# Patient Record
Sex: Female | Born: 1965 | Race: Black or African American | Hispanic: No | Smoking: Never smoker
Health system: Southern US, Community
[De-identification: ages and names within clinical notes are randomized; demographics above are authoritative.]

## PROBLEM LIST (undated history)

## (undated) DIAGNOSIS — J329 Chronic sinusitis, unspecified: Secondary | ICD-10-CM

## (undated) DIAGNOSIS — E079 Disorder of thyroid, unspecified: Secondary | ICD-10-CM

## (undated) DIAGNOSIS — K219 Gastro-esophageal reflux disease without esophagitis: Secondary | ICD-10-CM

## (undated) DIAGNOSIS — I6529 Occlusion and stenosis of unspecified carotid artery: Secondary | ICD-10-CM

## (undated) DIAGNOSIS — K449 Diaphragmatic hernia without obstruction or gangrene: Secondary | ICD-10-CM

## (undated) DIAGNOSIS — K7460 Unspecified cirrhosis of liver: Secondary | ICD-10-CM

## (undated) DIAGNOSIS — D649 Anemia, unspecified: Secondary | ICD-10-CM

## (undated) DIAGNOSIS — D571 Sickle-cell disease without crisis: Secondary | ICD-10-CM

## (undated) DIAGNOSIS — G4730 Sleep apnea, unspecified: Secondary | ICD-10-CM

## (undated) DIAGNOSIS — M1990 Unspecified osteoarthritis, unspecified site: Secondary | ICD-10-CM

## (undated) DIAGNOSIS — E559 Vitamin D deficiency, unspecified: Secondary | ICD-10-CM

## (undated) HISTORY — PX: LAPAROSCOPIC GASTRIC BAND REMOVAL WITH LAPAROSCOPIC GASTRIC SLEEVE RESECTION: SHX6498

## (undated) HISTORY — PX: ABDOMINAL HYSTERECTOMY: SHX81

## (undated) HISTORY — PX: THYROIDECTOMY: SHX17

## (undated) HISTORY — PX: LAPAROSCOPIC GASTRIC SLEEVE RESECTION: SHX5895

## (undated) HISTORY — DX: Gastro-esophageal reflux disease without esophagitis: K21.9

## (undated) HISTORY — PX: CHOLECYSTECTOMY: SHX55

## (undated) HISTORY — PX: SALPINGO OOPHORECTOMY: SHX510126

## (undated) HISTORY — DX: Sickle-cell disease without crisis: D57.1

## (undated) HISTORY — PX: LAPAROSCOPIC REVISION SLEEVE GASTRECTOMY TO GASTRIC BYPASS: SHX510386

## (undated) HISTORY — PX: LAPAROSCOPIC, GASTRECTOMY SLEEVE: SHX4495

---

## 2011-01-07 DIAGNOSIS — E0590 Thyrotoxicosis, unspecified without thyrotoxic crisis or storm: Secondary | ICD-10-CM

## 2011-01-07 HISTORY — DX: Thyrotoxicosis, unspecified without thyrotoxic crisis or storm: E05.90

## 2012-01-21 ENCOUNTER — Other Ambulatory Visit: Payer: Self-pay | Admitting: Family Medicine

## 2012-01-21 DIAGNOSIS — R1310 Dysphagia, unspecified: Secondary | ICD-10-CM

## 2012-01-28 ENCOUNTER — Ambulatory Visit
Admission: RE | Admit: 2012-01-28 | Discharge: 2012-01-28 | Disposition: A | Discharge status: Home / Self Care | Payer: BC Managed Care – PPO | Source: Ambulatory Visit | Attending: Family Medicine | Admitting: Family Medicine

## 2012-01-28 DIAGNOSIS — R1310 Dysphagia, unspecified: Secondary | ICD-10-CM

## 2012-02-09 ENCOUNTER — Encounter (HOSPITAL_BASED_OUTPATIENT_CLINIC_OR_DEPARTMENT_OTHER): Payer: Self-pay | Admitting: *Deleted

## 2012-02-09 ENCOUNTER — Emergency Department (HOSPITAL_BASED_OUTPATIENT_CLINIC_OR_DEPARTMENT_OTHER)
Admission: EM | Admit: 2012-02-09 | Discharge: 2012-02-09 | Disposition: A | Discharge status: Home / Self Care | Payer: BC Managed Care – PPO | Attending: Emergency Medicine | Admitting: Emergency Medicine

## 2012-02-09 DIAGNOSIS — R51 Headache: Secondary | ICD-10-CM | POA: Insufficient documentation

## 2012-02-09 DIAGNOSIS — Z79899 Other long term (current) drug therapy: Secondary | ICD-10-CM | POA: Insufficient documentation

## 2012-02-09 DIAGNOSIS — Z8709 Personal history of other diseases of the respiratory system: Secondary | ICD-10-CM | POA: Insufficient documentation

## 2012-02-09 DIAGNOSIS — E079 Disorder of thyroid, unspecified: Secondary | ICD-10-CM | POA: Insufficient documentation

## 2012-02-09 HISTORY — DX: Disorder of thyroid, unspecified: E07.9

## 2012-02-09 HISTORY — DX: Chronic sinusitis, unspecified: J32.9

## 2012-02-09 LAB — CBC WITH DIFFERENTIAL/PLATELET
Eosinophils Absolute: 0.1 10*3/uL (ref 0.0–0.7)
Eosinophils Relative: 1 % (ref 0–5)
Hemoglobin: 12.8 g/dL (ref 12.0–15.0)
Lymphocytes Relative: 37 % (ref 12–46)
Lymphs Abs: 2.1 10*3/uL (ref 0.7–4.0)
MCH: 30.6 pg (ref 26.0–34.0)
MCV: 90.2 fL (ref 78.0–100.0)
Monocytes Relative: 7 % (ref 3–12)
Platelets: 321 10*3/uL (ref 150–400)
RBC: 4.18 MIL/uL (ref 3.87–5.11)
WBC: 5.7 10*3/uL (ref 4.0–10.5)

## 2012-02-09 LAB — COMPREHENSIVE METABOLIC PANEL
ALT: 16 U/L (ref 0–35)
Alkaline Phosphatase: 57 U/L (ref 39–117)
BUN: 10 mg/dL (ref 6–23)
CO2: 25 mEq/L (ref 19–32)
Calcium: 8.9 mg/dL (ref 8.4–10.5)
GFR calc Af Amer: 88 mL/min — ABNORMAL LOW (ref >90)
GFR calc non Af Amer: 76 mL/min — ABNORMAL LOW (ref >90)
Glucose, Bld: 101 mg/dL — ABNORMAL HIGH (ref 70–99)
Potassium: 3.7 mEq/L (ref 3.5–5.1)
Sodium: 139 mEq/L (ref 135–145)

## 2012-02-09 MED ORDER — DEXAMETHASONE SODIUM PHOSPHATE 10 MG/ML IJ SOLN
10.0000 mg | Freq: Once | INTRAMUSCULAR | Status: AC
  Administered 2012-02-09: 10 mg via INTRAVENOUS
  Filled 2012-02-09: qty 1

## 2012-02-09 MED ORDER — METOCLOPRAMIDE HCL 5 MG/ML IJ SOLN
20.0000 mg | Freq: Once | INTRAVENOUS | Status: DC
  Filled 2012-02-09: qty 2

## 2012-02-09 MED ORDER — DIPHENHYDRAMINE HCL 50 MG/ML IJ SOLN
25.0000 mg | Freq: Once | INTRAMUSCULAR | Status: AC
  Administered 2012-02-09: 25 mg via INTRAVENOUS
  Filled 2012-02-09: qty 1

## 2012-02-09 MED ORDER — SODIUM CHLORIDE 0.9 % IV BOLUS (SEPSIS)
1000.0000 mL | Freq: Once | INTRAVENOUS | Status: AC
  Administered 2012-02-09: 1000 mL via INTRAVENOUS

## 2012-02-09 MED ORDER — METOCLOPRAMIDE HCL 5 MG/ML IJ SOLN
10.0000 mg | Freq: Once | INTRAMUSCULAR | Status: AC
  Administered 2012-02-09: 10 mg via INTRAVENOUS

## 2012-02-09 NOTE — ED Provider Notes (Signed)
History     CSN: [id number]  Arrival date & time 02/09/12  1043   First MD Initiated Contact with Patient 02/09/12 1144      Chief Complaint  Patient presents with  . Headache    (Consider location/radiation/quality/duration/timing/severity/associated sxs/prior treatment) HPI  This patient with a history of headaches, thyroid disease presents with ongoing headache.  This episode actually began several days ago, but over the past 12 hours has become severe, with no relief from aspirin, nor other OTC medication. She notes that there is a long history of headaches, in a semi-similar distribution, throbbing, left-sided, radiating from the neck superiorly.  Headaches have become more frequent, more severe since partial thyroidectomy last year. She denies confusion, disorientation, visual changes, vomiting.    Past Medical History  Diagnosis Date  . Sinusitis   . Thyroid disease     Past Surgical History  Procedure Date  . Thyroidectomy     No family history on file.  History  Substance Use Topics  . Smoking status: Never Smoker   . Smokeless tobacco: Not on file  . Alcohol Use: No    OB History    Grav Para Term Preterm Abortions TAB SAB Ect Mult Living                  Review of Systems  Constitutional:       Per HPI, otherwise negative  HENT:       Per HPI, otherwise negative  Eyes: Negative.   Respiratory:       Per HPI, otherwise negative  Cardiovascular:       Per HPI, otherwise negative  Gastrointestinal: Negative for vomiting.  Genitourinary: Negative.   Musculoskeletal:       Per HPI, otherwise negative  Skin: Negative.   Neurological: Positive for headaches. Negative for syncope and numbness.    Allergies  Vicodin  Home Medications   Current Outpatient Rx  Name  Route  Sig  Dispense  Refill  . SYNTHROID PO   Oral   Take by mouth.         Marland Kitchen PHENTERMINE HCL PO   Oral   Take by mouth.           BP 141/91  Pulse 61  Temp 98.4 F  (36.9 C) (Oral)  Resp 20  SpO2 100%  LMP 01/14/2012  Physical Exam  Nursing note and vitals reviewed. Constitutional: She is oriented to person, place, and time. She appears well-developed and well-nourished. No distress.  HENT:  Head: Normocephalic and atraumatic.       No appreciable erythema / crepitus / discharge about the neck.  Eyes: Conjunctivae normal and EOM are normal.  Cardiovascular: Normal rate and regular rhythm.   Pulmonary/Chest: Effort normal and breath sounds normal. No stridor. No respiratory distress.  Abdominal: She exhibits no distension.  Musculoskeletal: She exhibits no edema.  Neurological: She is alert and oriented to person, place, and time. She displays no atrophy and no tremor. No cranial nerve deficit. She exhibits normal muscle tone. She displays no seizure activity. Coordination normal.  Skin: Skin is warm and dry.  Psychiatric: She has a normal mood and affect.    ED Course  Procedures (including critical care time)   Labs Reviewed  CBC WITH DIFFERENTIAL  COMPREHENSIVE METABOLIC PANEL   No results found.   No diagnosis found.  1:22 PM Patient substantially better. 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  MDM  This patient presents with headache.  Notably, the patient's long history of headaches, and on exam has no neurologic dysfunction, stable vital signs.  Given her history, the significant improvement following ED interventions, she was stable for discharge.  We discussed the need for both primary care followup and the need to specifically identify what her surgical procedure was, with some consideration of ongoing hormone replacement needs that have not been met.  Gerhard Munch, MD 02/09/12 1322

## 2012-02-09 NOTE — ED Notes (Signed)
She woke with a headache at 3am. Was able to go back to sleep. 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 Hx of sinusitis.

## 2012-08-23 DIAGNOSIS — E039 Hypothyroidism, unspecified: Secondary | ICD-10-CM | POA: Insufficient documentation

## 2012-09-14 DIAGNOSIS — J309 Allergic rhinitis, unspecified: Secondary | ICD-10-CM | POA: Insufficient documentation

## 2013-08-10 ENCOUNTER — Ambulatory Visit (INDEPENDENT_AMBULATORY_CARE_PROVIDER_SITE_OTHER): Payer: BC Managed Care – PPO

## 2013-08-10 ENCOUNTER — Encounter: Payer: Self-pay | Admitting: Podiatrist

## 2013-08-10 ENCOUNTER — Ambulatory Visit (INDEPENDENT_AMBULATORY_CARE_PROVIDER_SITE_OTHER): Payer: BC Managed Care – PPO | Admitting: Podiatrist

## 2013-08-10 DIAGNOSIS — M25879 Other specified joint disorders, unspecified ankle and foot: Secondary | ICD-10-CM

## 2013-08-10 DIAGNOSIS — M7150 Other bursitis, not elsewhere classified, unspecified site: Secondary | ICD-10-CM

## 2013-08-10 DIAGNOSIS — R52 Pain, unspecified: Secondary | ICD-10-CM

## 2013-08-10 DIAGNOSIS — D219 Benign neoplasm of connective and other soft tissue, unspecified: Secondary | ICD-10-CM

## 2013-08-10 DIAGNOSIS — D3610 Benign neoplasm of peripheral nerves and autonomic nervous system, unspecified: Secondary | ICD-10-CM

## 2013-08-10 DIAGNOSIS — M21619 Bunion of unspecified foot: Secondary | ICD-10-CM

## 2013-08-10 DIAGNOSIS — Q6650 Congenital pes planus, unspecified foot: Secondary | ICD-10-CM

## 2013-08-10 DIAGNOSIS — G5750 Tarsal tunnel syndrome, unspecified lower limb: Secondary | ICD-10-CM

## 2013-08-10 DIAGNOSIS — G5752 Tarsal tunnel syndrome, left lower limb: Secondary | ICD-10-CM

## 2013-08-10 MED ORDER — TRIAMCINOLONE ACETONIDE 10 MG/ML IJ SUSP
10.0000 mg | Freq: Once | INTRAMUSCULAR | Status: AC
  Administered 2013-08-10: 10 mg

## 2013-08-10 NOTE — Progress Notes (Signed)
   Subjective:    Patient ID: Lisa Hughes, female    DOB: 09-08-1965, 48 y.o.   MRN: [id number]  HPI my left foot has been bothering me for about 8 years and is sore and comes and goes and walks a lot and runs about 5-7 miles and hurts on the ball and pain going up leg and throbbing and tingling  Patient also complains of pain submetatarsal 5 of the left foot., Generalized numbness in the foot and going up the left leg.    Review of Systems  HENT: Positive for sinus pressure.   Cardiovascular:       Poor circulation and calf pain  Skin:       Change in nails   Neurological: Positive for headaches.  All other systems reviewed and are negative.      Objective:   Physical Exam Patient is awake, alert, and oriented x 3.  In no acute distress.  Vascular status is intact with palpable pedal pulses at 2/4 DP and PT bilateral and capillary refill time within normal limits. Neurological sensation is also intact bilaterally via Semmes Weinstein monofilament at 5/5 sites. Light touch, vibratory sensation, Achilles tendon reflex is intact. Numbness and tingling are subjectively reported on the dorsal lateral aspect of the left foot and radiating up into the anterior leg. Dermatological exam reveals a palpable cyst/bursa on the plantar aspect of the left fifth metatarsal head.  Musculature intact with dorsiflexion, plantarflexion, inversion, eversion. 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      Assessment & Plan:  Cyst, bursa submetatarsal 5 left, neuritis, pes planovalgus  Plan: Injected the cyst with Kenalog and Marcaine plain. Recommended orthotics and she was scanned at today's visit. She will call if she continues to have pain from the cyst. If the orthotics don't help with the neuritis type pain we'll consider I nerve conduction study or a referral to Dr. Jacklynn Ganong

## 2013-08-10 NOTE — Patient Instructions (Signed)
Bursitis Bursitis is when the fluid-filled sac (bursa) that covers and protects a joint gets puffy and irritated. HOME CARE  Put ice on the area.  Put ice in a plastic bag.  Place a towel between your skin and the bag.  Leave the ice on for 15-20 minutes, 03-04 times a day.  Put the joint through a full range of motion 4 times a day. Rest the injured joint at other times. When you have less pain, begin slow movements and usual activities.  Only take medicine as told by your doctor.  Follow up with your doctor. Any delay in care could stop the bursitis from healing. This could cause long-term pain. GET HELP RIGHT AWAY IF:   You have more pain with treatment.  You have a temperature by mouth above 102 F (38.9 C), not controlled by medicine.  You have heat and irritation over the fluid-filled sac. MAKE SURE YOU:   Understand these instructions.  Will watch your condition.  Will get help right away if you are not doing well or get worse. Document Released: 06/12/2009 Document Revised: 03/17/2011 Document Reviewed: 03/14/2013 [facility] Patient Information 2015 [place], [place]. This information is not intended to replace advice given to you by your health care provider. Make sure you discuss any questions you have with your health care provider.   Tarsal Tunnel Syndrome with Rehab Tarsal tunnel syndrome is a condition that involves pressure (compression) on the nerve in the ankle (posterior tibial nerve) and results in pain and loss of feeling on the bottom of the foot. The nerve is usually compressed by other structures within the ankle. SYMPTOMS   Signs of nerve damage: pain, numbness, tingling, and loss of feeling along the bottom of the foot.  Pain that worsens with activity.  Feeling a lack of stability in the ankle. CAUSES  Tarsal tunnel syndrome is caused by structures within the ankle placing pressure on the nerve inside the ankle, which causes sensations in the  bottom of the foot. Common sources of pressure include:  Ligament-like tissue (retinaculum) that covers the nerve area in the ankle (tarsal tunnel).  Bony spurs or bumps.  Inflamed tendons (tendonitis). RISK INCREASES WITH:  Stretched ankle ligaments, which create a loose joint.  Flat feet.  Arthritis of the ankle.  Inflammation of tendons in the foot and ankle.  Previous foot or ankle injury. PREVENTION  Warm up and stretch properly before activity.  Maintain physical fitness:  Strength, flexibility, and endurance.  Cardiovascular fitness (increases heart rate).  Wear properly fitted shoes.  Wear arch supports (orthotics), if you have flat feet.  Protect the ankle with taping, braces, or compression bandages. PROGNOSIS  If treated properly, the symptoms of tarsal tunnel syndrome usually go away with non-surgical treatment. Occasionally, surgery is necessary to free the compressed nerve.  RELATED COMPLICATIONS  Permanent nerve damage, including pain, numbness, tingling, or weakness in the ankle. TREATMENT Treatment first involves resting from any activities that aggravate the symptoms, as well as the use of ice and medicine to reduce pain and inflammation. The use of strengthening and stretching exercises may help reduce pain from activity. Other treatments include wearing arch supports, if you have flat feet, and cross training (training in various physical activities) to reduce stress on the foot and ankle. If symptoms persist, despite non-surgical treatment, then surgery may be recommended. Surgery usually provides full relief from symptoms.  MEDICATION   If pain medicine is necessary, nonsteroidal anti-inflammatory medicines (aspirin and ibuprofen), or other minor  pain relievers (acetaminophen), are often recommended.  Do not take pain medication for 7 days before surgery.  Prescription pain relievers may be given if your caregiver thinks they are necessary. Use only  as directed and only as much as you need. HEAT AND COLD  Cold treatment (icing) relieves pain and reduces inflammation. Cold treatment should be applied for 10 to 15 minutes every 2 to 3 hours, and immediately after any activity that aggravates your symptoms. Use ice packs or an ice massage.  Heat treatment may be used prior to performing stretching and strengthening activities prescribed by your caregiver, physical therapist, or athletic trainer. Use a heat pack or a warm water soak. SEEK MEDICAL CARE IF:  Treatment does not seem to help, or the condition worsens.  Any medicines produce negative side effects.  Any complications from surgery occur:  Pain, numbness, or coldness in the affected foot.  Discoloration beneath the toenails (blue or gray) of the affected foot.  Signs of infections (fever, pain, inflammation, redness, or persistent bleeding). EXERCISES RANGE OF MOTION (ROM) AND STRETCHING EXERCISES - Tarsal Tunnel Syndrome (Posterior Tibial Nerve Compression) These exercises may help you when beginning to restore activity to your injured foot. Complete these exercises with caution. Nerves are very sensitive tissue. They must be exercised gently. Never force a motion and do not push through discomfort. Notify your caregiver of any exercises which increase your pain or worsen your symptoms. Your symptoms may go away with or without further involvement from your physician, physical therapist or athletic trainer. While completing these exercises, remember:   Restoring tissue flexibility helps normal motion to return to the joints. This allows healthier, less painful movement and activity.  An effective stretch should be held for at least 30 seconds.  A stretch should never be painful. You should only feel a gentle lengthening or release in the stretched tissue. STRETCH - Gastroc, Standing  Place hands on wall.  Extend right / left leg behind you and place a folded washcloth under  the arch of your foot for support. Keep the front knee somewhat bent.  Slightly point your toes inward on your back foot.  Keeping your right / left heel on the floor and your knee straight, shift your weight toward the wall, not allowing your back to arch.  You should feel a gentle stretch in the right / left calf. Hold this position for __________ seconds. Repeat __________ times. Complete this stretch __________ times per day. STRETCH - Soleus, Standing  Place hands on wall.  Extend right / left leg behind you and place a folded washcloth under the arch of your foot for support. Keep the front knee somewhat bent.  Slightly point your toes inward on your back foot.  Keep your right / left heel on the floor, bend your back knee, and slightly shift your weight over the back leg so that you feel a gentle stretch deep in your back calf.  Hold this position for __________ seconds. Repeat __________ times. Complete this stretch __________ times per day. RANGE OF MOTION - Toe Extension, Flexion  Sit with your right / left leg crossed over your opposite knee.  Grasp your toes and gently pull them back toward the top of your foot. You should feel a stretch on the bottom of your toes and foot.  Hold this stretch for __________ seconds.  Now, gently pull your toes toward the bottom of your foot. You should feel a stretch on the top of your toes  and foot.  Hold this stretch for __________ seconds. Repeat __________ times. Complete this stretch __________ times per day.  RANGE OF MOTION - Ankle Eversion  Sit with your right / left ankle crossed over your opposite knee.  Grip your foot with your opposite hand, placing your thumb on the top of your foot and your fingers across the bottom of your foot.  Gently push your foot downward with a slight rotation so your littlest toes rise slightly toward the ceiling.  You should feel a gentle stretch on the inside of your ankle. Hold the stretch  for __________ seconds. Repeat __________ times. Complete this exercise __________ times per day.  RANGE OF MOTION - Ankle Dorsiflexion, Active Assisted  Remove shoes and sit on a chair, preferably not on a carpeted surface.  Place right / left foot on the floor, directly under knee. Extend your opposite leg for support.  Keeping your heel down, slide your right / left foot back toward the chair until you feel a stretch at your ankle or calf. If you do not feel a stretch, slide your bottom forward to the edge of the chair while still keeping your heel down.  Hold this stretch for __________ seconds. Repeat __________ times. Complete this stretch __________ times per day.  STRETCH - Hamstrings, Supine  Lie on your back. Loop a belt or towel over the ball of your right / left foot.  Straighten your right / left knee and slowly pull on the belt to raise your leg. Do not allow the right / left knee to bend. Keep your opposite leg flat on the floor.  Raise the leg until you feel a gentle stretch behind your right / left knee or thigh. Hold this position for __________ seconds. Repeat __________ times. Complete this stretch __________ times per day.  STRETCH - Hamstrings, Doorway  Lie on your back with your right / left leg extended and resting on the wall and the opposite leg flat on the ground through the door. Initially, position your bottom farther away from the wall than the illustration shows.  Keep your right / left knee straight. If you feel a stretch behind your knee or thigh, hold this position for __________ seconds.  If you do not feel a stretch, scoot your bottom closer to the door and hold __________ seconds. Repeat __________ times. Complete this stretch __________ times per day.  STRETCH - Hamstrings, Standing  Stand or sit, and extend your right / left leg, placing your foot on a chair or foot stool  Keep a slight arch in your low back, and keep your hips straight  forward.  Lead with your chest and lean forward at the waist, until you feel a gentle stretch in the back of your right / left knee or thigh. (When done correctly, this exercise requires leaning only a small distance.)  Hold this position for __________ seconds. Repeat __________ times. Complete this stretch __________ times per day. STRENGTHENING EXERCISES - Tarsal Tunnel Syndrome (Posterior Tibial Nerve Compression) These exercises may help you when beginning to restore activity to your injured foot. Your symptoms may go away with or without further involvement from your physician, physical therapist or athletic trainer. While completing these exercises, remember:   Muscles can gain both the endurance and the strength needed for everyday activities through controlled exercises.  Complete these exercises as instructed by your physician, physical therapist or athletic trainer. Increase the resistance and repetitions only as guided.  You may experience  muscle soreness or fatigue, but the pain or discomfort you are trying to eliminate should never worsen during these exercises. If this pain does worsen, stop and make certain you are following the directions exactly. If the pain is still present after adjustments, discontinue the exercise until you can discuss the trouble with your caregiver. STRENGTH - Dorsiflexors  Secure a rubber exercise band or tubing to a fixed object (table, pole) and loop the other end around your right / left foot.  Sit on the floor facing the fixed object. The band should be slightly tense when your foot is relaxed.  Slowly draw your foot back toward you, using your ankle and toes.  Hold this position for __________ seconds. Slowly release the tension in the band and return your foot to the starting position. Repeat __________ times. Complete this exercise __________ times per day.  STRENGTH - Plantar-flexors  Sit with your right / left leg extended. Holding onto  both ends of a rubber exercise band or tubing, loop it around the ball of your foot. Keep a slight tension in the band.  Slowly push your toes away from you, pointing them downward.  Hold this position for __________ seconds. Return to the starting position slowly, controlling the tension in the band. Repeat __________ times. Complete this exercise __________ times per day.  STRENGTH - Plantar-flexors, Standing  Stand with your feet shoulder width apart. Place your hands on a wall or table to steady yourself, using as little support as needed.  Keeping your weight evenly spread over the width of your feet, rise up on your toes.*  Hold this position for __________ seconds. Repeat __________ times. Complete this exercise __________ times per day.  *If this is too easy, shift your weight toward your right / left leg until you feel challenged. Ultimately, you may be asked to do this exercise while standing on your right / left foot only. STRENGTH - Towel Curls  Sit in a chair, on a non-carpeted surface.  Place your foot on a towel, keeping your heel on the floor.  Pull the towel toward your heel only by curling your toes. Keep your heel on the floor.  If instructed by your physician, physical therapist or athletic trainer, add ____________________ at the end of the towel. Repeat __________ times. Complete this exercise __________ times per day. STRENGTH - Ankle Eversion  Secure one end of a rubber exercise band or tubing to a fixed object (table, pole). Loop the other end around your foot, just before your toes.  Place your fists between your knees. This will focus your strengthening at your ankle.  Drawing the band across your opposite foot, away from the pole, slowly pull your little toe out and up. Make sure the band is positioned to resist the entire motion.  Hold this position for __________ seconds.  Return to the starting position slowly, controlling the tension in the  band. Repeat __________ times. Complete this exercise __________ times per day.  STRENGTH - Ankle Inversion  Secure one end of a rubber exercise band or tubing to a fixed object (table, pole). Loop the other end around your foot, just before your toes.  Place your fists between your knees. This will focus your strengthening at your ankle.  Slowly, pull your big toe up and in, making sure the band is positioned to resist the entire motion.  Hold this position for __________ seconds.  Return to the starting position slowly, controlling the tension in the band. Repeat  __________ times. Complete this exercises __________ times per day.  Document Released: 12/23/2004 Document Revised: 03/17/2011 Document Reviewed: 04/06/2008 [facility] Patient Information 2015 [place], [place]. This information is not intended to replace advice given to you by your health care provider. Make sure you discuss any questions you have with your health care provider.

## 2013-09-02 ENCOUNTER — Other Ambulatory Visit: Payer: BC Managed Care – PPO

## 2013-09-09 ENCOUNTER — Ambulatory Visit: Payer: BC Managed Care – PPO

## 2013-09-09 DIAGNOSIS — M722 Plantar fascial fibromatosis: Secondary | ICD-10-CM

## 2013-09-09 DIAGNOSIS — M7150 Other bursitis, not elsewhere classified, unspecified site: Secondary | ICD-10-CM

## 2013-09-09 NOTE — Progress Notes (Signed)
Pt is here to [facility] 

## 2013-09-09 NOTE — Patient Instructions (Addendum)

## 2013-10-26 ENCOUNTER — Ambulatory Visit (INDEPENDENT_AMBULATORY_CARE_PROVIDER_SITE_OTHER): Payer: BC Managed Care – PPO | Admitting: Podiatrist

## 2013-10-26 ENCOUNTER — Encounter: Payer: Self-pay | Admitting: Podiatrist

## 2013-10-26 VITALS — BP 142/89 | HR 64 | Resp 12

## 2013-10-26 DIAGNOSIS — M67479 Ganglion, unspecified ankle and foot: Secondary | ICD-10-CM

## 2013-10-26 DIAGNOSIS — M71372 Other bursal cyst, left ankle and foot: Secondary | ICD-10-CM

## 2013-10-26 MED ORDER — KETOCONAZOLE 2 % EX CREA: 1.0000 "application " | TOPICAL_CREAM | Freq: Two times a day (BID) | CUTANEOUS | Status: DC

## 2013-10-26 NOTE — Patient Instructions (Signed)

## 2013-10-27 NOTE — Progress Notes (Signed)
Subjective: Patient presents today for continued pain submetatarsal 5 left foot due to a cyst. She states that the last injection was of no benefit and that the cyst continues to get larger. She relates it's painful in shoes.  Objective: Neurovascular status intact and unchanged. Palpable cyst approximately dime-sized on the plantar aspect of the fifth metatarsal head is palpated. Pain with direct pressure is noted.  Assessment: Cyst plantar aspect left fifth metatarsal head  Plan: Discussed conservative versus surgical excision of the cyst. Agreed to try 1 more injection into the cyst itself to try and shrink it down. Discussed if this is not beneficial would recommend removal of the cyst as an outpatient procedure. She will call if the cyst continues to bother her and we will consider surgery at that time.

## 2013-11-01 IMAGING — RF DG ESOPHAGUS
11 series · 19 of 19 positions shown · non-contrast
Comparison: None.

CLINICAL DATA: Difficulty swallowing and pain after recent surgery.

ESOPHOGRAM/BARIUM SWALLOW
TECHNIQUE: Combined double contrast and single contrast
examination performed using effervescent crystals, thick barium
liquid, and thin barium liquid.
Fluoroscopy time:  1.0 minutes.

[Series 1: run · 9 of 9 slices shown (1 of 11)]
[im 1/9]
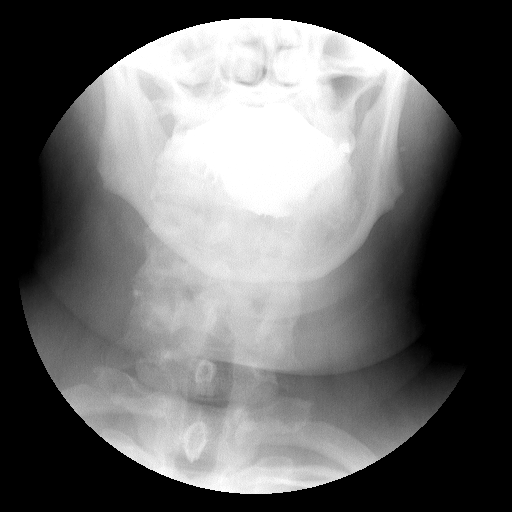
[im 2/9]
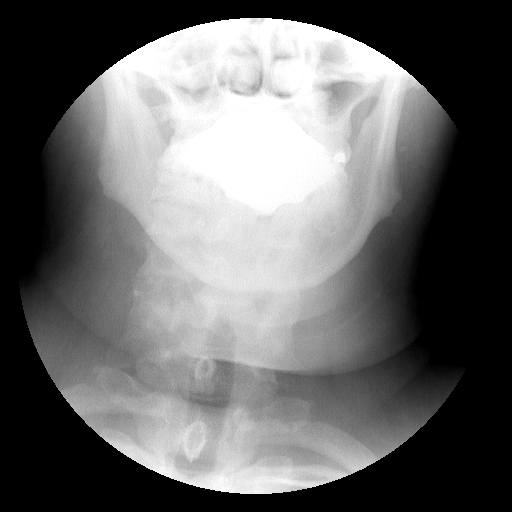
[im 3/9]
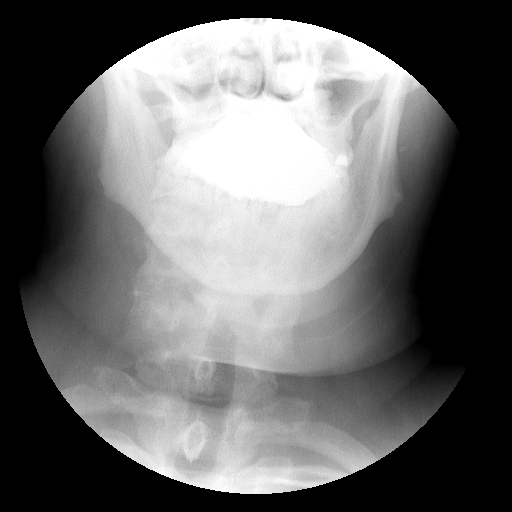
[im 4/9]
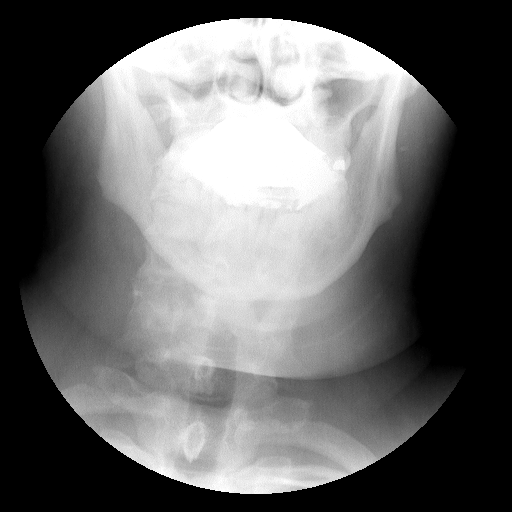
[im 5/9]
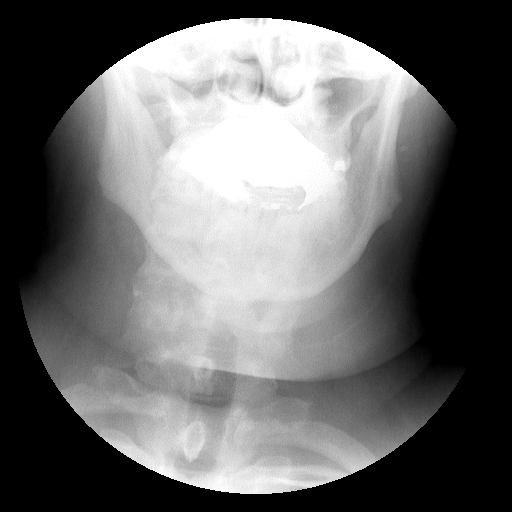
[im 6/9]
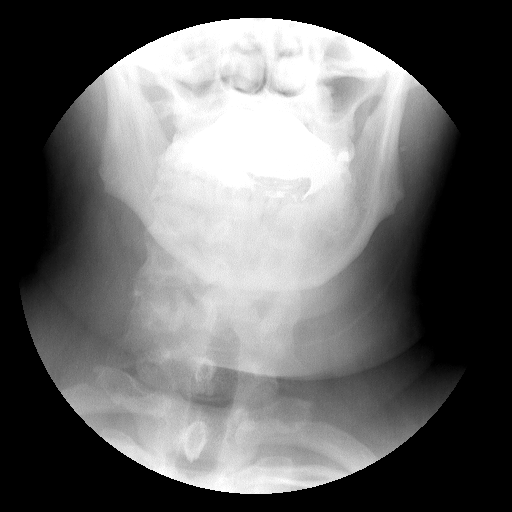
[im 7/9]
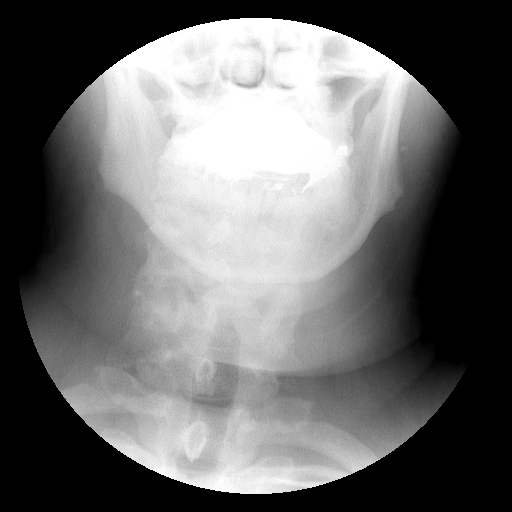
[im 8/9]
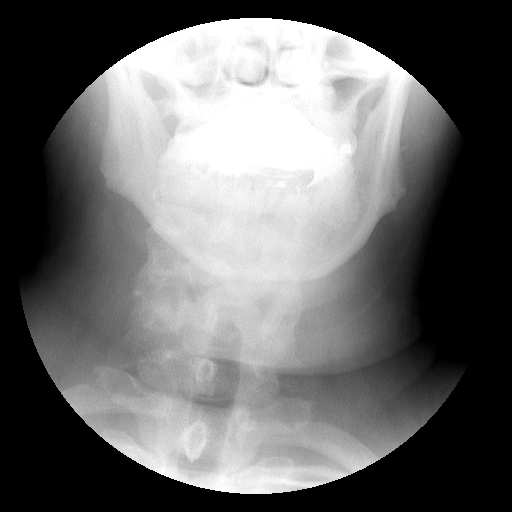
[im 9/9]
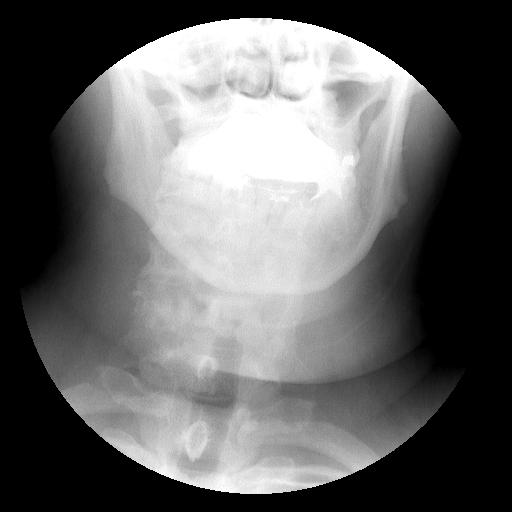

[Series 2: run · 1 of 1 slices shown (2 of 11)]
[im 1/1]
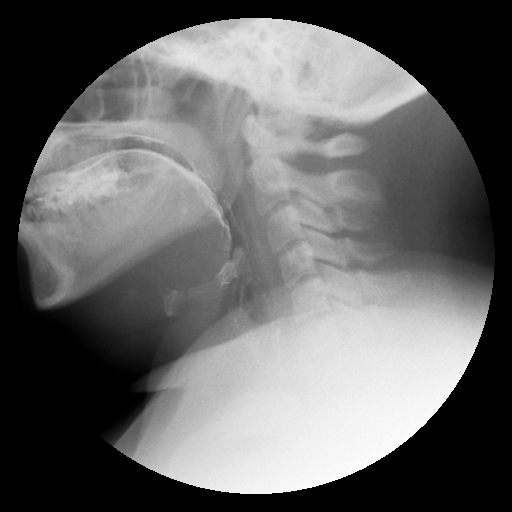

[Series 3: run · 1 of 1 slices shown (3 of 11)]
[im 1/1]
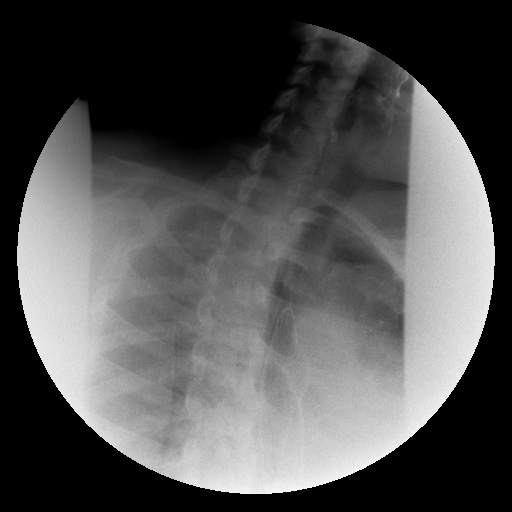

[Series 4: run · 1 of 1 slices shown (4 of 11)]
[im 1/1]
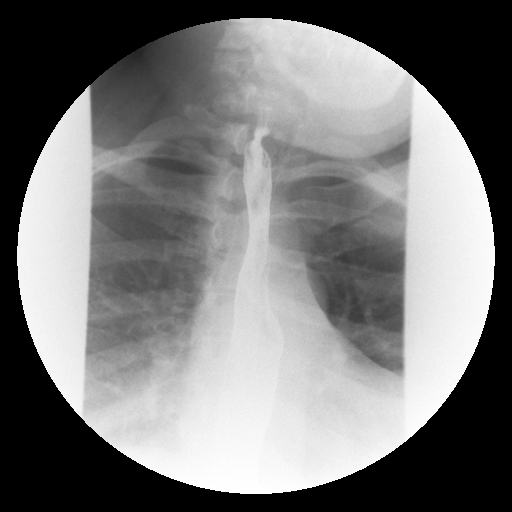

[Series 5: run · 1 of 1 slices shown (5 of 11)]
[im 1/1]
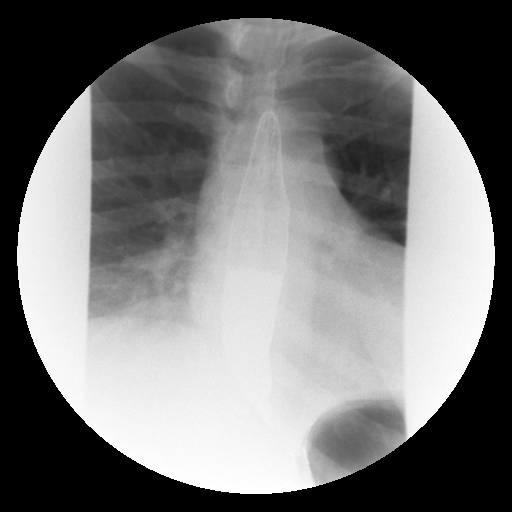

[Series 6: run · 1 of 1 slices shown (6 of 11)]
[im 1/1]
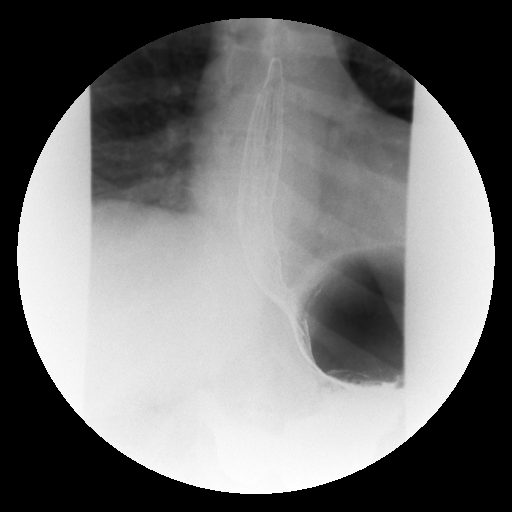

[Series 7: run · 1 of 1 slices shown (7 of 11)]
[im 1/1]
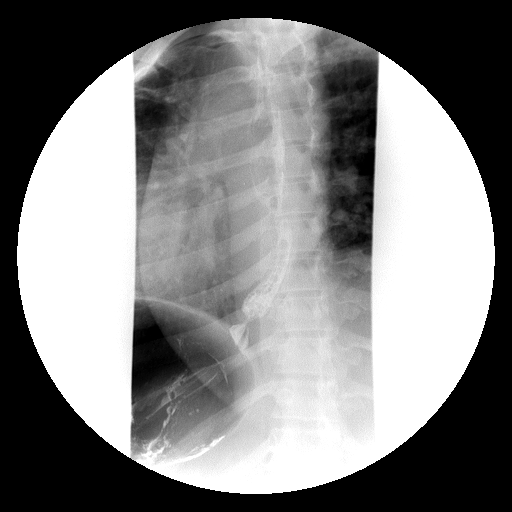

[Series 8: run · 1 of 1 slices shown (8 of 11)]
[im 1/1]
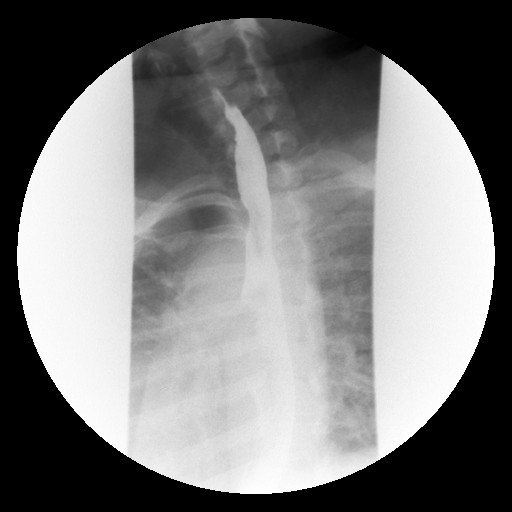

[Series 9: run · 1 of 1 slices shown (9 of 11)]
[im 1/1]
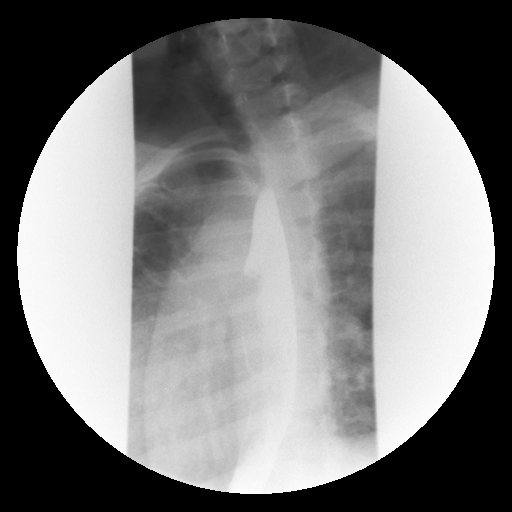

[Series 10: run · 1 of 1 slices shown (10 of 11)]
[im 1/1]
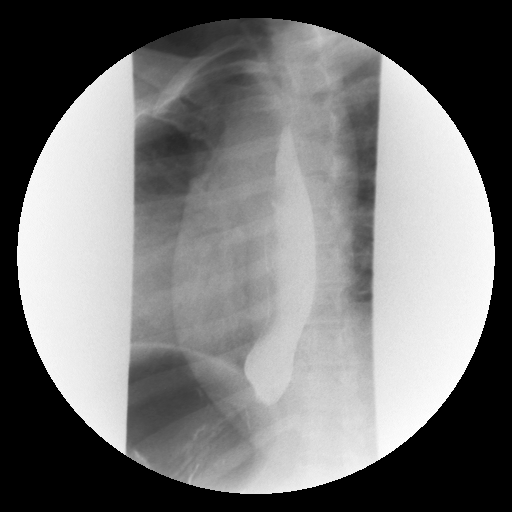

[Series 11: run · 1 of 1 slices shown (11 of 11)]
[im 1/1]
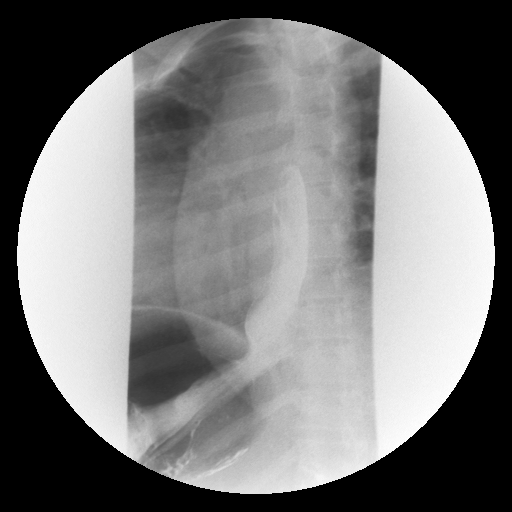

[19 of 19 positions shown; findings below may reference images not displayed]

FINDINGS: Real-time examination of swallowing was unremarkable
except for poor initiation of swallowing.  Esophageal motility is
normal.  No esophageal fold thickening, stricture or obstruction.
A 13 mm barium pill passed into the stomach without difficulty.
IMPRESSION: Poor initiation of swallowing.  Otherwise, negative exam.

## 2013-12-24 ENCOUNTER — Emergency Department (HOSPITAL_BASED_OUTPATIENT_CLINIC_OR_DEPARTMENT_OTHER)
Admission: EM | Admit: 2013-12-24 | Discharge: 2013-12-24 | Disposition: A | Discharge status: Home / Self Care | Payer: BC Managed Care – PPO | Attending: Emergency Medicine | Admitting: Emergency Medicine

## 2013-12-24 ENCOUNTER — Encounter (HOSPITAL_BASED_OUTPATIENT_CLINIC_OR_DEPARTMENT_OTHER): Payer: Self-pay | Admitting: *Deleted

## 2013-12-24 DIAGNOSIS — Z8719 Personal history of other diseases of the digestive system: Secondary | ICD-10-CM | POA: Diagnosis not present

## 2013-12-24 DIAGNOSIS — E079 Disorder of thyroid, unspecified: Secondary | ICD-10-CM | POA: Insufficient documentation

## 2013-12-24 DIAGNOSIS — Z8709 Personal history of other diseases of the respiratory system: Secondary | ICD-10-CM | POA: Insufficient documentation

## 2013-12-24 DIAGNOSIS — R519 Headache, unspecified: Secondary | ICD-10-CM

## 2013-12-24 DIAGNOSIS — R51 Headache: Secondary | ICD-10-CM | POA: Diagnosis not present

## 2013-12-24 MED ORDER — OXYCODONE-ACETAMINOPHEN 5-325 MG PO TABS: 1.0000 | ORAL_TABLET | ORAL | Status: DC | PRN

## 2013-12-24 MED ORDER — OXYCODONE-ACETAMINOPHEN 5-325 MG PO TABS
2.0000 | ORAL_TABLET | Freq: Once | ORAL | Status: AC
Start: 2013-12-24 — End: 2013-12-24
  Administered 2013-12-24: 2 via ORAL
  Filled 2013-12-24: qty 2

## 2013-12-24 NOTE — ED Provider Notes (Signed)
CSN: [id number]     Arrival date & time 12/24/13  1123 History   First MD Initiated Contact with Patient 12/24/13 1259     Chief Complaint  Patient presents with  . Headache     (Consider location/radiation/quality/duration/timing/severity/associated sxs/prior Treatment) HPI  48 year old female who presents today complaining of headache. She states that she has had headaches for extended period of time but they had resolved for a while and came back about 3 years ago. Today the past headache started while she was sleeping and was pounding in nature and severe. She has had a similar headache approximately year ago and sought evaluation that time. She states that normally her headaches resolve with over-the-counter pain medicine. Today she did not take any. She states that she had some low-grade fever with some sinus pressure yesterday. She denies any sinus discharge. She states when the headaches occur they're pounding and severe and she is unable to move due to the severe pain. She states this occurred last night and she was unable to move during the night but then was able to get up and come to the emergency department. She denies any lateralized weakness or changes in vision. She has not by been seen by neurology or evaluated for headaches except for ED visits in the past.  Past Medical History  Diagnosis Date  . Sinusitis   . Thyroid disease   . GERD (gastroesophageal reflux disease)    Past Surgical History  Procedure Laterality Date  . Thyroidectomy    . Abdominal hysterectomy    . Laparoscopic gastric sleeve resection     History reviewed. No pertinent family history. History  Substance Use Topics  . Smoking status: Never Smoker   . Smokeless tobacco: Not on file  . Alcohol Use: No   OB History    No data available     Review of Systems  All other systems reviewed and are negative.     Allergies  Vicodin  Home Medications   Prior to Admission medications    Medication Sig Start Date End Date Taking? Authorizing Provider  Levothyroxine Sodium (SYNTHROID PO) Take by mouth.    Historical Provider, MD  Phentermine-Topiramate (QSYMIA PO) Take by mouth.    Historical Provider, MD   BP 140/91 mmHg  Pulse 55  Temp(Src) 98.1 F (36.7 C) (Oral)  Resp 18  Ht 5\' 2"  (1.575 m)  Wt 180 lb (81.647 kg)  BMI 32.91 kg/m2  SpO2 100%  LMP 08/24/2013 Physical Exam  Constitutional: She is oriented to person, place, and time. She appears well-developed and well-nourished.  HENT:  Head: Normocephalic and atraumatic.  Right Ear: Tympanic membrane and external ear normal.  Left Ear: Tympanic membrane and external ear normal.  Nose: Nose normal. Right sinus exhibits no maxillary sinus tenderness and no frontal sinus tenderness. Left sinus exhibits no maxillary sinus tenderness and no frontal sinus tenderness.  Eyes: Conjunctivae and EOM are normal. Pupils are equal, round, and reactive to light. Right eye exhibits no nystagmus. Left eye exhibits no nystagmus.  Neck: Normal range of motion. Neck supple.  Cardiovascular: Normal rate, regular rhythm, normal heart sounds and intact distal pulses.   Pulmonary/Chest: Effort normal and breath sounds normal. No respiratory distress. She exhibits no tenderness.  Abdominal: Soft. Bowel sounds are normal. She exhibits no distension and no mass. There is no tenderness.  Musculoskeletal: Normal range of motion. She exhibits no edema or tenderness.  Neurological: She is alert and oriented to person, place, and time.  She has normal strength and normal reflexes. No sensory deficit. She displays a negative Romberg sign. GCS eye subscore is 4. GCS verbal subscore is 5. GCS motor subscore is 6.  Reflex Scores:      Tricep reflexes are 2+ on the right side and 2+ on the left side.      Bicep reflexes are 2+ on the right side and 2+ on the left side.      Brachioradialis reflexes are 2+ on the right side and 2+ on the left side.       Patellar reflexes are 2+ on the right side and 2+ on the left side.      Achilles reflexes are 2+ on the right side and 2+ on the left side. Patient with normal gait without ataxia, shuffling, spasm, or antalgia. Speech is normal without dysarthria, dysphasia, or aphasia. Muscle strength is 5/5 in bilateral shoulders, elbow flexor and extensors, wrist flexor and extensors, and intrinsic hand muscles. 5/5 bilateral lower extremity hip flexors, extensors, knee flexors and extensors, and ankle dorsi and plantar flexors.    Skin: Skin is warm and dry. No rash noted.  Psychiatric: She has a normal mood and affect. Her behavior is normal. Judgment and thought content normal.  Nursing note and vitals reviewed.   ED Course  Procedures (including critical care time) Labs Review Labs Reviewed - No data to display  Imaging Review No results found.   EKG Interpretation None      MDM   Final diagnoses:  Chronic nonintractable headache, unspecified headache type    Patient with headache which has been present intermittently for several years. Today is not worse than baseline. The patient risks benefits of CAT scan versus referral to neurology and orobable follow-up MRI. Patient will be given pain medicine here and given referral for neurology.  Shaune Pollack, MD 12/24/13 [phone]

## 2013-12-24 NOTE — ED Notes (Signed)
Pt reports HA since yesterday.  Hx of same.  Deneis N/V

## 2013-12-24 NOTE — Discharge Instructions (Signed)

## 2013-12-24 NOTE — ED Notes (Signed)
Pt given rx x 1 for percocet- pt has a ride

## 2014-04-19 ENCOUNTER — Ambulatory Visit (INDEPENDENT_AMBULATORY_CARE_PROVIDER_SITE_OTHER): Payer: BLUE CROSS/BLUE SHIELD | Admitting: Podiatrist

## 2014-04-19 ENCOUNTER — Ambulatory Visit: Payer: Self-pay

## 2014-04-19 ENCOUNTER — Encounter: Payer: Self-pay | Admitting: Podiatrist

## 2014-04-19 VITALS — BP 142/93 | HR 62 | Resp 12 | Ht 62.5 in | Wt 176.0 lb

## 2014-04-19 DIAGNOSIS — M79672 Pain in left foot: Secondary | ICD-10-CM

## 2014-04-19 DIAGNOSIS — M71372 Other bursal cyst, left ankle and foot: Secondary | ICD-10-CM

## 2014-04-19 NOTE — Progress Notes (Signed)
   Chief Complaint  Patient presents with  . Foot Pain    "My left foot is irritated to the point that it hurts and is red." left 5th sub MPJ plantar.  Pt states she was injected previously, but reoccurred more painful in the last 1.5 month.     Objective: Neurovascular status intact and unchanged. Pain with pressure on the plantar fifth metatarsal head is noted.  Skin discoloration in this area is also noted.  Mild redness is present.  No breakdown in skin is present.  No sign of infection noted.   Assessment: probable cyst plantar aspect left fifth metatarsal head  Plan: recommended an mri of the left foot to identify the cyst.  Will order the mri to plan for surgical excision if indicated.

## 2014-04-19 NOTE — Patient Instructions (Signed)
Will order an mri through [facility]-- will call as soon as it comes back to decide on surgical planning.

## 2014-04-19 NOTE — Progress Notes (Deleted)
   Subjective:    Patient ID: Lisa Hughes, female    DOB: [birth year], 49 y.o.   MRN: [id number]  HPI    Review of Systems  All other systems reviewed and are negative.      Objective:   Physical Exam        Assessment & Plan:

## 2014-04-20 ENCOUNTER — Other Ambulatory Visit: Payer: Self-pay | Admitting: Podiatrist

## 2014-04-20 ENCOUNTER — Other Ambulatory Visit: Payer: Self-pay | Admitting: *Deleted

## 2014-04-20 DIAGNOSIS — M71372 Other bursal cyst, left ankle and foot: Secondary | ICD-10-CM

## 2014-05-09 ENCOUNTER — Telehealth: Payer: Self-pay | Admitting: *Deleted

## 2014-05-09 NOTE — Telephone Encounter (Signed)
"  I received another order for patient's MRI.  If you look at appointment desk under orders you will see my notes.  Patient got a second opinion and decided MRI is not needed."  Okay, thank you.

## 2014-05-10 NOTE — Telephone Encounter (Signed)
gotcha-- thank you!

## 2015-01-07 DIAGNOSIS — K760 Fatty (change of) liver, not elsewhere classified: Secondary | ICD-10-CM

## 2015-01-07 HISTORY — DX: Fatty (change of) liver, not elsewhere classified: K76.0

## 2016-02-05 ENCOUNTER — Other Ambulatory Visit: Payer: Self-pay | Admitting: Family Medicine

## 2016-02-05 DIAGNOSIS — R748 Abnormal levels of other serum enzymes: Secondary | ICD-10-CM

## 2016-02-08 ENCOUNTER — Ambulatory Visit
Admission: RE | Admit: 2016-02-08 | Discharge: 2016-02-08 | Disposition: A | Discharge status: Home / Self Care | Payer: BLUE CROSS/BLUE SHIELD | Source: Ambulatory Visit | Attending: Family Medicine | Admitting: Family Medicine

## 2016-02-08 DIAGNOSIS — K760 Fatty (change of) liver, not elsewhere classified: Secondary | ICD-10-CM | POA: Insufficient documentation

## 2016-02-08 DIAGNOSIS — R748 Abnormal levels of other serum enzymes: Secondary | ICD-10-CM | POA: Diagnosis present

## 2016-02-11 ENCOUNTER — Ambulatory Visit: Payer: Self-pay

## 2016-03-07 ENCOUNTER — Ambulatory Visit: Payer: Self-pay

## 2016-03-10 ENCOUNTER — Ambulatory Visit: Payer: Self-pay

## 2016-03-17 ENCOUNTER — Encounter: Payer: BLUE CROSS/BLUE SHIELD | Admitting: Psychology

## 2016-03-27 ENCOUNTER — Encounter: Payer: BLUE CROSS/BLUE SHIELD | Attending: Psychology | Admitting: Psychology

## 2016-04-24 ENCOUNTER — Telehealth: Payer: Self-pay | Admitting: *Deleted

## 2016-04-24 NOTE — Telephone Encounter (Signed)
Pt states she stumped her toe and the toenail is coming off, and is scheduled for 04/28/2016. I spoke with pt and she wanted to know how to care for the toe until seen, she thought she may have a fungal infection and may begin to use the OTC antifungal, and kind of remembers an injury, but can't understand why the nail came off otherwise. Pt states she cut the nail back as far as she could. I told pt to hold off on a fungal treatment until established she has a fungus and some of the fungal preps can be irritating to the skin. Pt states she can't stand to have cold feet. I told pt that if there was bleeding she could perform espom salt 1/2 C to 1 qt of warm water daily and cover area with a lightly coated antibiotic ointment daily until seen, if no bleeding could cover with dry bandaid, wear open toed shoes with socks if concerned with cold or loose fitting shoes.

## 2016-04-28 ENCOUNTER — Ambulatory Visit: Payer: BLUE CROSS/BLUE SHIELD | Admitting: Podiatry

## 2016-04-28 ENCOUNTER — Ambulatory Visit (INDEPENDENT_AMBULATORY_CARE_PROVIDER_SITE_OTHER): Payer: BLUE CROSS/BLUE SHIELD | Admitting: Podiatry

## 2016-04-28 DIAGNOSIS — B351 Tinea unguium: Secondary | ICD-10-CM

## 2016-04-28 DIAGNOSIS — L601 Onycholysis: Secondary | ICD-10-CM

## 2016-05-01 NOTE — Progress Notes (Signed)
Subjective: 51 year old female presents the office today for concerns of her left big toenail partially coming off. She states that she is not having any pain, drainage or pus or any redness. She is out of the area checked however. She's never had any treatment for toenail fungus. She states that before the nail was not loose but she did head and part of the nail came off. She said no recent treatment otherwise. Denies any systemic complaints such as fevers, chills, nausea, vomiting. No acute changes since last appointment, and no other complaints at this time.   Objective: AAO x3, NAD DP/PT pulses palpable bilaterally, CRT less than 3 seconds Nails hypertrophic, dystrophic, discolored with yellow to [place] discoloration to all the nails. The digit toenails are the worst. The left hallux toenail is partially present on the partial nail border and his family adhered to the underlying nail bed. There is no swelling edema, erythema, drainage or pus. This note clinical signs of infection. There is no pain. No open lesions or pre-ulcerative lesions.  No pain with calf compression, swelling, warmth, erythema  Assessment: Left hallux onycholysis, onychomycosis  Plan: -All treatment options discussed with the patient including all alternatives, risks, complications.  -This time the left hallux is doing well and there is no signs of infection there is no pain. The toenails, but the remainder the nail is firmly adhered. We'll continue to monitor. As is not painful infected we will not remove the remainder the nail. Given the nail fungus discussed the treatment options and she is interested about this. After discussion we elected to proceed with topical treatment. I ordered a topical compound ointment through [facility] for this.  -Follow-up if symptoms are not improving in the next few months or sooner if the toenail causes any issues.  -Patient encouraged to call the office with any questions, concerns, change  in symptoms.   Celesta Gentile, DPM

## 2016-05-05 ENCOUNTER — Ambulatory Visit: Payer: BLUE CROSS/BLUE SHIELD | Admitting: Podiatry

## 2016-05-13 ENCOUNTER — Telehealth: Payer: Self-pay | Admitting: *Deleted

## 2016-05-13 MED ORDER — NONFORMULARY OR COMPOUNDED ITEM: 2 refills | Status: AC

## 2016-05-13 NOTE — Telephone Encounter (Signed)
Dr. Wagoner ordered [facility] Onychomycosis Nail Lacquer. Orders faxed to [facility]. 

## 2016-12-06 DIAGNOSIS — R197 Diarrhea, unspecified: Secondary | ICD-10-CM

## 2016-12-06 HISTORY — DX: Diarrhea, unspecified: R19.7

## 2017-03-06 ENCOUNTER — Encounter (HOSPITAL_BASED_OUTPATIENT_CLINIC_OR_DEPARTMENT_OTHER): Payer: Self-pay | Admitting: Emergency Medicine

## 2017-03-06 ENCOUNTER — Emergency Department (HOSPITAL_BASED_OUTPATIENT_CLINIC_OR_DEPARTMENT_OTHER)
Admission: EM | Admit: 2017-03-06 | Discharge: 2017-03-06 | Disposition: A | Discharge status: Home / Self Care | Payer: BLUE CROSS/BLUE SHIELD | Attending: Emergency Medicine | Admitting: Emergency Medicine

## 2017-03-06 ENCOUNTER — Other Ambulatory Visit: Payer: Self-pay

## 2017-03-06 DIAGNOSIS — R109 Unspecified abdominal pain: Secondary | ICD-10-CM | POA: Diagnosis not present

## 2017-03-06 DIAGNOSIS — Z79899 Other long term (current) drug therapy: Secondary | ICD-10-CM | POA: Insufficient documentation

## 2017-03-06 DIAGNOSIS — E039 Hypothyroidism, unspecified: Secondary | ICD-10-CM | POA: Insufficient documentation

## 2017-03-06 DIAGNOSIS — R51 Headache: Secondary | ICD-10-CM | POA: Insufficient documentation

## 2017-03-06 DIAGNOSIS — R112 Nausea with vomiting, unspecified: Secondary | ICD-10-CM | POA: Diagnosis not present

## 2017-03-06 DIAGNOSIS — R0602 Shortness of breath: Secondary | ICD-10-CM | POA: Insufficient documentation

## 2017-03-06 DIAGNOSIS — R519 Headache, unspecified: Secondary | ICD-10-CM

## 2017-03-06 DIAGNOSIS — R197 Diarrhea, unspecified: Secondary | ICD-10-CM | POA: Diagnosis not present

## 2017-03-06 DIAGNOSIS — H53149 Visual discomfort, unspecified: Secondary | ICD-10-CM | POA: Insufficient documentation

## 2017-03-06 LAB — CBC WITH DIFFERENTIAL/PLATELET
Basophils Absolute: 0 10*3/uL (ref 0.0–0.1)
Basophils Relative: 1 %
Eosinophils Absolute: 0.1 10*3/uL (ref 0.0–0.7)
Eosinophils Relative: 2 %
HCT: 36.3 % (ref 36.0–46.0)
Hemoglobin: 12.2 g/dL (ref 12.0–15.0)
Lymphocytes Relative: 35 %
Lymphs Abs: 1.4 10*3/uL (ref 0.7–4.0)
MCH: 30.2 pg (ref 26.0–34.0)
MCHC: 33.6 g/dL (ref 30.0–36.0)
MCV: 89.9 fL (ref 78.0–100.0)
Monocytes Absolute: 0.3 10*3/uL (ref 0.1–1.0)
Monocytes Relative: 8 %
Neutro Abs: 2.2 10*3/uL (ref 1.7–7.7)
Neutrophils Relative %: 54 %
Platelets: 232 10*3/uL (ref 150–400)
RBC: 4.04 MIL/uL (ref 3.87–5.11)
RDW: 16.3 % — ABNORMAL HIGH (ref 11.5–15.5)
WBC: 4.1 10*3/uL (ref 4.0–10.5)

## 2017-03-06 LAB — COMPREHENSIVE METABOLIC PANEL
ALT: 23 U/L (ref 14–54)
AST: 40 U/L (ref 15–41)
Albumin: 4.2 g/dL (ref 3.5–5.0)
Alkaline Phosphatase: 92 U/L (ref 38–126)
Anion gap: 10 (ref 5–15)
BUN: 10 mg/dL (ref 6–20)
CO2: 28 mmol/L (ref 22–32)
Calcium: 9 mg/dL (ref 8.9–10.3)
Chloride: 104 mmol/L (ref 101–111)
Creatinine, Ser: 0.92 mg/dL (ref 0.44–1.00)
GFR calc Af Amer: >60 mL/min (ref >60)
GFR calc non Af Amer: >60 mL/min (ref >60)
Glucose, Bld: 108 mg/dL — ABNORMAL HIGH (ref 65–99)
Potassium: 3 mmol/L — ABNORMAL LOW (ref 3.5–5.1)
Sodium: 142 mmol/L (ref 135–145)
Total Bilirubin: 0.7 mg/dL (ref 0.3–1.2)
Total Protein: 7.7 g/dL (ref 6.5–8.1)

## 2017-03-06 LAB — LIPASE, BLOOD: Lipase: 20 U/L (ref 11–51)

## 2017-03-06 MED ORDER — SODIUM CHLORIDE 0.9 % IV BOLUS (SEPSIS)
1000.0000 mL | Freq: Once | INTRAVENOUS | Status: AC
  Administered 2017-03-06: 1000 mL via INTRAVENOUS

## 2017-03-06 MED ORDER — POTASSIUM CHLORIDE ER 10 MEQ PO TBCR: 10.0000 meq | EXTENDED_RELEASE_TABLET | Freq: Every day | ORAL | 0 refills | Status: AC

## 2017-03-06 MED ORDER — POTASSIUM CHLORIDE CRYS ER 20 MEQ PO TBCR
60.0000 meq | EXTENDED_RELEASE_TABLET | Freq: Once | ORAL | Status: DC
Start: 2017-03-06 — End: 2017-03-06

## 2017-03-06 MED ORDER — KETOROLAC TROMETHAMINE 30 MG/ML IJ SOLN
30.0000 mg | Freq: Once | INTRAMUSCULAR | Status: AC
  Administered 2017-03-06: 30 mg via INTRAVENOUS
  Filled 2017-03-06: qty 1

## 2017-03-06 MED ORDER — METOCLOPRAMIDE HCL 5 MG/ML IJ SOLN
10.0000 mg | Freq: Once | INTRAMUSCULAR | Status: AC
  Administered 2017-03-06: 10 mg via INTRAVENOUS
  Filled 2017-03-06: qty 2

## 2017-03-06 MED ORDER — METOCLOPRAMIDE HCL 10 MG PO TABS: 10.0000 mg | ORAL_TABLET | Freq: Four times a day (QID) | ORAL | 0 refills | Status: AC

## 2017-03-06 NOTE — ED Triage Notes (Signed)
Pt c/o HA, vomiting and chills x 2 days

## 2017-03-06 NOTE — ED Notes (Signed)
Pt. Has been up and down to rest room.  Pt. Feeling better per pt.  Pt. Has son at bedside.  No distress noted.  Pt. Reports no nausea at this time.

## 2017-03-06 NOTE — Discharge Instructions (Signed)
Medications: Reglan, potassium  Treatment: Take Reglan every 6 hours as needed for nausea or vomiting.  Take potassium once daily as prescribed for the next week.  Drink plenty of fluids.  Avoid greasy, fatty, spicy foods.  Stick with bland foods such as bananas, rice, applesauce, toast, for the next few days.  Follow-up: Please follow-up with your doctor on Monday for recheck.  Please also have your blood pressure rechecked.  Please return to the emergency department if you develop any new or worsening symptoms.

## 2017-03-06 NOTE — ED Provider Notes (Signed)
[facility] EMERGENC[facility] DEPART[facility] Provider Note   CSN: [id number] Arrival date & time: 03/06/17  1242     History   Chief Complaint Chief Complaint  Patient presents with  . Headache  . Emesis    HPI Lisa Hughes is a 52 y.o. female with distant history of migraine headaches, gastric band revision surgery in December 2018 who presents with a 2-day history of headache, nausea, and vomiting.  Patient reports she feels dehydrated.  Patient reports she has not had a headache in 2 or 3 years.  She reports she used to be on prophylactic medication, but is not anymore.  She reports very seldom getting nausea or vomiting in the past for her headaches.  Patient reports she is also had some chills.  She denies cough or nasal congestion.  Patient reports she has had persistent abdominal pain since her surgery in December 2018, and this is unchanged.  She reports some associated shortness of breath due to her pain.  She has had some photophobia, but no other visual disturbance.  She denies any numbness or tingling.  She denies any chest pain or urinary symptoms.  She took something similar to Tylenol at home around 6 AM this morning without relief.  HPI  Past Medical History:  Diagnosis Date  . GERD (gastroesophageal reflux disease)   . Sinusitis   . Thyroid disease     There are no active problems to display for this patient.   Past Surgical History:  Procedure Laterality Date  . ABDOMINAL HYSTERECTOMY    . CHOLECYSTECTOMY    . LAPAROSCOPIC GASTRIC BAND REMOVAL WITH LAPAROSCOPIC GASTRIC SLEEVE RESECTION     Gastric sleeve revision  . LAPAROSCOPIC GASTRIC SLEEVE RESECTION    . THYROIDECTOMY      OB History    No data available       Home Medications    Prior to Admission medications   Medication Sig Start Date End Date Taking? Authorizing Provider  Levothyroxine Sodium (SYNTHROID PO) Take by mouth.    [provider]  metoCLOPramide (REGLAN) 10 MG tablet Take  1 tablet (10 mg total) by mouth every 6 (six) hours. 03/06/17   Frederica Kuster, PA-C  NONFORMULARY OR COMPOUNDED ITEM [facility]:  Onychomycosis Nail lacquer - Fluconazole 2%, Terbinafine 1%, DMSO apply to affected area daily. 05/13/16   Trula Slade, DPM  potassium chloride (K-DUR) 10 MEQ tablet Take 1 tablet (10 mEq total) by mouth daily. 03/06/17   Frederica Kuster, PA-C    Family History History reviewed. No pertinent family history.  Social History Social History   Tobacco Use  . Smoking status: Never Smoker  . Smokeless tobacco: Never Used  Substance Use Topics  . Alcohol use: No  . Drug use: No     Allergies   Vicodin [hydrocodone-acetaminophen]   Review of Systems Review of Systems  Constitutional: Positive for chills. Negative for fever.  HENT: Negative for congestion, facial swelling and sore throat.   Eyes: Positive for visual disturbance.  Respiratory: Negative for cough and shortness of breath.   Cardiovascular: Negative for chest pain.  Gastrointestinal: Positive for abdominal pain (persistent since surgery in Dec '18), diarrhea (persistent since surgery in Dec '18), nausea and vomiting.  Genitourinary: Negative for dysuria.  Musculoskeletal: Negative for back pain.  Skin: Negative for rash and wound.  Neurological: Positive for headaches. Negative for weakness and numbness.  Psychiatric/Behavioral: The patient is not nervous/anxious.      Physical Exam Updated  Vital Signs BP (!) 159/88 (BP Location: Right Arm)   Pulse 60   Temp 98.7 F (37.1 C) (Oral)   Resp 18   Ht 5\' 2"  (1.575 m)   Wt 85.7 kg (189 lb)   LMP 08/24/2013   SpO2 100%   BMI 34.57 kg/m   Physical Exam  Constitutional: She appears well-developed and well-nourished. No distress.  HENT:  Head: Normocephalic and atraumatic.  Mouth/Throat: Oropharynx is clear and moist. No oropharyngeal exudate.  Eyes: Conjunctivae and EOM are normal. Pupils are equal, round, and reactive to  light. Right eye exhibits no discharge. Left eye exhibits no discharge. No scleral icterus.  Neck: Normal range of motion. Neck supple. No neck rigidity. No thyromegaly present.  Cardiovascular: Normal rate, regular rhythm, normal heart sounds and intact distal pulses. Exam reveals no gallop and no friction rub.  No murmur heard. Pulmonary/Chest: Effort normal and breath sounds normal. No stridor. No respiratory distress. She has no wheezes. She has no rales.  Abdominal: Soft. Bowel sounds are normal. She exhibits no distension. There is no tenderness. There is no rebound and no guarding.  Musculoskeletal: She exhibits no edema.  Lymphadenopathy:    She has no cervical adenopathy.  Neurological: She is alert. Coordination normal. GCS eye subscore is 4. GCS verbal subscore is 5. GCS motor subscore is 6.  CN 3-12 intact; normal sensation throughout; 5/5 strength in all 4 extremities; equal bilateral grip strength; no ataxia on finger to nose  Skin: Skin is warm and dry. No rash noted. She is not diaphoretic. No pallor.  Psychiatric: She has a normal mood and affect.  Nursing note and vitals reviewed.    ED Treatments / Results  Labs (all labs ordered are listed, but only abnormal results are displayed) Labs Reviewed  COMPREHENSIVE METABOLIC PANEL - Abnormal; Notable for the following components:      Result Value   Potassium 3.0 (*)    Glucose, Bld 108 (*)    All other components within normal limits  CBC WITH DIFFERENTIAL/PLATELET - Abnormal; Notable for the following components:   RDW 16.3 (*)    All other components within normal limits  LIPASE, BLOOD    EKG  EKG Interpretation None       Radiology No results found.  Procedures Procedures (including critical care time)  Medications Ordered in ED Medications  sodium chloride 0.9 % bolus 1,000 mL (0 mLs Intravenous Stopped 03/06/17 1704)  ketorolac (TORADOL) 30 MG/ML injection 30 mg (30 mg Intravenous Given 03/06/17 1546)    metoCLOPramide (REGLAN) injection 10 mg (10 mg Intravenous Given 03/06/17 1543)     Initial Impression / Assessment and Plan / ED Course  I have reviewed the triage vital signs and the nursing notes.  Pertinent labs & imaging results that were available during my care of the patient were reviewed by me and considered in my medical decision making (see chart for details).     Patient with suspected viral syndrome versus migraine headache.  Patient does have hypokalemia, otherwise labs unremarkable.  Patient is feeling much better after migraine cocktail and fluids.  Normal neuro exam without focal deficits.  No focal abdominal tenderness on exam.  Will discharge home with Reglan and potassium daily for 1 week.  Patient is tolerating oral fluids prior to discharge.  Patient to follow-up with PCP for recheck in 2-3 days.  Return precautions discussed.  Patient understands and agrees with plan.  Patient vitals stable throughout ED course and discharged in satisfactory  condition.  Final Clinical Impressions(s) / ED Diagnoses   Final diagnoses:  Bad headache  Non-intractable vomiting with nausea, unspecified vomiting type    ED Discharge Orders        Ordered    metoCLOPramide (REGLAN) 10 MG tablet  Every 6 hours     03/06/17 1737    potassium chloride (K-DUR) 10 MEQ tablet  Daily     03/06/17 1737       Frederica Kuster, PA-C 03/07/17 0050    Quintella Reichert, MD 03/07/17 1555

## 2017-03-06 NOTE — ED Notes (Signed)
Pt upset about not going straight back to a room, states she has been in a lot of pain with a headache for many days. Apologized to pt and explained we will have her a room as soon as we can.

## 2017-06-23 IMAGING — US US ABDOMEN LIMITED
1 series · 14 of 25 positions shown · non-contrast
Comparison: None.

CLINICAL DATA: Elevated LFTs

EXAM:
US ABDOMEN LIMITED - RIGHT UPPER QUADRANT

[Series 1: us abdomen limited · 0.19mm/px · 14 of 51 slices shown]
[im 1/51]
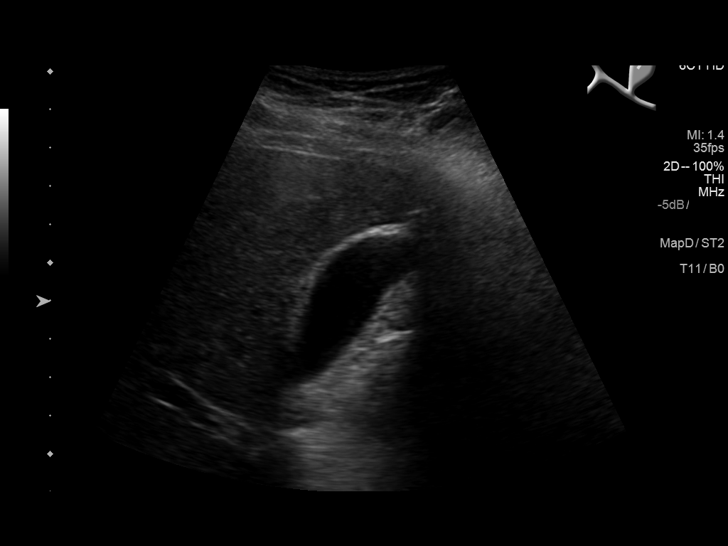
[im 5/51]
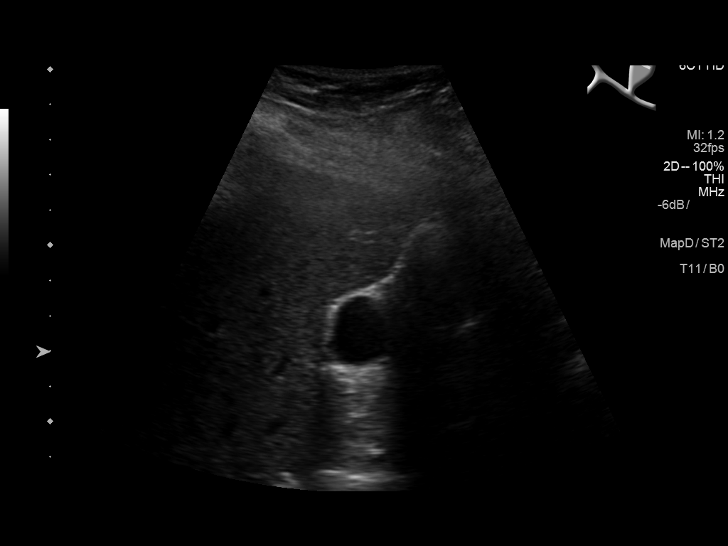
[im 9/51]
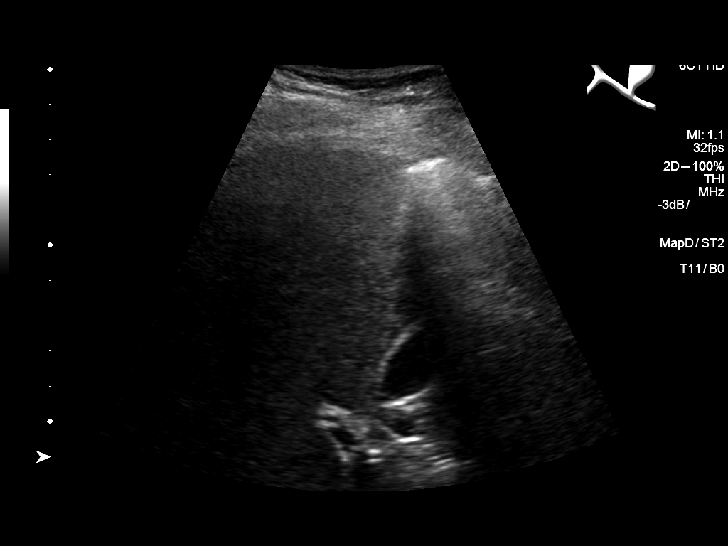
[im 13/51]
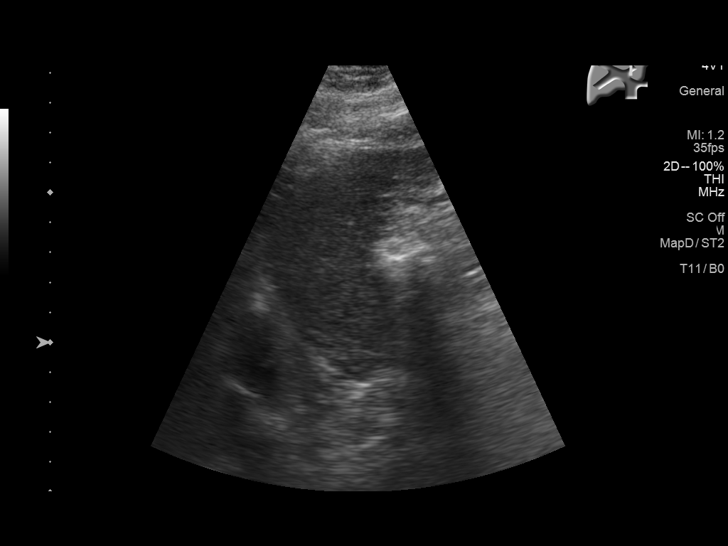
[im 17/51]
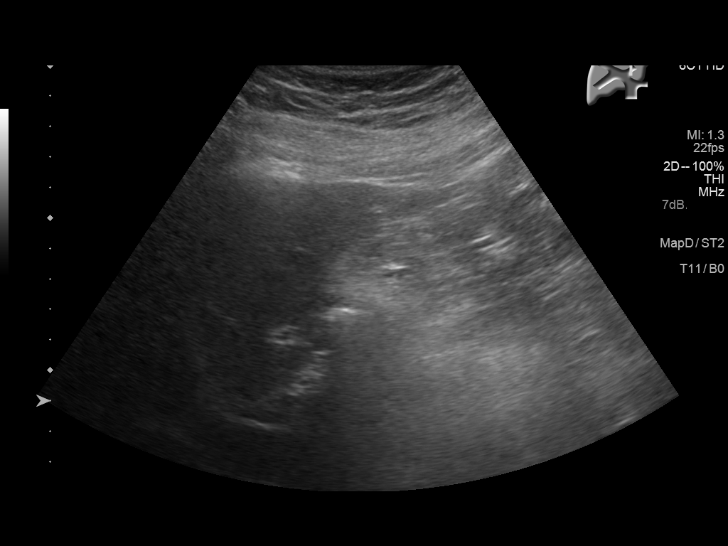
[im 19/51]
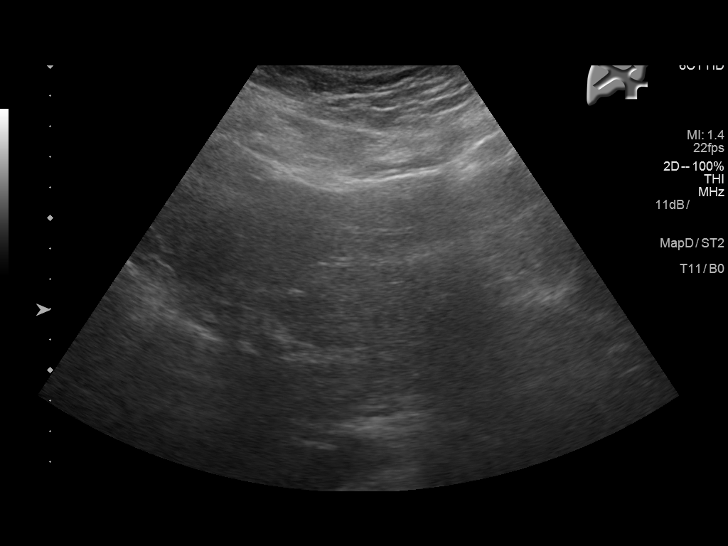
[im 23/51]
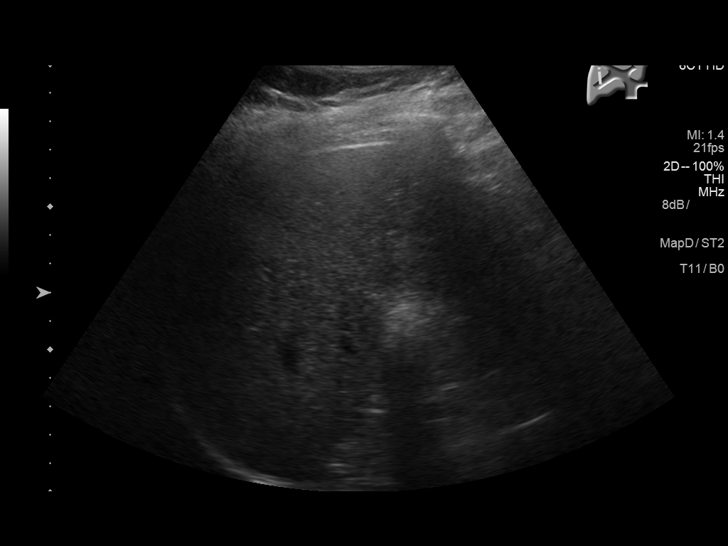
[im 28/51]
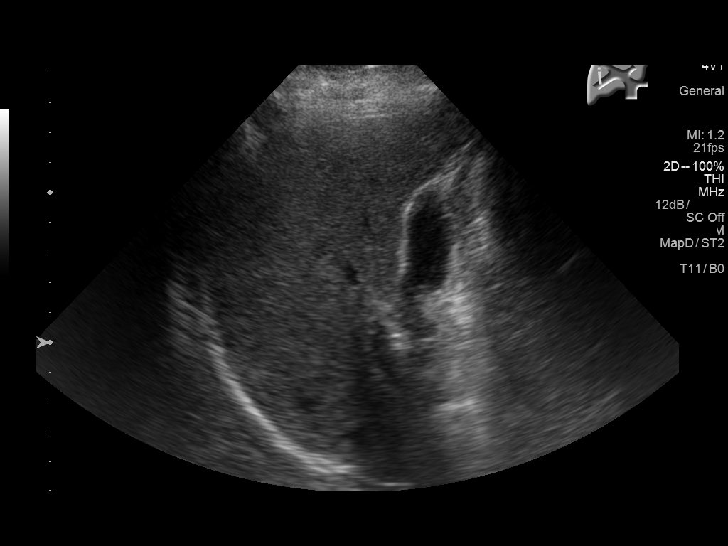
[im 32/51]
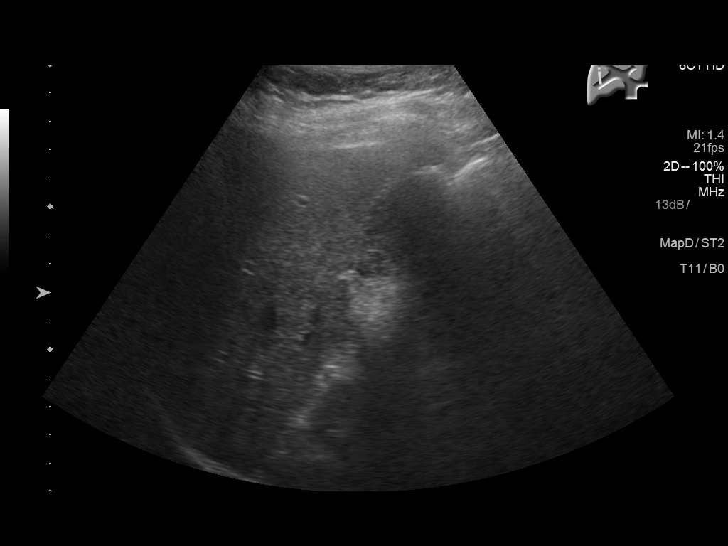
[im 34/51]
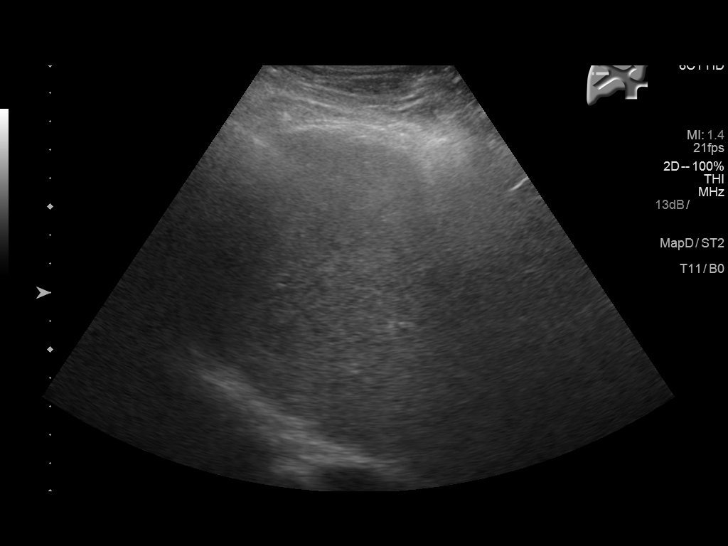
[im 38/51]
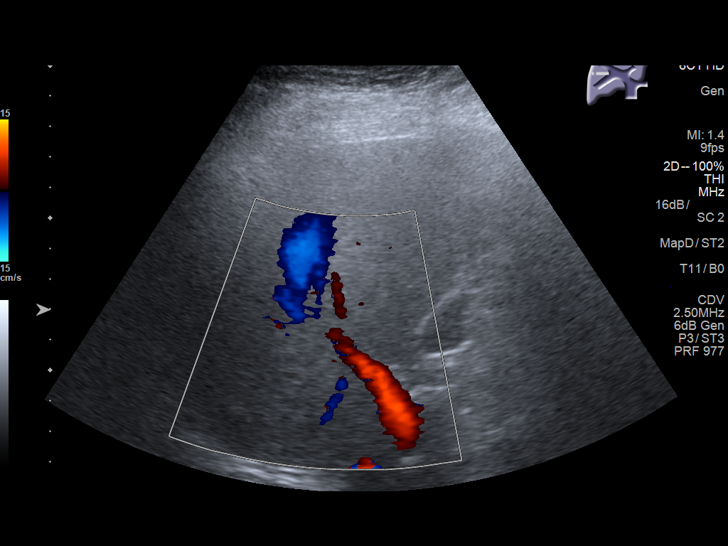
[im 42/51]
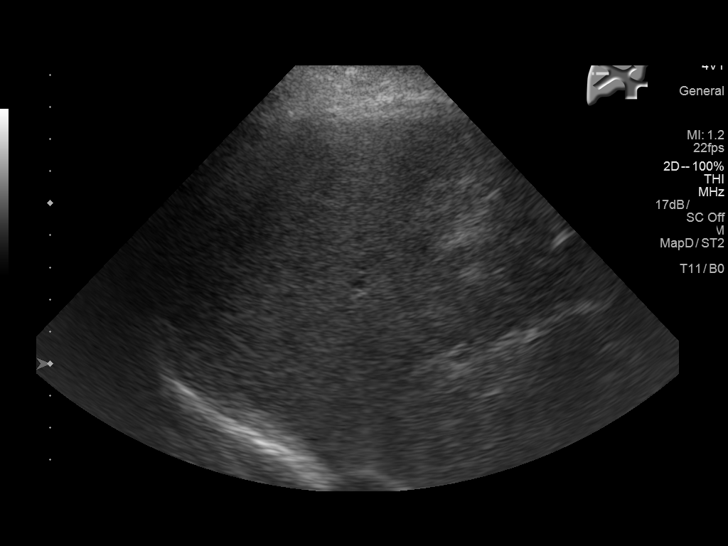
[im 46/51]
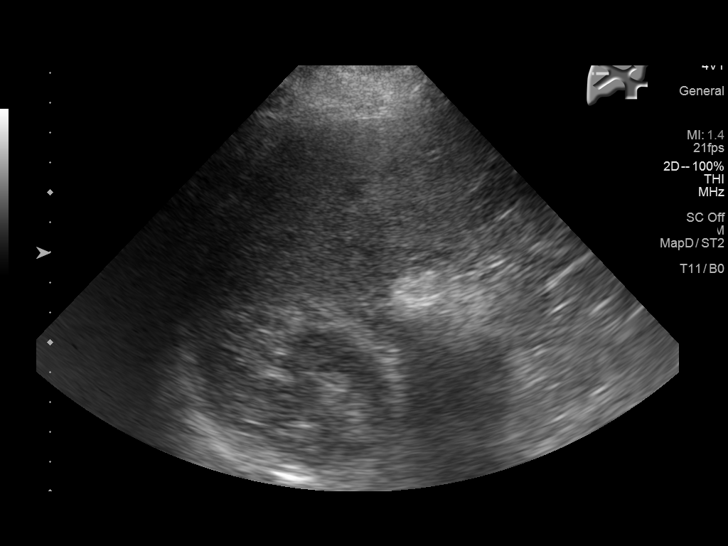
[im 51/51]
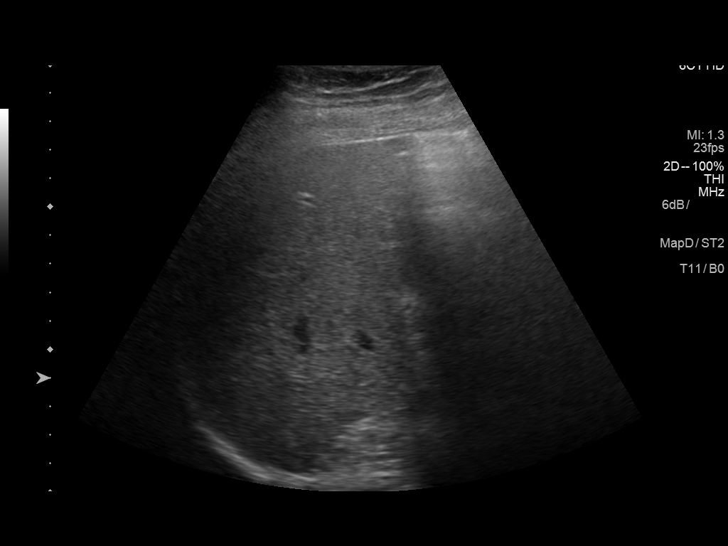

[14 of 25 positions shown; findings below may reference images not displayed]

FINDINGS: Gallbladder:

No gallstones or wall thickening visualized. No sonographic Murphy
sign noted by sonographer.

Common bile duct:

Diameter: Normal caliber, 2 mm

Liver:

Increased echotexture compatible with fatty infiltration. No focal
abnormality or biliary ductal dilatation.
IMPRESSION: Mild fatty infiltration of the liver.  No acute findings.

## 2017-07-23 ENCOUNTER — Emergency Department
Admission: EM | Admit: 2017-07-23 | Discharge: 2017-07-23 | Disposition: A | Discharge status: Home / Self Care | Payer: Medicaid Other | Attending: Emergency Medicine | Admitting: Emergency Medicine

## 2017-07-23 ENCOUNTER — Emergency Department: Payer: Medicaid Other

## 2017-07-23 DIAGNOSIS — R030 Elevated blood-pressure reading, without diagnosis of hypertension: Secondary | ICD-10-CM | POA: Insufficient documentation

## 2017-07-23 DIAGNOSIS — E079 Disorder of thyroid, unspecified: Secondary | ICD-10-CM | POA: Insufficient documentation

## 2017-07-23 DIAGNOSIS — J029 Acute pharyngitis, unspecified: Secondary | ICD-10-CM | POA: Insufficient documentation

## 2017-07-23 DIAGNOSIS — Z79899 Other long term (current) drug therapy: Secondary | ICD-10-CM | POA: Insufficient documentation

## 2017-07-23 DIAGNOSIS — R0789 Other chest pain: Secondary | ICD-10-CM | POA: Insufficient documentation

## 2017-07-23 DIAGNOSIS — J069 Acute upper respiratory infection, unspecified: Secondary | ICD-10-CM

## 2017-07-23 LAB — CBC AND DIFFERENTIAL
Absolute NRBC: 0 10*3/uL (ref 0.00–0.00)
Basophils Absolute Automated: 0.03 10*3/uL (ref 0.00–0.08)
Basophils Automated: 0.6 %
Eosinophils Absolute Automated: 0.1 10*3/uL (ref 0.00–0.44)
Eosinophils Automated: 2 %
Hematocrit: 34.8 % (ref 34.7–43.7)
Hgb: 11.4 g/dL (ref 11.4–14.8)
Immature Granulocytes Absolute: 0.01 10*3/uL (ref 0.00–0.07)
Immature Granulocytes: 0.2 %
Lymphocytes Absolute Automated: 1.55 10*3/uL (ref 0.42–3.22)
Lymphocytes Automated: 30.9 %
MCH: 31 pg (ref 25.1–33.5)
MCHC: 32.8 g/dL (ref 31.5–35.8)
MCV: 94.6 fL (ref 78.0–96.0)
MPV: 11.2 fL (ref 8.9–12.5)
Monocytes Absolute Automated: 0.38 10*3/uL (ref 0.21–0.85)
Monocytes: 7.6 %
Neutrophils Absolute: 2.94 10*3/uL (ref 1.10–6.33)
Neutrophils: 58.7 %
Nucleated RBC: 0 /100 WBC (ref 0.0–0.0)
Platelets: 226 10*3/uL (ref 142–346)
RBC: 3.68 10*6/uL — ABNORMAL LOW (ref 3.90–5.10)
RDW: 15 % (ref 11–15)
WBC: 5.01 10*3/uL (ref 3.10–9.50)

## 2017-07-23 LAB — HEPATIC FUNCTION PANEL
ALT: 34 U/L (ref 0–55)
AST (SGOT): 41 U/L — ABNORMAL HIGH (ref 5–34)
Albumin/Globulin Ratio: 1.4 (ref 0.9–2.2)
Albumin: 3.9 g/dL (ref 3.5–5.0)
Alkaline Phosphatase: 103 U/L (ref 37–106)
Bilirubin Direct: 0.8 mg/dL — ABNORMAL HIGH (ref 0.0–0.5)
Bilirubin Indirect: 0.3 mg/dL (ref 0.2–1.0)
Bilirubin, Total: 1.1 mg/dL (ref 0.2–1.2)
Globulin: 2.7 g/dL (ref 2.0–3.6)
Protein, Total: 6.6 g/dL (ref 6.0–8.3)

## 2017-07-23 LAB — BASIC METABOLIC PANEL
Anion Gap: 7 (ref 5.0–15.0)
BUN: 13 mg/dL (ref 7.0–19.0)
CO2: 26 mEq/L (ref 22–29)
Calcium: 9 mg/dL (ref 8.5–10.5)
Chloride: 111 mEq/L (ref 100–111)
Creatinine: 1.1 mg/dL — ABNORMAL HIGH (ref 0.6–1.0)
Glucose: 101 mg/dL — ABNORMAL HIGH (ref 70–100)
Potassium: 3.2 mEq/L — ABNORMAL LOW (ref 3.5–5.1)
Sodium: 144 mEq/L (ref 136–145)

## 2017-07-23 LAB — URINE HCG QUALITATIVE: Urine HCG Qualitative: NEGATIVE

## 2017-07-23 LAB — GROUP A STREP, RAPID ANTIGEN: Group A Strep, Rapid Antigen: NEGATIVE

## 2017-07-23 LAB — GFR: EGFR: >60

## 2017-07-23 LAB — TROPONIN I: Troponin I: <0.01 ng/mL (ref 0.00–0.09)

## 2017-07-23 MED ORDER — SODIUM CHLORIDE 0.9 % IV BOLUS
500.00 mL | Freq: Once | INTRAVENOUS | Status: AC
Start: 2017-07-23 — End: 2017-07-23
  Administered 2017-07-23: 11:00:00 500 mL via INTRAVENOUS

## 2017-07-23 MED ORDER — CETIRIZINE HCL 10 MG PO TABS
10.00 mg | ORAL_TABLET | Freq: Every day | ORAL | 0 refills | Status: DC
Start: 2017-07-23 — End: 2017-11-05

## 2017-07-23 MED ORDER — DEXAMETHASONE SODIUM PHOSPHATE 10 MG/ML IJ SOLN
INTRAMUSCULAR | Status: AC
Start: 2017-07-23 — End: 2017-07-23
  Administered 2017-07-23: 11:00:00 8 mg
  Filled 2017-07-23: qty 2

## 2017-07-23 MED ORDER — ALBUTEROL SULFATE (2.5 MG/3ML) 0.083% IN NEBU
2.50 mg | INHALATION_SOLUTION | Freq: Once | RESPIRATORY_TRACT | Status: AC
Start: 2017-07-23 — End: 2017-07-23
  Administered 2017-07-23: 11:00:00 2.5 mg via RESPIRATORY_TRACT
  Filled 2017-07-23: qty 3

## 2017-07-23 MED ORDER — POTASSIUM CHLORIDE CRYS ER 20 MEQ PO TBCR
20.00 meq | EXTENDED_RELEASE_TABLET | Freq: Once | ORAL | Status: AC
Start: 2017-07-23 — End: 2017-07-23
  Administered 2017-07-23: 12:00:00 20 meq via ORAL
  Filled 2017-07-23: qty 1

## 2017-07-23 MED ORDER — ASPIRIN 81 MG PO CHEW
324.00 mg | CHEWABLE_TABLET | Freq: Once | ORAL | Status: AC
Start: 2017-07-23 — End: 2017-07-23
  Administered 2017-07-23: 11:00:00 324 mg via ORAL
  Filled 2017-07-23: qty 4

## 2017-07-23 MED ORDER — ALBUTEROL SULFATE HFA 108 (90 BASE) MCG/ACT IN AERS
2.0000 | INHALATION_SPRAY | RESPIRATORY_TRACT | 0 refills | Status: DC | PRN
Start: 2017-07-23 — End: 2017-08-22

## 2017-07-23 MED ORDER — DEXAMETHASONE SODIUM PHOSPHATE 4 MG/ML IJ SOLN (WRAP)
8.00 mg | Freq: Once | INTRAMUSCULAR | Status: AC
Start: 2017-07-23 — End: 2017-07-23

## 2017-07-23 MED ORDER — ACETAMINOPHEN 500 MG PO TABS
1000.00 mg | ORAL_TABLET | Freq: Once | ORAL | Status: AC
Start: 2017-07-23 — End: 2017-07-23
  Administered 2017-07-23: 11:00:00 1000 mg via ORAL
  Filled 2017-07-23: qty 2

## 2017-07-23 MED ORDER — DEXAMETHASONE SODIUM PHOSPHATE 10 MG/ML IJ SOLN
INTRAMUSCULAR | Status: DC
Start: 2017-07-23 — End: 2017-07-23
  Filled 2017-07-23: qty 1

## 2017-07-23 NOTE — Discharge Instructions (Signed)
Chest Pain of Unclear Etiology     You have been seen for chest pain. The cause of your pain is not yet known.     Your doctor has learned about your medical history, examined you, and checked any tests that were done. Still, it is unclear why you are having pain. The doctor thinks there is only a very small chance that your pain is caused by a life-threatening condition. Later, your primary care doctor might do more tests or check you again.     Sometimes chest pain is caused by a dangerous condition, like a heart attack, aorta injury, blood clot in the lung, or collapsed lung. It is unlikely that your pain is caused by a life-threatening condition if: Your chest pain lasts only a few seconds at a time; you are not short of breath, nauseated (sick to your stomach), sweaty, or lightheaded; your pain gets worse when you twist or bend; your pain improves with exercise or hard work.     Chest pain is serious. It is VERY IMPORTANT that you follow up with your regular doctor and seek medical attention immediately here or at the nearest Emergency Department if your symptoms become worse or they change.     YOU SHOULD SEEK MEDICAL ATTENTION IMMEDIATELY, EITHER HERE OR AT THE NEAREST EMERGENCY DEPARTMENT, IF ANY OF THE FOLLOWING OCCURS:  · Your pain gets worse.  · Your pain makes you short of breath, nauseated, or sweaty.  · Your pain gets worse when you walk, go up stairs, or exert yourself.  · You feel weak, lightheaded, or faint.  · It hurts to breathe.  · Your leg swells.  · Your symptoms get worse or you have new symptoms or concerns.              Hypertension     You have been diagnosed with elevated blood pressure.     The medical term for high blood pressure is hypertension. Many people feel anxious or uncomfortable about being at the hospital. If you feel anxious today, this could make your blood pressure appear high, even if your blood pressure is usually normal. Check your blood pressure several more times when  you are not feeling stress. Keep a record of these readings and give this information to your regular doctor. He or she will decide whether you have hypertension that requires medical treatment.     If your blood pressure becomes extremely high all of a sudden, you will probably notice symptoms. In fact, very high blood pressure is a medical emergency. Most people with hypertension have blood pressure that is only a little too high. Mild high blood pressure does not cause specific symptoms. Instead, the effects of hypertension develop slowly over time. Untreated hypertension can affect the heart, brain, kidneys, eyes, and blood vessels. Unfortunately, by the time side-effects become noticeable, the body has already been damaged. This is why hypertension is called “the silent killer!”     It is important to follow up with your regular doctor. Check your blood pressure several times in the next 1 to 2 weeks and tell your doctor about the results. It may be helpful to keep a log or a journal where you can write down your blood pressures. Note the time of day and the activity you were doing when the reading was taken.     YOU SHOULD SEEK MEDICAL ATTENTION IMMEDIATELY, EITHER HERE OR AT THE NEAREST EMERGENCY DEPARTMENT, IF ANY OF THE FOLLOWING OCCURS:  · You have a headache.  · You have chest   chest pain.   You are short of breath or have trouble breathing.    You feel weak, especially on only one side of the body.   Your symptoms get worse or you have other concerns.               Pharyngitis    You have been diagnosed with pharyngitis.    Pharyngitis is an infection of the back of your throat. Most sore throats are caused by viruses and do not require antibiotics. Some sore throats are caused by bacteria. Antibiotics will help this type of sore throat. A test for Strep throat may be used to help in your diagnosis.    Symptoms of pharyngitis include fever (temperature higher than 100.65F / 38C), sore throat, and a  hoarse voice. If you have cold symptoms such as sneezing and coughing, runny nose, or congestion, your sore throat is more likely to be caused by a virus and not bacteria.    Whether your sore throat is caused by a virus or bacteria, you may need medication for pain and fever. You should also drink a lot of fluid. If your sore throat is caused by bacteria, you will also need antibiotics. If your sore throat is caused by a virus, you do not need antibiotics. Antibiotics will not kill the virus and they may cause side-effects, like diarrhea, abdominal cramps, or allergic reactions. Taking an antibiotic that you do not need may cause "resistance," meaning that antibiotic won't work in the future when you have a true bacterial infection.    YOU SHOULD SEEK MEDICAL ATTENTION IMMEDIATELY, EITHER HERE OR AT THE NEAREST EMERGENCY DEPARTMENT, IF ANY OF THE FOLLOWING OCCURS:   You have difficulty breathing.   Your voices changes or seems muffled.   You have trouble swallowing.   You have a fever (temperature higher than 100.65F / 38C) that won't go away.   You feel worse or do not improve after 2 to 3 days.               Musculoskeletal Chest Pain    You have been diagnosed with musculoskeletal chest pain.    Your pain is due to an injury or inflammation (swelling) of the muscles, ligaments, cartilage (soft bone), or bone in your chest. The pain is usually sharp and knife-like and becomes worse with twisting, bending, or moving. It commonly occurs in a small area, and can be irritated by pressing on it. There is usually no shortness of breath, lightheadedness, weakness, or sweaty feeling. Some children will have pain when taking a deep breath or when coughing. Exercise usually does not affect these symptoms.    Musculoskeletal chest pain is treated with anti-inflammatory medications like ibuprofen (Advil or Motrin) or naproxen (Aleve). Other pain medications are usually not needed. Depending on the reason for  your symptoms, either warm or cool compresses (damp washcloths laid on the skin) may be helpful.    Most musculoskeletal chest pain improves over several days.    YOU SHOULD SEEK MEDICAL ATTENTION IMMEDIATELY, EITHER HERE OR AT THE NEAREST EMERGENCY DEPARTMENT, IF ANY OF THE FOLLOWING OCCURS:   Your pain gets worse.   Your pain makes you feel short of breath, nauseated, or sweaty.   You notice that your pain gets worse as you walk, go up stairs, or exert yourself.   You have any weakness or lightheadedness with your pain.   Your pain makes breathing difficult.   You develop a swollen leg.  Your symptoms get worse or you have other concerns.     If you can't follow up with your doctor, or if at any time you feel you need to be rechecked or seen again, come back here or go to the nearest emergency department.               Upper Respiratory Infection    You have been diagnosed with a viral upper respiratory infection, often called a "URI."    The symptoms of a viral upper respiratory infection may include fever (temperature higher than 100.68F / 38C), runny nose, congestion and sinus fullness, facial pain, earache, sore throat, cough, and occasionally wheezing. The symptoms usually improve within 3 to 4 days. It might take up to 10 days before you feel completely better.    URI's are treated with fluids, rest, and medication for fever and pain. Sometimes decongestants, cough medications or-medications for wheezing can also help.    Antibiotics have NO effect whatsoever on viruses and ARE NOT needed for a URI. Taking antibiotics when they are not necessary can cause side effects (like diarrhea or allergic reactions). It can also cause resistance, meaning antibiotics won't work when you need them in the future.    YOU SHOULD SEEK MEDICAL ATTENTION IMMEDIATELY, EITHER HERE OR AT THE NEAREST EMERGENCY DEPARTMENT, IF ANY OF THE FOLLOWING OCCURS:   You have new or worse symptoms or concerns.   You are  short of breath.   You have a severe headache, stiff neck, confusion, or problems thinking.   You have nasal discharge, fever (temperature higher than 100.68F / 38C), or productive cough (one that brings up mucous from the lungs) lasting more that 10 days. Occasionally a viral cold may lead into a bacterial infection, such as a sinus infection, ear infection or pneumonia. Taking antibiotics during the cold will NOT prevent these infections, but if a secondary infection occurs, you might require additional treatment.    If you can't follow up with your doctor, or if at any time you feel you need to be rechecked or seen again, come back here or go to the nearest emergency department.

## 2017-07-23 NOTE — ED Triage Notes (Signed)
Patient c/o chest wall pain when coughing onset 2 days. Patient added sore throat when coughing.

## 2017-07-23 NOTE — ED Provider Notes (Signed)
EMERGENCY DEPARTMENT NOTE    Physician/Midlevel provider first contact with patient: 07/23/17 1014         HISTORY OF PRESENT ILLNESS   Historian:patient  Translator Used: No    Chief Complaint: Chest wall pain and Cough     Mechanism of Injury:       52 y.o. female presents to the emergency department with a chief complaint of sore throat and chest wall pain.  She reports that her sore throat has been worsening over the past week and her chest wall pain became worse yesterday.  Chest wall pain present with coughing or with certain movements.  She reports that her chest pain has been present since 0300 this morning when she woke up and has been constant since that time.  She denies any cardiac risk factors such as hypertension or hypercholesterolemia or diabetes or smoking or family history of significant cardiac disease.  She denies fever.  Sore throat is worse with swallowing.  She has some nasal congestion.  She also reports that she has a rash on her right forearm after being out in the sun for a short period of time yesterday.  She denies any wheezing but describes some of her chest pain as a tightness.  No nausea or vomiting.  No abdominal pain.  No other complaints at this time.    1. Location of symptoms: Throat, chest  2. Onset of symptoms: Chest for the past 2 days, throat for the past week  3. What was patient doing when symptoms started (Context): see above  4. Severity: Moderate  5. Timing: Chest pain is ongoing as of this morning throat pain ongoing  6. Activities that worsen symptoms: Throat pain is worse with swallowing, chest pain is worse with coughing or with sitting up or certain movements  7. Activities that improve symptoms: Nothing  8. Quality: Chest is described as soreness or tightness in throat is described as soreness  9. Radiation of symptoms: No  10. Associated signs and Symptoms: see above  11. Are symptoms worsening?  Yes  MEDICAL HISTORY     Past Medical History:  Past Medical  History:   Diagnosis Date   . Disorder of thyroid        Past Surgical History:  Past Surgical History:   Procedure Laterality Date   . 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 Marital status: Significant Other     Spouse name: N/A   . Number of children: N/A   . Years of education: N/A     Occupational History   . Not on file.     Social History Main Topics   . Smoking status: Never Smoker   . Smokeless tobacco: Never Used   . Alcohol use No   . Drug use: No   . Sexual activity: Not on file     Other Topics Concern   . Not on file     Social History Narrative   . No narrative on file       Family History:  History reviewed. No pertinent family history.    Outpatient Medication:  Discharge Medication List as of 07/23/2017 12:33 PM      CONTINUE these medications which have NOT CHANGED    Details   levothyroxine (SYNTHROID) 100 MCG tablet Take 100 mcg by mouth, Starting Thu 02/12/2017, Historical Med  REVIEW OF SYSTEMS   Review of Systems   Constitutional: Negative for fever.   HENT: Positive for congestion and sore throat. Negative for ear discharge, ear pain, nosebleeds and sinus pain.    Eyes: Negative for discharge and redness.   Respiratory: Positive for cough. Negative for sputum production and shortness of breath.    Cardiovascular: Positive for chest pain (chest wall tightness). Negative for leg swelling.   Gastrointestinal: Negative for abdominal pain, blood in stool, constipation, diarrhea and vomiting.   Genitourinary: Negative for dysuria and urgency.   Musculoskeletal: Negative for myalgias and neck pain.   Skin: Positive for rash (left forearm rash).   Neurological: Positive for headaches (slight headache now). Negative for dizziness, speech change, focal weakness, seizures and loss of consciousness.   Psychiatric/Behavioral: Negative for memory loss.   All other systems reviewed and are negative.              PHYSICAL EXAM     ED Triage Vitals [07/23/17 1021]   Enc Vitals Group      BP 159/83      Heart Rate (!) 58      Resp Rate 16      Temp 98.2 F (36.8 C)      Temp Source Oral      SpO2 99 %      Weight 76.7 kg      Height 1.575 m      Head Circumference       Peak Flow       Pain Score 6      Pain Loc       Pain Edu?       Excl. in GC?      Nursing note and vitals reviewed.  Constitutional:  Well developed, well nourished. alert and awake  Head:  Atraumatic. Normocephalic.    Eyes:  PERRL. EOMI. No scleral icterus  ENT:  Mucous membranes are moist and intact. Oropharynx has slight erythema but no exudate.  External ears normal. Patent airway.no sinus tenderness  Neck:  Supple. Full ROM.  No meningismus  Cardiovascular:  Regular rate. Regular rhythm. No murmurs, rubs, or gallops.  Pulmonary/Chest:  No evidence of respiratory distress. Clear to auscultation bilaterally.  No wheezing, rales or rhonchi.   Abdominal:  Soft and non-distended. No tenderness. No rebound, guarding, or rigidity.  Back:  Full ROM. Nontender. No Costovertebral Tenderness.  Extremities:  No edema. No cyanosis. Full range of motion in all extremities.  Skin:  Skin is warm and dry.  No diaphoresis. Slight raised erythematous rash on the left forearm, spares palms .   Neurological:  Alert, awake, and appropriate. Normal speech. Motor grossly normal. Cranial Nerves grossly intact by observation.   Psychiatric:  Good eye contact. Normal interaction, affect, and behavior.          MEDICAL DECISION MAKING     DISCUSSION    Patient presents for chest wall pain and cough and sore throat.  Constellation of symptoms seem to fit more with upper respiratory infection.  Patient has had constant chest pain all day today.  Heart score is low risk.  Only one set of troponins is necessary.  EKG shows no evidence of ST elevation MI.  Troponin negative.  Patient feeling much better on repeat examination.  She reports that the albuterol inhaler  significantly helped her.  She is feeling much improved.  Likely viral URI.  Strep test was negative.  Patient was given appropriate symptomatic treatment for  discharge.  Of note patient's blood pressure was elevated however she was asymptomatic and on the laboratory studies that we obtained there is no evidence of significant endorgan damage.  Patient does have slightly elevated creatinine at 1.1.  Patient had her gallbladder removed previously has a history of fatty liver which would explain the slightly elevated bilirubin and the elevated AST.  Potassium low likely secondary to patient's albuterol treatment.  She was given some potassium in the emergency department and advised to continue to eat and drink normally upon discharge.  For patient's elevated blood pressure she was advised to follow-up with her primary care physician as this does not seem like it is been an ongoing issue.  Patient and family members agree with disposition and plan.  Patient is stable for outpatient management of symptoms.  She was given strict return precautions and follow-up instructions.    Heart Score      Value   History  0   EKG  0   Risk Factors  0   Total (with age)  1   Onset of pain (time of START of last episode of chest pain)?  >6 hrs ago        Vital Signs: Reviewed the patient?s vital signs.   Nursing Notes: Reviewed and utilized available nursing notes.  Medical Records Reviewed: Reviewed available past medical records.  Counseling: The emergency provider has spoken with the patient and discussed today?s findings, in addition to providing specific details for the plan of care.  Questions are answered and there is agreement with the plan.        CARDIAC STUDIES    The following cardiac studies were independently interpreted by the Emergency Medicine Physician.  For full cardiac study results please see chart.    EKG Interpretation:  Signed and interpreted byED Physician   Time Interpreted: 1021   Comparison: no prior for  my comparison  Rate: 55  Rhythm: Sinus bradycardia  Axis: normal  Intervals: normal  Blocks: None  ST segments: Flat T wave in 1, aVL nonspecific ST/T wave changes without evidence of ST elevation MI  Interpretation: Flat T wave in 1, aVL left ventricular hypertrophy.  Nonspecific EKG with sinus bradycardia    EMERGENCY IMAGING STUDIES    The following imagine studies were independently interpreted by me (emergency physician):    Radiology:  Interpreted by me (ED Physician)  Study: Chest Xray   Results: No infiltrate. No pneumothorax. No hemothorax. No cardiomegaly. No CHF.  Impression: No acute intrathoracic abnormality.      RADIOLOGY IMAGING STUDIES      Chest 2 Views   Final Result        NO SIGNIFICANT ABNORMALITY.      Darnelle Maffucci, MD    07/23/2017 12:02 PM              PULSE OXIMETRY    Oxygen Saturation by Pulse Oximetry: 100%  Interventions: none  Interpretation:  normal    EMERGENCY DEPT. MEDICATIONS      ED Medication Orders     Start Ordered     Status Ordering Provider    07/23/17 1203 07/23/17 1202  potassium chloride (K-DUR,KLOR-CON) CR tablet 20 mEq  Once     Route: Oral  Ordered Dose: 20 mEq     Last MAR action:  Given Kelbie Moro M    07/23/17 1039 07/23/17 1039  dexamethasone (DECADRON) 10 mg/mL injection     Comments:  Created by cabinet override  Last MAR action:  Given     07/23/17 1037 07/23/17 1037       Comments:  Created by cabinet override    Discontinued     07/23/17 1035 07/23/17 1034  aspirin chewable tablet 324 mg  Once in ED     Route: Oral  Ordered Dose: 324 mg     Last MAR action:  Given Curtina Grills M    07/23/17 1035 07/23/17 1034  acetaminophen (TYLENOL) tablet 1,000 mg  Once     Route: Oral  Ordered Dose: 1,000 mg     Last MAR action:  Given Laakea Pereira M    07/23/17 1035 07/23/17 1034  sodium chloride 0.9 % bolus 500 mL  Once     Route: Intravenous  Ordered Dose: 500 mL     Last MAR action:  Stopped Ashley Bultema M    07/23/17 1035 07/23/17 1034  dexamethasone  (DECADRON) injection 8 mg  Once     Route: Oral  Ordered Dose: 8 mg     Last MAR action:  Override Pull Kaitlynd Phillips M    07/23/17 1035 07/23/17 1034  albuterol (PROVENTIL) (2.5 MG/3ML) 0.083% nebulizer solution 2.5 mg  RT - Once     Route: Nebulization  Ordered Dose: 2.5 mg     Last MAR action:  Given Lakendria Nicastro M          LABORATORY RESULTS    Ordered and independently interpreted AVAILABLE laboratory tests. Please see results section in chart for full details.  Results for orders placed or performed during the hospital encounter of 07/23/17   Rapid Strep   Result Value Ref Range    Group A Strep, Rapid Antigen Negative Negative   CBC with differential   Result Value Ref Range    WBC 5.01 3.10 - 9.50 x10 3/uL    Hgb 11.4 11.4 - 14.8 g/dL    Hematocrit 16.1 09.6 - 43.7 %    Platelets 226 142 - 346 x10 3/uL    RBC 3.68 (L) 3.90 - 5.10 x10 6/uL    MCV 94.6 78.0 - 96.0 fL    MCH 31.0 25.1 - 33.5 pg    MCHC 32.8 31.5 - 35.8 g/dL    RDW 15 11 - 15 %    MPV 11.2 8.9 - 12.5 fL    Neutrophils 58.7 None %    Lymphocytes Automated 30.9 None %    Monocytes 7.6 None %    Eosinophils Automated 2.0 None %    Basophils Automated 0.6 None %    Immature Granulocyte 0.2 None %    Nucleated RBC 0.0 0.0 - 0.0 /100 WBC    Neutrophils Absolute 2.94 1.10 - 6.33 x10 3/uL    Abs Lymph Automated 1.55 0.42 - 3.22 x10 3/uL    Abs Mono Automated 0.38 0.21 - 0.85 x10 3/uL    Abs Eos Automated 0.10 0.00 - 0.44 x10 3/uL    Absolute Baso Automated 0.03 0.00 - 0.08 x10 3/uL    Absolute Immature Granulocyte 0.01 0.00 - 0.07 x10 3/uL    Absolute NRBC 0.00 0.00 - 0.00 x10 3/uL   Basic Metabolic Panel   Result Value Ref Range    Glucose 101 (H) 70 - 100 mg/dL    BUN 04.5 7.0 - 40.9 mg/dL    Creatinine 1.1 (H) 0.6 - 1.0 mg/dL    Calcium 9.0 8.5 - 81.1 mg/dL    Sodium 914 782 - 956 mEq/L  Potassium 3.2 (L) 3.5 - 5.1 mEq/L    Chloride 111 100 - 111 mEq/L    CO2 26 22 - 29 mEq/L    Anion Gap 7.0 5.0 - 15.0   Hepatic function panel (LFT)   Result  Value Ref Range    Bilirubin, Total 1.1 0.2 - 1.2 mg/dL    Bilirubin, Direct 0.8 (H) 0.0 - 0.5 mg/dL    Bilirubin, Indirect 0.3 0.2 - 1.0 mg/dL    AST (SGOT) 41 (H) 5 - 34 U/L    ALT 34 0 - 55 U/L    Alkaline Phosphatase 103 37 - 106 U/L    Protein, Total 6.6 6.0 - 8.3 g/dL    Albumin 3.9 3.5 - 5.0 g/dL    Globulin 2.7 2.0 - 3.6 g/dL    Albumin/Globulin Ratio 1.4 0.9 - 2.2   Troponin I   Result Value Ref Range    Troponin I <0.01 0.00 - 0.09 ng/mL   Urine HCG Qual   Result Value Ref Range    Urine HCG Qualitative Negative Negative   GFR   Result Value Ref Range    EGFR >60.0        CRITICAL CARE/PROCEDURES    Procedures      DIAGNOSIS      Diagnosis:  Final diagnoses:   Elevated blood pressure reading   Viral upper respiratory tract infection   Pharyngitis, unspecified etiology   Chest wall pain       Disposition:  ED Disposition     ED Disposition Condition Date/Time Comment    Discharge  Thu Jul 23, 2017 12:33 PM [name] discharge to home/self care.    Condition at disposition: Stable          Prescriptions:  Discharge Medication List as of 07/23/2017 12:33 PM      START taking these medications    Details   albuterol (PROVENTIL HFA;VENTOLIN HFA) 108 (90 Base) MCG/ACT inhaler Inhale 2 puffs into the lungs every 4 (four) hours as needed for Wheezing or Shortness of Breath (coughing) Dispense with spacer, Starting Thu 07/23/2017, Until Sat 08/22/2017, Print      cetirizine (ZYRTEC) 10 MG tablet Take 1 tablet (10 mg total) by mouth daily, Starting Thu 07/23/2017, Print         CONTINUE these medications which have NOT CHANGED    Details   levothyroxine (SYNTHROID) 100 MCG tablet Take 100 mcg by mouth, Starting Thu 02/12/2017, Historical Med                  Arther Abbott, DO  07/24/17 [phone]

## 2017-07-26 LAB — ECG 12-LEAD
Atrial Rate: 55 {beats}/min
P Axis: 21 degrees
P-R Interval: 168 ms
Q-T Interval: 414 ms
QRS Duration: 84 ms
QTC Calculation (Bezet): 396 ms
R Axis: -17 degrees
T Axis: 45 degrees
Ventricular Rate: 55 {beats}/min

## 2017-08-14 ENCOUNTER — Other Ambulatory Visit: Payer: Self-pay

## 2017-08-14 ENCOUNTER — Emergency Department: Payer: Medicaid Other

## 2017-08-14 ENCOUNTER — Observation Stay
Admission: AD | Admit: 2017-08-14 | Discharge: 2017-08-16 | Disposition: A | Discharge status: Home / Self Care | Payer: Medicaid Other | Source: Other Acute Inpatient Hospital | Attending: Hospitalist | Admitting: Hospitalist

## 2017-08-14 ENCOUNTER — Emergency Department
Admission: EM | Admit: 2017-08-14 | Discharge: 2017-08-14 | Disposition: A | Discharge status: Other Acute Inpatient Hospital | Payer: Medicaid Other | Attending: Emergency Medicine | Admitting: Emergency Medicine

## 2017-08-14 DIAGNOSIS — N179 Acute kidney failure, unspecified: Secondary | ICD-10-CM | POA: Insufficient documentation

## 2017-08-14 DIAGNOSIS — R740 Nonspecific elevation of levels of transaminase and lactic acid dehydrogenase [LDH]: Secondary | ICD-10-CM | POA: Insufficient documentation

## 2017-08-14 DIAGNOSIS — K449 Diaphragmatic hernia without obstruction or gangrene: Secondary | ICD-10-CM | POA: Insufficient documentation

## 2017-08-14 DIAGNOSIS — R1900 Intra-abdominal and pelvic swelling, mass and lump, unspecified site: Secondary | ICD-10-CM | POA: Insufficient documentation

## 2017-08-14 DIAGNOSIS — E785 Hyperlipidemia, unspecified: Secondary | ICD-10-CM | POA: Insufficient documentation

## 2017-08-14 DIAGNOSIS — E079 Disorder of thyroid, unspecified: Secondary | ICD-10-CM | POA: Insufficient documentation

## 2017-08-14 DIAGNOSIS — E876 Hypokalemia: Secondary | ICD-10-CM | POA: Insufficient documentation

## 2017-08-14 DIAGNOSIS — R638 Other symptoms and signs concerning food and fluid intake: Secondary | ICD-10-CM

## 2017-08-14 DIAGNOSIS — Z8249 Family history of ischemic heart disease and other diseases of the circulatory system: Secondary | ICD-10-CM | POA: Insufficient documentation

## 2017-08-14 DIAGNOSIS — E7800 Pure hypercholesterolemia, unspecified: Secondary | ICD-10-CM | POA: Insufficient documentation

## 2017-08-14 DIAGNOSIS — Z9049 Acquired absence of other specified parts of digestive tract: Secondary | ICD-10-CM | POA: Insufficient documentation

## 2017-08-14 DIAGNOSIS — R109 Unspecified abdominal pain: Secondary | ICD-10-CM

## 2017-08-14 DIAGNOSIS — I6523 Occlusion and stenosis of bilateral carotid arteries: Secondary | ICD-10-CM | POA: Insufficient documentation

## 2017-08-14 DIAGNOSIS — I10 Essential (primary) hypertension: Secondary | ICD-10-CM | POA: Insufficient documentation

## 2017-08-14 DIAGNOSIS — E890 Postprocedural hypothyroidism: Secondary | ICD-10-CM | POA: Insufficient documentation

## 2017-08-14 DIAGNOSIS — Z8679 Personal history of other diseases of the circulatory system: Secondary | ICD-10-CM

## 2017-08-14 DIAGNOSIS — IMO0002 Reserved for concepts with insufficient information to code with codable children: Secondary | ICD-10-CM

## 2017-08-14 DIAGNOSIS — K760 Fatty (change of) liver, not elsewhere classified: Secondary | ICD-10-CM | POA: Insufficient documentation

## 2017-08-14 DIAGNOSIS — Z9884 Bariatric surgery status: Secondary | ICD-10-CM | POA: Insufficient documentation

## 2017-08-14 DIAGNOSIS — K529 Noninfective gastroenteritis and colitis, unspecified: Principal | ICD-10-CM | POA: Insufficient documentation

## 2017-08-14 DIAGNOSIS — R1905 Periumbilic swelling, mass or lump: Secondary | ICD-10-CM

## 2017-08-14 DIAGNOSIS — K219 Gastro-esophageal reflux disease without esophagitis: Secondary | ICD-10-CM | POA: Insufficient documentation

## 2017-08-14 DIAGNOSIS — D649 Anemia, unspecified: Secondary | ICD-10-CM | POA: Insufficient documentation

## 2017-08-14 DIAGNOSIS — G4733 Obstructive sleep apnea (adult) (pediatric): Secondary | ICD-10-CM | POA: Insufficient documentation

## 2017-08-14 DIAGNOSIS — Z79899 Other long term (current) drug therapy: Secondary | ICD-10-CM | POA: Insufficient documentation

## 2017-08-14 DIAGNOSIS — E559 Vitamin D deficiency, unspecified: Secondary | ICD-10-CM | POA: Insufficient documentation

## 2017-08-14 DIAGNOSIS — Z8379 Family history of other diseases of the digestive system: Secondary | ICD-10-CM | POA: Insufficient documentation

## 2017-08-14 DIAGNOSIS — G43909 Migraine, unspecified, not intractable, without status migrainosus: Secondary | ICD-10-CM

## 2017-08-14 DIAGNOSIS — Z90710 Acquired absence of both cervix and uterus: Secondary | ICD-10-CM | POA: Insufficient documentation

## 2017-08-14 HISTORY — DX: Gastro-esophageal reflux disease without esophagitis: K21.9

## 2017-08-14 HISTORY — DX: Vitamin D deficiency, unspecified: E55.9

## 2017-08-14 HISTORY — DX: Sleep apnea, unspecified: G47.30

## 2017-08-14 HISTORY — DX: Occlusion and stenosis of unspecified carotid artery: I65.29

## 2017-08-14 LAB — CBC AND DIFFERENTIAL
Absolute NRBC: 0 10*3/uL (ref 0.00–0.00)
Basophils Absolute Automated: 0.03 10*3/uL (ref 0.00–0.08)
Basophils Automated: 0.8 %
Eosinophils Absolute Automated: 0.16 10*3/uL (ref 0.00–0.44)
Eosinophils Automated: 4.5 %
Hematocrit: 34.2 % — ABNORMAL LOW (ref 34.7–43.7)
Hgb: 11.1 g/dL — ABNORMAL LOW (ref 11.4–14.8)
Immature Granulocytes Absolute: 0 10*3/uL (ref 0.00–0.07)
Immature Granulocytes: 0 %
Lymphocytes Absolute Automated: 1.31 10*3/uL (ref 0.42–3.22)
Lymphocytes Automated: 36.8 %
MCH: 30.8 pg (ref 25.1–33.5)
MCHC: 32.5 g/dL (ref 31.5–35.8)
MCV: 95 fL (ref 78.0–96.0)
MPV: 11.3 fL (ref 8.9–12.5)
Monocytes Absolute Automated: 0.3 10*3/uL (ref 0.21–0.85)
Monocytes: 8.4 %
Neutrophils Absolute: 1.76 10*3/uL (ref 1.10–6.33)
Neutrophils: 49.5 %
Nucleated RBC: 0 /100 WBC (ref 0.0–0.0)
Platelets: 243 10*3/uL (ref 142–346)
RBC: 3.6 10*6/uL — ABNORMAL LOW (ref 3.90–5.10)
RDW: 15 % (ref 11–15)
WBC: 3.56 10*3/uL (ref 3.10–9.50)

## 2017-08-14 LAB — COMPREHENSIVE METABOLIC PANEL
ALT: 37 U/L (ref 0–55)
AST (SGOT): 47 U/L — ABNORMAL HIGH (ref 5–34)
Albumin/Globulin Ratio: 1.6 (ref 0.9–2.2)
Albumin: 3.6 g/dL (ref 3.5–5.0)
Alkaline Phosphatase: 102 U/L (ref 37–106)
Anion Gap: 8 (ref 5.0–15.0)
BUN: 11 mg/dL (ref 7.0–19.0)
Bilirubin, Total: 1 mg/dL (ref 0.2–1.2)
CO2: 27 mEq/L (ref 22–29)
Calcium: 8.4 mg/dL — ABNORMAL LOW (ref 8.5–10.5)
Chloride: 110 mEq/L (ref 100–111)
Creatinine: 1 mg/dL (ref 0.6–1.0)
Globulin: 2.2 g/dL (ref 2.0–3.6)
Glucose: 89 mg/dL (ref 70–100)
Potassium: 3.1 mEq/L — ABNORMAL LOW (ref 3.5–5.1)
Protein, Total: 5.8 g/dL — ABNORMAL LOW (ref 6.0–8.3)
Sodium: 145 mEq/L (ref 136–145)

## 2017-08-14 LAB — URINALYSIS
Bilirubin, UA: NEGATIVE
Blood, UA: NEGATIVE
Glucose, UA: NEGATIVE
Ketones UA: NEGATIVE
Leukocyte Esterase, UA: NEGATIVE
Nitrite, UA: NEGATIVE
Protein, UR: NEGATIVE
Specific Gravity UA: 1.005 (ref 1.001–1.035)
Urine pH: 5 (ref 5.0–8.0)
Urobilinogen, UA: <2 mg/dL

## 2017-08-14 LAB — URINE HCG QUALITATIVE: Urine HCG Qualitative: NEGATIVE

## 2017-08-14 LAB — PT AND APTT
PT INR: 1.1 (ref 0.9–1.1)
PT: 14.3 s (ref 12.6–15.0)
PTT: 27 s (ref 23–37)

## 2017-08-14 LAB — LIPASE: Lipase: <4 U/L — ABNORMAL LOW (ref 8–78)

## 2017-08-14 LAB — GFR: EGFR: >60

## 2017-08-14 MED ORDER — DIPHENHYDRAMINE HCL 50 MG/ML IJ SOLN
25.00 mg | Freq: Once | INTRAMUSCULAR | Status: DC
Start: 2017-08-14 — End: 2017-08-14
  Filled 2017-08-14: qty 1

## 2017-08-14 MED ORDER — ACETAMINOPHEN 325 MG PO TABS
650.00 mg | ORAL_TABLET | Freq: Four times a day (QID) | ORAL | Status: DC | PRN
Start: 2017-08-14 — End: 2017-08-16
  Administered 2017-08-14 – 2017-08-16 (×2): 650 mg via ORAL
  Filled 2017-08-14 (×2): qty 2

## 2017-08-14 MED ORDER — CIPROFLOXACIN IN D5W 400 MG/200ML IV SOLN
400.00 mg | Freq: Two times a day (BID) | INTRAVENOUS | Status: DC
Start: 2017-08-14 — End: 2017-08-16
  Administered 2017-08-15 (×3): 400 mg via INTRAVENOUS
  Filled 2017-08-14 (×4): qty 200

## 2017-08-14 MED ORDER — POTASSIUM CHLORIDE 10 MEQ/100ML IV SOLN (WRAP)
10.0000 meq | INTRAVENOUS | Status: AC
Start: 2017-08-14 — End: 2017-08-14
  Administered 2017-08-14 (×3): 10 meq via INTRAVENOUS
  Filled 2017-08-14 (×3): qty 100

## 2017-08-14 MED ORDER — SODIUM CHLORIDE 0.9 % IV BOLUS
1000.00 mL | Freq: Once | INTRAVENOUS | Status: AC
Start: 2017-08-14 — End: 2017-08-14
  Administered 2017-08-14: 15:00:00 1000 mL via INTRAVENOUS

## 2017-08-14 MED ORDER — DIPHENHYDRAMINE HCL 50 MG/ML IJ SOLN
25.00 mg | Freq: Once | INTRAMUSCULAR | Status: AC
Start: 2017-08-14 — End: 2017-08-14
  Administered 2017-08-14: 15:00:00 25 mg via INTRAVENOUS
  Filled 2017-08-14: qty 1

## 2017-08-14 MED ORDER — ENOXAPARIN SODIUM 40 MG/0.4ML SC SOLN
40.00 mg | Freq: Every day | SUBCUTANEOUS | Status: DC
Start: 2017-08-14 — End: 2017-08-16
  Filled 2017-08-14 (×3): qty 0.4

## 2017-08-14 MED ORDER — FAMOTIDINE 10 MG/ML IV SOLN (WRAP)
20.00 mg | Freq: Once | INTRAVENOUS | Status: AC
Start: 2017-08-14 — End: 2017-08-14
  Administered 2017-08-14: 09:00:00 20 mg via INTRAVENOUS
  Filled 2017-08-14: qty 2

## 2017-08-14 MED ORDER — MORPHINE SULFATE 10 MG/ML IJ/IV SOLN (WRAP)
8.0000 mg | Freq: Once | Status: AC
Start: 2017-08-14 — End: 2017-08-14
  Administered 2017-08-14: 8 mg via INTRAVENOUS
  Filled 2017-08-14: qty 1

## 2017-08-14 MED ORDER — IOHEXOL 350 MG/ML IV SOLN
100.00 mL | Freq: Once | INTRAVENOUS | Status: AC | PRN
Start: 2017-08-14 — End: 2017-08-14
  Administered 2017-08-14: 11:00:00 100 mL via INTRAVENOUS

## 2017-08-14 MED ORDER — MORPHINE SULFATE 4 MG/ML IJ/IV SOLN (WRAP)
4.0000 mg | Freq: Once | Status: AC
Start: 2017-08-14 — End: 2017-08-14
  Administered 2017-08-14: 09:00:00 4 mg via INTRAVENOUS
  Filled 2017-08-14: qty 1

## 2017-08-14 MED ORDER — ONDANSETRON HCL 4 MG/2ML IJ SOLN
4.00 mg | Freq: Three times a day (TID) | INTRAMUSCULAR | Status: DC | PRN
Start: 2017-08-14 — End: 2017-08-16
  Administered 2017-08-14 – 2017-08-15 (×2): 4 mg via INTRAVENOUS
  Filled 2017-08-14 (×2): qty 2

## 2017-08-14 MED ORDER — SODIUM CHLORIDE 0.9 % IV SOLN
INTRAVENOUS | Status: DC
Start: 2017-08-14 — End: 2017-08-16

## 2017-08-14 MED ORDER — HYDRALAZINE HCL 20 MG/ML IJ SOLN
10.00 mg | Freq: Four times a day (QID) | INTRAMUSCULAR | Status: DC | PRN
Start: 2017-08-14 — End: 2017-08-16
  Administered 2017-08-14 – 2017-08-16 (×2): 10 mg via INTRAVENOUS
  Filled 2017-08-14 (×2): qty 1

## 2017-08-14 MED ORDER — METOCLOPRAMIDE HCL 5 MG/ML IJ SOLN
5.00 mg | Freq: Once | INTRAMUSCULAR | Status: AC
Start: 2017-08-14 — End: 2017-08-14
  Administered 2017-08-14: 15:00:00 5 mg via INTRAVENOUS
  Filled 2017-08-14: qty 2

## 2017-08-14 MED ORDER — FAMOTIDINE 10 MG/ML IV SOLN (WRAP)
20.00 mg | Freq: Two times a day (BID) | INTRAVENOUS | Status: DC
Start: 2017-08-14 — End: 2017-08-16
  Administered 2017-08-14: 21:00:00 20 mg via INTRAVENOUS
  Filled 2017-08-14: qty 2

## 2017-08-14 MED ORDER — NALOXONE HCL 0.4 MG/ML IJ SOLN (WRAP)
0.20 mg | INTRAMUSCULAR | Status: DC | PRN
Start: 2017-08-14 — End: 2017-08-16

## 2017-08-14 MED ORDER — ONDANSETRON HCL 4 MG/2ML IJ SOLN
4.00 mg | Freq: Once | INTRAMUSCULAR | Status: DC
Start: 2017-08-14 — End: 2017-08-14

## 2017-08-14 MED ORDER — FAMOTIDINE 20 MG PO TABS
20.00 mg | ORAL_TABLET | Freq: Two times a day (BID) | ORAL | Status: DC
Start: 2017-08-14 — End: 2017-08-16
  Administered 2017-08-15 – 2017-08-16 (×3): 20 mg via ORAL
  Filled 2017-08-14 (×3): qty 1

## 2017-08-14 MED ORDER — CIPROFLOXACIN IN D5W 400 MG/200ML IV SOLN
400.00 mg | Freq: Once | INTRAVENOUS | Status: DC
Start: 2017-08-14 — End: 2017-08-14
  Administered 2017-08-14: 17:00:00 400 mg via INTRAVENOUS
  Filled 2017-08-14: qty 200

## 2017-08-14 MED ORDER — ONDANSETRON HCL 4 MG/2ML IJ SOLN
4.00 mg | Freq: Once | INTRAMUSCULAR | Status: AC
Start: 2017-08-14 — End: 2017-08-14
  Administered 2017-08-14: 11:00:00 4 mg via INTRAVENOUS
  Filled 2017-08-14: qty 2

## 2017-08-14 MED ORDER — DIPHENHYDRAMINE HCL 50 MG/ML IJ SOLN
12.50 mg | Freq: Once | INTRAMUSCULAR | Status: AC
Start: 2017-08-14 — End: 2017-08-15
  Administered 2017-08-15: 01:00:00 12.5 mg via INTRAVENOUS
  Filled 2017-08-14: qty 1

## 2017-08-14 MED ORDER — MORPHINE SULFATE 2 MG/ML IJ/IV SOLN (WRAP)
1.0000 mg | Status: DC | PRN
Start: 2017-08-14 — End: 2017-08-16
  Administered 2017-08-14 – 2017-08-15 (×3): 1 mg via INTRAVENOUS
  Filled 2017-08-14 (×4): qty 1

## 2017-08-14 MED ORDER — MORPHINE SULFATE 4 MG/ML IJ/IV SOLN (WRAP)
4.0000 mg | Freq: Once | Status: DC
Start: 2017-08-14 — End: 2017-08-14

## 2017-08-14 MED ORDER — METRONIDAZOLE IN NACL 500 MG/100 ML IV SOLN
500.00 mg | Freq: Three times a day (TID) | INTRAVENOUS | Status: DC
Start: 2017-08-14 — End: 2017-08-16
  Administered 2017-08-14 – 2017-08-16 (×5): 500 mg via INTRAVENOUS
  Filled 2017-08-14 (×5): qty 100

## 2017-08-14 MED ORDER — ONDANSETRON HCL 4 MG/2ML IJ SOLN
4.00 mg | Freq: Once | INTRAMUSCULAR | Status: AC
Start: 2017-08-14 — End: 2017-08-14
  Administered 2017-08-14: 09:00:00 4 mg via INTRAVENOUS
  Filled 2017-08-14: qty 2

## 2017-08-14 MED ORDER — SODIUM CHLORIDE 0.9 % IV BOLUS
1000.00 mL | Freq: Once | INTRAVENOUS | Status: AC
Start: 2017-08-14 — End: 2017-08-14
  Administered 2017-08-14: 09:00:00 1000 mL via INTRAVENOUS

## 2017-08-14 MED ORDER — METRONIDAZOLE IN NACL 500 MG/100 ML IV SOLN
500.00 mg | Freq: Once | INTRAVENOUS | Status: DC
Start: 2017-08-14 — End: 2017-08-14

## 2017-08-14 MED ORDER — PROMETHAZINE HCL 25 MG/ML IJ SOLN
6.25 mg | Freq: Four times a day (QID) | INTRAMUSCULAR | Status: DC | PRN
Start: 2017-08-14 — End: 2017-08-16

## 2017-08-14 NOTE — ED Notes (Signed)
Patient c/o severe abdominal pain, crying and moaning . Notified MD. Morphine 8 mg given and tolerated well.

## 2017-08-14 NOTE — ED Triage Notes (Addendum)
Patient c/o abdominal pain with diarrhea and headache onset 4 days. 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 Patient added gassy feeling .

## 2017-08-14 NOTE — ED Notes (Signed)
IV Cipro stopped. Pt was complaining of itching.

## 2017-08-14 NOTE — Discharge Instructions (Signed)
Follow up with your primary medical doctor for the soft tissue mass behind the umbilicus for further evaluation.

## 2017-08-14 NOTE — ED Notes (Signed)
Accompanied and assisted patient to the restroom and  Stayed with pt for the duration due to feeling weak. Pat declined using a bedside commode. Insisted on going to the restroom.

## 2017-08-14 NOTE — Plan of Care (Addendum)
Adult Observation Progress Note    Shift Note:    General: Patient VSS with exception to HTN, in no apparent distress at this time.  Neuro: Patient is AOx4. Patient c/o numbness from L foot through L shin. Perrla.   Cardio: S1, S2 auscultated.   Resp: Patient on room air with lung sounds clear to diminished bilaterally on auscultation.   Integ: Skin intact, no wounds or edema noted.   MSK: Patient ambulates: independently with: no assistance. low/mod/high: Low falls risk.   GI: Patient is continent of urine.   GU: Bowel sounds auscultated all 4 quadrants.   IV Access: IV: 18g  in: LAC.     No significant events or provider communication. Patient c/o 10/10 headache and 6/10 RLQ abdominal pain at this time, minimal relief from tylenol. Patient c/o nausea and vomiting.    2200: Pt had BM, bright orange + gray, stool pieces oily, steatorrhea, floating in clear oily liquid. Sample sent. Pt reports some relief with morphine.     BM during shift: YES/NO: no    Pending Orders: stool sample, GI    Discharge Plan: To home, pending medical clearance    Social/Family Visits: son at bedside    POC update: pt updated with POC during bedside report      Vitals:    08/14/17 1845 08/14/17 1922   BP: (!) 179/94    Pulse: 62    Resp: 17    Temp: 97.9 F (36.6 C)    TempSrc: Oral    SpO2: 98%    Weight:  76.2 kg (168 lb)   Height:  1.575 m (5\' 2" )       Patient Lines/Drains/Airways Status    Active Lines, Drains and Airways     Name:   Placement date:   Placement time:   Site:   Days:    Peripheral IV 08/14/17 Left Antecubital  08/14/17    0842    Antecubital    less than 1              Problem: Safety  Goal: Patient will be free from injury during hospitalization  Outcome: Progressing   08/14/17 2046   Goal/Interventions addressed this shift   Patient will be free from injury during hospitalization  Assess patient's risk for falls and implement fall prevention plan of care per policy;Provide and maintain safe environment;Ensure  appropriate safety devices are available at the bedside;Include patient/ family/ care giver in decisions related to safety;Hourly rounding;Provide alternative method of communication if needed (communication boards, writing)     Goal: Patient will be free from infection during hospitalization  Outcome: Progressing   08/14/17 2046   Goal/Interventions addressed this shift   Free from Infection during hospitalization Assess and monitor for signs and symptoms of infection;Monitor lab/diagnostic results       Problem: Pain  Goal: Pain at adequate level as identified by patient  Outcome: Progressing   08/14/17 2046   Goal/Interventions addressed this shift   Pain at adequate level as identified by patient Identify patient comfort function goal;Assess for risk of opioid induced respiratory depression, including snoring/sleep apnea. Alert healthcare team of risk factors identified.;Assess pain on admission, during daily assessment and/or before any "as needed" intervention(s);Reassess pain within 30-60 minutes of any procedure/intervention, per Pain Assessment, Intervention, Reassessment (AIR) Cycle;Evaluate if patient comfort function goal is met;Include patient/patient care companion in decisions related to pain management as needed       Problem: Side Effects from Pain Analgesia  Goal: Patient will experience minimal side effects of analgesic therapy  Outcome: Progressing   08/14/17 2046   Goal/Interventions addressed this shift   Patient will experience minimal side effects of analgesic therapy Monitor/assess patient's respiratory status (RR depth, effort, breath sounds);Assess for changes in cognitive function;Prevent/manage side effects per LIP orders (i.e. nausea, vomiting, pruritus, constipation, urinary retention, etc.);Evaluate for opioid-induced sedation with appropriate assessment tool (i.e. POSS)       Problem: Discharge Barriers  Goal: Patient will be discharged home or other facility with appropriate  resources  Outcome: Progressing   08/14/17 2046   Goal/Interventions addressed this shift   Discharge to home or other facility with appropriate resources Provide appropriate patient education;Provide information on available health resources;Initiate discharge planning       Problem: Psychosocial and Spiritual Needs  Goal: Demonstrates ability to cope with hospitalization/illness  Outcome: Progressing   08/14/17 2046   Goal/Interventions addressed this shift   Demonstrates ability to cope with hospitalizations/illness Encourage verbalization of feelings/concerns/expectations;Provide quiet environment;Include patient/ patient care companion in decisions       Problem: Altered GI Function  Goal: Fluid and electrolyte balance are achieved/maintained  Outcome: Progressing   08/14/17 2046   Goal/Interventions addressed this shift   Fluid and electrolyte balance are achieved/maintained Monitor intake and output every shift;Monitor/assess lab values and report abnormal values;Provide adequate hydration;Assess for confusion/personality changes;Monitor for muscle weakness     Goal: Elimination patterns are normal or improving  Outcome: Progressing   08/14/17 2046   Goal/Interventions addressed this shift   Elimination patterns are normal or improving Anticipate/assist with toileting needs;Assess for normal bowel sounds;Monitor for abdominal distension;Report abnormal assessment to physician;Monitor for abdominal discomfort;Assess for signs and symptoms of bleeding. Report signs of bleeding to physician;Reinforce education on foods that improve and complicate bowel movements and how activity and medications can affect bowel movements;Administer medications to improve bowel evacuation as prescribed;Encourage /perform oral hygiene as appropriate     Goal: Nutritional intake is adequate  Outcome: Progressing   08/14/17 2046   Goal/Interventions addressed this shift   Nutritional intake is adequate Monitor daily weights;Assist  patient with meals/food selection;Include patient/patient care companion in decisions related to nutrition;Assess anorexia, appetite, and amount of meal/food tolerated     Goal: Mobility/Activity is maintained at optimal level for patient  Outcome: Progressing   08/14/17 2046   Goal/Interventions addressed this shift   Mobility/activity is maintained at optimal level for patient Increase mobility as tolerated/progressive mobility;Encourage independent activity per ability;Perform active/passive ROM;Reposition patient every 2 hours and as needed unless able to reposition self;Assess for changes in respiratory status, level of consciousness and/or development of fatigue;Consult/collaborate with Physical Therapy and/or Occupational Therapy     Goal: No bleeding  Outcome: Progressing   08/14/17 2046   Goal/Interventions addressed this shift   No bleeding  Monitor and assess vitals and hemodynamic parameters;Monitor/assess lab values and report abnormal values;Assess for bruising/petechia

## 2017-08-14 NOTE — Plan of Care (Signed)
Benadryl IV X 1 for itching around the PIV

## 2017-08-14 NOTE — ED Notes (Signed)
Attempted to call report to [facility]. RN unable to take report at this time. Gave me extension and requested a call back in 10 minutes.

## 2017-08-14 NOTE — ED Notes (Signed)
Patient done with PO contrast.

## 2017-08-14 NOTE — H&P (Signed)
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 Annabelle Harman Portenier, vaginal hysterectomy and cholecystectomy presents with 3 day history of abdominal pain with nausea, vomiting and non-bloody diarrhea. CT A/P showed ileitis and soft tissue mass behind the umbilicus most consistent with benign cyst or mass, but no obstruction and normal appendix. Given persistent abdominal pain and unable to tolerate PO in the setting of a gastric bypass surgery she was admitted for further observation.     Workup  WBC 3.56, H/H 11.1/34.2  AST/ALT 47/37  Lipase <4  UA normal   CT A/P Mild thickening of the distal small bowel near the terminal ileum with stranding in the adjacent mesentery. Findings are consistent with ileitis. No bowel obstruction. Normal appendix.  Small hiatal hernia. Soft tissue mass behind the umbilicus most consistent with a benign cyst or mass  ASSESSMENT:   # Ileitis; afebrile without leukocytosis (inflammatory vs infectious etiology)  # Abdominal pain; CT A/P without appendicitis or bowel obstruction,+ ileitis   # Nausea/Vomiting  # Non-bloody diarrhea  # Intolerant to PO   # Migraine   # Soft Tissue mass behind umbilicus (benign cyst vs mass)- Incidental Finding  # Hiatal Hernia- Incidental finding   # Hypokalemia  # Uncontrolled Hypertension probably from pain/nausea; off antihypertensives at home    Chronic Medical Conditions  Thyroid disease s/p thyroidectomy  GERD  Abnormal LFT  HLD  B/L Carotid Stenosis   OSA  Migraines  S/P hysterectomy 2015  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   S/P cholecystectomy December, 2018    PLAN   Admit to observation   Monitor fever  curve and CBC  Start IV Cipro/Flagyl   Symptomatic management:  Pepcid, PRN acetaminophen, PRN morphine PRN Zofran, PRN phenergan  Continue NS at 100 ml/hr  Consultation left with GANV overnight line   Stool studies  F/U urine culture   Potassium replacement using IV given intolerance to orals  Will need OP follow up for incidental finding of soft tissue mass behind umbilicus and hiatal hernia  TSH and CMP in AM  Check A1C  Hold Synthroid 2/2 nausea and vomiting   Hydralazine PRN      Clear liquid diet   SCD's and Lovenox for DVT prophylaxis  Full Code                                                                                   Please See attending internal medicine note that follows this mid-level encounter note  Disposition:   Today's date: 08/14/2017  Admit Date: 08/14/2017  6:30 PM  Anticipated medical stability for discharge:Yellow - maybe tomorrow  Service status: Observation: Due to abdominal pain, intolerant to orals  Reason  for ongoing hospitalization: see above  Anticipated discharge needs: home with family support   History of Presenting Illness:   Tracy [name] is a 52 year old female with PMHx of 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 Annabelle Harman Portenier, vaginal hysterectomy and cholecystectomy presents with 3 day history of abdominal pain associated with nausea, vomiting (clear emesis)  and non-bloody diarrhea. She is intolerant to food/liquids/PO medications. Abdominal pain is located throughout the entire abdomen but more in the right upper and right lower quadrants. When she stands up or ambulates pain becomes sharp, severe and mostly in the RLQ. Pain improves when she is laying down flat. She reports changes in her bowel movements. Since her gastric bypass surgery and cholecystectomy she has frequent (appx 5 times/day) of liquid stool. However the last 3 days it is still loose but less amount and consists of an oily orange substance. Vomiting has been persistent and clear  liquid emesis started this evening. Everything she takes in she vomits up. She denies fevers, chest pain, shortness of breath, abnormal bleeding including melena. She endorses chills and migraines. Her Bariatric Surgeon is out of [facility] and she has not followed up with him since she has moved to [place].   Past Medical History:     Past Medical History:   Diagnosis Date   . AKI (acute kidney injury)    . Disorder of thyroid    . Fatty liver 2017       Past Surgical History:     Past Surgical History:   Procedure Laterality Date   . CHOLECYSTECTOMY  12/2016   . LAPAROSCOPIC REVISION SLEEVE GASTRECTOMY TO GASTRIC BYPASS       Family History:     Family History   Problem Relation Age of Onset   . Cancer Mother    . Diabetes Mother    . Heart disease Mother    . Heart attack Mother    . Hypertension Mother    . Inflammatory bowel disease Mother    . Cancer Father    . Asthma Father    . Diabetes Father    . Obesity Son      Social History:     Social History     Social History   . Marital status: Significant Other     Spouse name: N/A   . Number of children: N/A   . Years of education: N/A     Social History Main Topics   . Smoking status: Never Smoker   . Smokeless tobacco: Never Used   . Alcohol use No   . Drug use: No   . Sexual activity: Not on file     Other Topics Concern   . Not on file     Social History Narrative   . No narrative on file     Allergies:     Allergies   Allergen Reactions   . Vicodin [Hydrocodone-Acetaminophen] Nausea And Vomiting     Medications:     Prior to Admission medications    Medication Sig Start Date End Date Taking? Authorizing Provider   albuterol (PROVENTIL HFA;VENTOLIN HFA) 108 (90 Base) MCG/ACT inhaler Inhale 2 puffs into the lungs every 4 (four) hours as needed for Wheezing or Shortness of Breath (coughing) Dispense with spacer 07/23/17 08/22/17  Arther Abbott, DO   cetirizine (ZYRTEC) 10 MG tablet Take 1 tablet (10 mg total) by mouth daily 07/23/17   Arther Abbott, DO  levothyroxine (SYNTHROID) 100 MCG tablet Take 100 mcg by mouth 02/12/17   [provider]     Review of Systems:   Pertinent items are noted in HPI.  All other systems were reviewed and negative  Physical Exam:   BP (!) 179/94   Pulse 62   Temp 97.9 F (36.6 C) (Oral)   Resp 17   Ht 1.575 m (5\' 2" )   Wt 76.2 kg (168 lb)   SpO2 98%   BMI 30.73 kg/m   General: awake, alert, oriented x 3; no acute distress.  HEENT: perrla, eomi, sclera anicteric  oropharynx clear without lesions, mucous membranes moist  Neck: supple, no lymphadenopathy, no thyromegaly, no JVD, no carotid bruits  Cardiovascular: regular rate and rhythm, no murmurs, rubs or gallops  Lungs: clear to auscultation bilaterally, without wheezing, rhonchi, or rales  Abdomen: soft, +tenderness RUQ and RLLQ, non-distended; no palpable masses, normoactive bowel sounds, no rebound or guarding  Extremities: no clubbing, cyanosis, or edema  Neuro: cranial nerves grossly intact, strength 5/5 in upper and lower extremities, sensation intact,   Skin: no rashes or lesions noted    Labs/Imaging:   I have Personally Reviewed   Results     ** No results found for the last 24 hours. **          Radiology:   Radiology Results (24 Hour)     ** No results found for the last 24 hours. **        I have discussed with Attending: Nachimuthu   Kathalene Frames, NP  Adult Observation Unit Nurse Practitioner  [phone] or [phone]

## 2017-08-14 NOTE — Progress Notes (Signed)
1840hrs: Received pt from [facility], in no apparent distress, breathing freely on RA. Transferred to bed without difficulty. Oriented to unit. Pt admitted for nausea, vomiting and Abdominal pain; denies n/v vomiting at the moment. Safety measures implemented: bed locked in lowest position, call light and personal articles within reach. 1900hrs: Endorsed to incoming RN for continuity of care.

## 2017-08-14 NOTE — ED Provider Notes (Addendum)
EMERGENCY DEPARTMENT NOTE    Physician/Midlevel provider first contact with patient: 08/14/17 0821         HISTORY OF PRESENT ILLNESS   Historian:Patient  Translator Used: no    Chief Complaint: Abdominal Pain; Diarrhea; and Headache     52 y.o. female sent to the emerge department with 3 days of lower abdominal pain worse on the right associated with nausea and nonbloody diarrhea.  No dysuria hematuria.  No vaginal bleeding or discharge.  No fever chills.  No recent travel or sick contacts.  No antibiotic use of the last 90 days.  Patient history of gastric bypass and cholecystectomy.  No chest pain or shortness of breath.    1. Location of symptoms: Abdomen  2. Onset of symptoms: 3 days prior to arrival  3. What was patient doing when symptoms started (Context): see above  4. Severity: moderate  5. Timing: Constant  6. Activities that worsen symptoms: None  7. Activities that improve symptoms: None  8. Quality: Crampy  9. Radiation of symptoms: no  10. Associated signs and Symptoms: see above  11. Are symptoms worsening? yes  MEDICAL HISTORY     Past Medical History:  Past Medical History:   Diagnosis Date   . Disorder of thyroid        Past Surgical History:  Past Surgical History:   Procedure Laterality Date   . CHOLECYSTECTOMY     . 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 Marital status: Significant Other     Spouse name: N/A   . Number of children: N/A   . Years of education: N/A     Occupational History   . Not on file.     Social History Main Topics   . Smoking status: Never Smoker   . Smokeless tobacco: Never Used   . Alcohol use No   . Drug use: No   . Sexual activity: Not on file     Other Topics Concern   . Not on file     Social History Narrative   . No narrative on file       Family History:  History reviewed. No pertinent family history.    Outpatient Medication:  Previous Medications    ALBUTEROL (PROVENTIL HFA;VENTOLIN  HFA) 108 (90 BASE) MCG/ACT INHALER    Inhale 2 puffs into the lungs every 4 (four) hours as needed for Wheezing or Shortness of Breath (coughing) Dispense with spacer    CETIRIZINE (ZYRTEC) 10 MG TABLET    Take 1 tablet (10 mg total) by mouth daily    LEVOTHYROXINE (SYNTHROID) 100 MCG TABLET    Take 100 mcg by mouth         REVIEW OF SYSTEMS   Review of Systems   Constitutional: Negative for chills and fever.   Respiratory: Negative for cough and shortness of breath.    Cardiovascular: Negative for chest pain and leg swelling.   Gastrointestinal: Positive for abdominal pain, diarrhea and nausea. Negative for blood in stool, constipation and vomiting.   Genitourinary: Negative for dysuria and hematuria.   Musculoskeletal: Negative for back pain and neck pain.   All other systems reviewed and are negative.      PHYSICAL EXAM     ED Triage Vitals [08/14/17 0822]   Enc Vitals Group      BP 159/82  Heart Rate (!) 54      Resp Rate 16      Temp 98 F (36.7 C)      Temp src       SpO2       Weight 72.1 kg      Height 1.575 m      Head Circumference       Peak Flow       Pain Score 10      Pain Loc       Pain Edu?       Excl. in GC?    Nursing note and vitals reviewed.  Constitutional:  Well developed, well nourished.  Awake & alert.    Head:  Atraumatic.  Normocephalic.    ENT:  Mucous membranes are moist and intact.  Patent airway.  Neck:  Supple.  No JVD.   Cardiovascular: Bradycardic rate.  Regular rhythm.   Pulmonary/Chest:  No evidence of respiratory distress.  Clear to auscultation bilaterally.  No wheezing, rales or rhonchi.   Abdominal:  Soft and non-distended.  There is mild lower abd tenderness.  No rebound, guarding, or rigidity.  No bruit. No masses palpable  Back:  No CVA tenderness.   Extremities:  No edema.   No cyanosis.  No clubbing.  2+ DP/PT/radial pulses b/l.  Skin:  Skin is warm and dry.  No diaphoresis.    Neurological:  Alert, awake, and appropriate.  Normal speech.  Moves all extremities.   Normal gate.  Psychiatric:  Good eye contact.  Normal interaction, affect, and behavior      MEDICAL DECISION MAKING     DISCUSSION    CT abdomen pelvis rule out appendicitis rule out bowel obstruction.  CT shows ileitis otherwise negative for acute process.  Patient continues to have ongoing abdominal pain and not tolerating p.o. given surgical history will admit for further observation and evaluation.  Discussed with Dr. Royetta Asal at [facility] who accepts to his service for further care.  Patient stable for transfer and agrees to transfer via PTS. patient informed of CT abnormalities including periumbilical mass which will likely require follow-up after she is discharged from the hospital.  Patient given copy of labs and CT for follow-up with her primary medical doctor.       The patient is NOT septic.  All labs and vital signs from the current visit have been   reviewed and any abnormality that is present is not due to sepsis.    Vital Signs: Reviewed the patient?s vital signs.   Nursing Notes: Reviewed and utilized available nursing notes.  Medical Records Reviewed: Reviewed available past medical records.  Counseling: The emergency provider has spoken with the patient and discussed today?s findings, in addition to providing specific details for the plan of care.  Questions are answered and there is agreement with the plan.        RADIOLOGY IMAGING STUDIES      CT Abd/Pelvis with IV and PO Contrast   Final Result     Mild thickening of the distal small bowel near the terminal   ileum with stranding in the adjacent mesentery. Findings are consistent   with ileitis. No bowel obstruction. No free fluid.   Normal appendix.   Small hiatal hernia.   Soft tissue mass behind the umbilicus most consistent with a benign cyst   or mass. Clinical correlation.      Kinnie Feil, MD    08/14/2017 11:14 AM  PULSE OXIMETRY    Oxygen Saturation by Pulse Oximetry: 98% RA  Interventions: none  Interpretation:  Normal    EMERGENCY DEPT. MEDICATIONS      ED Medication Orders     Start Ordered     Status Ordering Provider    08/14/17 1106 08/14/17 1106  iohexol (OMNIPAQUE) 350 MG/ML injection 100 mL  IMG once as needed     Route: Intravenous  Ordered Dose: 100 mL     Last MAR action:  Imaging Agent Given Sheron Tallman D    08/14/17 1027 08/14/17 1026  ondansetron (ZOFRAN) injection 4 mg  Once     Route: Intravenous  Ordered Dose: 4 mg     Last MAR action:  Given Bethann Berkshire D    08/14/17 0934 08/14/17 0933  morphine injection 8 mg  Once     Route: Intravenous  Ordered Dose: 8 mg     Last MAR action:  Given Bethann Berkshire D    08/14/17 0843 08/14/17 0842  sodium chloride 0.9 % bolus 1,000 mL  Once     Route: Intravenous  Ordered Dose: 1,000 mL     Last MAR action:  Stopped Bethann Berkshire D    08/14/17 0843 08/14/17 0842  ondansetron (ZOFRAN) injection 4 mg  Once     Route: Intravenous  Ordered Dose: 4 mg     Last MAR action:  Given Bethann Berkshire D    08/14/17 0843 08/14/17 0842  famotidine (PEPCID) injection 20 mg  Once     Route: Intravenous  Ordered Dose: 20 mg     Last MAR action:  Given Bethann Berkshire D    08/14/17 0843 08/14/17 0842  morphine injection 4 mg  Once     Route: Intravenous  Ordered Dose: 4 mg     Last MAR action:  Given Locklyn Henriquez D          LABORATORY RESULTS    Ordered and independently interpreted AVAILABLE laboratory tests. Please see results section in chart for full details.  Results for orders placed or performed during the hospital encounter of 08/14/17   CBC with differential   Result Value Ref Range    WBC 3.56 3.10 - 9.50 x10 3/uL    Hgb 11.1 (L) 11.4 - 14.8 g/dL    Hematocrit 16.1 (L) 34.7 - 43.7 %    Platelets 243 142 - 346 x10 3/uL    RBC 3.60 (L) 3.90 - 5.10 x10 6/uL    MCV 95.0 78.0 - 96.0 fL    MCH 30.8 25.1 - 33.5 pg    MCHC 32.5 31.5 - 35.8 g/dL    RDW 15 11 - 15 %    MPV 11.3 8.9 - 12.5 fL    Neutrophils 49.5 None %    Lymphocytes Automated 36.8 None %    Monocytes  8.4 None %    Eosinophils Automated 4.5 None %    Basophils Automated 0.8 None %    Immature Granulocyte 0.0 None %    Nucleated RBC 0.0 0.0 - 0.0 /100 WBC    Neutrophils Absolute 1.76 1.10 - 6.33 x10 3/uL    Abs Lymph Automated 1.31 0.42 - 3.22 x10 3/uL    Abs Mono Automated 0.30 0.21 - 0.85 x10 3/uL    Abs Eos Automated 0.16 0.00 - 0.44 x10 3/uL    Absolute Baso Automated 0.03 0.00 - 0.08 x10 3/uL    Absolute Immature Granulocyte 0.00 0.00 - 0.07 x10 3/uL  Absolute NRBC 0.00 0.00 - 0.00 x10 3/uL   PT/APTT   Result Value Ref Range    PT 14.3 12.6 - 15.0 sec    PT INR 1.1 0.9 - 1.1    PTT 27 23 - 37 sec   Comprehensive metabolic panel   Result Value Ref Range    Glucose 89 70 - 100 mg/dL    BUN 16.1 7.0 - 09.6 mg/dL    Creatinine 1.0 0.6 - 1.0 mg/dL    Sodium 045 409 - 811 mEq/L    Potassium 3.1 (L) 3.5 - 5.1 mEq/L    Chloride 110 100 - 111 mEq/L    CO2 27 22 - 29 mEq/L    Calcium 8.4 (L) 8.5 - 10.5 mg/dL    Protein, Total 5.8 (L) 6.0 - 8.3 g/dL    Albumin 3.6 3.5 - 5.0 g/dL    AST (SGOT) 47 (H) 5 - 34 U/L    ALT 37 0 - 55 U/L    Alkaline Phosphatase 102 37 - 106 U/L    Bilirubin, Total 1.0 0.2 - 1.2 mg/dL    Globulin 2.2 2.0 - 3.6 g/dL    Albumin/Globulin Ratio 1.6 0.9 - 2.2    Anion Gap 8.0 5.0 - 15.0   Lipase   Result Value Ref Range    Lipase <4 (L) 8 - 78 U/L   GFR   Result Value Ref Range    EGFR >60.0    UA with reflex to micro (pts 3 + yrs)   Result Value Ref Range    Urine Type Clean Catch     Color, UA Yellow Colorless - Yellow    Clarity, UA Clear Clear - Hazy    Specific Gravity UA 1.005 1.001 - 1.035    Urine pH 5.0 5.0 - 8.0    Leukocyte Esterase, UA NEGATIVE Negative    Nitrite, UA NEGATIVE Negative    Protein, UR NEGATIVE Negative    Glucose, UA NEGATIVE Negative    Ketones UA NEGATIVE Negative    Urobilinogen, UA <2.0 0.2 - 2.0 mg/dL    Bilirubin, UA NEGATIVE Negative    Blood, UA NEGATIVE Negative   Urine HCG, Qualitative   Result Value Ref Range    Urine HCG Qualitative Negative Negative        CRITICAL CARE/PROCEDURES    Procedures    DIAGNOSIS      Diagnosis:  Final diagnoses:   Intractable abdominal pain   Periumbilical mass       Disposition:  ED Disposition     ED Disposition Condition Date/Time Comment    Admit to [facility]  Fri Aug 14, 2017 12:27 PM Dr. Royetta Asal          Prescriptions:  Patient's Medications   New Prescriptions    No medications on file   Previous Medications    ALBUTEROL (PROVENTIL HFA;VENTOLIN HFA) 108 (90 BASE) MCG/ACT INHALER    Inhale 2 puffs into the lungs every 4 (four) hours as needed for Wheezing or Shortness of Breath (coughing) Dispense with spacer    CETIRIZINE (ZYRTEC) 10 MG TABLET    Take 1 tablet (10 mg total) by mouth daily    LEVOTHYROXINE (SYNTHROID) 100 MCG TABLET    Take 100 mcg by mouth   Modified Medications    No medications on file   Discontinued Medications    No medications on file       This note was generated by the Epic EMR system/ Dragon speech  recognition and may contain inherent errors or omissions not intended by the user. Grammatical errors, random word insertions, deletions and pronoun errors are occasional consequences of this technology due to software limitations. Not all errors are caught or corrected. If there are questions or concerns about the content of this note or information contained within the body of this dictation they should be addressed directly with the author for clarification.     Shela Nevin, MD  08/14/17 1228       Shela Nevin, MD  08/14/17 [phone]

## 2017-08-15 LAB — COMPREHENSIVE METABOLIC PANEL
ALT: 306 U/L — ABNORMAL HIGH (ref 0–55)
AST (SGOT): 515 U/L — ABNORMAL HIGH (ref 5–34)
Albumin/Globulin Ratio: 1.5 (ref 0.9–2.2)
Albumin: 3.2 g/dL — ABNORMAL LOW (ref 3.5–5.0)
Alkaline Phosphatase: 162 U/L — ABNORMAL HIGH (ref 37–106)
BUN: 8 mg/dL (ref 7.0–19.0)
Bilirubin, Total: 0.6 mg/dL (ref 0.2–1.2)
CO2: 25 mEq/L (ref 22–29)
Calcium: 8.5 mg/dL (ref 8.5–10.5)
Chloride: 110 mEq/L (ref 100–111)
Creatinine: 0.9 mg/dL (ref 0.6–1.0)
Globulin: 2.1 g/dL (ref 2.0–3.6)
Glucose: 92 mg/dL (ref 70–100)
Potassium: 3.3 mEq/L — ABNORMAL LOW (ref 3.5–5.1)
Protein, Total: 5.3 g/dL — ABNORMAL LOW (ref 6.0–8.3)
Sodium: 141 mEq/L (ref 136–145)

## 2017-08-15 LAB — HEPATIC FUNCTION PANEL
ALT: 311 U/L — ABNORMAL HIGH (ref 0–55)
AST (SGOT): 377 U/L — ABNORMAL HIGH (ref 5–34)
Albumin/Globulin Ratio: 1.5 (ref 0.9–2.2)
Albumin: 3.7 g/dL (ref 3.5–5.0)
Alkaline Phosphatase: 185 U/L — ABNORMAL HIGH (ref 37–106)
Bilirubin Direct: 0.4 mg/dL (ref 0.0–0.5)
Bilirubin Indirect: 0.4 mg/dL (ref 0.2–1.0)
Bilirubin, Total: 0.8 mg/dL (ref 0.2–1.2)
Globulin: 2.5 g/dL (ref 2.0–3.6)
Protein, Total: 6.2 g/dL (ref 6.0–8.3)

## 2017-08-15 LAB — HEPATITIS PANEL, ACUTE
Hep A IgM: NONREACTIVE
Hepatitis B Core IgM: NONREACTIVE
Hepatitis B Surface Antigen: NONREACTIVE
Hepatitis C, AB: NONREACTIVE

## 2017-08-15 LAB — TSH: TSH: 65.46 u[IU]/mL — ABNORMAL HIGH (ref 0.35–4.94)

## 2017-08-15 LAB — CBC AND DIFFERENTIAL
Absolute NRBC: 0 10*3/uL (ref 0.00–0.00)
Basophils Absolute Automated: 0.03 10*3/uL (ref 0.00–0.08)
Basophils Automated: 0.8 %
Eosinophils Absolute Automated: 0.14 10*3/uL (ref 0.00–0.44)
Eosinophils Automated: 3.7 %
Hematocrit: 31.8 % — ABNORMAL LOW (ref 34.7–43.7)
Hgb: 10.3 g/dL — ABNORMAL LOW (ref 11.4–14.8)
Immature Granulocytes Absolute: 0.01 10*3/uL (ref 0.00–0.07)
Immature Granulocytes: 0.3 %
Lymphocytes Absolute Automated: 1.41 10*3/uL (ref 0.42–3.22)
Lymphocytes Automated: 36.8 %
MCH: 30.9 pg (ref 25.1–33.5)
MCHC: 32.4 g/dL (ref 31.5–35.8)
MCV: 95.5 fL (ref 78.0–96.0)
MPV: 11.4 fL (ref 8.9–12.5)
Monocytes Absolute Automated: 0.33 10*3/uL (ref 0.21–0.85)
Monocytes: 8.6 %
Neutrophils Absolute: 1.91 10*3/uL (ref 1.10–6.33)
Neutrophils: 49.8 %
Nucleated RBC: 0 /100 WBC (ref 0.0–0.0)
Platelets: 217 10*3/uL (ref 142–346)
RBC: 3.33 10*6/uL — ABNORMAL LOW (ref 3.90–5.10)
RDW: 14 % (ref 11–15)
WBC: 3.83 10*3/uL (ref 3.10–9.50)

## 2017-08-15 LAB — STOOL FOR SALMONELLA,SHIGELLA,CAMPYLOBACTER AND SHIGA TOXIN PCR
Stool Campylobacter jejunii/coli by PCR: NEGATIVE
Stool Salmonella Species by PCR: NEGATIVE
Stool Shiga Toxin by PCR: NEGATIVE
Stool Shigella Species/Enteroinvasive Escherichia coli PCR: NEGATIVE

## 2017-08-15 LAB — STOOL FOR WBC
Eosinophils Wright Stain: NONE SEEN
Lymphocytes Wright Stain: NONE SEEN
Neutrophils Wright Stain: NONE SEEN
RBC Wright Stain: NONE SEEN

## 2017-08-15 LAB — T3, FREE: T3, Free: 1.52 pg/mL — ABNORMAL LOW (ref 1.71–3.71)

## 2017-08-15 LAB — HEMOGLOBIN A1C
Average Estimated Glucose: 82.5 mg/dL
Hemoglobin A1C: 4.5 % — ABNORMAL LOW (ref 4.6–5.9)

## 2017-08-15 LAB — PT/INR
PT INR: 1.1 (ref 0.9–1.1)
PT: 14.2 s (ref 12.6–15.0)

## 2017-08-15 LAB — GFR: EGFR: >60

## 2017-08-15 LAB — HEMOLYSIS INDEX: Hemolysis Index: 22 — ABNORMAL HIGH (ref 0–18)

## 2017-08-15 LAB — T4, FREE: T4 Free: 0.56 ng/dL — ABNORMAL LOW (ref 0.70–1.48)

## 2017-08-15 MED ORDER — LEVOTHYROXINE SODIUM 100 MCG PO TABS
125.00 ug | ORAL_TABLET | Freq: Every day | ORAL | Status: DC
Start: 2017-08-15 — End: 2017-08-16
  Administered 2017-08-15: 06:00:00 100 ug via ORAL
  Filled 2017-08-15 (×2): qty 1

## 2017-08-15 MED ORDER — POTASSIUM CHLORIDE 10 MEQ/100ML IV SOLN (WRAP)
10.0000 meq | INTRAVENOUS | Status: AC
Start: 2017-08-15 — End: 2017-08-15
  Administered 2017-08-15 (×3): 10 meq via INTRAVENOUS
  Filled 2017-08-15 (×3): qty 100

## 2017-08-15 NOTE — Progress Notes (Incomplete)
08/15/17 0954   CM Review   Acknowledgment of Outpatient/Observation Observation letter given

## 2017-08-15 NOTE — Progress Notes (Signed)
MEDICINE PROGRESS NOTE    Date Time: 08/15/17 2:43 PM  Patient Name: Tracy [name], Tracy [name]  CZY:[id number]  [id number]/[id number]  Attending Physician: Jerral Ralph, MD    CC: elevated lft's    Interval History/24 hour events: Tracy [name] is a 52 y.o. female with history of gastrectomy followed by gastric bypass, hysterectomy, cholecystectomy and hypothyroidism admitted 08/14/2017 to observation for  abdominal pain, fever.Ua negative, stool negative for wbc. Hemoglobin 10.3. Lipase <4,  CT abd/pel showed ileitis. She was placed on clear liquid diet and started on IV Cipro and flagyl. LFTs elevated: AST/ALT 47/37 to 515/306. GI consulted, agreed with low fat diet, ANA screen, alpha-1-antitrypsin, ceruloplasmin pending. K + level 3.3. She received 3 k riders. Repeat bmp pending. Tsh elevated, Free T3, T4 low. Home synthroid increased from 100 mcg to 125 mcg.  Repeat lfts in the am.     Active Hospital Problems    Diagnosis   . Ileitis   . Abdominal pain   . Nausea & vomiting   . Diarrhea   . Decreased oral intake   . Migraine   . Uncontrolled hypertension   . Abdominal cyst   . Hiatal hernia   . Hypokalemia       Assessment/Plan:   1. 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 Hypokalemia  -K+ level 3.3  -S/p 3 k riders, replete electrolytes as needed  -Repeat bmp pending      3. HTN  -BP 188/102  -D/c IVF, continue hydralazine 10 mg IV Q 6 hrs prn for BP> 180  -BP now  In  150's      4. 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   Review of Systems:   Review of Systems - History obtained from chart review and the patient  Review of Systems   Constitutional: Negative for chills and fever.   Respiratory: Negative for cough.    Cardiovascular: Negative for chest pain, palpitations, orthopnea, claudication and leg swelling.   Gastrointestinal: Positive for abdominal pain. Negative for heartburn, nausea and vomiting.        RUQ   Neurological: Negative for dizziness and headaches.     Physical Exam:   BP (!) 155/91   Pulse Marland Kitchen)  57   Temp 98.1 F (36.7 C) (Oral)   Resp 18   Ht 1.575 m (5\' 2" )   Wt 76.2 kg (168 lb)   SpO2 99%   BMI 30.73 kg/m   Body mass index is 30.73 kg/m.  No intake or output data in the 24 hours ending 08/15/17 1443    Physical Exam   Constitutional: She is oriented to person, place, and time.   Cardiovascular: Normal rate, regular rhythm, normal heart sounds and intact distal pulses.  Exam reveals no friction rub.    No murmur heard.  Pulmonary/Chest: Effort normal and breath sounds normal.   Musculoskeletal: Normal range of motion.   Neurological: She is alert and oriented to person, place, and time.   Nursing note and vitals reviewed.      Meds:     Current Facility-Administered Medications   Medication Dose Route Frequency   . ciprofloxacin  400 mg Intravenous Q12H   . enoxaparin  40 mg  Subcutaneous Daily   . famotidine  20 mg Oral Q12H SCH    Or   . famotidine  20 mg Intravenous Q12H SCH   . levothyroxine  125 mcg Oral Daily at 0600   . metroNIDAZOLE  500 mg Intravenous Q8H     Labs:     Recent Labs      08/15/17   0318  08/14/17   0839   WBC  3.83  3.56   Hgb  10.3*  11.1*   Hematocrit  31.8*  34.2*   Platelets  217  243   MCV  95.5  95.0     Recent Labs      08/15/17   0318  08/14/17   0858   Sodium  141  145   Potassium  3.3*  3.1*   Chloride  110  110   CO2  25  27   BUN  8.0  11.0   Creatinine  0.9  1.0   Glucose  92  89   Calcium  8.5  8.4*     Recent Labs      08/15/17   1237  08/15/17   0318   AST (SGOT)  377*  515*   ALT  311*  306*   Alkaline Phosphatase  185*  162*   Protein, Total  6.2  5.3*   Albumin  3.7  3.2*     Recent Labs      08/15/17   1237  08/14/17   0839   PTT   --   27   PT  14.2  14.3   PT INR  1.1  1.1     Imaging personally reviewed.    Safety Checklist  DVT prophylaxis:  CHEST guideline (See page [id number]) Mechanical   Foley:  [place] Rn Foley protocol Not present   IVs:  Peripheral IV   PT/OT: Not needed   Daily CBC & or Chem ordered:  SHM/ABIM guidelines (see #5) Yes, due to clinical and lab instability     Disposition:   Today's date: 08/15/2017  Admit Date: 08/14/2017  6:30 PM  Anticipated medical stability for discharge:Yellow - maybe tomorrow  Service status: Inpatient non-IMC Status: risk of morbidity and mortality  Reason for ongoing hospitalization: as stated above  Anticipated discharge needs: TBD  I have discussed with Attending: Jerral Ralph, MD  Signed by: Tracy Harrison, FNP-BC  Adult Observation Unit Nurse Practitioner  [phone] or [phone] on NT6   I have  Reviewed the interval history, images and pertinent test results and personally examined the patient and confirmed the major physical findings of the preceding mid-levels note. Agree with above

## 2017-08-15 NOTE — Plan of Care (Addendum)
Medicine Note    LFTs have shot up (AST/ALT 47/37 --> 515/306). CT with fatty liver. Since here - no statin, no excessive tylenol, no hypotension. Will repeat set of labs at 12noon today to trend. Re-ordered 3 Kriders. Also added back home synthroid, dose increased 100 --> 125.

## 2017-08-15 NOTE — Progress Notes (Signed)
08/15/17 0954   CM Review   Acknowledgment of Outpatient/Observation Observation letter given

## 2017-08-15 NOTE — Plan of Care (Addendum)
Adult Observation Progress Note      Shift Note: Patient A&Ox4, breathing freely on RA, in no acute distress. VSS, denies vomiting, report intermittent nausea, states pain 2/10, more so in back of head not requiring pain medication. She states that heating pack relieves her pain. Pt able to perform ADL's and ambulate independently. Potassium  3.3; iv replacement given. Safety in place: bed locked in lowest position, call light and personal articles in reach. Plan of care discussed with patient at length; understanding verbalized. Will continue to monitor.    BM during shift: Yes    Pending Orders: Gi consult, IV fluids, labs, pain control, antiemetics, IV Abx    Discharge Plan: TBD    Social/Family Visits: None    POC update: Pt updated on plan of care       Vitals:    08/15/17 0046 08/15/17 0329 08/15/17 0700 08/15/17 1220   BP: 131/79 131/83 152/83 (!) 155/91   Pulse:  (!) 58 (!) 58 (!) 57   Resp:  18 17 18    Temp:  98.1 F (36.7 C) 97.7 F (36.5 C) 98.1 F (36.7 C)   TempSrc:  Oral Oral Oral   SpO2:  96% 96% 99%   Weight:       Height:           Patient Lines/Drains/Airways Status    Active Lines, Drains and Airways     Name:   Placement date:   Placement time:   Site:   Days:    Peripheral IV 08/14/17 Left Antecubital  08/14/17    0842    Antecubital    1                  Problem: Safety  Goal: Patient will be free from injury during hospitalization  Outcome: Progressing   08/14/17 2046   Goal/Interventions addressed this shift   Patient will be free from injury during hospitalization  Assess patient's risk for falls and implement fall prevention plan of care per policy;Provide and maintain safe environment;Ensure appropriate safety devices are available at the bedside;Include patient/ family/ care giver in decisions related to safety;Hourly rounding;Provide alternative method of communication if needed (communication boards, writing)     Goal: Patient will be free from infection during  hospitalization  Outcome: Progressing   08/14/17 2046   Goal/Interventions addressed this shift   Free from Infection during hospitalization Assess and monitor for signs and symptoms of infection;Monitor lab/diagnostic results       Problem: Pain  Goal: Pain at adequate level as identified by patient  Outcome: Progressing   08/14/17 2046   Goal/Interventions addressed this shift   Pain at adequate level as identified by patient Identify patient comfort function goal;Assess for risk of opioid induced respiratory depression, including snoring/sleep apnea. Alert healthcare team of risk factors identified.;Assess pain on admission, during daily assessment and/or before any "as needed" intervention(s);Reassess pain within 30-60 minutes of any procedure/intervention, per Pain Assessment, Intervention, Reassessment (AIR) Cycle;Evaluate if patient comfort function goal is met;Include patient/patient care companion in decisions related to pain management as needed       Problem: Side Effects from Pain Analgesia  Goal: Patient will experience minimal side effects of analgesic therapy  Outcome: Progressing   08/14/17 2046   Goal/Interventions addressed this shift   Patient will experience minimal side effects of analgesic therapy Monitor/assess patient's respiratory status (RR depth, effort, breath sounds);Assess for changes in cognitive function;Prevent/manage side effects per LIP orders (i.e. nausea, vomiting,  pruritus, constipation, urinary retention, etc.);Evaluate for opioid-induced sedation with appropriate assessment tool (i.e. POSS)       Problem: Discharge Barriers  Goal: Patient will be discharged home or other facility with appropriate resources  Outcome: Progressing   08/14/17 2046   Goal/Interventions addressed this shift   Discharge to home or other facility with appropriate resources Provide appropriate patient education;Provide information on available health resources;Initiate discharge planning       Problem:  Psychosocial and Spiritual Needs  Goal: Demonstrates ability to cope with hospitalization/illness  Outcome: Progressing   08/14/17 2046   Goal/Interventions addressed this shift   Demonstrates ability to cope with hospitalizations/illness Encourage verbalization of feelings/concerns/expectations;Provide quiet environment;Include patient/ patient care companion in decisions       Problem: Altered GI Function  Goal: Fluid and electrolyte balance are achieved/maintained  Outcome: Progressing    Goal: Elimination patterns are normal or improving  Outcome: Progressing   08/14/17 2046   Goal/Interventions addressed this shift   Elimination patterns are normal or improving Anticipate/assist with toileting needs;Assess for normal bowel sounds;Monitor for abdominal distension;Report abnormal assessment to physician;Monitor for abdominal discomfort;Assess for signs and symptoms of bleeding. Report signs of bleeding to physician;Reinforce education on foods that improve and complicate bowel movements and how activity and medications can affect bowel movements;Administer medications to improve bowel evacuation as prescribed;Encourage /perform oral hygiene as appropriate     Goal: Nutritional intake is adequate  Outcome: Progressing   08/14/17 2046   Goal/Interventions addressed this shift   Nutritional intake is adequate Monitor daily weights;Assist patient with meals/food selection;Include patient/patient care companion in decisions related to nutrition;Assess anorexia, appetite, and amount of meal/food tolerated     Goal: Mobility/Activity is maintained at optimal level for patient  Outcome: Progressing   08/14/17 2046   Goal/Interventions addressed this shift   Mobility/activity is maintained at optimal level for patient Increase mobility as tolerated/progressive mobility;Encourage independent activity per ability;Perform active/passive ROM;Reposition patient every 2 hours and as needed unless able to reposition self;Assess  for changes in respiratory status, level of consciousness and/or development of fatigue;Consult/collaborate with Physical Therapy and/or Occupational Therapy     Goal: No bleeding  Outcome: Progressing   08/14/17 2046   Goal/Interventions addressed this shift   No bleeding  Monitor and assess vitals and hemodynamic parameters;Monitor/assess lab values and report abnormal values;Assess for bruising/petechia

## 2017-08-15 NOTE — UM Notes (Signed)
UTILIZATION REVIEW CONTACT: Name: Jiles Prows, RN, BSN, MHA, MBA   PRN Clinical Case Manager  - Utilization Review  [facility]  Address:  [address] [place], [place]  [address]  NPI:   [id number]  Tax ID:  [id number]  Phone: [phone]  Fax: [phone]    Please use fax number [phone] to provide authorization for hospital services or to request additional information.        PATIENT NAME: Tracy [name], Tracy [name]   DOB: Jun 05, 1965   PMH:  has a past medical history of Carotid stenosis; Disorder of thyroid; Essential hypertension; Fatty liver (2017); GERD (gastroesophageal reflux disease); Hypercholesteremia; Migraines; Sleep apnea; and Vitamin D deficiency.  PSH:  has a past surgical history that includes LAPAROSCOPIC REVISION SLEEVE GASTRECTOMY and Cholecystectomy (12/2016).     [facility] Guideline: Abdominal Pain: Observation Care - Observation Care Admission Criteria    INITIAL REVIEW  08/14/17 1850  Place for Observation Services Once    Status:    Question Answer Comment   Admitting Physician Beverly Gust    Diagnosis Ileitis    Estimated Length of Stay < 2 midnights    Tentative Discharge Plan? Home or Self Care    Patient Class Observation            History of present illness: Pt is a 52 y.o. 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 Starleen Arms, vaginal hysterectomy and cholecystectomy who arrived to the ER (08/14/2017 at 1830) with 3 day history of abdominal pain with nausea, vomiting and non-bloody diarrhea    VS: T98, P54, R16, O2 97%, BP 159/82    Physical Exam:   Abdomen: soft, +tenderness RUQ and RLLQ, non-distended; no palpable masses, normoactive bowel sounds, no rebound or guarding    Abnormal Labs: H/H 11.1/342, K 3.1, Ca 8.4, AST 47 > 515, ALT 37 > 306, Alk Phos 102 > 162, TP 5.8, Lipase <4, TSH 65.46, T3 1.52, T4 0.56, A1c 4.5    Diagnostics:  Ct Abd/pelvis With Iv And Po Contrast    Result Date: 08/14/2017    Mild  thickening of the distal small bowel near the terminal ileum with stranding in the adjacent mesentery. Findings are consistent with ileitis. No bowel obstruction. No free fluid. Normal appendix. Small hiatal hernia. Soft tissue mass behind the umbilicus most consistent with a benign cyst or mass. Clinical correlation. Kinnie Feil, MD 08/14/2017 11:14 AM    Assessment:  # Ileitis; afebrile without leukocytosis (inflammatory vs infectious etiology)  # Abdominal pain; CT A/P without appendicitis or bowel obstruction,+ ileitis   # Nausea/Vomiting  # Non-bloody diarrhea  # Intolerant to PO   # Migraine   # Soft Tissue mass behind umbilicus (benign cyst vs mass)- Incidental Finding  # Hiatal Hernia- Incidental finding   # Hypokalemia  # Uncontrolled Hypertension probably from pain/nausea; off antihypertensives at home    Chronic Medical Conditions  Thyroid disease s/p thyroidectomy  GERD  Abnormal LFT  HLD  B/L Carotid Stenosis   OSA  Migraines  S/P hysterectomy 2015  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   S/P cholecystectomy December, 2018    Plan:   Admit to observation   Monitor fever curve and CBC  Start IV Cipro/Flagyl   Symptomatic management:  Pepcid, PRN acetaminophen, PRN morphine PRN Zofran, PRN phenergan  Continue NS at 100 ml/hr  Consultation left with GANV overnight line   Stool studies  F/U urine culture   Potassium replacement using IV given intolerance to orals  Will need OP follow up for incidental finding of soft tissue mass behind umbilicus and hiatal hernia  TSH and CMP in AM  Check A1C  Hold Synthroid 2/2 nausea and vomiting   Hydralazine PRN      Clear liquid diet   SCD's and Lovenox for DVT prophylaxis    ATTENDING ADDENDUM:   Admit to medicine/observation.  Blood culture if spikes fever.  IV Cipro and IV Flagyl.  IV fluids.  Clear liquid diet for now.  Gastroenterology consult in a.m.  Resume home Synthroid.  PRN hydralazine.  PRN Tylenol/narcotic for pain.  Supplement  electrolytes as needed.  DVT prophylaxis.  Umbilical mass follow-up as an outpatient    Dispo: Anticipated medical stability for discharge:Yellow - maybe tomorrow  Service status: Observation: Due to abdominal pain, intolerant to orals  Reason for ongoing hospitalization: see above  Anticipated discharge needs: home with family support        NOTES TO REVIEWER:    This clinical review is based on/compiled from documentation provided by the treatment team within the patient's medical record.

## 2017-08-16 LAB — COMPREHENSIVE METABOLIC PANEL
ALT: 206 U/L — ABNORMAL HIGH (ref 0–55)
AST (SGOT): 171 U/L — ABNORMAL HIGH (ref 5–34)
Albumin/Globulin Ratio: 1.6 (ref 0.9–2.2)
Albumin: 3.3 g/dL — ABNORMAL LOW (ref 3.5–5.0)
Alkaline Phosphatase: 148 U/L — ABNORMAL HIGH (ref 37–106)
BUN: 5 mg/dL — ABNORMAL LOW (ref 7.0–19.0)
Bilirubin, Total: 1 mg/dL (ref 0.2–1.2)
CO2: 24 mEq/L (ref 22–29)
Calcium: 8.8 mg/dL (ref 8.5–10.5)
Chloride: 110 mEq/L (ref 100–111)
Creatinine: 1.1 mg/dL — ABNORMAL HIGH (ref 0.6–1.0)
Globulin: 2.1 g/dL (ref 2.0–3.6)
Glucose: 96 mg/dL (ref 70–100)
Potassium: 3.5 mEq/L (ref 3.5–5.1)
Protein, Total: 5.4 g/dL — ABNORMAL LOW (ref 6.0–8.3)
Sodium: 141 mEq/L (ref 136–145)

## 2017-08-16 LAB — IRON PROFILE
Iron Saturation: 18 % (ref 15–50)
Iron: 53 ug/dL (ref 40–145)
TIBC: 293 ug/dL (ref 265–497)
UIBC: 240 ug/dL (ref 126–382)

## 2017-08-16 LAB — FERRITIN: Ferritin: 29.26 ng/mL (ref 4.60–204.00)

## 2017-08-16 LAB — ALPHA-1-ANTITRYPSIN: Alpha 1-Antitrypsin: 129.9 mg/dL (ref 84.0–200.0)

## 2017-08-16 LAB — IGG: Immunoglobulin G: 899 mg/dL (ref 540–1822)

## 2017-08-16 LAB — CERULOPLASMIN: Ceruloplasmin: 21 mg/dL (ref 20–60)

## 2017-08-16 LAB — HEPATIC FUNCTION PANEL
Bilirubin Direct: 0.3 mg/dL (ref 0.0–0.5)
Bilirubin Indirect: 0.7 mg/dL (ref 0.2–1.0)

## 2017-08-16 LAB — GFR: EGFR: >60

## 2017-08-16 LAB — ACETAMINOPHEN LEVEL: Acetaminophen Level: <7 ug/mL — ABNORMAL LOW (ref 10–30)

## 2017-08-16 LAB — HEMOLYSIS INDEX: Hemolysis Index: 14 (ref 0–18)

## 2017-08-16 MED ORDER — LEVOTHYROXINE SODIUM 100 MCG PO TABS
125.00 ug | ORAL_TABLET | Freq: Every day | ORAL | Status: DC
Start: 2017-08-17 — End: 2017-08-16

## 2017-08-16 MED ORDER — OXYCODONE-ACETAMINOPHEN 5-325 MG PO TABS
1.00 | ORAL_TABLET | Freq: Once | ORAL | Status: AC
Start: 2017-08-16 — End: 2017-08-16
  Administered 2017-08-16: 04:00:00 1 via ORAL
  Filled 2017-08-16: qty 1

## 2017-08-16 MED ORDER — CIPROFLOXACIN HCL 500 MG PO TABS
500.00 mg | ORAL_TABLET | Freq: Two times a day (BID) | ORAL | 0 refills | Status: AC
Start: 2017-08-16 — End: 2017-08-23

## 2017-08-16 MED ORDER — OXYCODONE HCL 5 MG PO TABS
5.0000 mg | ORAL_TABLET | ORAL | 0 refills | Status: AC | PRN
Start: 2017-08-16 — End: 2017-08-23

## 2017-08-16 MED ORDER — CIPROFLOXACIN HCL 500 MG PO TABS
500.00 mg | ORAL_TABLET | Freq: Two times a day (BID) | ORAL | Status: DC
Start: 2017-08-16 — End: 2017-08-16
  Administered 2017-08-16: 11:00:00 500 mg via ORAL
  Filled 2017-08-16: qty 1

## 2017-08-16 MED ORDER — BUTALBITAL-APAP-CAFFEINE 50-325-40 MG PO TABS
1.00 | ORAL_TABLET | Freq: Once | ORAL | Status: AC
Start: 2017-08-16 — End: 2017-08-16
  Administered 2017-08-16: 1 via ORAL
  Filled 2017-08-16: qty 1

## 2017-08-16 MED ORDER — LEVOTHYROXINE SODIUM 25 MCG PO TABS
25.00 ug | ORAL_TABLET | Freq: Once | ORAL | Status: DC
Start: 2017-08-16 — End: 2017-08-16

## 2017-08-16 MED ORDER — OXYCODONE-ACETAMINOPHEN 5-325 MG PO TABS
1.00 | ORAL_TABLET | Freq: Three times a day (TID) | ORAL | Status: DC | PRN
Start: 2017-08-16 — End: 2017-08-16

## 2017-08-16 MED ORDER — METRONIDAZOLE 500 MG PO TABS
500.00 mg | ORAL_TABLET | Freq: Three times a day (TID) | ORAL | 0 refills | Status: AC
Start: 2017-08-16 — End: 2017-08-23

## 2017-08-16 MED ORDER — LEVOTHYROXINE SODIUM 25 MCG PO TABS
125.00 ug | ORAL_TABLET | Freq: Every day | ORAL | Status: DC
Start: 2017-08-16 — End: 2017-08-16
  Filled 2017-08-16 (×3): qty 1

## 2017-08-16 MED ORDER — LEVOTHYROXINE SODIUM 125 MCG PO TABS
125.00 ug | ORAL_TABLET | Freq: Every day | ORAL | 0 refills | Status: DC
Start: 2017-08-17 — End: 2017-10-15

## 2017-08-16 NOTE — Discharge Summary (Signed)
FINAL PROGRESS NOTE/DISCHARGE SUMMARY      Date Time: 08/16/17 10:05 AM  Patient Name: Tracy [name], Tracy [name]  DOB: 08/31/1965  MRN: [id number]  Attending Physician: Jerral Ralph, MD  Primary Care Physician: Marisa Sprinkles, MD  Date of Admission:   08/14/2017  Date of Discharge:    08/16/17   Discharge Dx:   Principal Diagnosis (Diagnosis after study, that is chiefly responsible for admission to observation status): Ileitis    Active Hospital Problems    Diagnosis POA   . Principal Problem: Ileitis Yes   . Abdominal pain Unknown   . Nausea & vomiting Unknown   . Diarrhea Unknown   . Decreased oral intake Unknown   . Migraine Unknown   . Uncontrolled hypertension Unknown   . Abdominal cyst Unknown   . Hiatal hernia Unknown   . Hypokalemia Unknown      Resolved Hospital Problems    Diagnosis POA   No resolved problems to display.     Disposition:  home     Discharge MEDICATIONS        Medication List      START taking these medications    ciprofloxacin 500 MG tablet  Commonly known as:  CIPRO  Take 1 tablet (500 mg total) by mouth 2 (two) times daily for 7 days     metroNIDAZOLE 500 MG tablet  Commonly known as:  FLAGYL  Take 1 tablet (500 mg total) by mouth 3 (three) times daily for 7 days        CHANGE how you take these medications    levothyroxine 125 MCG tablet  Commonly known as:  SYNTHROID, LEVOTHROID  Take 1 tablet (125 mcg total) by mouth Once a day at 6:00am Brand Necessary  Start taking on:  08/17/2017  What changed:   medication strength   how much to take   additional instructions        CONTINUE taking these medications    albuterol 108 (90 Base) MCG/ACT inhaler  Commonly known as:  PROVENTIL HFA;VENTOLIN HFA  Inhale 2 puffs into the lungs every 4 (four) hours as needed for Wheezing or Shortness of Breath (coughing) Dispense with spacer     calcium-vitamin D 500-200 MG-UNIT per tablet  Commonly known as:  OSCAL-500 + D     cetirizine 10 MG tablet  Commonly known as:  ZyrTEC  Take 1 tablet (10 mg total) by mouth  daily     Cyanocobalamin 5000 MCG Subl     multivitamin with minerals tablet           Where to Get Your Medications      You can get these medications from any pharmacy    Bring a paper prescription for each of these medications   ciprofloxacin 500 MG tablet   levothyroxine 125 MCG tablet   metroNIDAZOLE 500 MG tablet       Pending Results:   1. Micro / Labs / Path pending:   Unresulted Labs     Procedure . . . Date/Time    ANA Screen [[id number]] Collected:  08/16/17 0401    Specimen:  Blood Updated:  08/16/17 0533    MITOCHONDRIAL AND ACTIN ANTIBODY IGG [[id number]] Collected:  08/16/17 0401     Updated:  08/16/17 0414    Liver Kidney Microsome (LKM-1) Ab(IgG) [[id number]] Collected:  08/16/17 0401     Updated:  08/16/17 0414    Ova and Parasite Stool/ Smear [[id number]] Collected:  08/14/17 2211     Updated:  08/14/17 2237    Narrative:       Contact laboratory for specimen collection requirements & for requirements for  sources other than feces.             Consultations:   Treatment Team: Attending Provider: Jerral Ralph, MD; Consulting Physician: Jerral Ralph, MD; Consulting Physician: Lestine Mount, MD; Registered Nurse: Olga Coaster, RN   Recent Labs:     Recent Labs  Lab 08/15/17  0318   WBC 3.83   Hgb 10.3*   Hematocrit 31.8*   Platelets 217       Recent Labs  Lab 08/16/17  0401   Sodium 141   Potassium 3.5   Chloride 110   CO2 24   BUN 5.0*   Creatinine 1.1*   EGFR >60.0   Glucose 96   Calcium 8.8         Recent Labs  Lab 08/15/17  0327 08/15/17  0318   TSH  --  65.46*   T3, Free 1.52*  --    T4 Free 0.56*  --       No results found for: T4.                   Microbiology Results     Procedure Component Value Units Date/Time    Ova and Parasite Stool/ Smear [[id number]] Collected:  08/14/17 2211     Updated:  08/14/17 2237    Narrative:       Contact laboratory for specimen collection requirements & for requirements for  sources other than feces.    Stool for Salm/Shig/Campy/Shiga PCR [[id number]]  Collected:  08/14/17 2211    Specimen:  Stool Updated:  08/15/17 1238     Stool Salmonella Species by PCR Negative     Stl Shigella Species/Enteroinvasive Escherichia Coli PCR Negative     Stool Campylobacter Jejunii/Coli by PCR Negative     Stool Shiga Toxin by PCR Negative     Comment: Testing performed using a BDMax multiplex PCR panel which  detects bacterial DNA. A positive result does not necessarily  indicate the presence of viable organisms. Positive results  do not rule out co-infection with other organisms that are  not detected by this test. This panel does not differentiate  between Shigella spp. and Enteroinvasive Escherichia coli  (EIEC), which are closely related and may cause similar  illness.  Serotyping of Salmonella, Shigella, or Shiga Toxin DNA  positive samples will be performed by [facility] for public health  surveillance purposes.  Serotyping results are available upon request.             Procedures/Radiology performed:   Radiology: all results from this admission  Chest 2 Views    Result Date: 07/23/2017       NO SIGNIFICANT ABNORMALITY. Darnelle Maffucci, MD 07/23/2017 12:02 PM    Ct Abd/pelvis With Iv And Po Contrast    Result Date: 08/14/2017    Mild thickening of the distal small bowel near the terminal ileum with stranding in the adjacent mesentery. Findings are consistent with ileitis. No bowel obstruction. No free fluid. Normal appendix. Small hiatal hernia. Soft tissue mass behind the umbilicus most consistent with a benign cyst or mass. Clinical correlation. Kinnie Feil, MD 08/14/2017 11:14 AM    Hospital Course:   Reason for admission/ HPI: Tracy [name] is a 52 y.o. femalewas admitted under observation for Ileitis    Past medical history includes:  has a past medical  history of Carotid stenosis; Disorder of thyroid; Essential hypertension; Fatty liver (2017); GERD (gastroesophageal reflux disease); Hypercholesteremia; Migraines; Sleep apnea; and Vitamin D deficiency.    Hospital Course:    52 year old female admitted on 08/14/17 with a history of a sleeve gastrectomy followed by gastric bypass, hysterectomy, migraine, cholecystectomy, and hypothyroidism presented with abdominal pain, chills, nausea, and nonbloody diarrhea.  CT of the abdomen and pelvis showed mild fatty infiltration of the liver, and mild thickening of the distal small bowel near the terminal ileum with stranding in the adjacent mesentery consistent with ileitis.  White blood cells in the stools were negative, stool for Salm/shiga/campy/shig were negative.  Stool for the ova and parasite are still pending.  She had no leukocytosis and she was afebrile.  On the second day of admission her LFTs increased.  Her AST went from 47 up to 515 and her ALT went from 37 to 306.  GI was consulted and ordered viral hepatitis panel, ANA, AMA, ASMA, LKM, ceruloplamin, A1AT, iron profile, and ferritin.  The iron panel, ferritin, A1 AT, cellular plasm, acetaminophen level were all within normal limits.  The hepatitis panel was negative.  The L KM, ANA, AMA, ASMA are in process.  Patient will continue to follow-up with GI as an outpatient.  It was placed on a low-fat diet and provided IV hydration.  The LFTs trended down overnight.  The AST went from 377 down to 171 and the ALT went from 311 down to 206.  She should try to avoid medications that are processed through the liver.  Will need to avoid alcohol.  The patient has been referred to the transitional care clinic where she will get repeat labs later this week.  He was started on IV Cipro and Flagyl for ileitis and has been able to advance her diet to a low-fat diet.  She will go home on a 7-day course of oral Cipro and Flagyl.  She will continue to follow-up with GI.    #Hypothyroidism  The TSH was 65.46 and the free T4 was 0.56.  The free T3 was 1.52.  Her dose of Synthroid was increased to 125 MCG's daily.  She states that she has to have brand necessary as she has a past history of the generic  form of not working.  We will also need to continue to follow-up with the transitional care clinic regarding this.  I have also referred her to an endocrinologist as an outpatient.    #Normocytic anemia  On admission the H/H was  11.1 and 34.2. MCV 95.5, MCH 30.9, MCHC 32.4.  The iron profile and ferritin level were within normal limits.  The patient has a history of a gastric bypass which is likely the cause of her anemia.  There is no signs of acute bleeding.    #AKI  The patient's creatinine increased to 1.1 overnight.  She is able to stay well-hydrated.  She is avoiding NSAIDs and nephrotoxic agents.  She will follow-up with the transitional care clinic in need to have repeat labs.    She is afebrile and has been able to advance her diet.  Her symptoms have greatly improved from admission.  Currently the patient is hemodynamically stable for discharge with outpatient follow up as outlined below.      Discharge Day Exam:  DISCHARGE DAY EXAM:  BP (!) 174/100   Pulse (!) 51   Temp 98.1 F (36.7 C) (Oral)   Resp 18   Ht 1.575 m (5'  2")   Wt 76.2 kg (168 lb)   SpO2 97%   BMI 30.73 kg/m   Body mass index is 30.73 kg/m.  General: awake, alert, oriented x 3; no acute distress.  Cardiovascular: regular rate and rhythm, no murmurs, rubs or gallops  Lungs: clear to auscultation bilaterally, without wheezing, rhonchi, or rales  Abdomen: soft, non-tender, non-distended; no palpable masses, no hepatosplenomegaly, normoactive bowel sounds, no rebound or guarding  Extremities: no clubbing, cyanosis, or edema    Discharge Instructions:   Diet:: Low Fat  Activity: as tolerated  Discharge Instructions (given to patient):   1) Follow up with the Transitional Discharge Clinic - to help establish a primary care physician.  They should call you tomorrow with an appointment.  If you do not hear from them call and schedule yourself.  2) Follow up with the gastroenterology doctors - to follow up on your liver numbers and the  outstanding blood tests  3) Make an appointment with the endocrinologist - to establish care in the area  4) Antibiotics: Ciprofloxacin twice a day, Flagyl three times a day - take until finish (antibiotics for your intestines infection)  5) Maintain a low fat diet, make sure you stay well hydrated (see hand outs)  6) Your thyroid medication (synthroid) dose was increased to - please take this in the morning  7) While you take these antibiotics - it important that you do NOT drink any alcohol   8) your blood pressure was noted to be elevated periodically while you are in the hospital.  This could be a combination of IV fluids and pain.  I would recommend you check your blood pressure twice daily at home, once in the morning once in the evening.  Record the numbers and bring to your follow-up appointment  9) 7 days of Cipro 500mg  PO BID and 7 days of flagyl 500mg  PO TID.  10)Avoid NSAIDs  Follow-up Information     [facility] Follow up.    Why:  Call to schedule an appointment in the next week - for an after hospital follow up and to help establish a primary care physician   Contact information:  [address]  [place] [address]  [phone]           [facility] Follow up.    Specialty:  Gastroenterology  Why:  Follow up with GI in the next 1-2 weeks  Contact information:  [address]  [place] [address]  [phone]           Jaclyn Shaggy, MD Follow up.    Specialties:  Endocrinology, Diabetes and Metabolism, Internal Medicine  Why:  Follow up with Endocriniology - to establish care in the area  Contact information:  [address]  [place] [address]  [phone]                  Plan discussed with attending physician: Jerral Ralph., MD  Signed by: Brock Bad, FNP-C  [facility]   ADULT OBSERVATION UNIT [phone] F: [phone]  CC: Pcp,  None, MD  Please see attending note that follows this mid-level encounter note.

## 2017-08-16 NOTE — Plan of Care (Addendum)
Adult Observation Progress Note    Update:   Pt requested percocet, states "I talked to Dr. Joni Fears about it multiple times and she said she would put in the order for me." After reviewing previous notes, I saw no mention. RN contacted Raynelle Fanning, NP and received a one-time order for percocet.    Pt states "I can only take the brand name Synthroid, I can not take generic." Raynelle Fanning, NP notified and will review.  RN called pharmacy and they have the brand name, need a modification in the administration note of order to ask for brand name 'synthroid' only.      Shift note: Patient received to shift at 1900. Bedside report received from outgoing RN.  Introduced self to patient and white board updated.  VSS and A&Ox4. No signs of distress at this time.  Pt rates pain 0/10. IV in place, infusing. Fall level low.      BM during shift:  Yes/No: Yes     Pending Orders: IV cipro and flagyl, TCM referral    Discharge Plan:  Pending medical clearance    Social/Family Visits:  n/a    POC update: Patient updated on plan of care, verbalized understanding.       Vitals:    08/15/17 1220 08/15/17 1757 08/15/17 1930 08/16/17 0359   BP: (!) 155/91 160/89 135/79 (!) 153/93   Pulse: (!) 57 (!) 56 (!) 53 62   Resp: 18 18 18 17    Temp: 98.1 F (36.7 C) 98.2 F (36.8 C) 98.6 F (37 C) 97.9 F (36.6 C)   TempSrc: Oral Oral Oral Oral   SpO2: 99% 98% 99% 98%   Weight:       Height:           Patient Lines/Drains/Airways Status    Active Lines, Drains and Airways     Name:   Placement date:   Placement time:   Site:   Days:    Peripheral IV 08/14/17 Left Antecubital  08/14/17    0842    Antecubital    1         Inactive Lines, Drains and Airways     None              Problem: Safety  Goal: Patient will be free from injury during hospitalization  Outcome: Progressing   08/14/17 2046   Goal/Interventions addressed this shift   Patient will be free from injury during hospitalization  Assess patient's risk for falls and implement fall prevention plan of  care per policy;Provide and maintain safe environment;Ensure appropriate safety devices are available at the bedside;Include patient/ family/ care giver in decisions related to safety;Hourly rounding;Provide alternative method of communication if needed (communication boards, writing)     Goal: Patient will be free from infection during hospitalization  Outcome: Progressing   08/14/17 2046   Goal/Interventions addressed this shift   Free from Infection during hospitalization Assess and monitor for signs and symptoms of infection;Monitor lab/diagnostic results       Problem: Pain  Goal: Pain at adequate level as identified by patient  Outcome: Progressing   08/14/17 2046   Goal/Interventions addressed this shift   Pain at adequate level as identified by patient Identify patient comfort function goal;Assess for risk of opioid induced respiratory depression, including snoring/sleep apnea. Alert healthcare team of risk factors identified.;Assess pain on admission, during daily assessment and/or before any "as needed" intervention(s);Reassess pain within 30-60 minutes of any procedure/intervention, per Pain Assessment, Intervention, Reassessment (AIR)  Cycle;Evaluate if patient comfort function goal is met;Include patient/patient care companion in decisions related to pain management as needed       Problem: Side Effects from Pain Analgesia  Goal: Patient will experience minimal side effects of analgesic therapy  Outcome: Progressing   08/14/17 2046   Goal/Interventions addressed this shift   Patient will experience minimal side effects of analgesic therapy Monitor/assess patient's respiratory status (RR depth, effort, breath sounds);Assess for changes in cognitive function;Prevent/manage side effects per LIP orders (i.e. nausea, vomiting, pruritus, constipation, urinary retention, etc.);Evaluate for opioid-induced sedation with appropriate assessment tool (i.e. POSS)       Problem: Discharge Barriers  Goal: Patient will  be discharged home or other facility with appropriate resources  Outcome: Progressing   08/14/17 2046   Goal/Interventions addressed this shift   Discharge to home or other facility with appropriate resources Provide appropriate patient education;Provide information on available health resources;Initiate discharge planning       Problem: Psychosocial and Spiritual Needs  Goal: Demonstrates ability to cope with hospitalization/illness  Outcome: Progressing   08/14/17 2046   Goal/Interventions addressed this shift   Demonstrates ability to cope with hospitalizations/illness Encourage verbalization of feelings/concerns/expectations;Provide quiet environment;Include patient/ patient care companion in decisions       Problem: Altered GI Function  Goal: Fluid and electrolyte balance are achieved/maintained  Outcome: Progressing   08/14/17 2046   Goal/Interventions addressed this shift   Fluid and electrolyte balance are achieved/maintained Monitor intake and output every shift;Monitor/assess lab values and report abnormal values;Provide adequate hydration;Assess for confusion/personality changes;Monitor for muscle weakness     Goal: Elimination patterns are normal or improving  Outcome: Progressing   08/16/17 0447   Goal/Interventions addressed this shift   Elimination patterns are normal or improving Report abnormal assessment to physician;Anticipate/assist with toileting needs;Assess for normal bowel sounds;Monitor for abdominal distension;Monitor for abdominal discomfort;Administer treatments as ordered;Assess for flatus     Goal: Nutritional intake is adequate  Outcome: Progressing   08/14/17 2046   Goal/Interventions addressed this shift   Nutritional intake is adequate Monitor daily weights;Assist patient with meals/food selection;Include patient/patient care companion in decisions related to nutrition;Assess anorexia, appetite, and amount of meal/food tolerated     Goal: Mobility/Activity is maintained at optimal  level for patient  Outcome: Progressing   08/14/17 2046   Goal/Interventions addressed this shift   Mobility/activity is maintained at optimal level for patient Increase mobility as tolerated/progressive mobility;Encourage independent activity per ability;Perform active/passive ROM;Reposition patient every 2 hours and as needed unless able to reposition self;Assess for changes in respiratory status, level of consciousness and/or development of fatigue;Consult/collaborate with Physical Therapy and/or Occupational Therapy     Goal: No bleeding  Outcome: Progressing   08/14/17 2046   Goal/Interventions addressed this shift   No bleeding  Monitor and assess vitals and hemodynamic parameters;Monitor/assess lab values and report abnormal values;Assess for bruising/petechia

## 2017-08-16 NOTE — Progress Notes (Signed)
CASE MANAGEMENT PROGRESS NOTE  Intractable vomiting [R11.10]  Ileitis [K52.9]  CM met with patient at bedside. CM introduced self and role. Patient was receptive to CM visit.   S- 52 yo BF admitted 08/14/17 with abdominal pain, nausea, vomiting, diarrhea. CT Abd/Pel on admission notable for terminal ileitis and fatty liver. Her LFTs were WNL on admission however next day shot up to AST/AT 515/306 - unclear etiology. GI consulted. Symptoms are improved, work up underway. Pt is alert and oriented x3.  B- Pt stated that she lives with her son in [place] and she was independent prior to hospital visit. Pt and her son are in transition to move to [place]. She has no DMEs at home. Pt stated that she had not been to a rehab center before or utilized any HH services before. Pt has no payer source. Pt stated that she is in transition of applying Medicare and Medicaid. Pt drives self. Demographics and PCP were verified.   A- Poor family support. Lack of insurance.    R- Pt will still be able to return home by herself or with son.     Anticipated DISPO: Home with family    Patient and family advised that patient will soon be medically cleared by physician and can transition to a lower level of post-hospital care. Discussed options for post-acute care, including Home with Home Health, Acute Rehab vs. SNF placement. Case management will continue to work with patient and family to finalize discharge plan and disposition.      08/16/17 0755   Patient Type   Within 30 Days of Previous Admission? No   Healthcare Decisions   Interviewed: Patient   Orientation/Decision Making Abilities of Patient Alert and Oriented x3, able to make decisions   Advance Directive Patient has advance directive, copy not in chart   Healthcare Agent Appointed No   Prior to admission   Prior level of function Independent with ADLs;Ambulates independently   Type of Residence Private residence   Home Layout One level   Have running water, electricity, heat, etc?  Yes   Living Arrangements Children   How do you get to your [place] appointments? self   Who picks up your prescriptions? self   Dressing Independent   Grooming Independent   Feeding Independent   Bathing Independent   Toileting Independent   DME Currently at Home (na)   Name of Prior Assisted Living Facility na   Prior SNF admission? (Detail) na   Prior Rehab admission? (Detail) na   Adult Protective Services (APS) involved? No   Discharge Planning   Support Systems Children   Patient expects to be discharged to: Home   Anticipated Center plan discussed with: Same as interviewed   Potential barriers to discharge: Testing/procedure   Mode of transportation: Private car (family member)   Consults/Providers   PT Evaluation Needed 2   OT Evalulation Needed 2   SLP Evaluation Needed 2   Outcome Palliative Care Screen Screened but did not meet criteria for intervention   Correct PCP listed in Epic? No (comment)   PCP   PCP on file was verified as the current PCP? Patient/family states they do not have a PCP   Important Message from Medicare Notice   Patient received 1st IMM Letter? n/a       Debbe Bales RN, BSN  Clinical Case Manager  [facility]  [phone]

## 2017-08-16 NOTE — Plan of Care (Signed)
Problem: Safety  Goal: Patient will be free from injury during hospitalization  Outcome: Progressing   08/16/17 5956   Goal/Interventions addressed this shift   Patient will be free from injury during hospitalization  Assess patient's risk for falls and implement fall prevention plan of care per policy;Provide and maintain safe environment;Use appropriate transfer methods;Ensure appropriate safety devices are available at the bedside;Include patient/ family/ care giver in decisions related to safety;Hourly rounding     Goal: Patient will be free from infection during hospitalization  Outcome: Progressing   08/16/17 0937   Goal/Interventions addressed this shift   Free from Infection during hospitalization Assess and monitor for signs and symptoms of infection;Monitor lab/diagnostic results       Problem: Pain  Goal: Pain at adequate level as identified by patient  Outcome: Progressing   08/16/17 0937   Goal/Interventions addressed this shift   Pain at adequate level as identified by patient Identify patient comfort function goal;Assess pain on admission, during daily assessment and/or before any "as needed" intervention(s);Reassess pain within 30-60 minutes of any procedure/intervention, per Pain Assessment, Intervention, Reassessment (AIR) Cycle;Evaluate if patient comfort function goal is met;Evaluate patient's satisfaction with pain management progress;Offer non-pharmacological pain management interventions       Problem: Side Effects from Pain Analgesia  Goal: Patient will experience minimal side effects of analgesic therapy  Outcome: Progressing   08/16/17 0937   Goal/Interventions addressed this shift   Patient will experience minimal side effects of analgesic therapy Monitor/assess patient's respiratory status (RR depth, effort, breath sounds);Assess for changes in cognitive function;Prevent/manage side effects per LIP orders (i.e. nausea, vomiting, pruritus, constipation, urinary retention, etc.)        Problem: Discharge Barriers  Goal: Patient will be discharged home or other facility with appropriate resources  Outcome: Progressing   08/16/17 0937   Goal/Interventions addressed this shift   Discharge to home or other facility with appropriate resources Provide appropriate patient education;Provide information on available health resources;Initiate discharge planning       Problem: Psychosocial and Spiritual Needs  Goal: Demonstrates ability to cope with hospitalization/illness  Outcome: Progressing   08/16/17 3875   Goal/Interventions addressed this shift   Demonstrates ability to cope with hospitalizations/illness Encourage verbalization of feelings/concerns/expectations;Provide quiet environment;Assist patient to identify own strengths and abilities;Encourage patient to set small goals for self;Encourage participation in diversional activity;Reinforce positive adaptation of new coping behaviors;Include patient/ patient care companion in decisions       Problem: Altered GI Function  Goal: Fluid and electrolyte balance are achieved/maintained  Outcome: Progressing   08/16/17 0937   Goal/Interventions addressed this shift   Fluid and electrolyte balance are achieved/maintained Monitor intake and output every shift;Monitor/assess lab values and report abnormal values;Provide adequate hydration;Assess for confusion/personality changes;Assess and reassess fluid and electrolyte status;Observe for cardiac arrhythmias;Monitor for muscle weakness     Goal: Elimination patterns are normal or improving  Outcome: Progressing   08/16/17 6433   Goal/Interventions addressed this shift   Elimination patterns are normal or improving Report abnormal assessment to physician;Anticipate/assist with toileting needs;Assess for normal bowel sounds;Monitor for abdominal distension;Monitor for abdominal discomfort;Administer treatments as ordered;Assess for flatus;Administer medications to improve bowel evacuation as  prescribed;Encourage /perform oral hygiene as appropriate     Goal: Nutritional intake is adequate  Outcome: Progressing   08/16/17 2951   Goal/Interventions addressed this shift   Nutritional intake is adequate Assist patient with meals/food selection;Allow adequate time for meals;Encourage/perform oral hygiene as appropriate;Include patient/patient care companion in decisions related to  nutrition;Assess anorexia, appetite, and amount of meal/food tolerated     Goal: Mobility/Activity is maintained at optimal level for patient  Outcome: Progressing   08/16/17 0937   Goal/Interventions addressed this shift   Mobility/activity is maintained at optimal level for patient Increase mobility as tolerated/progressive mobility;Encourage independent activity per ability;Maintain proper body alignment;Perform active/passive ROM;Plan activities to conserve energy, plan rest periods;Reposition patient every 2 hours and as needed unless able to reposition self;Assess for changes in respiratory status, level of consciousness and/or development of fatigue     Goal: No bleeding  Outcome: Progressing   08/16/17 9528   Goal/Interventions addressed this shift   No bleeding  Monitor and assess vitals and hemodynamic parameters;Monitor/assess lab values and report abnormal values       Problem: Anxiety  Goal: Anxiety is at a manageable level  Outcome: Progressing   08/16/17 4132   Goal/Interventions addressed this shift   Anxiety is at a manageable level Orient to unit;Inform/explain to patient/patient care companion all tests/procedures/treatment/care prior to initiation;Facilitate expression of feelings, fears, concerns, anxiety;Assess emotional status and coping mechanisms;Provide alternatives to reduce anxiety;Provide and maintain a safe environment;Include patient/patient care companion in decisions related to anxiety/depression;Provide emotional support;Assist in developing anxiety-reducing skills;Encourage participation in care

## 2017-08-16 NOTE — Progress Notes (Signed)
SHIFT NOTE:    Assumed care of patient at 0730 from outgoing RN. Pt resting in bed, complains of headache in occipital region.     Neuro: A&Ox4, PERRLA. Complains of dizziness.   Resp: LCTAB  Cardio: No tele. All peripheral pulses +2, cap refill <3 sec, no edema.  Skin: Clean, dry, intact. 20h PIV in RFA infusing NS 64mL/hr.   MS: Pt ambulates independently. Low falls risk.   GI: BS active x4, abdomen soft and nontender. Last BM 08/16/17   GU: WDL    Plan of care:  Switch from IV to PO abx, d/c home today.

## 2017-08-16 NOTE — Progress Notes (Signed)
Patient stable for discharge.  Pt given work note, prescriptions for Cipro, flagyl, and oxycodone. Patient instructed on how to take and verbalized understanding. Pt instructed to take BP at home, verified she has BP machine at home, and to follow up with TCM, GI and endocrinology. Pt verbalized understanding. IV removed intact. Pt transported via wheelchair to ED with belongings, to be driven home by family member.

## 2017-08-16 NOTE — Consults (Signed)
CASE MANAGEMENT PROGRESS NOTE    TCM clinic order sent. CM left VM for TCM clinic to call pt for follow up appointment.    Debbe Bales RN, BSN  Clinical Case Manager  [facility]  [phone]

## 2017-08-16 NOTE — Discharge Instructions (Signed)
Low-Fat Diet    A low-fat diet will help you lose weight. It also can lower cholesterol and prevent symptoms of gallbladder disease. The average American diet contains up to 50% fat. This means that 50% of all calories come from fat (about 80 grams to 100 grams of fat per day). Choosing normal portions of foods from the list below can help lower your fat intake. Experts recommend that only 20% to 35% of your daily calories come from fat. The remaining 65% to 80% of calories will come from protein and carbohydrates. This is much healthier for you.  Breads  Ok: Whole-wheat or rye bread, graham or soda crackers, melba toast, plain rolls, bagels, English muffins  Avoid: Rolls and breads containing whole milk or egg; waffles, pancakes, biscuits, corn bread; cheese crackers, other flavored crackers, pastries, doughnuts  Cereals  Ok: Oatmeal, whole-wheat, bran, multigrain, rice  Avoid: Granola or other cereals that have oil, coconut, or more than 2 grams of fat per serving  Cheese and eggs  Ok: Cheeses labeled low-fat; 3 whole eggs per week; egg whites and egg substitutes as desired  Avoid: All other cheeses  Desserts  Ok: Gelatin, slushy, angel food cake, meringues, nonfat yogurt, and puddings or sherbet made with nonfat milk  Avoid: Any other store-bought desserts, or desserts that have fat, whole milk, cream, chocolate, and coconut  Drinks  Ok: Nonfat milk, coffee, tea, fizzy (carbonated) drinks  Avoid: Whole and reduced-fat milk, evaporated and condensed milk, hot chocolate mixes, milk shakes, malts, eggnog  Fats  Ok: You may have up to 3 teaspoons of fat daily. This can be butter, margarine, mayonnaise, or healthy oils (canola or olive)  Avoid: Cream, nondairy creams, cream cheese, gravies, and cream sauces  Fruits  Ok: All fruits made without fat  Avoid: Coconut, olives  Meats, poultry, fish  Ok: Limit meat to 6 ounces daily (broiled, roasted, baked, grilled, or boiled). Buy lean cuts, and trim off the fat. Try  beef, fish, lamb, pork, and canned fish packed in water; also chicken and turkey with the skin removed.  Avoid: Fried meats, fish, or poultry; fried eggs, and fish canned in oils; fatty meats such as bacon, sausage, corned beef, hot dogs, and lunch meats; meats with gravies and sauces  Potatoes, beans, pasta  Ok: Dried beans, split peas, lentils, potatoes, rice, pasta made without added fat  Avoid: French fries, potato chips, potatoes prepared with butter, refried beans  Soups  Ok: Clear broth soups without fat and with allowed vegetables  Avoid: Cream-based soups  Vegetables  Ok: Fresh, frozen, canned or dried vegetables, all made without added fat  Avoid: Fried vegetables and those prepared with butter, cream, sauces  Miscellaneous  Ok: Salt, sugar, jelly, hard candy, marshmallows, honey, syrup, spices and herbs, mustard, ketchup, lemon, and vinegar. Try to limit sweets and added sugars.  Avoid: Chocolate, nuts, coconut, and cream candies; sunflower, sesame, and other seeds; fried foods; cream sauces and gravies; pizza  Date Last Reviewed: 08/07/2014   2000-2019 [employer] [address], [place] [address]. All rights reserved. This information is not intended as a substitute for professional medical care. Always follow your healthcare professional's instructions.

## 2017-08-16 NOTE — Discharge Instr - AVS First Page (Addendum)
Reason for your Hospital Admission:  Ileitis      Instructions for after your discharge:  1) Follow up with [facility] - to help establish a primary care physician.  They should call you tomorrow with an appointment.  If you do not hear from them call and schedule yourself.  2) Follow up with the gastroenterology doctors - to follow up on your liver numbers and the outstanding blood tests  3) Make an appointment with the endocrinologist - to establish care in the area  4) Antibiotics: Ciprofloxacin twice a day, Flagyl three times a day - take until finish (antibiotics for your intestines infection)  5) Maintain a low fat diet, make sure you stay well hydrated (see hand outs)  6) Your thyroid medication (synthroid) dose was increased to - please take this in the morning  7) While you take these antibiotics - it important that you do NOT drink any alcohol   8) your blood pressure was noted to be elevated periodically while you are in the hospital.  This could be a combination of IV fluids and pain.  I would recommend you check your blood pressure twice daily at home, once in the morning once in the evening.  Record the numbers and bring to your follow-up appointment.  9) Avoid NSAIDs (aleve, motrin, ibuprofen, aleve, advil, toradol) until you have your labs recheck to ensure you kidneys are working normally.     Return to the nearest emergency department for chest pain, shortness of breath, fever >100.5 degrees, dizziness, syncope, profuse vomiting or diarrhea, or any other concerning symptoms.       Understanding Colitis  Colitis is when a part of your colon becomes inflamed or swollen. The colon is also called the large intestine. It helps with digestion and waste removal.  What causes colitis?  Colitis can be caused by many things. The most common causes are:   Viral or bacterial infections   Inflammatory bowel disease (ulcerative colitis or Crohn's disease)   Certain  medicines, such as antibiotics   Radiation therapy to the colon  Symptoms of colitis  The symptoms of colitis may last a short time. Or they can be chronic. The most common symptoms are:   Diarrhea, sometimes bloody   Stomach pain or cramping   Fever   Weight loss in severe cases  Diagnosing colitis  Your healthcare provider will take a full health history and family history. He or she will also give you a physical exam. Depending on the results of your history and physical exam, your provider may also order certain tests to help find out the cause of your colitis. These may include:   Lab tests. Your blood and stool will be checked.   Endoscopy and biopsy. Endoscopy uses a long, flexible tube with a tiny light and camera on one end to check the inside of your large intestine. When only the colon is checked, this is called a colonoscopy. During an endoscopy, your provider may take a small sample of your tissue to look at under a microscope. This is called a biopsy.  Treatment for colitis  Treatment for colitis depends on what is causing it and how serious your symptoms are. In some cases, you may not need treatment. For example, colitis from an infection may go away without care.  Treatment may include:   Medicines. You may take these by mouth (oral) or as a rectal suppository or enema. They can lessen swelling and ease  symptoms.   Changes in your diet. Some foods can make symptoms worse. Common triggers are milk, coffee, alcohol, and fried foods.   Surgery. In some cases, you may need surgery to remove a damaged part of the colon.    Call 911  Call 911 if any of these occur:   Trouble breathing   Confusion   Very drowsy or trouble awakening   Fainting or loss of consciousness   Rapid heart rate   Chest pain   When to call your healthcare provider  Call your healthcare provider right away if you have any of these:   Symptoms that don't get better, or get worse   Fever of 100.62F (38C) or  higher, or as directed by your healthcare provider   Pain that gets worse   Bloody diarrhea   Bleeding from your rectum   New symptoms    Date Last Reviewed: 12/07/2015   2000-2019 [employer], [place]. [address], [place], [place] [address]. All rights reserved. This information is not intended as a substitute for professional medical care. Always follow your healthcare professional's instructions.      Ciprofloxacin tablets  Brand Name: Cipro  What is this medicine?  CIPROFLOXACIN (sip roe FLOX a sin) is a quinolone antibiotic. It is used to treat certain kinds of bacterial infections. It will not work for colds, flu, or other viral infections.  How should I use this medicine?  Take this medicine by mouth with a full glass of water. Follow the directions on the prescription label. You can take it with or without food. If it upsets your stomach, take it with food. Take your medicine at regular intervals. Do not take your medicine more often than directed. Take all of your medicine as directed even if you think you are better. Do not skip doses or stop your medicine early.  Avoid antacids, aluminum, calcium, iron, magnesium, and zinc products for 6 hours before and 2 hours after taking a dose of this medicine.  A special MedGuide will be given to you by the pharmacist with each prescription and refill. Be sure to read this information carefully each time.  Talk to your pediatrician regarding the use of this medicine in children. Special care may be needed.  What side effects may I notice from receiving this medicine?  Side effects that you should report to your doctor or health care professional as soon as possible:   allergic reactions like skin rash or hives, swelling of the face, lips, or tongue   anxious   back pain   bloody or watery diarrhea   chest pain   confusion   depressed mood   fast, irregular heartbeat   fever   hallucination, loss of contact with reality   joint, muscle, or  tendon pain or swelling   loss of memory   pain, tingling, numbness in the hands or feet   seizures   signs and symptoms of high blood sugar such as dizziness; dry mouth; dry skin; fruity breath; nausea; stomach pain; increased hunger or thirst; increased urination   signs and symptoms of liver injury like dark yellow or [place] urine; general ill feeling or flu-like symptoms; light-colored stools; loss of appetite; nausea; right upper belly pain; unusually weak or tired; yellowing of the eyes or skin   signs and symptoms of low blood sugar such as feeling anxious; confusion; dizziness; increased hunger; unusually weak or tired; sweating; shakiness; cold; irritable; headache; blurred vision; fast heartbeat; loss  of consciousness; pale skin   stomach pain   suicidal thoughts or other mood changes   sunburn   unusually weak or tired  Side effects that usually do not require medical attention (report to your doctor or health care professional if they continue or are bothersome):   dry mouth   headache   nausea   trouble sleeping  What may interact with this medicine?  Do not take this medicine with any of the following medications:   cisapride   dofetilide   dronedarone   flibanserin   lomitapide   pimozide   thioridazine   tizanidine   ziprasidone  This medicine may also interact with the following medications:   antacids   birth control pills   caffeine   certain medicines for diabetes, like glipizide, glyburide, or insulin   certain medicines that treat or prevent blood clots like warfarin   clozapine   cyclosporine   didanosine buffered tablets or powder   duloxetine   lanthanum carbonate   lidocaine   methotrexate   multivitamins   NSAIDS, medicines for pain and inflammation, like ibuprofen or naproxen   olanzapine   omeprazole   other medicines that prolong the QT interval (cause an abnormal heart  rhythm)   phenytoin   probenecid   ropinirole   sevelamer   sildenafil   sucralfate   theophylline   zolpidem  What if I miss a dose?  If you miss a dose, take it as soon as you can. If it is almost time for your next dose, take only that dose. Do not take double or extra doses.  Where should I keep my medicine?  Keep out of the reach of children.  Store at room temperature below 30 degrees C (86 degrees F). Keep container tightly closed. Throw away any unused medicine after the expiration date.  What should I tell my health care provider before I take this medicine?  They need to know if you have any of these conditions:   bone problems   diabetes   heart disease   high blood pressure   history of irregular heartbeat   history of low levels of potassium in the blood   joint problems   kidney disease   myasthenia gravis   seizures   tendon problems   tingling of the fingers or toes, or other nerve disorder   an unusual or allergic reaction to ciprofloxacin, other antibiotics or medicines, foods, dyes, or preservatives   pregnant or trying to get pregnant   breast-feeding  What should I watch for while using this medicine?  Tell your doctor or healthcare professional if your symptoms do not start to get better or if they get worse.  Do not treat diarrhea with over the counter products. Contact your doctor if you have diarrhea that lasts more than 2 days or if it is severe and watery.  Check with your doctor or health care professional if you get an attack of severe diarrhea, nausea and vomiting, or if you sweat a lot. The loss of too much body fluid can make it dangerous for you to take this medicine.  This medicine may affect blood sugar levels. If you have diabetes, check with your doctor or health care professional before you change your diet or the dose of your diabetic medicine.  You may get drowsy or dizzy. Do not drive, use machinery, or do anything that needs mental alertness until you  know how this medicine affects  you. Do not sit or stand up quickly, especially if you are an older patient. This reduces the risk of dizzy or fainting spells.  This medicine can make you more sensitive to the sun. Keep out of the sun. If you cannot avoid being in the sun, wear protective clothing and use a sunscreen. Do not use sun lamps or tanning beds/booths.  NOTE:This sheet is a summary. It may not cover all possible information. If you have questions about this medicine, talk to your doctor, pharmacist, or health care provider. Copyright 2019 [employer]      Metronidazole tablets or capsules  Brand Name: Flagyl  What is this medicine?  METRONIDAZOLE (me troe NI da zole) is an antiinfective. It is used to treat certain kinds of bacterial and protozoal infections. It will not work for colds, flu, or other viral infections.  How should I use this medicine?  Take this medicine by mouth with a full glass of water. Follow the directions on the prescription label. Take your medicine at regular intervals. Do not take your medicine more often than directed. Take all of your medicine as directed even if you think you are better. Do not skip doses or stop your medicine early.  Talk to your pediatrician regarding the use of this medicine in children. Special care may be needed.  What side effects may I notice from receiving this medicine?  Side effects that you should report to your doctor or health care professional as soon as possible:   allergic reactions like skin rash or hives, swelling of the face, lips, or tongue   confusion   fast, irregular heartbeat   fever, chills, sore throat   fever with rash, swollen lymph nodes, or swelling of the face   pain, tingling, numbness in the hands or feet   redness, blistering, peeling or loosening of the skin, including inside the mouth   seizures   sign and symptoms of liver injury like dark yellow or [place] urine; general ill feeling or flu-like symptoms; light colored  stools; loss of appetite; nausea; right upper belly pain; unusually weak or tired; yellowing of the eyes or skin   vaginal discharge, itching, or odor in women  Side effects that usually do not require medical attention (report to your doctor or health care professional if they continue or are bothersome):   changes in taste   diarrhea   headache   nausea, vomiting   stomach pain  What may interact with this medicine?  Do not take this medicine with any of the following medications:   alcohol or any product that contains alcohol   cisapride   disulfiram   dofetilide   dronedarone   pimozide   thioridazine   ziprasidone  This medicine may also interact with the following medications:   amiodarone   birth control pills   busulfan   carbamazepine   cimetidine   cyclosporine   fluorouracil   lithium   other medicines that prolong the QT interval (cause an abnormal heart rhythm)   phenobarbital   phenytoin   quinidine   tacrolimus   vecuronium   warfarin  What if I miss a dose?  If you miss a dose, take it as soon as you can. If it is almost time for your next dose, take only that dose. Do not take double or extra doses.  Where should I keep my medicine?  Keep out of the reach of children.  Store at room temperature below 25  degrees C (77 degrees F). Protect from light. Keep container tightly closed. Throw away any unused medicine after the expiration date.  What should I tell my health care provider before I take this medicine?  They need to know if you have any of these conditions:   [place] syndrome   history of blood diseases, like sickle cell anemia or leukemia   history of yeast infection   if you often drink alcohol   liver disease   an unusual or allergic reaction to metronidazole, nitroimidazoles, or other medicines, foods, dyes, or preservatives   pregnant or trying to get pregnant   breast-feeding  What should I watch for while using this medicine?  Tell your doctor or  health care professional if your symptoms do not improve or if they get worse.  You may get drowsy or dizzy. Do not drive, use machinery, or do anything that needs mental alertness until you know how this medicine affects you. Do not stand or sit up quickly, especially if you are an older patient. This reduces the risk of dizzy or fainting spells.  Ask your doctor or health care professional if you should avoid alcohol. Many nonprescription cough and cold products contain alcohol. Metronidazole can cause an unpleasant reaction when taken with alcohol. The reaction includes flushing, headache, nausea, vomiting, sweating, and increased thirst. The reaction can last from 30 minutes to several hours.  If you are being treated for a sexually transmitted disease, avoid sexual contact until you have finished your treatment. Your sexual partner may also need treatment.  NOTE:This sheet is a summary. It may not cover all possible information. If you have questions about this medicine, talk to your doctor, pharmacist, or health care provider. Copyright 2019 [employer]        Taking Your Blood Pressure  Blood pressure is the force of blood against the artery wall as it moves from the heart through the blood vessels. You can take your own blood pressure reading using a digital monitor. Take your readings the same each time, using the same arm. Take readings as often as your healthcare provider instructs.  About blood pressure monitors  Blood pressure monitors are designed for certain ages and cases. You can find monitors for older adults, for pregnant women, and for children. Make sure the one you choose is the right one for your age and situation.  The American Heart Association recommends an automatic cuff monitor that fits on your upper arm (bicep). The cuff should fit your arm size. A cuff that's too large or too small will not give an accurate reading. Measure around your upper arm to find your size.  Monitors that attach  to your finger or wrist are not as accurate as monitors for your upper arm.  Ask your healthcare provider for help in choosing a monitor. Bring your monitor to your next provider visit if you need help in using it the correct way.  The steps below are general instructions for using an automatic digital monitor.  Step 1. Relax     Take your blood pressure at the same time every day, such as in the morning or evening, or at the time your healthcare provider recommends.   Wait at least a half-hour after smoking, eating, or exercising. Don't drink coffee, tea, soda, or other caffeinated beverages before checking your blood pressure.   Sit comfortably at a table with both feet on the floor. Do not cross your legs or feet. Place the monitor near  you.   Rest for a few minutes before you begin.  Step 2. Wrap the cuff     Place your arm on the table, palm up. Your arm should be at the level of your heart. Wrap the cuff around your upper arm, just above your elbow. It's best done on bare skin, not over clothing. Most cuffs will indicate where the brachial artery (the blood vessel in the middle of the arm at the inner side of the elbow) should line up with the cuff. Look in your monitor's instruction booklet for an illustration. You can also bring your cuff to your healthcare provider and have them show you how to correctly place the cuff.  Step 3. Inflate the cuff     Push the button that starts the pump.   The cuff will tighten, then loosen.   The numbers will change. When they stop changing, your blood pressure reading will appear.   Take 2 or 3 readings one minute apart.  Step 4. Write down the results of each reading     Write down your blood pressure numbers for each reading. Note the date and time. Keep your results in one place, such as a notebook. Even if your monitor has a built-in memory, keep a hard copy of the readings.   Remove the cuff from your arm. Turn off the machine.   Bring your blood pressure  records with your healthcare providers at each visit.   If you start a new blood pressure medicine, note the day you started the new medicine. Also note the day if you change the dose of your medicine. This information goes on your blood pressure recording sheet. This will help your healthcare provider monitor how well the medicine changes are working.   Ask your healthcare provider what numbers should prompt you to call him or her. Also ask what numbers should prompt you to get help right away.  Date Last Reviewed: 11/07/2014   2000-2019 [employer], [place]. [address] [place], [place] [address]. All rights reserved. This information is not intended as a substitute for professional medical care. Always follow your healthcare professional's instructions.

## 2017-08-16 NOTE — Consults (Signed)
[facility]   CONSULTATION NOTE  [facility] call: [phone], [phone]  [facility] call: [phone]  After hours call [phone]        Date Time: 08/16/17 1:16 PM  Patient Name: Tracy [name]  Requesting Physician: No att. providers found       Reason for Consultation:   Ileitis   Nausea, vomiting  Diarrhea    Assessment and Plan:   Assessment:  1. Ileitis - first occurrence. Stool culture and O&P pending. 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 Consider infectious etiology. Other ddx would include inflammatory (ie Crohns)  2. Abdominal pain, nausea, vomiting, and diarrhea, likely 2/2 above  3. 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 Elevated LFTs - acute onset since admission. No known liver conditions, denies tylenol or herbal supplements. Consider 2/2 infection vs DILI. Should r/o acute hepatitis   5. Soft tissue mass behind umbilicus     Plan:  1. Follow-up on pending stool studies.  2. Check viral hepatitis panel, ANA, AMA, ASMA, LKM, ceruloplasmin, A1AT, iron profile, ferritin.  3. Continue to trend LFTs.  4. Continue antibiotics at this time.  5. Supportive care per primary team.    History:   Donalyn Torchio is a 52 y.o. female with a PMH of obesity s/p gastric sleeve later  Converted to duodenal switch who presents to the hospital on 08/14/2017 with abdominal pain, nausea, vomiting, and non-bloody diarrhea. She reports that the aching in her lower abdomen has been present for about 2 weeks, however 3 days ago her symptoms became worse. She has had associated nausea, vomiting, and diarrhea. She has never had symptoms like this in the past. CT on imaging revealed ileitis. She has had prior EGD before bariatric surgery, however no colonoscopy. No recent travel. Notes her fiance was in [place] and came back recently and has also had GI symptoms. No known FH of IBD.    Past Medical  History:     Past Medical History:   Diagnosis Date   . Carotid stenosis    . Disorder of thyroid    . Essential hypertension    . Fatty liver 2017   . GERD (gastroesophageal reflux disease)    . Hypercholesteremia    . Migraines    . Sleep apnea    . Vitamin D deficiency        Past Surgical History:     Past Surgical History:   Procedure Laterality Date   . CHOLECYSTECTOMY  12/2016   . LAPAROSCOPIC REVISION SLEEVE GASTRECTOMY TO GASTRIC BYPASS         Family History:     Family History   Problem Relation Age of Onset   . Cancer Mother    . Diabetes Mother    . Heart disease Mother    . Heart attack Mother    . Hypertension Mother    . Inflammatory bowel disease Mother    . Cancer Father    . Asthma Father    . Diabetes Father    . Hyperlipidemia Father    . Hypertension Father    . Asthma Sister    . Diabetes Sister    . Hyperlipidemia Sister    . Hypertension Sister    . Cancer Brother    . Hyperlipidemia Brother    . Hypertension Brother        Social History:  Social History     Social History   . Marital status: Significant Other     Spouse name: N/A   . Number of children: N/A   . Years of education: N/A     Social History Main Topics   . Smoking status: Never Smoker   . Smokeless tobacco: Never Used   . Alcohol use No   . Drug use: No   . Sexual activity: Not on file     Other Topics Concern   . Not on file     Social History Narrative   . No narrative on file       Allergies:     Allergies   Allergen Reactions   . Vicodin [Hydrocodone-Acetaminophen] Nausea And Vomiting       Medications:     Current Facility-Administered Medications   Medication Dose Route Frequency   . ciprofloxacin  500 mg Oral Q12H SCH   . enoxaparin  40 mg Subcutaneous Daily   . famotidine  20 mg Oral Q12H SCH    Or   . famotidine  20 mg Intravenous Q12H SCH   . levothyroxine  125 mcg Oral Daily at 0600   . levothyroxine  25 mcg Oral Once   . metroNIDAZOLE  500 mg Intravenous Q8H       Review of Systems:   General:  Patient denies  lack of appetite, night sweats, weight loss, fatigue, fever.   HEENT:  Patient denies headache, hoarseness   Cardiovascular:  Patient denies swelling of hands/feet, fainting/blacking out, chest pain.   Respiratory:  Patient denies chronic cough, difficulty breathing, wheezing.   Genitourinary:  Patient denies blood in urine, dark urine  Musculoskeletal: Patient denies joint pain, joint stiffness, joint swelling.   Skin:  Patient denies itching, rash.   Neurologic:  Patient denies dizziness, loss of consciousness, fainting, confusion  Heme/Lymphatic:  Patient denies easy bruising.       Pertinent positives noted in HPI.    Physical Exam:     Vitals:    08/16/17 1103   BP: (!) 168/100   Pulse: (!) 50   Resp:    Temp:    SpO2: 98%       General appearance: Obese, well nourished, appears stated age and in NAD  Eyes: Sclera anicteric, pink conjunctivae, no ptosis  ENMT: mucous membranes moist, nose and ears appear normal.  Oropharynx clear.  Chest: Non labored respirations, no audible wheezing, no clubbing or cyanosis  CV:  Regular rate and rhythm, no JVD, no LE edema  Abdomen: soft, +mild lower abdominal tenderness, non-distended, no masses or organomegaly  Skin: Normal color and turgor, no rashes, no suspicious skin lesions noted  Neuro: CN II-XII grossly intact.  No gross movement disorders noted.  Mental status: Appropriate affect, alert and oriented x 3    Labs Reviewed:     Recent Labs      08/15/17   0318  08/14/17   0839   WBC  3.83  3.56   Hgb  10.3*  11.1*   Hematocrit  31.8*  34.2*   Platelets  217  243   MCV  95.5  95.0       Recent Labs      08/16/17   0401  08/15/17   0318   Sodium  141  141   Potassium  3.5  3.3*   Chloride  110  110   CO2  24  25   BUN  5.0*  8.0  Creatinine  1.1*  0.9   Glucose  96  92   Calcium  8.8  8.5       Recent Labs      08/16/17   0401  08/15/17   1237   AST (SGOT)  171*  377*   ALT  206*  311*   Alkaline Phosphatase  148*  185*   Bilirubin, Total  1.0  0.8   Bilirubin, Direct   0.3  0.4   Protein, Total  5.4*  6.2   Albumin  3.3*  3.7       Recent Labs      08/15/17   1237  08/14/17   0839   PTT   --   27   PT  14.2  14.3   PT INR  1.1  1.1        Radiology:   Radiological Procedure reviewed:    CT A/P w IV and PO Con 08/14/17:  Mild thickening of the distal small bowel near the terminal  ileum with stranding in the adjacent mesentery. Findings are consistent  with ileitis. No bowel obstruction. No free fluid.  Normal appendix.  Small hiatal hernia.  Soft tissue mass behind the umbilicus most consistent with a benign cyst  or mass. Clinical correlation.

## 2017-08-17 ENCOUNTER — Encounter (INDEPENDENT_AMBULATORY_CARE_PROVIDER_SITE_OTHER): Payer: Self-pay

## 2017-08-17 LAB — ANA SCREEN ONLY: ANA Screen: NEGATIVE

## 2017-08-17 NOTE — Progress Notes (Signed)
[facility] ([facility]    Received a referral to schedule a follow up appointment with the [facility].  Left patient a voicemail and provided main clinic phone number.  Requested patient return call to schedule a follow up appointment as soon as possible.       Ingrid Benitez  Trans[facility] Services   Sched Reg Rep II  T [phone]  F [phone]

## 2017-08-17 NOTE — Progress Notes (Signed)
[facility] ([facility]    Received a referral to schedule a follow up appointment with the [facility].  Left patient a voicemail and provided main clinic phone number.  Requested patient return call to schedule a follow up appointment as soon as possible.       Ingrid Benitez  [facility]   Sched Reg Rep II  T [phone]  F [phone]

## 2017-08-22 LAB — MITOCHONDRIAL AND ACTIN ANTIBODY PANEL
Actin Antibody IgG: <20 U (ref <20)
Mitochondria M2 Ab (IgG): <=20 U (ref <=20.0)

## 2017-08-22 LAB — LIVER KIDNEY MICROSOME (LKM-1) AB: LKM-1 Antibody (IgG): <=20 U (ref <=20.0)

## 2017-08-24 LAB — STOOL OVA AND PARASITE SMEAR

## 2017-08-25 ENCOUNTER — Other Ambulatory Visit (FREE_STANDING_LABORATORY_FACILITY): Payer: Medicaid Other

## 2017-08-25 ENCOUNTER — Encounter (INDEPENDENT_AMBULATORY_CARE_PROVIDER_SITE_OTHER): Payer: Self-pay

## 2017-08-25 ENCOUNTER — Ambulatory Visit (INDEPENDENT_AMBULATORY_CARE_PROVIDER_SITE_OTHER): Payer: Medicaid Other | Admitting: Nurse Practitioner

## 2017-08-25 VITALS — BP 152/88 | HR 60 | Temp 98.0°F | Resp 16 | Wt 167.0 lb

## 2017-08-25 DIAGNOSIS — R7401 Elevation of levels of liver transaminase levels: Secondary | ICD-10-CM

## 2017-08-25 DIAGNOSIS — R740 Nonspecific elevation of levels of transaminase and lactic acid dehydrogenase [LDH]: Secondary | ICD-10-CM

## 2017-08-25 DIAGNOSIS — E039 Hypothyroidism, unspecified: Secondary | ICD-10-CM

## 2017-08-25 DIAGNOSIS — I10 Essential (primary) hypertension: Secondary | ICD-10-CM

## 2017-08-25 LAB — COMPREHENSIVE METABOLIC PANEL
ALT: 46 U/L (ref 0–55)
AST (SGOT): 54 U/L — ABNORMAL HIGH (ref 5–34)
Albumin/Globulin Ratio: 1.6 (ref 0.9–2.2)
Albumin: 3.9 g/dL (ref 3.5–5.0)
Alkaline Phosphatase: 109 U/L — ABNORMAL HIGH (ref 37–106)
BUN: 11 mg/dL (ref 7.0–19.0)
Bilirubin, Total: 0.4 mg/dL (ref 0.2–1.2)
CO2: 25 mEq/L (ref 21–29)
Calcium: 8.7 mg/dL (ref 8.5–10.5)
Chloride: 110 mEq/L (ref 100–111)
Creatinine: 0.9 mg/dL (ref 0.4–1.5)
Globulin: 2.5 g/dL (ref 2.0–3.7)
Glucose: 84 mg/dL (ref 70–100)
Potassium: 4.4 mEq/L (ref 3.5–5.1)
Protein, Total: 6.4 g/dL (ref 6.0–8.3)
Sodium: 142 mEq/L (ref 136–145)

## 2017-08-25 LAB — GFR: EGFR: >60

## 2017-08-25 LAB — HEMOLYSIS INDEX: Hemolysis Index: 40 — ABNORMAL HIGH (ref 0–18)

## 2017-08-25 LAB — TSH: TSH: 17.07 u[IU]/mL — ABNORMAL HIGH (ref 0.35–4.94)

## 2017-08-25 NOTE — Progress Notes (Signed)
Discussed the importance of establishing a medical home for ongoing care.  Provided patient with informational brochure for [facility] and advised patient to schedule appointment to enroll.

## 2017-08-25 NOTE — Patient Instructions (Signed)
Specialties:  Gastroenterology, Internal Medicine  Why:  for after discharge follow up   Contact information:  [address]  [place] [address]  [phone]

## 2017-08-26 NOTE — Progress Notes (Signed)
Chief Complaint   Patient presents with   . Abdominal Pain       History of Present Illness:     This patient is a 52 y.o. female here for f/u after admission for terminal ileitis, transaminitis and hypothyroidism.  Completed Cipro and Flagyl  -abd pain, f/c/n/v/d/c  Hx of gastric sleeve 12/18      Past Medical History:   Diagnosis Date   . Carotid stenosis    . Disorder of thyroid    . Essential hypertension    . Fatty liver 2017   . GERD (gastroesophageal reflux disease)    . Hypercholesteremia    . Migraines    . Sleep apnea    . Vitamin D deficiency        Past Surgical History:   Procedure Laterality Date   . CHOLECYSTECTOMY  12/2016   . LAPAROSCOPIC REVISION SLEEVE GASTRECTOMY TO GASTRIC BYPASS         Family History   Problem Relation Age of Onset   . Cancer Mother    . Diabetes Mother    . Heart disease Mother    . Heart attack Mother    . Hypertension Mother    . Inflammatory bowel disease Mother    . Cancer Father    . Asthma Father    . Diabetes Father    . Hyperlipidemia Father    . Hypertension Father    . Asthma Sister    . Diabetes Sister    . Hyperlipidemia Sister    . Hypertension Sister    . Cancer Brother    . Hyperlipidemia Brother    . Hypertension Brother        Social History     Social History   . Marital status: Significant Other     Spouse name: N/A   . Number of children: N/A   . Years of education: N/A     Occupational History   . Not on file.     Social History Main Topics   . Smoking status: Never Smoker   . Smokeless tobacco: Never Used   . Alcohol use No   . Drug use: No   . Sexual activity: Not on file     Other Topics Concern   . Not on file     Social History Narrative   . No narrative on file       Allergies   Allergen Reactions   . Vicodin [Hydrocodone-Acetaminophen] Nausea And Vomiting         Current Outpatient Prescriptions:   .  calcium-vitamin D (OSCAL-500 + D) 500-200 MG-UNIT per tablet, Take 1 tablet by mouth daily, Disp: , Rfl:   .  cetirizine (ZYRTEC) 10 MG tablet,  Take 1 tablet (10 mg total) by mouth daily, Disp: 30 tablet, Rfl: 0  .  Cyanocobalamin 5000 MCG SL Tab, by Nasal route once a week, Disp: , Rfl:   .  levothyroxine (SYNTHROID, LEVOTHROID) 125 MCG tablet, Take 1 tablet (125 mcg total) by mouth Once a day at 6:00am Brand Necessary, Disp: 30 tablet, Rfl: 0  .  Multiple Vitamins-Minerals (MULTIVITAMIN WITH MINERALS) tablet, Take 1 tablet by mouth daily, Disp: , Rfl:     Review of Systems   Constitutional: Negative for chills and fever.   Gastrointestinal: Negative for abdominal pain, constipation, diarrhea, nausea and vomiting.       All systems reviewed and negative except as described above.    Physical Exam:  Vitals:    08/25/17 1313   BP: 152/88   Pulse: 60   Resp: 16   Temp: 98 F (36.7 C)   SpO2: 100%     Wt Readings from Last 3 Encounters:   08/25/17 75.8 kg (167 lb)   08/14/17 76.2 kg (168 lb)   08/14/17 72.1 kg (159 lb)       General: awake, alert, oriented x 3  HEENT: perrla, eomi, sclera anicteric,oropharynx clear without lesions, mucous membranes moist  Neck: supple, no lymphadenopathy, no thyromegaly, no JVD, no carotid bruits  Cardiovascular: S1, S2, regular rate and rhythm, no murmurs, rubs, or gallops  Lungs: clear to auscultation bilaterally, without wheezing, rhonchi, or rales  Abdomen: soft, non tender, normal bowel sounds  Extremities: no clubbing, cyanosis, or edema  Skin: no rashes or lesions noted      Diagnostics:     Lab Results   Component Value Date    WBC 3.83 08/15/2017    HGB 10.3 (L) 08/15/2017    HCT 31.8 (L) 08/15/2017    PLT 217 08/15/2017    ALT 46 08/25/2017    AST 54 (H) 08/25/2017    NA 142 08/25/2017    K 4.4 08/25/2017    CL 110 08/25/2017    CREAT 0.9 08/25/2017    BUN 11.0 08/25/2017    CO2 25 08/25/2017    TSH 17.07 (H) 08/25/2017    INR 1.1 08/15/2017    GLU 84 08/25/2017    HGBA1C 4.5 (L) 08/15/2017       No results found.    Active Problems:     Patient Active Problem List   Diagnosis   . Ileitis   . Abdominal pain   .  Nausea & vomiting   . Diarrhea   . Decreased oral intake   . Migraine   . Uncontrolled hypertension   . Abdominal cyst   . Hiatal hernia   . Hypokalemia       Assessment/Plan:     52 y.o. female here for transaminitis, hypothyroidismand HTN  Check CMP to trend renal func and LFTs  Check TSH pt concerned about increase in Levothyroxine dose  Needs to schedule f/u with GI  F/u 3 weeks  Discussed HTN - pt denies having HTN  Over half time spent counseling and providing education regarding uncontrolled HTN including medication management and lifestyle changes necessary to help improve blood pressure.    Tracy [name] was seen today for abdominal pain.    Diagnoses and all orders for this visit:    Transaminitis  -     Comprehensive metabolic panel; Future    Hypothyroidism, unspecified type  -     TSH; Future    Hypertension, unspecified type        Follow up: 3 weeks   Discharged from [facility]?: no  Medical Home Referred to/Referred Back to: IM[facility]     Med Rec Complete: yes  Referred to [facility] for diabetes A1c > 8.5: na  Given commitment to care documents when appropriate: na   Teach back for HF, COPD, DM, AMI, PNA: na

## 2017-09-15 ENCOUNTER — Ambulatory Visit (INDEPENDENT_AMBULATORY_CARE_PROVIDER_SITE_OTHER): Payer: MEDICAID | Admitting: Nurse Practitioner

## 2017-10-13 ENCOUNTER — Ambulatory Visit (INDEPENDENT_AMBULATORY_CARE_PROVIDER_SITE_OTHER): Payer: 59 | Admitting: Internal Medicine

## 2017-10-13 ENCOUNTER — Encounter: Payer: Self-pay | Admitting: Internal Medicine

## 2017-10-13 VITALS — BP 161/95 | HR 58 | Temp 97.6°F | Ht 62.0 in | Wt 155.0 lb

## 2017-10-13 DIAGNOSIS — E890 Postprocedural hypothyroidism: Secondary | ICD-10-CM

## 2017-10-13 NOTE — Patient Instructions (Signed)
Continue Synthroid daily, I will let you once we get results.

## 2017-10-13 NOTE — Progress Notes (Signed)
Tracy [name] is a 52 y.o. -year-old female who presents today for new consult visit for  hypothyroidism.   PCP: Pcp, None, MD  Patient is referred by: Self    ##Hypothyroidism:    Pt had goiter, saw endocrinologist in 2012, had surgery in 2013, was benign, has been on Synthroid brand since, currently on d.  Was in hospital in 08/19 with ileitis and elevation of liver enzymes, had TSH of 65 then, dose increased from to d then, was discharged on d which the pt took for a month and then went back to taking d. Has been on d for 4 weeks now.  Had palpitations, heat intolerance when she was taking the d.    Had sleeve surgery in 2014, did not help as she started to gain weight and hence had gastric bypass in 12/18. Lost 75 lbs since 12/18.  Her last visit with endocrinologist was in 02/19, her TSH was 39 an dher dose was increased from to then.    Moved from [place] in 04/2017.      The patient has no history of cardiac disorder or atrial fibrillation or osteoporosis.      Additional relevant history: the patient denies prior heart problems and denies history of amiodarone.  Denies use of biotin supplements, and denies current use of antipsychotics.  Regarding medications that can lower TSH, the patient denies recent use of steroids.     With regards to symptoms suggesting hypothyroidism/thyroid hormone under replacement, the patient denies weight gain, denies fatigue, denies cold intolarance, denies decreased bowel movement frequency,   and denies depression.    With regards to symptoms suggesting thyroid hormone excess/thyroid hormone over replacement, the patient reports weight loss, denies fatigue, reports heat intolerance, denies palpitations, reports hyperdefecation, denies hand tremulousness and reports unexplained anxiety.    With regards to local neck symptoms, the patient has not had dysphagia and has not had dyspnea.    Past Medical  History:   Diagnosis Date   . Carotid stenosis    . Disorder of thyroid    . Essential hypertension    . Fatty liver 2017   . GERD (gastroesophageal reflux disease)    . Hypercholesteremia    . Migraines    . Sleep apnea    . Vitamin D deficiency       Past Surgical History:   Procedure Laterality Date   . CHOLECYSTECTOMY  12/2016   . LAPAROSCOPIC REVISION SLEEVE GASTRECTOMY TO GASTRIC BYPASS       Family History   Problem Relation Age of Onset   . Cancer Mother    . Diabetes Mother    . Heart disease Mother    . Heart attack Mother    . Hypertension Mother    . Inflammatory bowel disease Mother    . Cancer Father    . Asthma Father    . Diabetes Father    . Hyperlipidemia Father    . Hypertension Father    . Asthma Sister    . Diabetes Sister    . Hyperlipidemia Sister    . Hypertension Sister    . Cancer Brother    . Hyperlipidemia Brother    . Hypertension Brother      Social History     Socioeconomic History   . Marital status: Significant Other     Spouse name: None   . Number of children: None   . Years  of education: None   . Highest education level: None   Occupational History   . None   Social Needs   . Financial resource strain: None   . Food insecurity:     Worry: None     Inability: None   . Transportation needs:     Medical: None     Non-medical: None   Tobacco Use   . Smoking status: Never Smoker   . Smokeless tobacco: Never Used   Substance and Sexual Activity   . Alcohol use: No   . Drug use: No   . Sexual activity: None   Lifestyle   . Physical activity:     Days per week: None     Minutes per session: None   . Stress: None   Relationships   . Social connections:     Talks on phone: None     Gets together: None     Attends religious service: None     Active member of club or organization: None     Attends meetings of clubs or organizations: None     Relationship status: None   . Intimate partner violence:     Fear of current or ex partner: None     Emotionally abused: None     Physically abused: None      Forced sexual activity: None   Other Topics Concern   . None   Social History Narrative   . None     Allergies   Allergen Reactions   . Bee Pollen Swelling     Other reaction(s): Cough, Cough (ALLERGY/intolerance)  Stuffy nose  Stuffy nose  "Bee Stings".Marland KitchenMarland KitchenMarland KitchenStuffy nose; "throat swelling"     . Hydrocodone Hives, Other (See Comments) and Rash     "stomach upset"   . Vicodin [Hydrocodone-Acetaminophen] Nausea And Vomiting     No current facility-administered medications on file prior to visit.      Current Outpatient Medications on File Prior to Visit   Medication Sig Dispense Refill   . Biotin 5 MG/ML Liquid Take by mouth     . calcium-vitamin D (OSCAL-500 + D) 500-200 MG-UNIT per tablet Take 1 tablet by mouth daily     . cetirizine (ZYRTEC) 10 MG tablet Take 1 tablet (10 mg total) by mouth daily 30 tablet 0   . Cyanocobalamin 5000 MCG SL Tab by Nasal route once a week     . Multiple Vitamins-Minerals (MULTIVITAMIN WITH MINERALS) tablet Take 1 tablet by mouth daily     . [DISCONTINUED] levothyroxine (SYNTHROID, LEVOTHROID) 125 MCG tablet Take 1 tablet (125 mcg total) by mouth Once a day at 6:00am Brand Necessary (Patient taking differently: Take 100 mcg by mouth Once a day at 6:00am Brand Necessary  ) 30 tablet 0         Review of systems:  Constitutional: Negative for fever.   HEENT: Negative for congestion, sore throat and ear discharge.    Eyes: Negative for blurred vision, pain and discharge.    Respiratory: Negative for shortness of breath.    Cardiovascular: Negative for chest pain.  Gastrointestinal: Negative for vomiting and abdominal pain.    Genitourinary: Negative for dysuria.    Musculoskeletal: No joint pains  Skin: Negative for itching and rash.    Neurological: Negative for tingling and headaches.      Physical Exam:  Vitals:    10/13/17 1535   BP: (!) 161/95   BP Site: Right arm   Patient Position: Sitting  Pulse: (!) 58   Temp: 97.6 F (36.4 C)   TempSrc: Oral   SpO2: 98%   Weight: 70.3 kg (155  lb)   Height: 1.575 m (5\' 2" )      General appearance - alert, well appearing, and in no distress  Mental status - alert, oriented to person, place, and time, normal mood, behavior, speech, dress, motor activity, and thought processes  HEENT- normocephalic, atraumatic, anicteric sclerae, EOMI, mucous membranes moist  Neck - supple, no significant adenopathy, no thyromegaly  Chest - clear to auscultation, no wheezes, rales or rhonchi, symmetric air entry  Heart - normal rate, regular rhythm, normal S1, S2, no murmurs, rubs, clicks or gallops  Abdomen - soft, nontender, nondistended, no masses or organomegaly  Neurological - alert, oriented, normal speech, no focal findings or movement disorder noted  Musculoskeletal - no joint tenderness, deformity or swelling  Extremities - peripheral pulses normal, no pedal edema, no clubbing or cyanosis  Skin - normal color and turgor, no rashes, no suspicious skin lesions noted        Assessment:    Leala Bryand is a 52 y.o. -year-old female who presents today for new visit for post surgical hypothyroidism.       Plan:   ##Hypothyroidism:  ON Brand synthroid d.  Recheck TFTs (had symptoms of hyperthyroidism when she was on and hence she reduced dose to ).  Discussed the appropriate way to take levothyroxine  Asked patient to call if has sx of under or over replacement.     Follow up in 3 months. Sooner if any concerns.      Orders Placed This Encounter   Procedures   . TSH   . T4, free       Patient Instructions   Continue Synthroid daily, I will let you once we get results.        Renaee Munda, MD, [facility]  [address]  [place], [place] [address]  Tel: [phone]  Fax: [phone]

## 2017-10-14 LAB — T4, FREE: T4, Free: 0.82 ng/dL (ref 0.82–1.77)

## 2017-10-14 LAB — TSH: TSH: 11.26 u[IU]/mL — ABNORMAL HIGH (ref 0.450–4.500)

## 2017-10-15 ENCOUNTER — Encounter: Payer: Self-pay | Admitting: Internal Medicine

## 2017-10-15 ENCOUNTER — Telehealth: Payer: Self-pay | Admitting: Internal Medicine

## 2017-10-15 ENCOUNTER — Emergency Department
Admission: EM | Admit: 2017-10-15 | Discharge: 2017-10-15 | Disposition: A | Discharge status: Home / Self Care | Payer: 59 | Attending: General Practice | Admitting: General Practice

## 2017-10-15 DIAGNOSIS — R059 Cough, unspecified: Secondary | ICD-10-CM

## 2017-10-15 DIAGNOSIS — R519 Headache, unspecified: Secondary | ICD-10-CM

## 2017-10-15 DIAGNOSIS — I10 Essential (primary) hypertension: Secondary | ICD-10-CM | POA: Insufficient documentation

## 2017-10-15 DIAGNOSIS — R0981 Nasal congestion: Secondary | ICD-10-CM

## 2017-10-15 DIAGNOSIS — E079 Disorder of thyroid, unspecified: Secondary | ICD-10-CM | POA: Insufficient documentation

## 2017-10-15 DIAGNOSIS — R51 Headache: Secondary | ICD-10-CM | POA: Insufficient documentation

## 2017-10-15 DIAGNOSIS — J069 Acute upper respiratory infection, unspecified: Secondary | ICD-10-CM

## 2017-10-15 DIAGNOSIS — E7800 Pure hypercholesterolemia, unspecified: Secondary | ICD-10-CM | POA: Insufficient documentation

## 2017-10-15 LAB — ECG 12-LEAD
Atrial Rate: 47 {beats}/min
P Axis: 39 degrees
P-R Interval: 174 ms
Q-T Interval: 434 ms
QRS Duration: 90 ms
QTC Calculation (Bezet): 384 ms
R Axis: -19 degrees
T Axis: 37 degrees
Ventricular Rate: 47 {beats}/min

## 2017-10-15 MED ORDER — ALBUTEROL SULFATE (2.5 MG/3ML) 0.083% IN NEBU
2.50 mg | INHALATION_SOLUTION | Freq: Once | RESPIRATORY_TRACT | Status: AC
Start: 2017-10-15 — End: 2017-10-15
  Administered 2017-10-15: 09:00:00 2.5 mg via RESPIRATORY_TRACT
  Filled 2017-10-15: qty 3

## 2017-10-15 MED ORDER — BUTALBITAL-APAP-CAFFEINE 50-325-40 MG PO TABS
1.0000 | ORAL_TABLET | ORAL | 0 refills | Status: DC | PRN
Start: 2017-10-15 — End: 2020-03-23

## 2017-10-15 MED ORDER — GUAIFENESIN 100 MG/5ML PO SOLN
200.00 mg | Freq: Once | ORAL | Status: AC
Start: 2017-10-15 — End: 2017-10-15
  Administered 2017-10-15: 09:00:00 200 mg via ORAL
  Filled 2017-10-15: qty 10

## 2017-10-15 MED ORDER — IBUPROFEN 600 MG PO TABS
600.00 mg | ORAL_TABLET | Freq: Once | ORAL | Status: AC
Start: 2017-10-15 — End: 2017-10-15
  Administered 2017-10-15: 09:00:00 600 mg via ORAL
  Filled 2017-10-15: qty 1

## 2017-10-15 MED ORDER — BUTALBITAL-APAP-CAFFEINE 50-325-40 MG PO TABS
1.00 | ORAL_TABLET | Freq: Once | ORAL | Status: AC
Start: 2017-10-15 — End: 2017-10-15
  Administered 2017-10-15: 1 via ORAL
  Filled 2017-10-15: qty 1

## 2017-10-15 MED ORDER — OXYMETAZOLINE HCL 0.05 % NA SOLN
2.00 | Freq: Two times a day (BID) | NASAL | 0 refills | Status: DC
Start: 2017-10-15 — End: 2017-11-05

## 2017-10-15 MED ORDER — SYNTHROID 112 MCG PO TABS
112.00 ug | ORAL_TABLET | Freq: Every day | ORAL | 1 refills | Status: DC
Start: 2017-10-15 — End: 2018-03-05

## 2017-10-15 MED ORDER — DEXTROMETHORPHAN-GUAIFENESIN ER 30-600 MG PO TB12
1.00 | ORAL_TABLET | Freq: Two times a day (BID) | ORAL | 0 refills | Status: AC
Start: 2017-10-15 — End: 2017-10-25

## 2017-10-15 NOTE — Telephone Encounter (Signed)
-----   Message from Randa Ngo II sent at 10/13/2017  3:49 PM EDT -----  Regarding: Retreive records  New patient being seen for Thyroid issues. Last doctors name Marlowe Kays and phone is [phone].

## 2017-10-15 NOTE — ED Triage Notes (Signed)
Pt stated having right ear pain, "feeling like there is water in it" pt denies pain in left ear. C/o sore throat and sinus congestion x7 days. Pt stated having chills has not taken temperature. Also c/o non productive cough with chest tightness since yesterday. Pt alert and oriented x4 unlabored breathing

## 2017-10-15 NOTE — Telephone Encounter (Signed)
We have received previous doctors notes from [facility].

## 2017-10-15 NOTE — Discharge Instructions (Signed)
Upper Respiratory Infection     You have been diagnosed with a viral upper respiratory infection, often called a "URI."     The symptoms of a viral upper respiratory infection may include fever (temperature higher than 100.4ºF / 38ºC), runny nose, congestion and sinus fullness, facial pain, earache, sore throat, cough, and occasionally wheezing. The symptoms usually improve within 3 to 4 days. It might take up to 10 days before you feel completely better.     URI’s are treated with fluids, rest, and medication for fever and pain. Sometimes decongestants, cough medications or-medications for wheezing can also help.     Antibiotics have NO effect whatsoever on viruses and ARE NOT needed for a URI. Taking antibiotics when they are not necessary can cause side effects (like diarrhea or allergic reactions). It can also cause resistance, meaning antibiotics won’t work when you need them in the future.     YOU SHOULD SEEK MEDICAL ATTENTION IMMEDIATELY, EITHER HERE OR AT THE NEAREST EMERGENCY DEPARTMENT, IF ANY OF THE FOLLOWING OCCURS:  · You have new or worse symptoms or concerns.  · You are short of breath.  · You have a severe headache, stiff neck, confusion, or problems thinking.  · You have nasal discharge, fever (temperature higher than 100.4ºF / 38ºC), or productive cough (one that brings up mucous from the lungs) lasting more that 10 days. Occasionally a viral cold may lead into a bacterial infection, such as a sinus infection, ear infection or pneumonia. Taking antibiotics during the cold will NOT prevent these infections, but if a secondary infection occurs, you might require additional treatment.     If you can't follow up with your doctor, or if at any time you feel you need to be rechecked or seen again, come back here or go to the nearest emergency department.

## 2017-10-15 NOTE — ED Provider Notes (Signed)
EMERGENCY DEPARTMENT NOTE    Physician/Midlevel provider first contact with patient: 10/15/17 0821         HISTORY OF PRESENT ILLNESS   Historian:Patient  Translator Used: No    Chief Complaint: Congestion and Headache     Mechanism of Injury:       52 y.o. female presents to ED with headache, nasal congestion ear fullness, sore throat, chest tightness and cough.  Patient states symptoms started 3-4 days ago and have been progressive.  Reports a non-productive cough associated with chest tightness and SOB.  A sore throat with coughing, nasal congestion and a right sided headache and ear fullness associated with photophobia.  Denies fever, vision changes, motor or sensory deficits,  N/V, abdominal pain.  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       1. Location of symptoms: right side of head  2. Onset of symptoms: 3-4 days ago  3. What was patient doing when symptoms started (Context): see above  4. Severity: 10/10  5. Timing: constant  6. Activities that worsen symptoms: none  7. Activities that improve symptoms: none  8. Quality: pressure  9. Radiation of symptoms:yes to right ear  10. Associated signs and Symptoms: see above  11. Are symptoms worsening? yes  MEDICAL HISTORY     Past Medical History:  Past Medical History:   Diagnosis Date   . Carotid stenosis    . Disorder of thyroid    . Essential hypertension    . Fatty liver 2017   . GERD (gastroesophageal reflux disease)    . Hypercholesteremia    . Migraines    . Sleep apnea    . Vitamin D deficiency        Past Surgical History:  Past Surgical History:   Procedure Laterality Date   . CHOLECYSTECTOMY  12/2016   . LAPAROSCOPIC REVISION SLEEVE GASTRECTOMY TO GASTRIC BYPASS         Social History:  Social History     Socioeconomic History   . Marital status: Significant Other     Spouse name: Not on file   . Number of children: Not on file   . Years of education: Not on file   . Highest education level: Not on file   Occupational History   .  Not on file   Social Needs   . Financial resource strain: Not on file   . Food insecurity:     Worry: Not on file     Inability: Not on file   . Transportation needs:     Medical: Not on file     Non-medical: Not on file   Tobacco Use   . Smoking status: Never Smoker   . Smokeless tobacco: Never Used   Substance and Sexual Activity   . Alcohol use: No   . Drug use: No   . Sexual activity: Not on file   Lifestyle   . Physical activity:     Days per week: Not on file     Minutes per session: Not on file   . Stress: Not on file   Relationships   . Social connections:     Talks on phone: Not on file     Gets together: Not on file     Attends religious service: Not on file     Active member of club or organization: Not on file     Attends meetings of clubs or organizations: Not on file     Relationship  status: Not on file   . Intimate partner violence:     Fear of current or ex partner: Not on file     Emotionally abused: Not on file     Physically abused: Not on file     Forced sexual activity: Not on file   Other Topics Concern   . Not on file   Social History Narrative   . Not on file       Family History:  Family History   Problem Relation Age of Onset   . Cancer Mother    . Diabetes Mother    . Heart disease Mother    . Heart attack Mother    . Hypertension Mother    . Inflammatory bowel disease Mother    . Cancer Father    . Asthma Father    . Diabetes Father    . Hyperlipidemia Father    . Hypertension Father    . Asthma Sister    . Diabetes Sister    . Hyperlipidemia Sister    . Hypertension Sister    . Cancer Brother    . Hyperlipidemia Brother    . Hypertension Brother        Outpatient Medication:  Previous Medications    BIOTIN 5 MG/ML LIQUID    Take by mouth    CALCIUM-VITAMIN D (OSCAL-500 + D) 500-200 MG-UNIT PER TABLET    Take 1 tablet by mouth daily    CETIRIZINE (ZYRTEC) 10 MG TABLET    Take 1 tablet (10 mg total) by mouth daily    CYANOCOBALAMIN 5000 MCG SL TAB    by Nasal route once a week    MULTIPLE  VITAMINS-MINERALS (MULTIVITAMIN WITH MINERALS) TABLET    Take 1 tablet by mouth daily         REVIEW OF SYSTEMS   Review of Systems   Constitutional: Positive for chills.   HENT: Positive for congestion, ear pain and sore throat.    Eyes: Positive for photophobia.   Respiratory: Positive for cough.    Cardiovascular:        Chest tightness   Gastrointestinal: Positive for diarrhea.   Neurological: Positive for headaches.   All other systems reviewed and are negative.           PHYSICAL EXAM     ED Triage Vitals   Enc Vitals Group      BP       Pulse       Resp       Temp       Temp src       SpO2       Weight       Height       Head Circumference       Peak Flow       Pain Score       Pain Loc       Pain Edu?       Excl. in GC?    Physical Exam  Vitals signs and nursing note reviewed.   Constitutional:       General: She is not in acute distress.     Appearance: Normal appearance. She is normal weight. She is not ill-appearing, toxic-appearing or diaphoretic.   HENT:      Head: Normocephalic and atraumatic.      Right Ear: Tympanic membrane, ear canal and external ear normal.      Left Ear: Tympanic membrane, ear canal and external ear  normal.      Nose: Nose normal. No congestion or rhinorrhea.      Mouth/Throat:      Mouth: Mucous membranes are moist.      Pharynx: Oropharynx is clear. No oropharyngeal exudate or posterior oropharyngeal erythema.   Eyes:      Extraocular Movements: Extraocular movements intact.      Conjunctiva/sclera: Conjunctivae normal.      Pupils: Pupils are equal, round, and reactive to light.   Neck:      Musculoskeletal: Normal range of motion and neck supple.   Cardiovascular:      Rate and Rhythm: Normal rate and regular rhythm.      Pulses: Normal pulses.      Heart sounds: Normal heart sounds.   Pulmonary:      Effort: Pulmonary effort is normal.      Breath sounds: Normal breath sounds.   Musculoskeletal: Normal range of motion.   Skin:     General: Skin is warm.      Capillary  Refill: Capillary refill takes less than 2 seconds.      Findings: No rash.   Neurological:      General: No focal deficit present.      Mental Status: She is alert.   Psychiatric:         Mood and Affect: Mood normal.             MEDICAL DECISION MAKING     DISCUSSION    Well appearing, NAD and afebrile in ED.  Non-focal physical exam.  Suspect viral URI.  Doubt pneumonia, meningitis, AOM, intracranial abnormality.      EKG non-ischemic    Patient treated in ED with Motrin, guaifenesin, and a breathing treatment.  On re exam patient states she is feeling better and ready for discharge.  Counseled on precautions to prevent spread, disease course and treatment and all questions were answered.  Return precautions given.        Vital Signs: Reviewed the patient?s vital signs.   Nursing Notes: Reviewed and utilized available nursing notes.  Medical Records Reviewed: Reviewed available past medical records.  Counseling: The emergency provider has spoken with the patient and discussed today?s findings, in addition to providing specific details for the plan of care.  Questions are answered and there is agreement with the plan.      MIPS DOCUMENTATION        CARDIAC STUDIES    The following cardiac studies were independently interpreted by the Emergency Medicine Physician.  For full cardiac study results please see chart.    Monitor Strip  Interpreted by ED Physician  Rate: 50  Rhythm: sinus bradycardia   ST Changes: none    EKG Interpretation:  Signed and interpreted byED Physician, Johnsie Cancel   Time Interpreted: 0825  Comparison: yes, no significant change  Rate: 47  Rhythm: sinus bradycardia   Axis: normal  Intervals: normal  Blocks: none  ST segments: no elevation or depression  Interpretation: Nonspecific  EKG    EMERGENCY IMAGING STUDIES    The following imagine studies were independently interpreted by me (emergency physician):      RADIOLOGY IMAGING STUDIES      No orders to display           PULSE OXIMETRY    Oxygen  Saturation by Pulse Oximetry: 100%  Interventions: none  Interpretation:  Normal     EMERGENCY DEPT. MEDICATIONS      ED Medication Orders (From admission, onward)  Start Ordered     Status Ordering Provider    10/15/17 0957 10/15/17 0956  butalbital-acetaminophen-caffeine (FIORICET, ESGIC) 50-325-40 MG per tablet 1 tablet  Once     Route: Oral  Ordered Dose: 1 tablet     Last MAR action:  Given Jonerik Sliker L    10/15/17 0840 10/15/17 0839  albuterol (PROVENTIL) (2.5 MG/3ML) 0.083% nebulizer solution 2.5 mg  RT - Once     Route: Nebulization  Ordered Dose: 2.5 mg     Last MAR action:  Given Izza Bickle L    10/15/17 0840 10/15/17 0839  guaiFENesin (ROBITUSSIN) oral solution 200 mg  Once     Route: Oral  Ordered Dose: 200 mg     Last MAR action:  Given Zakir Henner L    10/15/17 0840 10/15/17 0839  ibuprofen (ADVIL,MOTRIN) tablet 600 mg  Once     Route: Oral  Ordered Dose: 600 mg     Last MAR action:  Given Elizah Lydon L          LABORATORY RESULTS    Ordered and independently interpreted AVAILABLE laboratory tests. Please see results section in chart for full details.  Results for orders placed or performed during the hospital encounter of 10/15/17   ECG 12 Lead   Result Value Ref Range    Ventricular Rate 47 BPM    Atrial Rate 47 BPM    P-R Interval 174 ms    QRS Duration 90 ms    Q-T Interval 434 ms    QTC Calculation (Bezet) 384 ms    P Axis 39 degrees    R Axis -19 degrees    T Axis 37 degrees       CRITICAL CARE/PROCEDURES    Procedures      DIAGNOSIS      Diagnosis:  Final diagnoses:   Upper respiratory tract infection, unspecified type   Cough   Right-sided headache   Nasal congestion       Disposition:  ED Disposition     ED Disposition Condition Date/Time Comment    Discharge  Thu Oct 15, 2017 10:02 AM [name] discharge to home/self care.    Condition at disposition: Stable          Prescriptions:  Patient's Medications   New Prescriptions    BUTALBITAL-ACETAMINOPHEN-CAFFEINE  (FIORICET, ESGIC) 50-325-40 MG PER TABLET    Take 1 tablet by mouth every 4 (four) hours as needed for Headaches    DEXTROMETHORPHAN-GUAIFENESIN (MUCINEX DM) 30-600 MG PER 12 HR TABLET    Take 1 tablet by mouth every 12 (twelve) hours for 10 days    OXYMETAZOLINE (AFRIN) 0.05 % NASAL SPRAY    2 sprays by Nasal route 2 (two) times daily    SYNTHROID 112 MCG TABLET    Take 1 tablet (112 mcg total) by mouth Once a day at 6:00am   Previous Medications    BIOTIN 5 MG/ML LIQUID    Take by mouth    CALCIUM-VITAMIN D (OSCAL-500 + D) 500-200 MG-UNIT PER TABLET    Take 1 tablet by mouth daily    CETIRIZINE (ZYRTEC) 10 MG TABLET    Take 1 tablet (10 mg total) by mouth daily    CYANOCOBALAMIN 5000 MCG SL TAB    by Nasal route once a week    MULTIPLE VITAMINS-MINERALS (MULTIVITAMIN WITH MINERALS) TABLET    Take 1 tablet by mouth daily   Modified Medications    No medications on file  Discontinued Medications    No medications on file        Alfonso Patten, DO  10/15/17 1008

## 2017-10-17 LAB — ECG 12-LEAD
Atrial Rate: 47 {beats}/min
P Axis: 39 degrees
P-R Interval: 174 ms
Q-T Interval: 434 ms
QRS Duration: 90 ms
QTC Calculation (Bezet): 384 ms
R Axis: -19 degrees
T Axis: 37 degrees
Ventricular Rate: 47 {beats}/min

## 2017-10-21 NOTE — Progress Notes (Addendum)
Received fax from [facility] stating that pt's insurance does not cover Synthroid 0.112, and suggested that pt can be on genetic.   Nurse called pt to inform her about the changes. Pt refused and wants to stay on Synthroid and will pay out of pocket.    In addition, nurse informed about pt's decision to stay with Synthroid

## 2017-10-22 ENCOUNTER — Ambulatory Visit: Payer: MEDICAID | Admitting: Endocrinology, Diabetes and Metabolism

## 2017-10-22 ENCOUNTER — Encounter: Payer: Self-pay | Admitting: Endocrinology, Diabetes and Metabolism

## 2017-11-05 ENCOUNTER — Ambulatory Visit (INDEPENDENT_AMBULATORY_CARE_PROVIDER_SITE_OTHER): Payer: 59 | Admitting: Nurse Practitioner

## 2017-11-05 ENCOUNTER — Ambulatory Visit (INDEPENDENT_AMBULATORY_CARE_PROVIDER_SITE_OTHER): Payer: 59

## 2017-11-05 ENCOUNTER — Encounter (INDEPENDENT_AMBULATORY_CARE_PROVIDER_SITE_OTHER): Payer: Self-pay | Admitting: Nurse Practitioner

## 2017-11-05 VITALS — BP 154/89 | HR 54 | Temp 98.2°F | Resp 16 | Ht 62.6 in | Wt 152.6 lb

## 2017-11-05 DIAGNOSIS — Z9884 Bariatric surgery status: Secondary | ICD-10-CM

## 2017-11-05 DIAGNOSIS — I10 Essential (primary) hypertension: Secondary | ICD-10-CM

## 2017-11-05 DIAGNOSIS — K529 Noninfective gastroenteritis and colitis, unspecified: Secondary | ICD-10-CM

## 2017-11-05 DIAGNOSIS — Z789 Other specified health status: Secondary | ICD-10-CM

## 2017-11-05 DIAGNOSIS — R194 Change in bowel habit: Secondary | ICD-10-CM

## 2017-11-05 DIAGNOSIS — J019 Acute sinusitis, unspecified: Secondary | ICD-10-CM

## 2017-11-05 MED ORDER — DOXYCYCLINE HYCLATE 100 MG PO TABS
100.00 mg | ORAL_TABLET | Freq: Two times a day (BID) | ORAL | 0 refills | Status: AC
Start: 2017-11-05 — End: 2017-11-12

## 2017-11-05 NOTE — Progress Notes (Signed)
[facility] [place] - Social Services    11/05/17 - SW met with Tracy [name] and provided pt with in-network providers for Gasteroenterology. SW provided outreach to [facility] @ [phone]. SW verified physician has previous experience with gastric band. SW noted pt had further questions, pt advised to address with referring Specialist. SW will continue to monitor case per protocol.    Farrel Demark, MSW  Social Worker Case Manager 1  Supervisee in Social Work  [employer]  Phone: [phone]  Fax: [phone]

## 2017-11-05 NOTE — Progress Notes (Signed)
No chief complaint on file.       HPI:     This is a 52 y.o. Tracy [name] here to establish care.     Last Epic note from recent ED visit 10/15/17 for URI. Tx w/ breathing treatment, Motrin and guaifenesin and sent home. Per ECG "SINUS BRADYCARDIA POSSIBLE LEFT ATRIAL ENLARGEMENT BORDERLINE NORMAL/ABNORMAL ELECTROCARDIOGRAM WHEN COMPARED WITH ECG OF 23-Jul-2017 10:21, NONSPECIFIC T WAVE ABNORMALITY NO LONGER EVIDENT IN LATERAL LEADS Confirmed by [name], MD, KINDA [phone]) on 10/17/2017"    #Sinus pressure and chills  -Started 1 week ago  -Reports congestion, sinus tenderness and chills     #Hypothyroidism  -Synthroid or   -Supposed to be163mcg daily (per hospital d/c in 08/2017 supposed to be 125); patient states she does not agree with dosing     #Terminal ileitis  -Continues to have GI issues (loud stomach noises, lower abdominal cramping and frequent BM Up to 7x a day) as of 11/05/17  -Hospitalized 8/9-8/11/19 for terminal ileitis and fatty liver. AST/ALT elevated at 515-306 but trended down inpatient. GI consulted. D/c home on PO Cipro/Flagyl. Will need to avoid alcohol and acetaminophen.   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 MCV 95.5, MCH 30.9, MCHC 32.4.  The iron profile and ferritin level were within normal limits.  The patient has a history of a gastric bypass which is likely the cause of her anemia. There is no signs of acute bleeding."    #Migraines  -Worse w/ frequent BMs  -Sometimes nausea but denies aura  -Fioricet PRN    #HTN  -Patient denies and states that she does not need medication  -Confirms it is typically higher at medical appointments  -Home SBP 130s and DBP 80s    #HM  -BMI 27  -Depression screening: PHQ2 score 0 today   -Never smoker  -Pap smear w/ HPV negative for  both 09/2012  -Mammogram negative 11/2015  -Lipid panel (08/2016) under [employer] in Care[facility]where: total 187, LDL 114, HDL 64 and triglycerides 45  -HgbA1C 4.5% 08/15/2017    Past Medical History:   Diagnosis Date   . Carotid stenosis    . Disorder of thyroid    . Essential hypertension    . Fatty liver 2017   . GERD (gastroesophageal reflux disease)    . Hypercholesteremia    . Migraines    . Sleep apnea    . Vitamin D deficiency      Past Surgical History:   Procedure Laterality Date   . CHOLECYSTECTOMY  12/2016   . LAPAROSCOPIC REVISION SLEEVE GASTRECTOMY TO GASTRIC BYPASS       Family History   Problem Relation Age of Onset   . Cancer Mother    . Diabetes Mother    . Heart disease Mother    . Heart attack Mother    . Hypertension Mother    . Inflammatory bowel disease Mother    . Cancer Father    . Asthma Father    . Diabetes Father    . Hyperlipidemia Father    . Hypertension Father    . Asthma Sister    . Diabetes Sister    . Hyperlipidemia Sister    . Hypertension Sister    . Cancer Brother    .  Hyperlipidemia Brother    . Hypertension Brother      Social History     Tobacco Use   . Smoking status: Never Smoker   . Smokeless tobacco: Never Used   Substance Use Topics   . Alcohol use: No   . Drug use: No         ALLERGIES:     Allergies   Allergen Reactions   . Bee Pollen Swelling     Other reaction(s): Cough, Cough (ALLERGY/intolerance)  Stuffy nose  Stuffy nose  "Bee Stings".Marland KitchenMarland KitchenMarland KitchenStuffy nose; "throat swelling"     . Hydrocodone Hives, Rash and Other (See Comments)     "stomach upset" - patient states she is not allergic   . Vicodin [Hydrocodone-Acetaminophen] Nausea And Vomiting       MEDICATIONS:     Current Outpatient Medications:   .  Biotin 5 MG/ML Liquid, Take by mouth, Disp: , Rfl:   .  butalbital-acetaminophen-caffeine (FIORICET, ESGIC) 50-325-40 MG per tablet, Take 1 tablet by mouth every 4 (four) hours as needed for Headaches, Disp: 10 tablet, Rfl: 0  .  calcium-vitamin D (OSCAL-500 + D) 500-200  MG-UNIT per tablet, Take 1 tablet by mouth daily, Disp: , Rfl:   .  Cyanocobalamin 5000 MCG SL Tab, by Nasal route once a week, Disp: , Rfl:   .  Multiple Vitamins-Minerals (MULTIVITAMIN WITH MINERALS) tablet, Take 1 tablet by mouth daily, Disp: , Rfl:   .  SYNTHROID 112 MCG tablet, Take 1 tablet (112 mcg total) by mouth Once a day at 6:00am, Disp: 90 tablet, Rfl: 1  .  doxycycline (VIBRA-TABS) 100 MG tablet, Take 1 tablet (100 mg total) by mouth 2 (two) times daily for 7 days, Disp: 14 tablet, Rfl: 0    ROS:   Review of Systems   Constitutional: Negative for chills, fever and weight loss.        Since patient was anemic she gets cold easily   Gastrointestinal: Negative for blood in stool, constipation and diarrhea.        Frequent BM and cramping daily basis.        All other systems reviewed and negative except as described above    PHYSICAL EXAM:   BP 154/89 (BP Site: Right arm, Patient Position: Sitting, Cuff Size: Medium)   Pulse (!) 54   Temp 98.2 F (36.8 C) (Oral)   Resp 16   Ht 1.59 m (5' 2.6")   Wt 69.2 kg (152 lb 9.6 oz)   SpO2 99%   BMI 27.38 kg/m     Manual 144/Tracy    Physical Exam    General: awake, alert, oriented x 3  HEENT: tender sinuses to exam and bogginess of right nasal turbinate, shotty submandibular/tonsilar lymphadenopathy on right side, ulvula midlines and tonsils free of exudate and erythema, perrla, eomi, sclera anicteric, oropharynx clear without lesions, mucous membranes moist  Neck: supple, no lymphadenopathy, no thyromegaly, no JVD, no carotid bruits  Cardiovascular: S1, S2, regular rate and rhythm, no murmurs, rubs, or gallops  Lungs: clear to auscultation bilaterally, without wheezing, rhonchi, or rales  Abdomen: soft, non tender, normal bowel sounds  Extremities: no clubbing, cyanosis, or edema  Skin: no rashes or lesions noted    DIAGNOSTICS:     Lab Results   Component Value Date    WBC 3.83 08/15/2017    HGB 10.3 (L) 08/15/2017    HCT 31.8 (L) 08/15/2017    PLT 217  08/15/2017    ALT 46 08/25/2017  AST 54 (H) 08/25/2017    NA 142 08/25/2017    K 4.4 08/25/2017    CL 110 08/25/2017    CREAT 0.9 08/25/2017    BUN 11.0 08/25/2017    CO2 25 08/25/2017    TSH 11.260 (H) 10/13/2017    INR 1.1 08/15/2017    GLU 84 08/25/2017    HGBA1C 4.5 (L) 08/15/2017       No results found.    ASSESSMENT and PLAN:     52 y.o. Tracy [name] here for one time visit because she would like to establish w/ PCP closer to home but needs a referral to GI in the meantime and feels she has a sinus infection    Diagnoses and all orders for this visit:    Uncontrolled hypertension    S/P gastric bypass  -     Ambulatory referral to Gastroenterology    Frequent bowel movements  -     Ambulatory referral to Gastroenterology    Ileitis  -     Ambulatory referral to Gastroenterology    Acute rhinosinusitis  -     doxycycline (VIBRA-TABS) 100 MG tablet; Take 1 tablet (100 mg total) by mouth 2 (two) times daily for 7 days        #Uncomplicated acute sinusitis  -Afebrile, nontoxic  -Typically we would monitor your sinus symptoms for another week till prescribing antibiotics (consistent w/ Up-To-Date guidelines) However since you will not be following up with the patient and she will be establishing care w/ another PCP and not following up with [place] I will prescribe an antibiotic  -Doxy 100mg  BID x7 days  -ER precautions reviewed  -Patient instructed to resume second generation antihistamine (loratadine) to prevent congestion and subsequent sinus infections    #Hypothyroidism  -Synthroid    *Patinet states she does not feel she should be on   *Declines further evaluation/discuss at this time  -TSH will need to be rechecked with new PCP next week; patient notified    #Terminal ileitis/transaminitis   -LFTs improved  -Denies red flag symptoms  -Continues to have GI issues (loud stomach noises, lower abdominal cramping and frequent BM Up to 7x a day) as of 11/05/17  -Will refer to GI and request MD with experience  in gastric bypass    #Normocytic anemia  -To f/u w/ new PCP  -Denies symptoms of active bleed    #Migraines  -Managed with Fioricet PRN    #HTN  -Patient encouraged to continue monitoring blood pressure and d/w new PCP    #HM  -Due for pap smear and mammogram  -Routine screening to be completed by new PCP (patient to Dr. Dr. Sheria Lang next week) per patient request      Follow-up with new PCP next week      Lendon Colonel NP      *PATIENT INFORMED THAT MEDICAID WILL TYPICALLY ONLY REIMBURSE FOR ONE PCP AND SHE WILL MOST LIKELY NOT BE ABLE TO GO BACK AND FORTH BETWEEN [place] AND OTHER PCP PROVIDER.

## 2017-11-05 NOTE — Patient Instructions (Addendum)
1. Please see a Gastroenterologist    2. Typically we would monitor your sinus symptoms for another week till prescribing antibiotics. However since you will not be following up with [place] I will prescribe you an antibiotic for 7 days.     3. Start taking loratadine     4. Follow-up with PCP next week      Acute Bacterial Rhinosinusitis (ABRS)    Acute bacterial rhinosinusitis (ABRS) is an infection of your nasal cavity and sinuses. It's caused by bacteria. Acute means that you've had symptoms for less than 4 weeks, but possibly up to 12 weeks.  Understanding your sinuses  The nasal cavity is the large air-filled space behind your nose. The sinuses are a group of spaces formed by the bones of your face. They connect with your nasal cavity. ABRS causes the tissue lining these spaces to become inflamed. Mucus may not drain normally. This leads to facial pain and other symptoms.  What causes ABRS?  ABRS most often follows an upper respiratory infection caused by a virus. Bacteria then infect the lining of your nasal cavity and sinuses. But you can also get ABRS if you have:   Nasal allergies   Long-term nasal swelling and congestion not caused by allergies   Blockage in the nose  Symptoms of ABRS  The symptoms of ABRS may be different for each person and include:   Nasal congestion or blockage   Pain or pressure in the face   Thick, colored drainage from the nose  Other symptoms may include:   Runny nose   Fluid draining from the nose down the throat (postnasal drip)   Headache   Cough   Pain   Fever  Diagnosing ABRS  ABRS may be diagnosed if you've had an upper respiratory infection like a cold and cough for 10 or more days without improvement or with worsening symptoms. Your healthcare provider will ask about your symptoms and your medical history. The provider will check your vital signs, including your temperature. You'll have a physical exam. The healthcare provider will check your ears, nose, and throat.  You likely won't need any tests. If ABRS comes back, you may have a culture or other tests.  Treatment for ABRS  Treatment may include:   Antibiotic medicine. This is for symptoms that last for at least 10 to 14 days.   Nasal corticosteroid medicine. Drops or spray used in the nose can lessen swelling and congestion.   Over-the-counter pain medicine. This is to lessen sinus pain and pressure.   Nasal decongestant medicine. Spray or drops may help to lessen congestion. Do not use them for more than a few days.   Salt wash (saline irrigation). This can help to loosen mucus.  Possible complications of ABRS  ABRS may come back or become long-term (chronic). In rare cases, ABRS may cause complications such as:   Inflamed tissue around the brain and spinal cord (meningitis)   Inflamed tissue around the eyes (orbital cellulitis)   Inflamed bones around the sinuses (osteitis)  These problems may need to be treated in a hospital with intravenous (IV) antibiotic medicine or surgery.  When to call the healthcare provider  Call your healthcare provider if you have any of the following:   Symptoms that don't get better, or get worse   Symptoms that don't get better after 3 to 5 days on antibiotics   Trouble seeing   Swelling around your eyes   Confusion or trouble staying awake  [employer] last reviewed this educational content on 05/07/2015   2000-2019 [employer] [address], [place], [place] [id number]. All rights reserved. This information is not intended as a substitute for professional medical care. Always follow your healthcare professional's instructions.

## 2017-11-10 ENCOUNTER — Ambulatory Visit (INDEPENDENT_AMBULATORY_CARE_PROVIDER_SITE_OTHER): Payer: 59 | Admitting: Internal Medicine

## 2017-11-10 ENCOUNTER — Encounter (INDEPENDENT_AMBULATORY_CARE_PROVIDER_SITE_OTHER): Payer: Self-pay | Admitting: Internal Medicine

## 2017-11-10 VITALS — BP 157/88 | HR 49 | Temp 98.0°F | Ht 62.25 in | Wt 154.0 lb

## 2017-11-10 DIAGNOSIS — Z1211 Encounter for screening for malignant neoplasm of colon: Secondary | ICD-10-CM

## 2017-11-10 DIAGNOSIS — Z9884 Bariatric surgery status: Secondary | ICD-10-CM

## 2017-11-10 DIAGNOSIS — Z1231 Encounter for screening mammogram for malignant neoplasm of breast: Secondary | ICD-10-CM

## 2017-11-10 NOTE — Progress Notes (Signed)
Subjective:      Patient ID: Tracy [name] is a 52 y.o. female     Chief Complaint   Patient presents with   . Establish Care   . ED Follow Up     H/o gastric bypassnot on  on replacement medication    Behind preventive  HPI     The following sections were reviewed this encounter by the provider: Meds           Review of Systems     Constitutional: Negative for fever, activity change, appetite change, fatigue and unexpected weight change.   HENT: Negative for ear pain, congestion, sore throat, rhinorrhea, neck pain, dental problem, postnasal drip and sinus pressure.   Eyes: Negative for discharge, redness and visual disturbance.   Respiratory: Negative for coug  Cardiovascular: Negative for chest pain, palpitations and leg swelling.   Gastrointestinal: Negative for nausea, vomiting, abdominal pain, diarrhea, constipation joint swelling and arthralgias.   Skin: Negative for pallor and rash.   Neurological: Negative for dizziness, weakness, light-headedness, numbness and headaches.   Hematological: Negative for adenopathy.   Psychiatric/Behavioral: Negative for sleep disturbance, dysphoric mood and decreased concentration. The patient is not nervous/anxious.      BP 157/88 (BP Site: Left arm, Patient Position: Sitting)   Pulse (!) 49   Temp 98 F (36.7 C) (Oral)   Ht 1.581 m (5' 2.25")   Wt 69.9 kg (154 lb)   BMI 27.94 kg/m     Objective:     Physical Exam     General appearance - alert, well appearing, and in no distress  Mental status - alert, oriented to person, place, and time  Eyes - pupils equal and reactive, extraocular eye movements intact  Ears - bilateral TM's and external ear canals normal  Nose - normal and patent, no erythema  Mouth - mucous membranes moist,  Neck - supple, no significant   Chest - clear to auscultation, no wheezes, rales or rhonchi, symmetric air entry  Heart - normal rate, regular rhythm, normal S1, S2, no murmurs, rubs, clicks or gallops  findings or movement disorder  noted  Musculoskeletal - no joint tenderness, deformity or swellin  Skin - normal coloration and turgor, no rashes, no suspicious skin lesions noted    Patient Active Problem List   Diagnosis   . Ileitis   . Abdominal pain   . Nausea & vomiting   . Diarrhea   . Migraine   . Uncontrolled hypertension   . Abdominal cyst   . Hiatal hernia   . Hypokalemia   . S/P gastric bypass   . Frequent bowel movements   . Acute rhinosinusitis     Current Outpatient Medications   Medication Sig Dispense Refill   . Biotin 5 MG/ML Liquid Take by mouth     . butalbital-acetaminophen-caffeine (FIORICET, ESGIC) 50-325-40 MG per tablet Take 1 tablet by mouth every 4 (four) hours as needed for Headaches 10 tablet 0   . calcium-vitamin D (OSCAL-500 + D) 500-200 MG-UNIT per tablet Take 1 tablet by mouth daily     . Cyanocobalamin 5000 MCG SL Tab by Nasal route once a week     . doxycycline (VIBRA-TABS) 100 MG tablet Take 1 tablet (100 mg total) by mouth 2 (two) times daily for 7 days 14 tablet 0   . Multiple Vitamins-Minerals (MULTIVITAMIN WITH MINERALS) tablet Take 1 tablet by mouth daily     . SYNTHROID 112 MCG tablet Take 1 tablet (112 mcg  total) by mouth Once a day at 6:00am 90 tablet 1     No current facility-administered medications for this visit.      Allergies   Allergen Reactions   . Bee Pollen Swelling     Other reaction(s): Cough, Cough (ALLERGY/intolerance)  Stuffy nose  Stuffy nose  "Bee Stings".Marland KitchenMarland KitchenMarland KitchenStuffy nose; "throat swelling"     . Vicodin [Hydrocodone-Acetaminophen] Nausea And Vomiting       Assessment:/plan     1. Screening mammogram, encounter for  - Mammo Digital Screening Bilateral W Cad; Future    2. Screen for colon cancer  - Gastroenterology Referral: Joanne Gavel, MD [facility]. [facility] [place])    3. History of gastrointestinal tract bypass  - Bariatric Surgery Referral: Adline Mango, MD ([place])  - CBC and differential  - Comprehensive Metabolic Panel  - Vitamin D,25 OH, Total  - Vitamin B12  - Hemoglobin A1c                Pershing Cox, MD

## 2017-11-16 ENCOUNTER — Other Ambulatory Visit: Payer: Self-pay | Admitting: Internal Medicine

## 2017-11-16 NOTE — Progress Notes (Signed)
Received fax from [facility] to switch Synthroid to generic, pt previously stated she wanted to stay on brand on 10/21/17.

## 2017-12-21 ENCOUNTER — Encounter (INDEPENDENT_AMBULATORY_CARE_PROVIDER_SITE_OTHER): Payer: Self-pay | Admitting: Gastroenterology

## 2017-12-21 ENCOUNTER — Ambulatory Visit (INDEPENDENT_AMBULATORY_CARE_PROVIDER_SITE_OTHER): Payer: 59 | Admitting: Gastroenterology

## 2017-12-21 VITALS — BP 149/84 | HR 54 | Ht 62.24 in | Wt 149.4 lb

## 2017-12-21 DIAGNOSIS — R634 Abnormal weight loss: Secondary | ICD-10-CM

## 2017-12-21 DIAGNOSIS — R197 Diarrhea, unspecified: Secondary | ICD-10-CM

## 2017-12-21 DIAGNOSIS — K529 Noninfective gastroenteritis and colitis, unspecified: Secondary | ICD-10-CM

## 2017-12-21 DIAGNOSIS — Z9884 Bariatric surgery status: Secondary | ICD-10-CM

## 2017-12-21 DIAGNOSIS — R1084 Generalized abdominal pain: Secondary | ICD-10-CM

## 2017-12-21 MED ORDER — PEG 3350-KCL-NABCB-NACL-NASULF 236 G PO SOLR
2.00 L | Freq: Two times a day (BID) | ORAL | 0 refills | Status: AC
Start: 2017-12-21 — End: 2017-12-22

## 2017-12-21 NOTE — Patient Instructions (Addendum)
Avynn Klassen Marland Kitchen, MD  [address]  [place] [address]    Patient Name: Tracy [name]                                                                                                                Procedure: Colonoscopy/EGD                                                            Date Of Procedure: Jan 22th                                                                                           Time Of Procedure: 9:00                                                                                 Arrival Time: 8:00      FIVE DAYS PRIOR TO PROCEDURE:    1-Do not eat any fruits or vegetables with seeds or skin.  You may de-seed or peel the food and eat it that way.  Avoid foods such as tomatoes, grapes,CORN, broccoli and peppers and beans.    2-No leafy greens and no knots of any kind.     THE DAY BEFORE THE PROCEDURE:    1-Clear liquid all day, absolutely no solid foods starting from that time you wake up             #-Chicken broth; Jell-O [no fruits ] only in lemon, lime or arrange flavors; Popsicles [no sherbet or fruit bars ] only in lemon, lime or orange.             #-Water, coffee, tea [no milk or creamers ], sweeteners are okay.             #-Carbonated beverages: Orange, ginger ale,Sprite, cola, etc.             #-No red liquids.    2-No dairy products all day.    3-At 6 PM drink half gallon of GoLYTELY[prescription will be sent to your pharmacy].    4-After midnight nothing to eat or drink until after the procedure. [Except  the remaining GoLYTELY].     THE DAY OF THE PROCEDURE:    1- Drink the second half gallon of the GoLYTELY 4 to 5 hours prior to procedure time.    2-Report to the facility at the time listed above, please note you should be at   Colonoscopy     A camera attached to a flexible tube with a viewing lens is used to take video pictures.   Colonoscopyis a test to view the inside of your lower digestive tract (colon and rectum).Sometimes it can show the  last part of the small intestine (ileum).During the test, small pieces of tissue may be removed for testing. This is called a biopsy. Small growths, such as polyps, may also be removed.  Why is colonoscopy done?  The test is done to help look for colon cancer. And it can help find the source of abdominal pain,bleeding,and changes in bowel habits. It may be needed once a year to every 10 years, depending on factors such as your:   Age   Health history   Family health history   Symptoms   Results from any prior colonoscopy  Risks and possible complications  These include:   Bleeding   A puncture or tear in the colon   Risks of anesthesia   A cancer lesion not being seen or fully removed  Getting ready  To prepare for the test:   Talk with your healthcare provider about the risks of the test (see below). Also ask your healthcare provider about alternatives to the test.   Tell your healthcare provider about any medicines and supplements you take. Alsotell him or her about any health conditions you may have.   Make sure your rectum and colon are empty for the test. Follow the diet and bowel prep instructions exactly. If you don't, the test may need to be rescheduled.   Plan for a friend or family member to drive you home after the test.     Colonoscopy provides an inside view of the entire colon.     You may discuss the results with your doctor right away or at a future visit.  During the test  The test is usually done in the hospital on an outpatient basis or at an outpatient clinic. This means you go home the same day. The procedure takes about . During that time:   You are given relaxing (sedating) medicine through an IV line.You may be drowsy, or fully asleep.   The healthcare provider will first give you a physical exam to check for anal andrectal problems.   Then the anus is lubricated and the scope inserted.   If you are awake, you may have a feeling similar to  needing to have a bowelmovement. You may also feel pressure as air is pumped into the colon. It'sOK to pass gas during the procedure.   Biopsy, polyp removal, or other treatments may be done during the test.  After the test  You may have gas right after the test. It can help to try to pass it to help prevent later bloating. Your healthcare provider may discuss the results with you right away. Or you may need to schedule a follow-up visit to talk about the results. After the test, you can go back to your normal eating andother activities. You may be tired from the sedation and need to rest for a few hours. Discuss your medicines with your provider to understand if they can be restarted right  away.  StayWell last reviewed this educational content on 06/06/2017   2000-2019 [employer], [place]. [address] [place], [place] [address]. All rights reserved. This information is not intended as a substitute for professional medical care. Always follow your healthcare professional's instructions.        Diarrhea with Uncertain Cause (Adult)    Diarrhea is when stools are loose and watery. This can be caused by:   Viral infections   Bacterial infections   Food poisoning   Parasites   Irritable bowel syndrome (IBS)   Inflammatory bowel diseases such as ulcerative colitis, Crohn's disease, and celiac disease   Food intolerance, such as to lactose, the sugar found in milk and milk products   Reaction to medicines like antibiotics, laxatives, cancer drugs, and antacids  Along with diarrhea, you may also have:   Abdominal pain and cramping   Nausea and vomiting   Loss of bowel control   Fever and chills   Bloody stools  In some cases, antibiotics may help to treat diarrhea. You may have a stool sample test. This is done to see what is causing your diarrhea, and if antibiotics will help treat it. The results of a stool sample test may take up to 2 days. The healthcare provider may not give you antibiotics  until he or she has the stool test results.  Diarrhea can cause dehydration. This is the loss of too much water and other fluids from the body. When this occurs, body fluid must be replaced. This can be done with oral rehydration solutions. Oral rehydration solutions are available at drugstores and grocery stores without a prescription. Sports drinks are not the best choice if you are very dehydrated. They have too much sugar and not enough electrolytes.  Home care  Follow all instructions given by your healthcare provider. Rest at home for the next 24 hours, or until you feel better. Avoid caffeine, tobacco, and alcohol. These can make diarrhea, cramping, and pain worse.  If taking medicines:   Over-the-counter nausea and diarrhea medicines are generally OK unless you experience fever or blood stool. Check with your doctor first in those circumstances.   You may use acetaminophen or NSAID medicines like ibuprofen or naproxen to reduce pain and fever. Don't use these if you have chronic liver or kidney disease, or ever had a stomach ulcer or gastrointestinalbleeding. Don't use NSAID medicines if you are already taking one for another condition (like arthritis) or are on daily aspirin therapy (such as for heart disease or after a stroke). Talk with your healthcare provider first.   If antibiotics were prescribed, be sure you take them until they are finished. Don't stop taking them even when you feel better. Antibiotics must be taken as a full course.  To prevent the spread of illness:   Remember that washing with soap and water and using alcohol-based sanitizer is the best way to prevent the spread of infection. Dry your hands with a single use towel (like a paper towel).   Clean the toilet after each use.   Wash your hands before eating.   Wash your hands before and after preparing food. Keep in mind that people with diarrhea or vomiting should not prepare food for others.   Wash your hands after using  cutting boards, countertops, and knives that have been in contact with raw foods.   Wash and then peel fruits and vegetables.   Keep uncooked meats away from cooked and ready-to-eat foods.  Use a food thermometer when cooking. Cook poultry to at least 165F (74C). Cook [employer] (beef, veal, pork, lamb) to at least 160F (71C). Cook fresh beef, veal, lamb, and pork to at least 145F (63C).   Don't eat raw or undercooked eggs (poached or sunny side up), poultry, meat, or unpasteurized milk and juices.  Food and drinks  The main goal while treating vomiting or diarrhea is to prevent dehydration. This is done by taking small amounts of liquids often.   Keep in mind that liquids are more important than food right now.   Drink only small amounts of liquids at a time.   Don't force yourself to eat, especially if you arehaving cramping, vomiting, or diarrhea. Don't eat large amounts at a time, even if you are hungry.   If you eat, avoid fatty, greasy, spicy, or fried foods.   Don't eat dairy foods or drink milk if you have diarrhea.These can makediarrhea worse.  During the first 24 hours you can try:   Oral rehydration solutions. Sports drinks may be used if you are not too dehydrated and are otherwise healthy.   Soft drinks without caffeine   Ginger ale   Water (plain or flavored)   Decaf tea or coffee   Clear broth, consomm, or bouillon   Gelatin, popsicles, or frozen fruit juice bars  The second 24 hours, if you are feeling better, you can add:   Hot cereal, plain toast, bread, rolls, or crackers   Plain noodles, rice, mashed potatoes, chicken noodle soup, or rice soup   Unsweetened canned fruit (no pineapple)   Bananas  As you recover:   Limit fat intake to less than 15 grams per day. Don't eat margarine, butter, oils, mayonnaise, sauces, gravies, fried foods, peanut butter, meat, poultry, or fish.   Limit fiber. Don't eat raw or cooked vegetables, fresh fruits except bananas, or bran  cereals.   Limit caffeine and chocolate.   Limit dairy.   Don't use spices or seasonings except salt.   Go back to your normal diet over time, as you feel better and your symptoms improve.   If the symptoms come back, go back to a simple diet or clear liquids.  Follow-up care  Follow up with your healthcare provider, or as advised. If a stool sample was taken or cultures were done, call the healthcare provider for the results as instructed.  Call 911  Call 911 if you have any of these symptoms:   Trouble breathing   Confusion   Extreme drowsiness or trouble walking   Loss of consciousness   Rapid heart rate   Chest pain   Stiff neck   Seizure  When to seek medical advice  Call your healthcare provider right away if any of these occur:   Abdominal pain that gets worse   Constant lower right abdominal pain   Continued vomiting and inability to keep liquids down   Diarrhea more than 5 times a day   Blood in vomit or stool   Dark urine or no urine for 8 hours, dry mouth and tongue, tiredness, weakness, or dizziness   Drowsiness   New rash   You don't get better in 2 to 3 days   Fever of 100.29F (38C) or higher, or as directed by your healthcare provider  StayWell last reviewed this educational content on 06/06/2016   2000-2019 [employer], [place]. [address] [place], [place] [address]. All rights reserved. This information is not intended  as a substitute for professional medical care. Always follow your healthcare professional's instructions.      least an hour before you procedure time.    3-The procedure will not be performed if you do not have someone to drive you to and from the facility.    4-If you are diabetic please do not take your insulin that morning.    5-Make sure you take your blood pressure medications and cardiac medications[if applicable] the morning of the procedure with a small sip of water.    6-Someone will call you from the facility and our office regarding your  appointment, please be sure to call us if you have any questions or have not heard from the facility.      If you are on aspirin or Plavix it is recommended that you do not stop taking these medications to minimize your risk of a heart attack or stroke, as with any procedure, there is a low but increased risk of bleeding with certain procedures [biopsies/removal of polyps ] while on these medications.          THE PROCEDURE WILL BE AT THIS FACILITY:          []     []  [facility]     If your procedure is to be done at [facility], please report to the registration desk at the yellow entrance 1 HOUR prior to your scheduled procedure.    [address]  [place], [place] [address]  [phone]

## 2017-12-21 NOTE — Progress Notes (Signed)
Shamal Stracener Lemar Lofty, M.D.  [facility]  [address], [address]  [place], [place].  [address]  Phone:[phone]  Fax:[phone]        Date Time: 12/21/2017 11:40 AM  Patient Name: Tracy [name]  Referring Physician:  Teena Dunk, MD       Reason for Consultation:   Diarrhea/weight loss/abdominal pain    History:   Tracy [name] is a 52 y.o. female who presents to the office for diarrhea and weight loss.  Patient has extensive past medical history and she also had gastric sleeve in 2014 then had in 2017 a follow-up surgery where was revised and then she had a gastric bypass, she had a cholecystectomy with that, since then she has been having frequent stooling up to 7 times a day with soft watery stool and mucus but no blood, along with that she has abdominal pain that was described as diffuse intermittent with moderate intensity associated with nausea but no vomiting patient has lost about 90 pounds since her last surgery and has poor appetite.  She indicated her diarrhea is not related to meals and there is no reported fever or chills, patient never had colonoscopy in the past and no upper endoscopy since her last surgery.  Her surgery was done at [facility] and she is going to get [place] the detailed report of that surgery.    Past Medical History:     Past Medical History:   Diagnosis Date   . Carotid stenosis    . Disorder of thyroid    . Essential hypertension    . Fatty liver 2017   . GERD (gastroesophageal reflux disease)    . Hypercholesteremia    . Migraines    . Sleep apnea    . Vitamin D deficiency        Past Surgical History:     Past Surgical History:   Procedure Laterality Date   . CHOLECYSTECTOMY  12/2016   . LAPAROSCOPIC REVISION SLEEVE GASTRECTOMY TO GASTRIC BYPASS     . LAPAROSCOPIC, GASTRECTOMY SLEEVE     . THYROIDECTOMY  2013   Sleeve 2014/ gastric bypass` 2018  No colonoscopy   Family History:     Family History   Problem Relation Age of Onset   . Cancer Mother    .  Diabetes Mother    . Heart disease Mother    . Heart attack Mother    . Hypertension Mother    . Inflammatory bowel disease Mother    . Cancer Father    . Asthma Father    . Diabetes Father    . Hyperlipidemia Father    . Hypertension Father    . Asthma Sister    . Diabetes Sister    . Hyperlipidemia Sister    . Hypertension Sister    . Cancer Brother    . Hyperlipidemia Brother    . Hypertension Brother        Social History:     Social History     Socioeconomic History   . Marital status: Significant Other     Spouse name: Not on file   . Number of children: Not on file   . Years of education: Not on file   . Highest education level: Not on file   Occupational History   . Not on file   Social Needs   . Financial resource strain: Not on file   . Food insecurity:     Worry: Not on  file     Inability: Not on file   . Transportation needs:     Medical: Not on file     Non-medical: Not on file   Tobacco Use   . Smoking status: Never Smoker   . Smokeless tobacco: Never Used   Substance and Sexual Activity   . Alcohol use: No   . Drug use: No   . Sexual activity: Not on file   Lifestyle   . Physical activity:     Days per week: Not on file     Minutes per session: Not on file   . Stress: Not on file   Relationships   . Social connections:     Talks on phone: Not on file     Gets together: Not on file     Attends religious service: Not on file     Active member of club or organization: Not on file     Attends meetings of clubs or organizations: Not on file     Relationship status: Not on file   . Intimate partner violence:     Fear of current or ex partner: Not on file     Emotionally abused: Not on file     Physically abused: Not on file     Forced sexual activity: Not on file   Other Topics Concern   . Not on file   Social History Narrative   . Not on file       Allergies:     Allergies   Allergen Reactions   . Bee Pollen Swelling     Other reaction(s): Cough, Cough (ALLERGY/intolerance)  Stuffy nose  Stuffy nose  "Bee  Stings".Marland KitchenMarland KitchenMarland KitchenStuffy nose; "throat swelling"     . Vicodin [Hydrocodone-Acetaminophen] Nausea And Vomiting       Medications:     Current Outpatient Medications:   .  Biotin 5 MG/ML Liquid, Take by mouth, Disp: , Rfl:   .  butalbital-acetaminophen-caffeine (FIORICET, ESGIC) 50-325-40 MG per tablet, Take 1 tablet by mouth every 4 (four) hours as needed for Headaches, Disp: 10 tablet, Rfl: 0  .  calcium-vitamin D (OSCAL-500 + D) 500-200 MG-UNIT per tablet, Take 1 tablet by mouth daily, Disp: , Rfl:   .  Cyanocobalamin 5000 MCG SL Tab, by Nasal route once a week, Disp: , Rfl:   .  Multiple Vitamins-Minerals (MULTIVITAMIN WITH MINERALS) tablet, Take 1 tablet by mouth daily, Disp: , Rfl:   .  SYNTHROID 112 MCG tablet, Take 1 tablet (112 mcg total) by mouth Once a day at 6:00am, Disp: 90 tablet, Rfl: 1  .  polyethylene glycol (GOLYTELY) 236 g suspension, Take 2,000 mLs by mouth 2 (two) times daily for 2 doses, Disp: 4000 mL, Rfl: 0    Review of Systems:   Constitutional:No fever,chills, positive for weight loss.  Integument:No skin rashes or Jaundice  Eyes:No changes in vision  ENMT:No changes in hearing,nasal discharge,sore throat,oral lesions.  Respiratory:positive for wheezing and cough.  Cardiovascular:No chest pain or palpitations.  Genitourinary:No urinary frequency, dysuria or gross hematuria.  Gastrointestinal:See HPI.  Musculoskeletal:positive for  back pain but no joint pain.  Neurologic:No slurred speech, hemiplegia or focal deficit.  Hematologic:No easy bruising.  Psychiatric:No depression or anxiety.     Physical Exam:     Vitals:    12/21/17 1050   BP: 149/84   BP Site: Left arm   Pulse: (!) 54   Weight: 67.8 kg (149 lb 6.4 oz)   Height: 1.581 m (5'  2.24")         General appearance - Well developed and well nourished, no acute distress.  HEENT- Atraumatic, normocephalic. Anicteric not pale sclera,  Nares normal.  No oral lesions. Teeth  Neck - Supple.  No raised JVD.  Chest - Lungs are clear, no wheezing,  rhonchi, or rales.  Heart - regular rate and rhythm without murmurs.  Abdomen - Soft, not distended, not tender, no hepatosplenomegaly no masses, bowel sounds are normoactive, no ascites.  Rectal - Deferred.  Musculoskeletal - No elicited weakness.   Extremities - No edema.  Skin - no rashes or lesions.  Neurologic - Alert and oriented times 3.  No focal motor or sensory deficits.  No asterixis.  Recent and remote memory normal.     Labs Reviewed:   All available lab results reviewed and showed mild anemia        Rads:   Patient had a CT scan that was done in August 2019 that was reviewed showed mild thickening of the distal small bowel near the terminal ileum that could be consistent with ileitis she also has a small hiatus hernia and a soft tissue mass in the umbilicus area consistent with benign cyst.  Study Result     INDICATION: Abdominal and right lower quadrant pain. Worsening for 3  days. Nausea and diarrhea. History of gastric bypass and  cholecystectomy.    COMPARISON: No prior study.    TECHNIQUE:  Axial CT images were obtained for abdominal imaging from the  lung bases through the iliac crest and from the iliac crest to the  perineum for pelvic imaging. 100 cc's of Omnipaque 350 was utilized for  intravenous enhancement. Coronal and sagittal reconstruction and  reformatted images performed. A combination of automatic exposure  control and adjustment of the air may and/or KV was utilized according  to the patient size. Oral contrast was ordered and administered.    INTERPRETATION:    Lung bases-unremarkable. Small hiatal hernia.  Liver-mild fatty infiltration. No focal mass.  Gallbladder-surgically removed.  Spleen-normal.  Adrenal glands-normal.  Pancreas-normal.  Retroperitoneum-normal caliber vessels. No retrocrural or  retroperitoneal adenopathy. No iliac or significant inguinal adenopathy.  Kidneys-no obstruction or mass.  Bladder-unremarkable.  Reproductive-vaginal cuff is unremarkable. Ovaries  are visualized and  within normal limits of size.  Bowel-there is soft tissue behind the umbilicus which has the appearance  of a benign cyst or mass. Clinical correlation. There is mild thickening  of the distal small bowel near the terminal ileum with stranding system  with mild inflammation. Appendix is normal. No bowel obstruction. Focal  thickening of the distal ileum with stranding in the adjacent mesentery.    IMPRESSION:     Mild thickening of the distal small bowel near the terminal  ileum with stranding in the adjacent mesentery. Findings are consistent  with ileitis. No bowel obstruction. No free fluid.  Normal appendix.  Small hiatal hernia.  Soft tissue mass behind the umbilicus most consistent with a benign cyst  or mass. Clinical correlation.    Kinnie Feil, MD   08/14/2017 11:14 A         Assessment:   1. Diarrhea  2. Weight loss  3. Abdominal pain  4. Ileitis on CT scan  5. Status post gastric bypass    Plan:   1. Patient's diarrhea could be a result of her ileitis or it could be due to the bypass but we need to rule out other etiologies and for that  patient will need to have colonoscopy with biopsies and also visualization of the terminal ileum to rule out any ileitis.  2. Patient's weight loss could be related to her gastric bypass and to the chronic diarrhea and will plan to do upper endoscopy and colonoscopy for further evaluation and work-up of her weight loss.  3. Abdominal pain?  Could be related to ileitis and for that work-up will be done also to rule out any peptic ulcer disease or marginal ulcer from her surgery.  4. Patient had gastric sleeve and then revision and then gastric bypass details of the surgery is not available I asked the patient to bring her records where she stated that she had her surgery records with her so she is going centimeters and will review that.  5. Discussed with patient all above in detail and the work-up and she will schedule to have this done  soon.    Signed by: Shanon Brow, MD    All recent labs and radiologic studies were reviewed at the time of the visit. Thank you for allowing me to participate in the care of your patient.

## 2017-12-22 ENCOUNTER — Encounter (INDEPENDENT_AMBULATORY_CARE_PROVIDER_SITE_OTHER): Payer: Self-pay | Admitting: Gastroenterology

## 2018-01-19 ENCOUNTER — Ambulatory Visit (INDEPENDENT_AMBULATORY_CARE_PROVIDER_SITE_OTHER): Payer: 59 | Admitting: Family Medicine

## 2018-01-19 ENCOUNTER — Telehealth (INDEPENDENT_AMBULATORY_CARE_PROVIDER_SITE_OTHER): Payer: Self-pay | Admitting: Internal Medicine

## 2018-01-19 ENCOUNTER — Ambulatory Visit
Admission: RE | Admit: 2018-01-19 | Discharge: 2018-01-19 | Disposition: A | Discharge status: Home / Self Care | Payer: 59 | Source: Ambulatory Visit | Attending: Internal Medicine | Admitting: Internal Medicine

## 2018-01-19 ENCOUNTER — Other Ambulatory Visit (INDEPENDENT_AMBULATORY_CARE_PROVIDER_SITE_OTHER): Payer: Self-pay | Admitting: Internal Medicine

## 2018-01-19 ENCOUNTER — Ambulatory Visit: Payer: 59

## 2018-01-19 ENCOUNTER — Encounter (INDEPENDENT_AMBULATORY_CARE_PROVIDER_SITE_OTHER): Payer: Self-pay | Admitting: Family Medicine

## 2018-01-19 VITALS — BP 149/78 | HR 49 | Temp 97.8°F | Resp 19 | Ht 62.25 in | Wt 147.4 lb

## 2018-01-19 DIAGNOSIS — R0789 Other chest pain: Secondary | ICD-10-CM

## 2018-01-19 DIAGNOSIS — Z9884 Bariatric surgery status: Secondary | ICD-10-CM

## 2018-01-19 DIAGNOSIS — Z131 Encounter for screening for diabetes mellitus: Secondary | ICD-10-CM

## 2018-01-19 DIAGNOSIS — E890 Postprocedural hypothyroidism: Secondary | ICD-10-CM

## 2018-01-19 DIAGNOSIS — N644 Mastodynia: Secondary | ICD-10-CM

## 2018-01-19 DIAGNOSIS — Z1231 Encounter for screening mammogram for malignant neoplasm of breast: Secondary | ICD-10-CM

## 2018-01-19 NOTE — Progress Notes (Signed)
Have you seen any specialists/other providers since your last visit with [place]?    Yes, previous PCP, GI    Arm preference verified?   Yes    The patient is due for influenza vaccine and [facility] care plan letter

## 2018-01-19 NOTE — Progress Notes (Signed)
Subjective:       Patient ID: Tracy [name] is a 53 y.o. female.    HPI  C/o L breast pain and chest pain x 3 weeks, intermittent. Sometimes exertional but not associated with dizziness, diaphoresis. No prior CAD.    Here as a new patient, needing labs done since PCP left and his prior orders were cancelled. Pt was being monitored for her hx of gastric surgery.    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  Lab Results   Component Value Date    TSH 11.260 (H) 10/13/2017    TSH 17.07 (H) 08/25/2017     The following portions of the patient's history were reviewed and updated as appropriate: allergies, current medications, past family history, past medical history, past social history, past surgical history and problem list.    Review of Systems   Constitutional: Negative for fatigue.   HENT: Negative for trouble swallowing.    Respiratory: Negative for chest tightness and shortness of breath.    Cardiovascular: Positive for chest pain. Negative for palpitations and leg swelling.   Gastrointestinal: Negative for constipation and diarrhea.   Endocrine: Negative for cold intolerance and heat intolerance.   Neurological: Negative for light-headedness and headaches.           Objective:    Physical Exam  Vitals signs reviewed.   Constitutional:       General: She is not in acute distress.     Appearance: She is not ill-appearing.   Cardiovascular:      Rate and Rhythm: Normal rate and regular rhythm.      Pulses: Normal pulses.      Heart sounds: Normal heart sounds. No murmur.   Pulmonary:      Effort: Pulmonary effort is normal. No respiratory distress.      Breath sounds: Normal breath sounds. No wheezing or rales.   Chest:      Chest wall: Tenderness present.      Breasts: Breasts are symmetrical.         Right: Normal. No swelling, bleeding, inverted nipple, mass, nipple discharge, skin change or tenderness.         Left: Tenderness  present. No swelling, bleeding, inverted nipple, mass, nipple discharge or skin change.       Neurological:      Mental Status: She is alert.       BP 149/78 (BP Site: Right arm, Patient Position: Sitting, Cuff Size: Large)   Pulse (!) 49   Temp 97.8 F (36.6 C) (Oral)   Resp 19   Ht 1.581 m (5' 2.25")   Wt 66.9 kg (147 lb 6.4 oz)   BMI 26.74 kg/m       Assessment:       1. Postsurgical hypothyroidism  TSH   2. History of gastric bypass  Vitamin D,25 OH, Total    CBC and differential    Comprehensive metabolic panel    Vitamin B12   3. Screening for diabetes mellitus  Hemoglobin A1C   4. Atypical chest pain  Mammo Digital Diagnostic Left W Cad    ECG 12 lead         Plan:      Procedures  Orders Placed This Encounter   Procedures   . Mammo Digital Diagnostic Left W Cad     Standing Status:   Future     Standing Expiration Date:  03/20/2019     Scheduling Instructions:      -----Gillie Manners-----            [facility] [place]      [address]      [place], [place] [address]      Phone: [phone]            [facility] [place]      [address] [place], [place] [address]      Phone: [phone]            [facility]      [address]      [place], [place] [address]      Phone: [phone]            [facility]      [address]      [place], [place] [address]      Phone: [phone]            [facility]      [address]      [place], [place] [address]      Phone: [phone]            -----[facility]-----            [facility]      [id number]      [place], [place] [address]      Phone: [phone]            -----Piedad Climes-----            [facility]      [address]      [place], [place] [address]      Phone: [phone]            [facility] [place]      [address]      [place], [place] [address]       Phone: [phone]            [facility]      [address]      [place], [place] [address]      Phone: [phone]            [facility] [place]      [address]      [place], [place] [address]      Phone: [phone]            -----[employer]-----            [facility] [place]      [address]      [place], [place] [address]      Phone: [phone]            [facility]      [address]      [place], [place] [address]      Phone: [phone]            [facility]      [address]      [place], [place] [address]      Phone: [phone]            [facility]      [address]  [place], [place] [address]      Phone: [phone]            -----Lajoyce Lauber-----            [facility]      [address]      [place], [place] [address]      Phone: [phone]            -----[facility]-----            [facility]      [address]      [place], [place] [address]      Phone: [phone]            [facility] [place]      [address]      [place], [place] [address]      Phone: [phone]            [facility]      [address]      [place], [place] [address]      Phone: [phone]            [facility] [place]      [address]      [place], [place] [address]      Phone: [phone]     Order Specific Question:   Select additional PRN/as needed imaging orders     Answer:   Breast ultrasound     Order Specific Question:   Is the patient pregnant?     Answer:   No     Order Specific Question:   Reason for Exam:     Answer:   L-sided chest/breast pain   . TSH   . Vitamin D,25 OH, Total   . CBC and differential    . Comprehensive metabolic panel     Order Specific Question:   Has the patient fasted?     Answer:   No   . Vitamin B12   . Hemolysis index     Has the patient fasted?->No   . GFR     Has the patient fasted?->No   . Hemoglobin A1C   . ECG 12 lead       1. History of gastric bypass  - Obtain labs below. Pt currently not on supplements.  - Vitamin D,25 OH, Total  - CBC and differential  - Comprehensive metabolic panel  - Vitamin B12    2. Postsurgical hypothyroidism  - Obtain TSH today and adjust Synthroid dose as needed.    3. Screening for diabetes mellitus  - Hemoglobin A1C    4. Atypical chest pain  - Likely muscular since also having back pain. Advised correcting posture.  - Mammo Digital Diagnostic Left W Cad; Future  - ECG 12 lead ordered by pt declined to have it done today since having to leave.      RTC as scheduled in feb 2020 to establish care or sooner if labs abnormal

## 2018-01-19 NOTE — Telephone Encounter (Signed)
Pt called - is concerned since Dr. Jonetta Osgood is not longer with [facility] who will be reviewing all the results of orders and consult notes from specialist she was referred to.     Pt was advised regarding pool of providers reviewing Dr. Jonetta Osgood patient results and requests. Pt has scheduled an apt w/ Dr. Doreene Burke - [facility] Arthurs to [facility] on 02/16/18, however pt request to be seen sooner if needed to discuss any results or if  additional referrals are needed.

## 2018-01-19 NOTE — Telephone Encounter (Addendum)
Darl Pikes from [facility](ph#[phone] or [phone]) contacted the office requesting a new lab order be placed for this patient. The previous labs (11/10/17) were discontinued. Please assist. Contact the patient once this has been completed.   [phone]

## 2018-01-19 NOTE — Telephone Encounter (Signed)
Patient has appointment today with new primary care physician

## 2018-01-20 ENCOUNTER — Other Ambulatory Visit (INDEPENDENT_AMBULATORY_CARE_PROVIDER_SITE_OTHER): Payer: Self-pay | Admitting: Family Medicine

## 2018-01-20 ENCOUNTER — Other Ambulatory Visit (FREE_STANDING_LABORATORY_FACILITY): Payer: 59

## 2018-01-20 ENCOUNTER — Encounter (INDEPENDENT_AMBULATORY_CARE_PROVIDER_SITE_OTHER): Payer: Self-pay

## 2018-01-20 ENCOUNTER — Ambulatory Visit
Admission: RE | Admit: 2018-01-20 | Discharge: 2018-01-20 | Disposition: A | Discharge status: Home / Self Care | Payer: 59 | Source: Ambulatory Visit | Attending: Family Medicine | Admitting: Family Medicine

## 2018-01-20 ENCOUNTER — Ambulatory Visit (INDEPENDENT_AMBULATORY_CARE_PROVIDER_SITE_OTHER): Payer: 59

## 2018-01-20 ENCOUNTER — Telehealth (INDEPENDENT_AMBULATORY_CARE_PROVIDER_SITE_OTHER): Payer: Self-pay | Admitting: Family Medicine

## 2018-01-20 ENCOUNTER — Telehealth: Payer: 59

## 2018-01-20 ENCOUNTER — Encounter (INDEPENDENT_AMBULATORY_CARE_PROVIDER_SITE_OTHER): Payer: Self-pay | Admitting: Family Medicine

## 2018-01-20 ENCOUNTER — Ambulatory Visit
Admission: RE | Admit: 2018-01-20 | Discharge: 2018-01-20 | Disposition: A | Discharge status: Home / Self Care | Payer: 59 | Source: Ambulatory Visit | Attending: Internal Medicine | Admitting: Internal Medicine

## 2018-01-20 DIAGNOSIS — E890 Postprocedural hypothyroidism: Secondary | ICD-10-CM | POA: Insufficient documentation

## 2018-01-20 DIAGNOSIS — R0789 Other chest pain: Secondary | ICD-10-CM

## 2018-01-20 DIAGNOSIS — N644 Mastodynia: Secondary | ICD-10-CM

## 2018-01-20 DIAGNOSIS — Z131 Encounter for screening for diabetes mellitus: Secondary | ICD-10-CM

## 2018-01-20 DIAGNOSIS — Z9884 Bariatric surgery status: Secondary | ICD-10-CM

## 2018-01-20 DIAGNOSIS — E876 Hypokalemia: Secondary | ICD-10-CM

## 2018-01-20 LAB — COMPREHENSIVE METABOLIC PANEL
ALT: 31 U/L (ref 0–55)
AST (SGOT): 45 U/L — ABNORMAL HIGH (ref 5–34)
Albumin/Globulin Ratio: 1.5 (ref 0.9–2.2)
Albumin: 4.1 g/dL (ref 3.5–5.0)
Alkaline Phosphatase: 115 U/L — ABNORMAL HIGH (ref 37–106)
BUN: 14 mg/dL (ref 7.0–19.0)
Bilirubin, Total: 1.5 mg/dL — ABNORMAL HIGH (ref 0.2–1.2)
CO2: 27 mEq/L (ref 21–29)
Calcium: 9.5 mg/dL (ref 8.5–10.5)
Chloride: 107 mEq/L (ref 100–111)
Creatinine: 1 mg/dL (ref 0.4–1.5)
Globulin: 2.7 g/dL (ref 2.0–3.7)
Glucose: 86 mg/dL (ref 70–100)
Potassium: 4.1 mEq/L (ref 3.5–5.1)
Protein, Total: 6.8 g/dL (ref 6.0–8.3)
Sodium: 142 mEq/L (ref 136–145)

## 2018-01-20 LAB — CBC AND DIFFERENTIAL
Absolute NRBC: 0 10*3/uL (ref 0.00–0.00)
Basophils Absolute Automated: 0.04 10*3/uL (ref 0.00–0.08)
Basophils Automated: 1.3 %
Eosinophils Absolute Automated: 0.11 10*3/uL (ref 0.00–0.44)
Eosinophils Automated: 3.5 %
Hematocrit: 41 % (ref 34.7–43.7)
Hgb: 12.9 g/dL (ref 11.4–14.8)
Immature Granulocytes Absolute: 0.01 10*3/uL (ref 0.00–0.07)
Immature Granulocytes: 0.3 %
Lymphocytes Absolute Automated: 1.3 10*3/uL (ref 0.42–3.22)
Lymphocytes Automated: 41 %
MCH: 30.1 pg (ref 25.1–33.5)
MCHC: 31.5 g/dL (ref 31.5–35.8)
MCV: 95.8 fL (ref 78.0–96.0)
MPV: 13 fL — ABNORMAL HIGH (ref 8.9–12.5)
Monocytes Absolute Automated: 0.21 10*3/uL (ref 0.21–0.85)
Monocytes: 6.6 %
Neutrophils Absolute: 1.5 10*3/uL (ref 1.10–6.33)
Neutrophils: 47.3 %
Nucleated RBC: 0 /100 WBC (ref 0.0–0.0)
Platelets: 194 10*3/uL (ref 142–346)
RBC: 4.28 10*6/uL (ref 3.90–5.10)
RDW: 15 % (ref 11–15)
WBC: 3.17 10*3/uL (ref 3.10–9.50)

## 2018-01-20 LAB — HEMOGLOBIN A1C
Average Estimated Glucose: 88.2 mg/dL
Hemoglobin A1C: 4.7 % (ref 4.6–5.9)

## 2018-01-20 LAB — LIPID PANEL
Cholesterol / HDL Ratio: 2.7
Cholesterol: 102 mg/dL (ref 0–199)
HDL: 38 mg/dL — ABNORMAL LOW (ref 40–9999)
LDL Calculated: 57 mg/dL (ref 0–99)
Triglycerides: 37 mg/dL (ref 34–149)
VLDL Calculated: 7 mg/dL — ABNORMAL LOW (ref 10–40)

## 2018-01-20 LAB — VITAMIN B12: Vitamin B-12: 715 pg/mL (ref 211–911)

## 2018-01-20 LAB — HEMOLYSIS INDEX: Hemolysis Index: 21 — ABNORMAL HIGH (ref 0–18)

## 2018-01-20 LAB — TSH: TSH: 7.53 u[IU]/mL — ABNORMAL HIGH (ref 0.35–4.94)

## 2018-01-20 LAB — VITAMIN D,25 OH,TOTAL: Vitamin D, 25 OH, Total: 57 ng/mL (ref 30–100)

## 2018-01-20 LAB — GFR: EGFR: >60

## 2018-01-20 NOTE — Progress Notes (Signed)
Abnormal thyroid test, abnormal liver tests. To be discussed at scheduled visit 02/16/2018

## 2018-01-20 NOTE — Addendum Note (Signed)
Addended by: [place]. on: 01/20/2018 09:57 AM     Modules accepted: Orders

## 2018-01-20 NOTE — Telephone Encounter (Signed)
Order has been updated in the system

## 2018-01-20 NOTE — Pre-Procedure Instructions (Signed)
Anesthesia Review - Sleep Apnea

## 2018-01-20 NOTE — Progress Notes (Signed)
Patient came in today for EKG that was not done at the prior visit because patient needed to leave to go to another appointment. EKG reviewed by Dr. Thedore Mins, patient informed of normal result, and verbalized understanding.

## 2018-01-20 NOTE — Telephone Encounter (Signed)
Radiology dept is requesting order to be changed to [facility] Digital Diagnostic Bilateral W Cad.

## 2018-01-20 NOTE — Addendum Note (Signed)
Addended by: Harland German F. on: 01/20/2018 08:21 AM     Modules accepted: Orders

## 2018-01-20 NOTE — Telephone Encounter (Signed)
Okay thank you

## 2018-01-25 ENCOUNTER — Telehealth (INDEPENDENT_AMBULATORY_CARE_PROVIDER_SITE_OTHER): Payer: Self-pay | Admitting: Gastroenterology

## 2018-01-25 NOTE — Telephone Encounter (Signed)
I returned call to patient, went over the prep with her in detail.  She is scheduled for Wednesday morning.

## 2018-01-26 ENCOUNTER — Telehealth (INDEPENDENT_AMBULATORY_CARE_PROVIDER_SITE_OTHER): Payer: Self-pay

## 2018-01-26 ENCOUNTER — Telehealth: Payer: Self-pay | Admitting: Internal Medicine

## 2018-01-26 DIAGNOSIS — E890 Postprocedural hypothyroidism: Secondary | ICD-10-CM

## 2018-01-26 NOTE — Telephone Encounter (Signed)
Called pt, pt worried about wt loss, discussed that her thyroid results show TSH of 7, and that she is not hyperthyroid and that the thyroid would not explain her wt loss, she is having EGD, colonoscopy tomorrow.  Will continue Synthroid d for now, recheck in 6 wks.

## 2018-01-26 NOTE — Anesthesia Preprocedure Evaluation (Addendum)
Anesthesia Evaluation    AIRWAY    Mallampati: II    TM distance: >3 FB  Neck ROM: full  Mouth Opening:full  Planned to use difficult airway equipment: No CARDIOVASCULAR    regular and normal       DENTAL    no notable dental hx       PULMONARY    clear to auscultation     OTHER FINDINGS                  Relevant Problems   CARDIO   (+) Essential hypertension   (+) Migraine      GI   (+) Hiatal hernia      GU/RENAL   (+) Fatty liver      ENDO   (+) Postsurgical hypothyroidism               Anesthesia Plan    ASA 3     general                     intravenous induction   Detailed anesthesia plan: general IV            informed consent obtained    Plan discussed with CRNA.                   Signed by: Isidore Moos [facility] 01/26/18 2:20 PM

## 2018-01-26 NOTE — Telephone Encounter (Signed)
Spoke with pt regarding prep and timing of prep.

## 2018-01-26 NOTE — Telephone Encounter (Signed)
-----   Message from Pablo Ledger, LPN sent at 1/61/0960 10:33 AM EST -----  Regarding: FW: TSH from PCP  Contact: [phone]  Dr. Gifford Shave with patient who would like to discuss her TSH 7.53 results done at PCP last week. Patient also stated previous records not requested from her Endo. I did see notes where review and scanned to her file. Patient feels medications needs to be adjusted. Please review results in Epic dated 01/20/2018.     Thank you    ----- Message -----  From: Marta Lamas  Sent: 01/26/2018   9:53 AM EST  To: Pablo Ledger, LPN  Subject: labs                                             Pt said her lab numbers are high, she said she had lab work done last week and it shows on her mychart and would like to talk to Dr about it because she has a procedure tomorrow

## 2018-01-27 ENCOUNTER — Ambulatory Visit
Admission: RE | Admit: 2018-01-27 | Discharge: 2018-01-27 | Disposition: A | Discharge status: Home / Self Care | Payer: 59 | Source: Ambulatory Visit | Attending: Gastroenterology | Admitting: Gastroenterology

## 2018-01-27 ENCOUNTER — Ambulatory Visit: Payer: 59 | Admitting: Certified Registered"

## 2018-01-27 ENCOUNTER — Ambulatory Visit: Payer: Self-pay

## 2018-01-27 ENCOUNTER — Telehealth (INDEPENDENT_AMBULATORY_CARE_PROVIDER_SITE_OTHER): Payer: Self-pay | Admitting: Gastroenterology

## 2018-01-27 ENCOUNTER — Encounter
Admission: RE | Disposition: A | Discharge status: Home / Self Care | Payer: Self-pay | Source: Ambulatory Visit | Attending: Gastroenterology

## 2018-01-27 DIAGNOSIS — K3189 Other diseases of stomach and duodenum: Secondary | ICD-10-CM | POA: Insufficient documentation

## 2018-01-27 DIAGNOSIS — Z9884 Bariatric surgery status: Secondary | ICD-10-CM | POA: Insufficient documentation

## 2018-01-27 DIAGNOSIS — K7460 Unspecified cirrhosis of liver: Secondary | ICD-10-CM | POA: Insufficient documentation

## 2018-01-27 DIAGNOSIS — G4730 Sleep apnea, unspecified: Secondary | ICD-10-CM | POA: Insufficient documentation

## 2018-01-27 DIAGNOSIS — D122 Benign neoplasm of ascending colon: Secondary | ICD-10-CM

## 2018-01-27 DIAGNOSIS — R634 Abnormal weight loss: Secondary | ICD-10-CM

## 2018-01-27 DIAGNOSIS — Z8249 Family history of ischemic heart disease and other diseases of the circulatory system: Secondary | ICD-10-CM | POA: Insufficient documentation

## 2018-01-27 DIAGNOSIS — R1084 Generalized abdominal pain: Secondary | ICD-10-CM

## 2018-01-27 DIAGNOSIS — K529 Noninfective gastroenteritis and colitis, unspecified: Secondary | ICD-10-CM | POA: Insufficient documentation

## 2018-01-27 DIAGNOSIS — K219 Gastro-esophageal reflux disease without esophagitis: Secondary | ICD-10-CM | POA: Insufficient documentation

## 2018-01-27 DIAGNOSIS — D649 Anemia, unspecified: Secondary | ICD-10-CM | POA: Insufficient documentation

## 2018-01-27 DIAGNOSIS — K2950 Unspecified chronic gastritis without bleeding: Secondary | ICD-10-CM

## 2018-01-27 DIAGNOSIS — E890 Postprocedural hypothyroidism: Secondary | ICD-10-CM | POA: Insufficient documentation

## 2018-01-27 DIAGNOSIS — R948 Abnormal results of function studies of other organs and systems: Secondary | ICD-10-CM | POA: Insufficient documentation

## 2018-01-27 DIAGNOSIS — R197 Diarrhea, unspecified: Secondary | ICD-10-CM

## 2018-01-27 DIAGNOSIS — D126 Benign neoplasm of colon, unspecified: Secondary | ICD-10-CM | POA: Insufficient documentation

## 2018-01-27 DIAGNOSIS — D120 Benign neoplasm of cecum: Secondary | ICD-10-CM | POA: Insufficient documentation

## 2018-01-27 HISTORY — DX: Diaphragmatic hernia without obstruction or gangrene: K44.9

## 2018-01-27 HISTORY — DX: Unspecified cirrhosis of liver: K74.60

## 2018-01-27 HISTORY — DX: Anemia, unspecified: D64.9

## 2018-01-27 HISTORY — DX: Unspecified osteoarthritis, unspecified site: M19.90

## 2018-01-27 SURGERY — ESOPHAGOGASTRODUODENOSCOPY (EGD), BIOPSY
Anesthesia: Anesthesia General | Site: Mouth | Wound class: Clean Contaminated

## 2018-01-27 MED ORDER — LIDOCAINE HCL 2 % IJ SOLN
INTRAMUSCULAR | Status: DC | PRN
Start: 2018-01-27 — End: 2018-01-27
  Administered 2018-01-27 (×2): 40 mg via INTRAVENOUS
  Administered 2018-01-27: 60 mg via INTRAVENOUS

## 2018-01-27 MED ORDER — GLYCOPYRROLATE 0.2 MG/ML IJ SOLN
INTRAMUSCULAR | Status: AC
Start: 2018-01-27 — End: ?
  Filled 2018-01-27: qty 1

## 2018-01-27 MED ORDER — PROPOFOL 10 MG/ML IV EMUL (WRAP)
INTRAVENOUS | Status: AC
Start: 2018-01-27 — End: ?
  Filled 2018-01-27: qty 20

## 2018-01-27 MED ORDER — LACTATED RINGERS IV SOLN
INTRAVENOUS | Status: DC
Start: 2018-01-27 — End: 2018-01-27

## 2018-01-27 MED ORDER — FENTANYL CITRATE (PF) 50 MCG/ML IJ SOLN (WRAP)
INTRAMUSCULAR | Status: AC
Start: 2018-01-27 — End: ?
  Filled 2018-01-27: qty 2

## 2018-01-27 MED ORDER — LIDOCAINE HCL (PF) 2 % IJ SOLN
INTRAMUSCULAR | Status: AC
Start: 2018-01-27 — End: ?
  Filled 2018-01-27: qty 5

## 2018-01-27 MED ORDER — PROPOFOL INFUSION 10 MG/ML
INTRAVENOUS | Status: DC | PRN
Start: 2018-01-27 — End: 2018-01-27
  Administered 2018-01-27 (×2): 50 mg via INTRAVENOUS
  Administered 2018-01-27 (×2): 30 mg via INTRAVENOUS

## 2018-01-27 MED ORDER — GLYCOPYRROLATE 0.2 MG/ML IJ SOLN
INTRAMUSCULAR | Status: DC | PRN
Start: 2018-01-27 — End: 2018-01-27
  Administered 2018-01-27: 0.2 mg via INTRAVENOUS

## 2018-01-27 MED ORDER — FENTANYL CITRATE (PF) 50 MCG/ML IJ SOLN (WRAP)
INTRAMUSCULAR | Status: DC | PRN
Start: 2018-01-27 — End: 2018-01-27
  Administered 2018-01-27 (×2): 50 ug via INTRAVENOUS

## 2018-01-27 SURGICAL SUPPLY — 117 items
BALLOON FEEDING 24FR X 4.4CM (Endoscopic Supplies) IMPLANT
BELT RB FM UNV 40X6IN LF NS SLD PNL ELC (Patient Supply)
BELT RIB UNIVERSAL L40 IN X W6 IN FOAM DEROYAL SOLID PANEL ELASTIC (Patient Supply) IMPLANT
BIOVAC DRCT SUCT DVC (Suction) IMPLANT
BLOCK BITE MAXI 60FR LF STRD STRAP SDPRT (Procedure Accessories) ×1
BLOCK BITE OD60 FR STURDY STRAP SIDEPORT (Procedure Accessories) ×2
BLOCK BITE OD60 FR STURDY STRAP SIDEPORT DENTAL RETENTION RIM MAXI (Procedure Accessories) ×2 IMPLANT
BRUSH CYTO CNMD 3MM 2.1MM 230CM LF STRL (Brushes)
BRUSH CYTOLOGY 230CM SHTH BRSTL PSTV RING 3MM 2.1MM CONMED COLONOSCOPE (Brushes) IMPLANT
BRUSH CYTOLOGY L230 CM SHEATH BRISTLE (Brushes)
CATHETER BALLOON DILATATION CRE 2.8 MM (Balloons)
CATHETER BALLOON DILATATION CRE PEBAX (Balloons)
CATHETER BALLOON DILATATION L5.5 CM L240 (Balloons)
CATHETER BLNDIL PEBAX CRE 7.5FR 12-15MM (Balloons)
CATHETER BLNDIL PEBAX CRE 7.5FR 15-18MM (Balloons)
CATHETER ELHMST HMGLD GLDPRB 7FR 300CM (Procedure Accessories)
CATHETER OD10-11-12 MM ODSEC6 FR L180 CM CRE BALLOON DILATATION L8 CM (Balloons) IMPLANT
CATHETER OD10-11-12 MM ODSEC6 FR L180 CM CREâ„¢ BALLOON DILATATION L8 CM (Balloons) IMPLANT
CATHETER OD15-16.5-18 MM ODSEC6 FR L180 CM CRE BALLOON DILATATION L8 (Balloons) IMPLANT
CATHETER OD15-16.5-18 MM ODSEC6 FR L180 CM CREâ„¢ BALLOON DILATATION L8 (Balloons) IMPLANT
CATHETER OD6 FR ODSEC12-13.5-15 MM L180 CM CRE BALLOON DILATATION L8 (Balloons) IMPLANT
CATHETER OD6 FR ODSEC12-13.5-15 MM L180 CM CREâ„¢ BALLOON DILATATION L8 (Balloons) IMPLANT
CATHETER OD7 FR L300 CM BIPOLAR ROUND (Procedure Accessories)
CATHETER OD7 FR L300 CM BIPOLAR ROUND DISTAL TIP STANDARD CONNECTOR (Procedure Accessories) IMPLANT
CATHETER OD7.5 FR ODSEC12-15 MM L240 CM CRE BALLOON DILATATION L5.5 (Balloons) IMPLANT
CATHETER OD7.5 FR ODSEC12-15 MM L240 CM CREâ„¢ BALLOON DILATATION L5.5 (Balloons) IMPLANT
CATHETER OD7.5 FR ODSEC15-18 MM L240 CM CRE BALLOON DILATATION L5.5 (Balloons) IMPLANT
CATHETER OD7.5 FR ODSEC15-18 MM L240 CM CREâ„¢ BALLOON DILATATION L5.5 (Balloons) IMPLANT
CATHETER OD8-9-10 MM ODSEC7.5 FR L240 CM CRE BALLOON DILATATION L5.5 (Balloons) IMPLANT
CATHETER OD8-9-10 MM ODSEC7.5 FR L240 CM CREâ„¢ BALLOON DILATATION L5.5 (Balloons) IMPLANT
DEVICE MEASURING OTW MEASURE MIC-KEY* (Prep) IMPLANT
DEVICE MSR MCKY LF STRL OTW MSR DISP (Prep)
DILATOR ENDOSCOPIC CRE 2.8 MM 3.2 MM (Balloons)
DILATOR ENDOSCOPIC CRE 2.8 MM 3.2 MM PEBAX ESOPHAGEAL PYLORIC COLONIC (Balloons) IMPLANT
DILATOR ENDOSCOPIC CRE 5.5C 240CM 6-7-8MM 7.5FR PEBAX ESOPHAGEAL (Balloons) IMPLANT
DILATOR ENDOSCOPIC CRE PEBAX ESOPHAGEAL (Balloons)
DILATOR ESCP PEBAX 2.8MM 3.2MM CRE (Balloons)
DILATOR ESCP PEBAX 2.8MM 3.2MM CRE 6-7-8 (Balloons)
DILATOR ESCP PEBAX 2.8MM CRE 10-11-12MM (Balloons)
DILATOR ESCP PEBAX 2.8MM CRE 15-16.5-18 (Balloons)
DILATOR ESCP PEBAX CRE 6FR 12-13.5-15MM (Balloons)
DILATOR ESCP PEBAX CRE 8-9-10MM 7.5FR (Balloons)
ELECTRODE ADULT PATIENT RETURN L9 FT REM POLYHESIVE ACRYLIC FOAM (Procedure Accessories) IMPLANT
ELECTRODE PATIENT RETURN L9 FT VALLEYLAB (Procedure Accessories)
ELECTRODE PT RTN RM PHSV ACRL FM C30- LB (Procedure Accessories)
FORCEPS BIOPSY L240 CM +2.8 MM HOT OD2.2 (Endoscopic Supplies)
FORCEPS BIOPSY L240 CM +2.8 MM HOT OD2.2 MM RADIAL JAW (Endoscopic Supplies) IMPLANT
FORCEPS BIOPSY L240 CM JUMBO MICROMESH (Instrument)
FORCEPS BIOPSY L240 CM JUMBO MICROMESH TEETH STREAMLINE CATHETER (Instrument) IMPLANT
FORCEPS BIOPSY L240 CM LARGE CAPACITY (Instrument) ×2
FORCEPS BIOPSY L240 CM MICROMESH TEETH STREAMLINE CATHETER NEEDLE (Instrument) IMPLANT
FORCEPS BIOPSY L240 CM STANDARD CAPACITY (Instrument)
FORCEPS BX +2.8MM RJ 4 2.2MM 240CM HOT (Endoscopic Supplies)
FORCEPS BX SS JMB RJ 4 2.8MM 240CM STRL (Instrument)
FORCEPS BX SS LG CPC RJ 4 2.4MM 240CM (Instrument) ×1
FORCEPS BX STD CPC RJ 4 2.2MM 240CM STRL (Instrument)
GLOVE EXAM LARGE NITRILE CHEMOTHERAPY POWDER FREE SENSE OATMEAL (Glove) ×2 IMPLANT
GLOVE EXAM LARGE NITRILE POWDER FREE SENSE OATMEAL (Glove) ×2 IMPLANT
GLOVE EXAM NITRILE RESTORE LG (Glove) ×2
GLV EXAM NITRILE RESTORE LG (Glove) ×3
GOWN CHEMOTHERAPY L47 IN X W28 IN UNIVERSAL OPEN BACK OVERHEAD KNIT (Gown) ×4 IMPLANT
GOWN CHMO PP PE UNV 47X28IN LF OPN BCK (Gown) ×6
GUIDEWIRE ENDOSCOPIC L200 CM (Guidwire)
GUIDEWIRE ENDOSCOPIC SAVARY-GILLIARD L200 CM FLEXIBLE TIP STAINLESS (Guidwire) IMPLANT
GUIDEWIRE URO SS S-G 200CM NS FLX TP (Guidwire)
HANDLE INFL ALN II REUSE (Balloon)
HANDLE INFLATION ALLIANCE II PULMONARY (Balloon) IMPLANT
HANDLE INFLATION CRE (Balloon)
JELLY LUB EZ LF STRL H2O SOL NGRS TRNLU (Irrigation Solutions) ×3 IMPLANT
KIT UNIVERSAL IRRIGATION SOL (Kits) ×3 IMPLANT
LIGATOR DEVICE ENDOSCOPIC (Disposable Instruments)
LIGATOR ESCP LF STRL DEV DISP (Disposable Instruments)
LIGATOR POLYLOOP DEVICE ENDOSCOPIC (Disposable Instruments) IMPLANT
MARKER ENDOSCOPIC PERMANENT INDICATION (Syringes, Needles)
MARKER ENDOSCOPIC PERMANENT INDICATION DARK SYRINGE SPOT EX 5 ML (Syringes, Needles) IMPLANT
MARKER ESCP 5ML SPOT EX PERM INDCT DRK (Syringes, Needles)
MASK FACE FM FLDSHLD LF LVL 3 TIE EYSHLD (Personal Protection) IMPLANT
NEEDLE SCLEROTHERAPY CARR-LOCKE OD25 GA ODSEC2.5 MM L230 CM INJECTION (Needles) IMPLANT
NEEDLE SCLEROTHERAPY OD25 GA ODSEC2.5 MM (Needles)
NEEDLE SCLRTX SS TFLN CRLK 25GA 2.5MM (Needles)
NET ROTH FOREIGN BODY MAXI GI (Disposable Instruments) IMPLANT
NET ROTH FOREIN BODY STNDRD GI (Disposable Instruments) IMPLANT
NET SPEC RTRVL STD RTHNT 2.5MM 230CM LF (Urology Supply)
NET SPEC RTRVL UNV RTHNT PLTN 2.5MM 230 (GE Lab Supplies) IMPLANT
NET SPECIMEN RETRIEVAL L230 CM STANDARD (Urology Supply)
NET SPECIMEN RETRIEVAL L230 CM STANDARD SHEATH OD2.5 MM L6 CM X W3 CM (Urology Supply) IMPLANT
OVERTUBE ENDOSCOPIC L25 CM STANDARD (Endoscopic Supplies)
OVERTUBE ENDOSCOPIC L25 CM STANDARD TAPER INSUFFLATION CAP OD19.5 MM (Endoscopic Supplies) IMPLANT
OVERTUBE ESCP STD TPR GUARDUS 19.5MM (Endoscopic Supplies)
PROBE COAG FIAPC 6.9FR 7.2FT CRCMF PLG (Endoscopic Supplies)
PROBE COAGULATION L7.2 FT (Endoscopic Supplies)
PROBE COAGULATION L7.2 FT CIRCUMFERENTIAL PLUG PLAY FUNCTIONALITY (Endoscopic Supplies) IMPLANT
PROBE ELECTROSURGICAL L220 CM FLEXIBLE (Procedure Accessories)
PROBE ELECTROSURGICAL L220 CM FLEXIBLE STRAIGHT FIRE OD2.3 MM FIAPC (Procedure Accessories) IMPLANT
PROBE ESURG FIAPC 2.3MM 220CM STRL FLXB (Procedure Accessories)
SNARE ESCP MED OVL SNS 2.4MM 240CM STRL (Procedure Accessories)
SNARE ESCP SPRL SNAREMASTER 2.8MM 20MM (Procedure Accessories)
SNARE MEDIUM OVAL L240 CM OD2.4 MM (Procedure Accessories)
SNARE MEDIUM OVAL L240 CM OD2.4 MM SENSATION LOOP SHORT THROW FLEXIBLE (Procedure Accessories) IMPLANT
SNARE SPIRAL 230CM 2.8MM 20MM SNRMSTR RDG WIRE ENDOSCOPIC POLYPECTOMY (Procedure Accessories) IMPLANT
SNARE SPIRAL L230 CM OD2.8 MM ODSEC20 MM (Procedure Accessories)
SPONGE GAUZE L4 IN X W4 IN 16 PLY (Dressing) ×2
SPONGE GAUZE L4 IN X W4 IN 16 PLY MAXIMUM ABSORBENT USP TYPE VII (Dressing) ×2 IMPLANT
SPONGE GZE CTTN CRTY 4X4IN LF NS 16 PLY (Dressing) ×1
SYRINGE 50 ML GRADUATE NONPYROGENIC DEHP (Syringes, Needles) ×2
SYRINGE 50 ML GRADUATE NONPYROGENIC DEHP FREE PVC FREE BD MEDICAL (Syringes, Needles) ×2 IMPLANT
SYRINGE INFL 60ML ALN II STRL GA DISP (Syringes, Needles)
SYRINGE INFLATION 60 ML GAUGE CRE (Syringes, Needles) IMPLANT
SYRINGE MED 50ML LF STRL GRAD N-PYRG (Syringes, Needles) ×1
TRAP MCS PLS LF STRL SCR CAP TUBE ID LBL (Procedure Accessories)
TRAP MUCUS SCREW CAP TUBE ID LABEL (Procedure Accessories)
TRAP MUCUS SCREW CAP TUBE ID LABEL MEDLINE PLASTIC CLEAR (Procedure Accessories) IMPLANT
WATER STERILE PLASTIC POUR BOTTLE 1000 (Irrigation Solutions) ×2
WATER STERILE PLASTIC POUR BOTTLE 1000 ML (Irrigation Solutions) ×2 IMPLANT
WATER STERILE PLASTIC POUR BOTTLE 250 ML (Irrigation Solutions) ×2 IMPLANT
WATER STRL 1000ML PLS PR BTL LF (Irrigation Solutions) ×1
WATER STRL 250ML LF PLS PR BTL (Irrigation Solutions) ×1

## 2018-01-27 NOTE — Anesthesia Postprocedure Evaluation (Signed)
Anesthesia Post Evaluation    Patient: Tracy [name]    Procedure(s):  EGD, BIOPSY  COLONOSCOPY, BIOPSY    Anesthesia type: general    Anesthesia Post Evaluation:     Patient Evaluated: PACU    Level of Consciousness: awake and alert    Pain Management: adequate    Airway Patency: patent    Anesthetic complications: No      PONV Status: none    Cardiovascular status: acceptable  Respiratory status: acceptable  Hydration status: acceptable        Signed by: Dickey Gave, 01/27/2018 1:33 PM

## 2018-01-27 NOTE — Discharge Instr - AVS First Page (Signed)
Reason for your Hospital Admission:  ***      Instructions for after your discharge:  ***    Endoscopy Discharge Instructions  General Instructions:  1. Following sedation, your judgement, perception, and coordination are considered impaired. Even though you may feel awake and alert, you are considered legally intoxicated. Therefore, until the next morning;   Do not Drive   Do not operate appliances or equipment that requires reaction time (e.g. Stove, electrical tools, machinery)   Do not sign legal documents or be involved in important decisions.   Do not smoke if alone   Do not drink alcoholic beverages   Go directly home and rest for several hours before resuming your routine activities.   It is highly recommended to have a responsible adult stay with you for the next 24 hours    2. Tenderness, swelling or pain may occur at the IV site where you received sedation. If you experience this, apply warm soaks to the area. Notify your physician if this persists.    Report To Physician Any Of The Following:   1. Severe and persistent abdominal pain/bloating which does not subside within 2-3 hours  2. Large amount of rectal bleeding (some mucosal blood streaking may occur, especially if biopsy or polypectomy was done or if hemorrhoids are present.  3. Nausea/vomitting  4. Fevers/Chills within 24 hours after procedure. Temp>101deg F    In Addition:  If polyp has been removed, DO NOT take aspirin or aspirin containing products (e.g. Anacin, Alka Seltzer, Bufferin, Etc.) or non-steroidal anti-inflammatory drugs (e.g. Advil, Motrin, etc.) for7 days unless otherwise advised by doctor. Tylenol or extra Strength Tylenol is permitted.    Additional Discharge Instructions  Your diet after the procedure: No restriction.    If you have questions or problems contact your MD immediately. If you need immediate attention, call your MD, 911 and/or go to nearest emergency room.

## 2018-01-27 NOTE — Telephone Encounter (Signed)
Done

## 2018-01-27 NOTE — Telephone Encounter (Signed)
Please  put patient on Recall CRC Surveillance list to have a follow up colonoscopy in 5 years.

## 2018-01-27 NOTE — Progress Notes (Signed)
Fall Precautions interventions include the following due to sedation/anesthesia for procedures:    -Physical environment is free of clutter  -The patient is oriented to the environment  -Family members may be at the bedside when appropriate  -Stretchers are in the lowest position with wheels locked  -Non-skid socks are provided as foot covers  -Staff members assist the patient with ambulation to the bathroom  -Call bell in reach if staff member is away

## 2018-01-27 NOTE — Transfer of Care (Signed)
Anesthesia Transfer of Care Note    Patient: Tracy [name]    Procedures performed: Procedure(s):  EGD, BIOPSY  COLONOSCOPY, BIOPSY    Anesthesia type: General TIVA    Patient location:Phase I PACU    Last vitals:   Vitals:    01/27/18 0852   BP: (!) 177/95   Pulse: (!) 45   Resp: 16   Temp: 36.6 C (97.9 F)   SpO2: 99%       Post pain: Patient not complaining of pain, continue current therapy      Mental Status:awake    Respiratory Function: tolerating room air    Cardiovascular: stable    Nausea/Vomiting: patient not complaining of nausea or vomiting    Hydration Status: adequate    Post assessment: no apparent anesthetic complications    Signed by: Willa Rough  01/27/18 9:57 AM

## 2018-01-27 NOTE — H&P (Signed)
Adreanne Yono Lemar Lofty, M.D.  [facility]  [address]  [place], [place].  [address]  [phone]  [phone]        Date Time: 01/27/2018 9:09 AM  Patient Name: Tracy [name]  Referring Physician: Harland German, MD    Procedure:   EGD and colonoscopy    History:   Tracy [name] is a 53 y.o. female who presents to the office for outpatient EGD colonoscopy for history of weight loss/ileitis and abnormal CT scan.      Past Medical History:     Past Medical History:   Diagnosis Date   . Anemia    . Arthritis    . Carotid stenosis    . Fatty liver 2017   . GERD (gastroesophageal reflux disease)    . Hepatic cirrhosis    . Hiatal hernia    . Sleep apnea     not using CPAP machine   . Vitamin D deficiency        Past Surgical History:     Past Surgical History:   Procedure Laterality Date   . CHOLECYSTECTOMY  12/2016   . LAPAROSCOPIC REVISION SLEEVE GASTRECTOMY TO GASTRIC BYPASS     . LAPAROSCOPIC, GASTRECTOMY SLEEVE     . SALPINGO OOPHERECTOMY Bilateral    . THYROIDECTOMY  2013       Family History:     Family History   Problem Relation Age of Onset   . Cancer Mother    . Diabetes Mother    . Heart disease Mother    . Heart attack Mother    . Hypertension Mother    . Inflammatory bowel disease Mother    . Cancer Father    . Asthma Father    . Diabetes Father    . Hyperlipidemia Father    . Hypertension Father    . Asthma Sister    . Diabetes Sister    . Hyperlipidemia Sister    . Hypertension Sister    . Cancer Brother    . Hyperlipidemia Brother    . Hypertension Brother    . Breast cancer Paternal Aunt    . Breast cancer Other        Social History:     Social History     Socioeconomic History   . Marital status: Significant Other     Spouse name: Not on file   . Number of children: Not on file   . Years of education: Not on file   . Highest education level: Not on file   Occupational History   . Not on file   Social Needs   . Financial resource strain: Not on file   .  Food insecurity:     Worry: Not on file     Inability: Not on file   . Transportation needs:     Medical: Not on file     Non-medical: Not on file   Tobacco Use   . Smoking status: Never Smoker   . Smokeless tobacco: Never Used   Substance and Sexual Activity   . Alcohol use: No   . Drug use: No   . Sexual activity: Not on file   Lifestyle   . Physical activity:     Days per week: Not on file     Minutes per session: Not on file   . Stress: Not on file   Relationships   . Social connections:     Talks on phone: Not  on file     Gets together: Not on file     Attends religious service: Not on file     Active member of club or organization: Not on file     Attends meetings of clubs or organizations: Not on file     Relationship status: Not on file   . Intimate partner violence:     Fear of current or ex partner: Not on file     Emotionally abused: Not on file     Physically abused: Not on file     Forced sexual activity: Not on file   Other Topics Concern   . Not on file   Social History Narrative   . Not on file       Allergies:     Allergies   Allergen Reactions   . Bee Pollen Swelling     Other reaction(s): Cough, Cough (ALLERGY/intolerance)  Stuffy nose  Stuffy nose  "Bee Stings".Marland KitchenMarland KitchenMarland KitchenStuffy nose; "throat swelling"     . Vicodin [Hydrocodone-Acetaminophen] Nausea And Vomiting       Medications:     Current Facility-Administered Medications:   .  lactated ringers infusion, , Intravenous, Continuous, Mohindra, Rachna, MD, Last Rate: 20 mL/hr at 01/27/18 0981    Review of Systems:   Constitutional:No fever,chill  Respiratory:No wheezing.  Cardiovascular:No chest pain.  Gastrointestinal:See HPI.  Neurologic:No slurred speech, hemiplegia or focal deficit.  Hematologic:No easy bruising.    Physical Exam:     Vitals:    01/20/18 1518 01/27/18 0852   BP:  (!) 177/95   Pulse:  (!) 45   Resp:  16   Temp:  97.9 F (36.6 C)   TempSrc:  Temporal   SpO2:  99%   Weight: 65.8 kg (145 lb) 65.3 kg (144 lb)   Height: 1.588 m (5' 2.5")  1.588 m (5' 2.5")     General appearance - Well developed and well nourished, no acute distress.  HEENT- Atraumatic, normocephalic. Anicteric sclera,  Nares normal.  No oral lesions.   Neck - Supple.    Chest - Lungs no wheezing.  Heart - regular rate and rhythm.  Abdomen - Soft, not distended, not tender, no hepatosplenomegaly no masses, bowel sounds are normoactive, no ascites.  Neurologic - Alert and oriented times 3.      Assessment:   1. Abnormal CT scan  2. Ileitis  3. Weight loss    Plan:   1. EGD and colonoscopy    Signed by: Shanon Brow, MD    All recent labs and radiologic studies were reviewed at the time of the visit. Thank you for allowing me to participate in the care of your patient.

## 2018-01-28 ENCOUNTER — Encounter: Payer: Self-pay | Admitting: Gastroenterology

## 2018-01-28 ENCOUNTER — Encounter (INDEPENDENT_AMBULATORY_CARE_PROVIDER_SITE_OTHER): Payer: Self-pay | Admitting: Family Medicine

## 2018-01-28 LAB — LAB USE ONLY - HISTORICAL SURGICAL PATHOLOGY

## 2018-02-02 ENCOUNTER — Encounter (INDEPENDENT_AMBULATORY_CARE_PROVIDER_SITE_OTHER): Payer: Self-pay

## 2018-02-02 NOTE — Telephone Encounter (Signed)
FYI, can you check and see what patient needs done before his 02/16/18 appointment?

## 2018-02-03 ENCOUNTER — Telehealth (INDEPENDENT_AMBULATORY_CARE_PROVIDER_SITE_OTHER): Payer: Self-pay | Admitting: Gastroenterology

## 2018-02-03 NOTE — Telephone Encounter (Signed)
Pt had endoscopy 1/22 but unable to get through to GI to schedule follow-up.  Called GI and conferenced call so pt could set up f/u. Appt made for 1st available which is 03/04/2018

## 2018-02-03 NOTE — Telephone Encounter (Signed)
Thank you so much

## 2018-02-03 NOTE — Telephone Encounter (Signed)
It is ok till then.

## 2018-02-03 NOTE — Progress Notes (Signed)
Pt has fu scheduled  °

## 2018-02-03 NOTE — Telephone Encounter (Signed)
Scheduled Pt for 03-04-18. Pt wants to know if this is ok for results. If not please call

## 2018-02-09 ENCOUNTER — Other Ambulatory Visit (INDEPENDENT_AMBULATORY_CARE_PROVIDER_SITE_OTHER): Payer: Self-pay | Admitting: Family Medicine

## 2018-02-16 ENCOUNTER — Ambulatory Visit (INDEPENDENT_AMBULATORY_CARE_PROVIDER_SITE_OTHER): Payer: 59 | Admitting: Family Medicine

## 2018-02-16 ENCOUNTER — Encounter (INDEPENDENT_AMBULATORY_CARE_PROVIDER_SITE_OTHER): Payer: Self-pay | Admitting: Family Medicine

## 2018-02-16 VITALS — BP 118/68 | HR 59 | Temp 97.9°F | Resp 16 | Ht 62.25 in | Wt 143.6 lb

## 2018-02-16 DIAGNOSIS — J309 Allergic rhinitis, unspecified: Secondary | ICD-10-CM

## 2018-02-16 DIAGNOSIS — E890 Postprocedural hypothyroidism: Secondary | ICD-10-CM

## 2018-02-16 DIAGNOSIS — E876 Hypokalemia: Secondary | ICD-10-CM

## 2018-02-16 DIAGNOSIS — Z8679 Personal history of other diseases of the circulatory system: Secondary | ICD-10-CM

## 2018-02-16 DIAGNOSIS — K760 Fatty (change of) liver, not elsewhere classified: Secondary | ICD-10-CM

## 2018-02-16 DIAGNOSIS — Z9884 Bariatric surgery status: Secondary | ICD-10-CM

## 2018-02-16 MED ORDER — BECLOMETHASONE DIPROPIONATE 80 MCG/ACT NA AERS
2.00 | INHALATION_SPRAY | Freq: Every day | NASAL | 1 refills | Status: DC
Start: 2018-02-16 — End: 2018-11-05

## 2018-02-16 NOTE — Progress Notes (Signed)
Subjective:       Patient ID: Tracy [name] is a 53 y.o. female.    HPI  Here to establish care for multiple medical problems. Former PCP Dr. Jonetta Osgood.    Problem   Postsurgical Hypothyroidism    Compliant with Synthroid daily, followed by Endo.  Postsurgical for goiter. C/o hair loss, fatigue  Lab Results   Component Value Date    TSH 7.53 (H) 01/20/2018   Hypothyroid but per pt was advised to remain on same medication. Followed by Endo.     S/P Gastric Bypass      Lab Results   Component Value Date    WBC 3.17 01/20/2018    HGB 12.9 01/20/2018    HCT 41.0 01/20/2018    MCV 95.8 01/20/2018    PLT 194 01/20/2018     Lab Results   Component Value Date    CREAT 1.0 01/20/2018    BUN 14.0 01/20/2018    NA 142 01/20/2018    K 4.1 01/20/2018    CL 107 01/20/2018    CO2 27 01/20/2018     Lab Results   Component Value Date    B12 715 01/20/2018     Lab Results   Component Value Date    VITD 57 01/20/2018     Lab Results   Component Value Date    CA 9.5 01/20/2018            History of Hypertension      BP Readings from Last 3 Encounters:   02/16/18 118/68   01/27/18 (!) 165/94   01/20/18 146/90          Hypokalemia      Lab Results   Component Value Date    CREAT 1.0 01/20/2018    BUN 14.0 01/20/2018    NA 142 01/20/2018    K 4.1 01/20/2018    CL 107 01/20/2018    CO2 27 01/20/2018          Fatty Liver    CT abdomen 08/2017 showed mild fatty infiltration.  Lab Results   Component Value Date    ALT 31 01/20/2018    AST 45 (H) 01/20/2018    ALKPHOS 115 (H) 01/20/2018    BILITOTAL 1.5 (H) 01/20/2018          Allergic Rhinitis    C/o nasal congestion to pollen.  Relieved with Qnsal. Flonase, Nasonex ineffective.         The following portions of the patient's history were reviewed and updated as appropriate: allergies, current medications, past family history, past medical history, past social history, past surgical history and problem list.    Review of Systems   Constitutional: Negative for chills, fatigue and  fever.   Respiratory: Negative for chest tightness and shortness of breath.    Cardiovascular: Negative for chest pain, palpitations and leg swelling.   Endocrine: Negative for cold intolerance and heat intolerance.   Skin: Negative for pallor.           Objective:    Physical Exam  Vitals signs reviewed.   Constitutional:       General: She is not in acute distress.     Appearance: She is not ill-appearing.   Cardiovascular:      Rate and Rhythm: Normal rate and regular rhythm.      Heart sounds: Normal heart sounds. No murmur.   Pulmonary:      Effort: Pulmonary effort is normal. No respiratory distress.  Breath sounds: No wheezing or rales.   Skin:     General: Skin is warm.   Neurological:      Mental Status: She is alert and oriented to person, place, and time.   Psychiatric:         Mood and Affect: Mood normal.         Behavior: Behavior normal.         Thought Content: Thought content normal.         Judgment: Judgment normal.       BP 118/68   Pulse (!) 59   Temp 97.9 F (36.6 C) (Oral)   Resp 16   Ht 1.581 m (5' 2.25")   Wt 65.1 kg (143 lb 9.6 oz)   BMI 26.05 kg/m       Assessment:       1. Postsurgical hypothyroidism  Ambulatory referral to Endocrinology   2. Hypokalemia     3. Fatty liver     4. S/P gastric bypass     5. Allergic rhinitis, unspecified seasonality, unspecified trigger  Beclomethasone Dipropionate (QNASL) 80 MCG/ACT Aero Soln   6. History of hypertension           Plan:      Procedures  Orders Placed This Encounter   Procedures   . Ambulatory referral to Endocrinology     Referral Priority:   Routine     Referral Type:   Consultation     Referral Reason:   Specialty Services Required     Referred to Provider:   Marcellina Millin, MD     Requested Specialty:   Endocrinology, Diabetes and Metabolism     Number of Visits Requested:   12     Medications Ordered This Encounter       Disp Refills Start End    Beclomethasone Dipropionate (QNASL) 80 MCG/ACT Aero Soln 3 Inhaler 1 02/16/2018      2 sprays by Nasal route daily - Nasal        1. Postsurgical hypothyroidism  - Currently hypothyroid and symptomatic. Discussed about the need to adjust her Synthroid but declined at this time. She will ffup with her endocrinologistMultiple questions answered.  - Ambulatory referral to Endocrinology    2. Hypokalemia  - Resolved.    3. Fatty liver  - Discussed diagnosis, treatment. Low carb diet discussed. Multiple questions answered.    4. S/P gastric bypass  - Reviewed prior lab results. No vitamin deficiencies. Continue current vitamins.  - Discussed that this may be reason why she is needing higher doses of Synthroid, due to malabsorption.    5. Allergic rhinitis, unspecified seasonality, unspecified trigger  - Relieved with Qnsal. Continue, refills provided.      RTC 2 months for annual physical with pap     Total visit time 40, of which 30 minutes spent on face-to-face counseling about the nature of the problem and treatment options, and coordination of care of multiple medical issues described in the assessment and plan, answering questions and addressing concerns.

## 2018-02-16 NOTE — Progress Notes (Signed)
Have you seen any specialists/other providers since your last visit with us?    No    Arm preference verified?   Yes    The patient is due for pap smear, influenza vaccine, shingles vaccine and [facility] care plan letter\

## 2018-03-02 ENCOUNTER — Ambulatory Visit
Admission: RE | Admit: 2018-03-02 | Discharge: 2018-03-02 | Disposition: A | Discharge status: Home / Self Care | Payer: 59 | Source: Ambulatory Visit | Attending: "Endocrinology | Admitting: "Endocrinology

## 2018-03-02 ENCOUNTER — Encounter (INDEPENDENT_AMBULATORY_CARE_PROVIDER_SITE_OTHER): Payer: Self-pay | Admitting: "Endocrinology

## 2018-03-02 ENCOUNTER — Ambulatory Visit (INDEPENDENT_AMBULATORY_CARE_PROVIDER_SITE_OTHER): Payer: 59 | Admitting: "Endocrinology

## 2018-03-02 VITALS — BP 145/90 | HR 57 | Ht 62.5 in | Wt 141.0 lb

## 2018-03-02 DIAGNOSIS — E890 Postprocedural hypothyroidism: Secondary | ICD-10-CM | POA: Insufficient documentation

## 2018-03-02 DIAGNOSIS — Z9884 Bariatric surgery status: Secondary | ICD-10-CM

## 2018-03-02 LAB — T4, FREE: T4 Free: 1.32 ng/dL (ref 0.70–1.48)

## 2018-03-02 LAB — T3, FREE: T3, Free: 2.78 pg/mL (ref 1.71–3.71)

## 2018-03-02 LAB — TSH: TSH: 0.08 u[IU]/mL — ABNORMAL LOW (ref 0.35–4.94)

## 2018-03-02 NOTE — Progress Notes (Signed)
Chief Complaint:  Chief Complaint   Patient presents with   . Hypothyroidism     Postsurgical       HPI:  Tracy [name] is a 53 y.o. female with postsurgical hypothyroidism, s/p sleeve gastrectomy in 2014 and revision in 2018 with 100lbs total weight loss, who is referred by Dr. Marisue Ivan for hypothyroidism.      Tracy [name] Tracy [name] was diagnosed with hypothyroidism after total thyroidectomy in 2013 (benign).      Patient complains of frequent bowel movements, heat intolerance, cold intolerance and palpitations, and brain fog.   Patient denies weight loss, weight gain, fatigue, constipation, hair loss, depressed mood, insomnia, body pain / joint pain and neck swelling and pain.    Patient is taking Synthroid brand daily.  Previously patient was taking Synthroid daily.  Her dose was increased in 10/2017 for TSH of 11  .    Patient is taking the following supplements: Biotin, Calcium, Multivitamin, Vitamin D 1000 IU and vitamin B12 (weekly nasal spray).    CT neck with contrast 07/18/2016 [facility]): 1. No mass or pathologically enlarged nodes to correspond to the reported right submental adenopathy.  2. Chronic inflammatory changes in the left sphenoid sinus with frothy material possibly representing superimposed acute sinusitis in the appropriate clinical setting.  3. No discernible thyroid tissue is identified. Correlate with surgical/treatment history and thyroid function tests if indicated.  4. A small 6 mm soft tissue lesion along the skin surface of the right neck as above is nonspecific but most likely an epidermal inclusion cyst. Correlate with direct inspection.    Labs:  Component 08/15/2017 08/25/2017 10/13/2017 01/20/2018   Hemoglobin A1C      4.6 - 5.9 % 4.5 (L)   4.7   TSH    0.35 - 4.94 uIU/mL 65.46 (H) 17.07 (H) 11.260 (H) 7.53 (H)   T4 Free    0.82 - 1.77 ng/dL 7.42 (L)  5.95    T3, Free  1.71 - 3.71 pg/mL 1.52 (L)      Vitamin B-12   211 - 911 pg/mL     715   Vitamin D 25-OH Total      30 - 100 ng/mL    57         Problem List:  Patient Active Problem List   Diagnosis   . Migraine   . History of hypertension   . Abdominal cyst   . Hiatal hernia   . Hypokalemia   . S/P gastric bypass   . Fatty liver   . Postsurgical hypothyroidism   . Tubular adenoma of colon   . Allergic rhinitis       Current Medications:  Current Outpatient Medications on File Prior to Visit   Medication Sig Dispense Refill   . Biotin 5 MG/ML Liquid Take by mouth daily        . butalbital-acetaminophen-caffeine (FIORICET, ESGIC) 50-325-40 MG per tablet Take 1 tablet by mouth every 4 (four) hours as needed for Headaches 10 tablet 0   . calcium-vitamin D (OSCAL-500 + D) 500-200 MG-UNIT per tablet Take 1 tablet by mouth daily     . Cyanocobalamin 5000 MCG SL Tab by Nasal route once a week     . Multiple Vitamins-Minerals (MULTIVITAMIN WITH MINERALS) tablet Take 1 tablet by mouth daily     . SYNTHROID 112 MCG tablet Take 1 tablet (112 mcg total) by mouth Once a day at 6:00am 90 tablet 1   .  vitamin D (CHOLECALCIFEROL) 25 MCG (1000 UT) tablet Take 1,000 Units by mouth daily     . Beclomethasone Dipropionate (QNASL) 80 MCG/ACT Aero Soln 2 sprays by Nasal route daily 3 Inhaler 1     No current facility-administered medications on file prior to visit.        Allergies:  Allergies   Allergen Reactions   . Bee Pollen Swelling     Other reaction(s): Cough, Cough (ALLERGY/intolerance)  Stuffy nose  Stuffy nose  "Bee Stings".Marland KitchenMarland KitchenMarland KitchenStuffy nose; "throat swelling"     . Vicodin [Hydrocodone-Acetaminophen] Nausea And Vomiting       Past Medical History:  Past Medical History:   Diagnosis Date   . Anemia    . Arthritis    . Carotid stenosis    . Fatty liver 2017   . GERD (gastroesophageal reflux disease)    . Hepatic cirrhosis    . Hiatal hernia    . Sleep apnea     not using CPAP machine   . Vitamin D deficiency        Past Surgical History:  Past Surgical History:   Procedure Laterality Date   . CHOLECYSTECTOMY   12/2016   . COLONOSCOPY, BIOPSY N/A 01/27/2018    Procedure: COLONOSCOPY, BIOPSY;  Surgeon: Shanon Brow, MD;  Location: [facility];  Service: Gastroenterology;  Laterality: N/A;   . EGD, BIOPSY N/A 01/27/2018    Procedure: EGD, BIOPSY;  Surgeon: Shanon Brow, MD;  Location: [facility];  Service: Gastroenterology;  Laterality: N/A;   . LAPAROSCOPIC REVISION SLEEVE GASTRECTOMY TO GASTRIC BYPASS     . LAPAROSCOPIC, GASTRECTOMY SLEEVE     . SALPINGO OOPHERECTOMY Bilateral    . THYROIDECTOMY  2013       Family History:  Family History   Problem Relation Age of Onset   . Cancer Mother    . Diabetes Mother    . Heart disease Mother    . Heart attack Mother    . Hypertension Mother    . Inflammatory bowel disease Mother    . Cancer Father    . Asthma Father    . Diabetes Father    . Hyperlipidemia Father    . Hypertension Father    . Asthma Sister    . Diabetes Sister    . Hyperlipidemia Sister    . Hypertension Sister    . Cancer Brother    . Hyperlipidemia Brother    . Hypertension Brother    . Breast cancer Paternal Aunt    . Breast cancer Other        Social History:  Social History     Socioeconomic History   . Marital status: Significant Other     Spouse name: Not on file   . Number of children: Not on file   . Years of education: Not on file   . Highest education level: Not on file   Occupational History   . Not on file   Social Needs   . Financial resource strain: Not on file   . Food insecurity:     Worry: Not on file     Inability: Not on file   . Transportation needs:     Medical: Not on file     Non-medical: Not on file   Tobacco Use   . Smoking status: Never Smoker   . Smokeless tobacco: Never Used   Substance and Sexual Activity   . Alcohol use: No   . Drug  use: No   . Sexual activity: Not on file   Lifestyle   . Physical activity:     Days per week: Not on file     Minutes per session: Not on file   . Stress: Not on file   Relationships   . Social connections:     Talks on phone:  Not on file     Gets together: Not on file     Attends religious service: Not on file     Active member of club or organization: Not on file     Attends meetings of clubs or organizations: Not on file     Relationship status: Not on file   . Intimate partner violence:     Fear of current or ex partner: Not on file     Emotionally abused: Not on file     Physically abused: Not on file     Forced sexual activity: Not on file   Other Topics Concern   . Not on file   Social History Narrative   . Not on file       ROS:   General: Denies unexplained weight gain or weight loss, Denies fatigue, weakness  Ophthalmologic: Denies blurry vision, double vision, eye pain, change in appearance of eyes  ENT: Denies difficulty swallowing, throat pain, or voice changes  Endocrine: + heat intolerance/ cold intolerance, denies sweats  Respiratory: Denies cough, shortness of breath   Cardiovascular: Denies palpitations, chest pain  Gastrointestinal: Denies abdominal pain, constipation, + chronic frequency in bowel movements, no nausea, vomiting  Genitourinary: Denies frequent urination, increased thirst  Musculoskeletal: Denies muscle aches, back pain, or joint pain  Skin: Denies rashes, dry skin, hair loss, excessive hair growth, inadequate hair growth  Neurologic: Denies dizziness, headaches, paresthesias, + brain fog  Psychiatric: Denies anxiety, depression, insomnia, hypersomnolence      Visit Vitals  BP 145/90   Pulse (!) 57   Ht 1.588 m (5' 2.5")   Wt 64 kg (141 lb)   BMI 25.38 kg/m        BP Readings from Last 3 Encounters:   03/04/18 136/84   03/02/18 145/90   02/16/18 118/68        Wt Readings from Last 4 Encounters:   03/04/18 1018 64.1 kg (141 lb 6.4 oz)   03/02/18 1544 64 kg (141 lb)   02/16/18 1354 65.1 kg (143 lb 9.6 oz)   01/27/18 0852 65.3 kg (144 lb)   01/20/18 1518 65.8 kg (145 lb)        Physical Exam:  GENERAL APPEARANCE: alert, in no acute distress, well developed, well nourished  ORAL CAVITY: normal oropharynx,  normal oral mucosa, normal dentition  EYES: EOMI, no proptosis, no conjunctival erythema, no lid lag  NECK/THYROID: neck supple, no cervical lymphadenopathy, no thyromegaly, no thyroid tenderness, no palpable thyroid nodules  HEART: S1, S2 normal, no murmurs, regular rate and rhythm.   LUNGS: normal effort / no distress, clear to auscultation bilaterally, no wheezes, rales, rhonchi  ABDOMEN: soft, NTND, nl BS, no hepatomegaly, no splenomegaly  EXTREMITIES: no clubbing, cyanosis, or edema B/L  NEUROLOGICAL: normal UE reflexes b/l, + fine hand tremor b/l  PSYCHIATRIC: normal mood, appropriate affect  SKIN: no rashes, normal hair distribution, no hirsutism, no acne        Assessment/Plan:  Tristen Pennino is a 53 y.o. female with    1. Postoperative hypothyroidism    2. S/P gastric bypass  1. Postoperative hypothyroidism  - T3, free; Future  - T4, free; Future  - TSH; Future  - Continue Synthroid daily for now, will change to [place] as per patient preference dose to be determined based on labs      Orders Placed This Encounter   . T3, free   . T4, free   . TSH        There are no discontinued medications.     No follow-ups on file.    Zerita Boers, MD

## 2018-03-04 ENCOUNTER — Ambulatory Visit (INDEPENDENT_AMBULATORY_CARE_PROVIDER_SITE_OTHER): Payer: 59 | Admitting: Gastroenterology

## 2018-03-04 ENCOUNTER — Encounter (INDEPENDENT_AMBULATORY_CARE_PROVIDER_SITE_OTHER): Payer: Self-pay | Admitting: Gastroenterology

## 2018-03-04 VITALS — BP 136/84 | HR 53 | Ht 62.52 in | Wt 141.4 lb

## 2018-03-04 DIAGNOSIS — K635 Polyp of colon: Secondary | ICD-10-CM

## 2018-03-04 DIAGNOSIS — K580 Irritable bowel syndrome with diarrhea: Secondary | ICD-10-CM

## 2018-03-04 DIAGNOSIS — K2950 Unspecified chronic gastritis without bleeding: Secondary | ICD-10-CM

## 2018-03-04 DIAGNOSIS — K2930 Chronic superficial gastritis without bleeding: Secondary | ICD-10-CM

## 2018-03-04 NOTE — Progress Notes (Signed)
Tracy [name], M.D.  [facility]  [address], [address]  [place], [place].  [address]  [phone]  [phone]    GI PROGRESS NOTE    Date Time: 03/04/2018 11:26 AM  Patient Name: Tracy [name]  Referring Physician: Harland German, MD      Subjective:   Patient was seen in follow-up today had an EGD that revealed mild gastritis biopsy was negative for H. pylori and showed chronic gastritis and colonoscopy that revealed 2 colon polyps the biopsies were tubular adenoma benign.  Patient continues to have diarrhea sometimes up to 9 times a day, severe at times with passing watery and soft and mushy stools no blood, this is been going on for the last 9 months is no reported fever chills, symptoms accompanied with lower abdominal pain mid abdomen prior to the BM and sometimes continues after the BM with mild to moderate intensity.  No aggravating factors and is not affected by meals.           Past Medical History:   Diagnosis Date   . Anemia    . Arthritis    . Carotid stenosis    . Fatty liver 2017   . GERD (gastroesophageal reflux disease)    . Hepatic cirrhosis    . Hiatal hernia    . Sleep apnea     not using CPAP machine   . Vitamin D deficiency             Past Surgical History:   Procedure Laterality Date   . CHOLECYSTECTOMY  12/2016   . COLONOSCOPY, BIOPSY N/A 01/27/2018    Procedure: COLONOSCOPY, BIOPSY;  Surgeon: Shanon Brow, MD;  Location: [facility];  Service: Gastroenterology;  Laterality: N/A;   . EGD, BIOPSY N/A 01/27/2018    Procedure: EGD, BIOPSY;  Surgeon: Shanon Brow, MD;  Location: [facility];  Service: Gastroenterology;  Laterality: N/A;   . LAPAROSCOPIC REVISION SLEEVE GASTRECTOMY TO GASTRIC BYPASS     . LAPAROSCOPIC, GASTRECTOMY SLEEVE     . SALPINGO OOPHERECTOMY Bilateral    . THYROIDECTOMY  2013            Family History   Problem Relation Age of Onset   . Cancer Mother    . Diabetes Mother    . Heart disease Mother    . Heart  attack Mother    . Hypertension Mother    . Inflammatory bowel disease Mother    . Cancer Father    . Asthma Father    . Diabetes Father    . Hyperlipidemia Father    . Hypertension Father    . Asthma Sister    . Diabetes Sister    . Hyperlipidemia Sister    . Hypertension Sister    . Cancer Brother    . Hyperlipidemia Brother    . Hypertension Brother    . Breast cancer Paternal Aunt    . Breast cancer Other             Social History     Socioeconomic History   . Marital status: Significant Other     Spouse name: None   . Number of children: None   . Years of education: None   . Highest education level: None   Occupational History   . None   Social Needs   . Financial resource strain: None   . Food insecurity:     Worry: None     Inability:  None   . Transportation needs:     Medical: None     Non-medical: None   Tobacco Use   . Smoking status: Never Smoker   . Smokeless tobacco: Never Used   Substance and Sexual Activity   . Alcohol use: No   . Drug use: No   . Sexual activity: None   Lifestyle   . Physical activity:     Days per week: None     Minutes per session: None   . Stress: None   Relationships   . Social connections:     Talks on phone: None     Gets together: None     Attends religious service: None     Active member of club or organization: None     Attends meetings of clubs or organizations: None     Relationship status: None   . Intimate partner violence:     Fear of current or ex partner: None     Emotionally abused: None     Physically abused: None     Forced sexual activity: None   Other Topics Concern   . None   Social History Narrative   . None       Social History     Socioeconomic History   . Marital status: Significant Other     Spouse name: Not on file   . Number of children: Not on file   . Years of education: Not on file   . Highest education level: Not on file   Occupational History   . Not on file   Social Needs   . Financial resource strain: Not on file   . Food insecurity:     Worry:  Not on file     Inability: Not on file   . Transportation needs:     Medical: Not on file     Non-medical: Not on file   Tobacco Use   . Smoking status: Never Smoker   . Smokeless tobacco: Never Used   Substance and Sexual Activity   . Alcohol use: No   . Drug use: No   . Sexual activity: Not on file   Lifestyle   . Physical activity:     Days per week: Not on file     Minutes per session: Not on file   . Stress: Not on file   Relationships   . Social connections:     Talks on phone: Not on file     Gets together: Not on file     Attends religious service: Not on file     Active member of club or organization: Not on file     Attends meetings of clubs or organizations: Not on file     Relationship status: Not on file   . Intimate partner violence:     Fear of current or ex partner: Not on file     Emotionally abused: Not on file     Physically abused: Not on file     Forced sexual activity: Not on file   Other Topics Concern   . Not on file   Social History Narrative   . Not on file            Allergies   Allergen Reactions   . Bee Pollen Swelling     Other reaction(s): Cough, Cough (ALLERGY/intolerance)  Stuffy nose  Stuffy nose  "Bee Stings".Marland KitchenMarland KitchenMarland KitchenStuffy nose; "throat swelling"     . Vicodin [Hydrocodone-Acetaminophen] Nausea  And Vomiting              Current Outpatient Medications:   .  Beclomethasone Dipropionate (QNASL) 80 MCG/ACT Aero Soln, 2 sprays by Nasal route daily, Disp: 3 Inhaler, Rfl: 1  .  Biotin 5 MG/ML Liquid, Take by mouth daily  , Disp: , Rfl:   .  butalbital-acetaminophen-caffeine (FIORICET, ESGIC) 50-325-40 MG per tablet, Take 1 tablet by mouth every 4 (four) hours as needed for Headaches, Disp: 10 tablet, Rfl: 0  .  calcium-vitamin D (OSCAL-500 + D) 500-200 MG-UNIT per tablet, Take 1 tablet by mouth daily, Disp: , Rfl:   .  Cyanocobalamin 5000 MCG SL Tab, by Nasal route once a week, Disp: , Rfl:   .  Multiple Vitamins-Minerals (MULTIVITAMIN WITH MINERALS) tablet, Take 1 tablet by mouth daily,  Disp: , Rfl:   .  SYNTHROID 112 MCG tablet, Take 1 tablet (112 mcg total) by mouth Once a day at 6:00am, Disp: 90 tablet, Rfl: 1  .  vitamin D (CHOLECALCIFEROL) 25 MCG (1000 UT) tablet, Take 1,000 Units by mouth daily, Disp: , Rfl:     Review of Systems:   Constitutional:No fever,chills,weight loss.  Eyes:No changes in vision  ENMT:No changes in hearing,nasal discharge,sore throat,oral lesions.  Respiratory:No wheezing or cough.  Cardiovascular:No chest pain or palpitations.  Genitourinary:No urinary frequency, dysuria or gross hematuria.  Gastrointestinal:See HPI.  Neurologic:No slurred speech, hemiplegia or focal deficit.    Physical Exam:     Vitals:    03/04/18 1020   BP: 136/84   Pulse: (!) 53         General appearance - Well developed and well nourished, no acute distress.  HEENT- Atraumatic, normocephalic. Anicteric not pale sclera,  Nares normal.  No oral lesions. Teeth  Neck - Supple.  No raised JVD.  Chest - Lungs are clear, no wheezing, rhonchi, or rales.  Heart - regular rate and rhythm without murmurs.  Abdomen - Soft, not distended, lower mid abdominal pain, no hepatosplenomegaly no masses, bowel sounds are normoactive, no ascites.  Musculoskeletal - No elicited weakness.   Neurologic - Alert and oriented times 3.   Rads/Labs:   Pathology report reviewed and biopsy showed mild chronic gastritis and also the labs/pathology of the colon biopsies revealed tubular adenoma no dysplasia.  Assessment:   1. IBS with diarrhea.  2. Benign colon polyp  3. Chronic gastritis    Plan:   1. Discussed with patient IBS in detail and recommended for her to go on high-fiber diet and went over her the diet in detail.  2. Patient was told we will try the diet for the next 4 to 6 weeks if she responded we will continue if not we may start medications at that time.  3. Patient had 2 benign colon polyps and her follow-up colonoscopy surveillance will be in 5 years  4. Patient had mild chronic gastritis on biopsy but no upper  abdominal pain epigastric pain we will monitor for now no need for treatment.    Signed by: Shanon Brow, MD

## 2018-03-04 NOTE — Patient Instructions (Signed)
Eating a High-Fiber Diet  Fiber is what gives strength and structure to plants. Most grains, beans, vegetables, and fruits contain fiber. Foods rich in fiber are often low in calories and fat, and they fill you up more. They may also reduce your risks for certain health problems. To find out the amount of fiber in canned, packaged, or frozen foods, read the Nutrition Facts label. It tells you how much fiber is in one serving.     Types of fiber and their benefits  There are two types of fiber: insoluble and soluble. They both aid digestion and help you maintain a healthy weight.   · Insoluble fiber. This is found in whole grains, cereals, certain fruits and vegetables such as apple skin, corn, and carrots. Insoluble fiber may prevent constipation and reduce the risk for certain types of cancer. It's called insoluble because it doesn't dissolve in water.  · Soluble fiber. This type of fiber is in oats, beans, and certain fruits and vegetables such as strawberries and peas. Soluble fiber can reduce cholesterol, which may help lower the risk for heart disease. It also helps control blood sugar levels.  Look for high-fiber foods  Try these foods to add fiber to your diet:  · Whole-grain breads and cereals. Try to eat 6 to 8 ounces a day. Include wheat and oat bran cereals, whole-wheat muffins or toast, and corn tortillas in your meals.  · Fruits. Try to eat 2 cups a day. Apples, oranges, strawberries, pears, and bananas are good sources. (Note: Fruit juice is low in fiber.)  · Vegetables. Try to eat at least 2.5 cups a day. Add asparagus, carrots, broccoli, peas, and corn to your meals.  · Beans. One cup of cooked lentils gives you over 15 grams of fiber. Try navy beans, lentils, and chickpeas.  · Seeds. A small handful of seeds gives you about 3 grams of fiber. Try sunflower or chia seeds.  Keep track of your fiber  Keep track of how much fiber you eat. Start by reading food labels. Then eat a variety of foods high  in fiber. As you start to eat more fiber, ask your healthcare provider how much water you should be drinking to keep your digestive system working smoothly.   Aim for a certain amount of fiber in your diet each day. The daily value for fiber is 25 grams a day. But some experts advise that women under 50 eat 25 to 28 grams per day and that men under 50 eat 30 to 38 grams per day. After age 50, your daily fiber needs drop to 22 grams for women and 28 grams for men.   Before you reach for the fiber supplements, think about this. Fiber is found naturally in healthy whole foods. It gives you that feeling of fullness after you eat. Taking fiber supplements or eating fiber-enriched foods will not give you this full feeling.   Your fiber intake is a good measure for the quality of your overall diet. If you are missing out on your daily amount of fiber, you may be lacking other important nutrients as well.   [employer] last reviewed this educational content on 07/06/2017  © 2000-2019 [employer] [address], [place] [address]. All rights reserved. This information is not intended as a substitute for professional medical care. Always follow your healthcare professional's instructions.        Diet and Lifestyle Tips for Irritable Bowel Syndrome (IBS)    Your healthcare professional may suggest some lifestyle changes to help   control your IBS. Changing your diet and managing stress are 2 of the most important. Follow your healthcare provider’s instructions and try some of the suggestions below.  Change your diet  Your diet may be an important cause of IBS symptoms. You may want to try the following:  · Pay attention to what foods bother you, and stay away from them. For example, dairy products are hard for some people to digest. Lactose-free dairy products may be better for your symptoms.  · Don't eat high FODMAP foods. Other common foods that can cause symptoms contain carbohydrates called FODMAPs. These  are poorly digested by your body. High FODMAP foods include some fruits such as apples, vegetables such as cabbage, and some dairy. They also include certain sweeteners such as high-fructose corn syrup, sorbitol, and xylitol. Many people find that eating a diet low in FODMAPs can ease symptoms. Talk with your healthcare provider about a low FODMAP diet.  · Drink 6 to 8 glasses of water a day.  · Don't have caffeine or tobacco. These are muscle stimulants and can affect the working of your digestive tract.  · Don't drink alcohol. It can irritate your digestive tract and make your symptoms worse.  · Eat more fiber if constipation is a problem. Fiber makes the stool softer and easier to pass through the colon.  · Eat more fiber if diarrhea is a problem. Fiber also helps to bind water. This can help to firm up loose stool.  Reduce stress  If stress or anxiety makes your IBS symptoms worse, learning how to manage stress may help you feel better. Try these tips:  · Identify the causes of stress in your life.  · Learn new ways to cope with them.  · Regular exercise is a great way to relieve stress. It can also help ease constipation. For adults, the CDC recommends 150 minutes of moderate activity each week. It also recommends muscle-strengthening activities 2 days a week. If this sounds like a lot of time, the CDC suggests breaking physical activity into 10-minute blocks and spreading it out over a week. Developing a schedule that works for you is the key to a successful exercise program.  [employer] last reviewed this educational content on 08/06/2016  © 2000-2019 [employer]. [address], [place] [address]. All rights reserved. This information is not intended as a substitute for professional medical care. Always follow your healthcare professional's instructions.

## 2018-03-05 ENCOUNTER — Other Ambulatory Visit (INDEPENDENT_AMBULATORY_CARE_PROVIDER_SITE_OTHER): Payer: Self-pay | Admitting: "Endocrinology

## 2018-03-05 ENCOUNTER — Other Ambulatory Visit (INDEPENDENT_AMBULATORY_CARE_PROVIDER_SITE_OTHER): Payer: Self-pay | Admitting: Family Medicine

## 2018-03-05 ENCOUNTER — Encounter (INDEPENDENT_AMBULATORY_CARE_PROVIDER_SITE_OTHER): Payer: Self-pay | Admitting: "Endocrinology

## 2018-03-05 DIAGNOSIS — J309 Allergic rhinitis, unspecified: Secondary | ICD-10-CM

## 2018-03-05 MED ORDER — ARMOUR THYROID 60 MG PO TABS
60.0000 mg | ORAL_TABLET | Freq: Every day | ORAL | 2 refills | Status: DC
Start: 2018-03-05 — End: 2018-06-05

## 2018-03-05 NOTE — Progress Notes (Signed)
Tracy [name],   Your lab results show that your thyroid is slightly overactive now on the current levothyroxine daily dosage. This may explain some of the symptoms that you are having.  Given our discussion during the visit, I will send in a script for 60mg  of Armour which is equivalent to of levothyroxine.  Please stop levothyroxine when you start the Armour and take the Armour like you take levothyroxine first thing in the morning 30 minutes before breakfast and at least 4 hours apart from vitamins.    Please have your labs repeated in 4 weeks to check your levels.  I will put in a script for the labs and you can do them in the office.  Please also schedule an office visit for 3 months to see me, but I can see you sooner if needed if you have any questions or concerns.   Best,  Dr. Rivka Safer

## 2018-03-29 ENCOUNTER — Telehealth: Payer: Self-pay

## 2018-03-29 NOTE — Telephone Encounter (Signed)
Pt Tracy [name] called [facility]) for advice related to their current health.     Pt last evaluated by our office on: 02/16/18    Covid-19 Screening:   Close contact with a laboratory- confirmed COVID- 19 patient: NO  Travel history to [place], [place], [place], [place], [place] within past 14 days: NO  Travel history inside the continental [place] within past 14 days ([place], [place], [place], [place]): NO  Travel history international in past 14 days: NO  Fever: NO  Age: 53 y.o.   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    Pt currently complaining of:    Itchy throat, headaches, dry  Mouth, body aches, dry cough, chills at night (no fever), slight shortness of breath with activity and chest tightness.  Denies any fever (current temperature is 98.6), recent travel or close contact with confirmed COVID-19 person.  Patient has a history of sinus infection/sinus-related issues.    Pt currently taking these medications:     N/A    Assessment/ Plan:      Pt has been triaged.  Based on symptoms, appointment has been scheduled.  Pt instructed to call office upon arrival and will be given a mask to wear in office.

## 2018-03-30 ENCOUNTER — Telehealth: Payer: Self-pay | Admitting: Family Medicine

## 2018-03-30 ENCOUNTER — Ambulatory Visit (INDEPENDENT_AMBULATORY_CARE_PROVIDER_SITE_OTHER): Payer: 59 | Admitting: Family Medicine

## 2018-03-30 ENCOUNTER — Encounter (INDEPENDENT_AMBULATORY_CARE_PROVIDER_SITE_OTHER): Payer: Self-pay | Admitting: Family Medicine

## 2018-03-30 VITALS — BP 139/86 | HR 55 | Temp 97.4°F | Resp 18 | Ht 62.0 in | Wt 141.6 lb

## 2018-03-30 DIAGNOSIS — J301 Allergic rhinitis due to pollen: Secondary | ICD-10-CM

## 2018-03-30 DIAGNOSIS — B349 Viral infection, unspecified: Secondary | ICD-10-CM

## 2018-03-30 DIAGNOSIS — J209 Acute bronchitis, unspecified: Secondary | ICD-10-CM

## 2018-03-30 MED ORDER — CETIRIZINE HCL 10 MG PO TABS
10.0000 mg | ORAL_TABLET | Freq: Every day | ORAL | 1 refills | Status: DC
Start: 2018-03-30 — End: 2018-11-05

## 2018-03-30 NOTE — Telephone Encounter (Signed)
Inform patient that her symptoms are not bacterial as of this time and Z-pack is not indicated. She was advised to callback if her symptoms worsen by Friday.

## 2018-03-30 NOTE — Telephone Encounter (Signed)
Please advise 

## 2018-03-30 NOTE — Telephone Encounter (Signed)
Patient would like for Dr. Toni Arthurs to also prescribe her z-pak for 7 days and send to pharmacy on file. She was just in today to see PCP. Please advise and give patient a call when it has been sent.    She can be reached at: [phone]    Thank you.

## 2018-03-30 NOTE — Progress Notes (Signed)
Subjective:       Patient ID: Tracy [name] is a 53 y.o. female.    HPI  C/o sore throat, nasal congestion and clear rhinorrhea, chest tightness and dry cough x 4 days. Had fever 4 days ago, Tmax 99-100F but has resolved. A bit better today after using OTC cough meds last night. Has recurrent bronchitis, usually around Spring/Fall time, does not have hx of asthma and has an albuterol inhaler but has not used. Has seasonal allergies, usually controlled with Qnsal but not covered by insurance. Zyrtec 10mg  daily. Has never been on allergy shots in the past. Son not sick. No direct contact with COVID-confirmed case, no recent travel.    The following portions of the patient's history were reviewed and updated as appropriate: allergies, current medications, past family history, past medical history, past social history, past surgical history and problem list.    Review of Systems   Constitutional: Positive for fever. Negative for chills, diaphoresis and fatigue.   HENT: Positive for congestion, postnasal drip, rhinorrhea and sneezing. Negative for sinus pressure, sinus pain and sore throat.    Respiratory: Positive for cough, chest tightness and shortness of breath. Negative for wheezing.    Cardiovascular: Negative for chest pain and palpitations.   Gastrointestinal: Negative for diarrhea and nausea.   Musculoskeletal: Negative for myalgias.   Neurological: Negative for headaches.           Objective:    Physical Exam  Vitals signs reviewed.   Constitutional:       General: She is not in acute distress.     Appearance: She is not diaphoretic.   HENT:      Head: Normocephalic and atraumatic.      Right Ear: Ear canal and external ear normal. Tympanic membrane is retracted.      Left Ear: Ear canal and external ear normal. Tympanic membrane is retracted.      Nose: Mucosal edema, congestion and rhinorrhea present. No nasal deformity.      Right Turbinates: Enlarged and swollen.      Left Turbinates: Enlarged and  swollen.      Right Sinus: No maxillary sinus tenderness or frontal sinus tenderness.      Left Sinus: No maxillary sinus tenderness or frontal sinus tenderness.      Mouth/Throat:      Mouth: Mucous membranes are moist. No oral lesions.      Pharynx: Uvula midline. No oropharyngeal exudate or posterior oropharyngeal erythema.      Tonsils: No tonsillar abscesses.   Neck:      Musculoskeletal: Normal range of motion and neck supple.      Thyroid: No thyromegaly.   Cardiovascular:      Rate and Rhythm: Normal rate and regular rhythm.      Heart sounds: Normal heart sounds. No murmur.   Pulmonary:      Effort: Pulmonary effort is normal. No respiratory distress.      Breath sounds: Normal breath sounds. No wheezing or rales.   Lymphadenopathy:      Cervical: No cervical adenopathy.      Upper Body:      Right upper body: No supraclavicular adenopathy.      Left upper body: No supraclavicular adenopathy.   Skin:     General: Skin is warm and dry.   Neurological:      Mental Status: She is alert and oriented to person, place, and time.   Psychiatric:  Behavior: Behavior normal.         Thought Content: Thought content normal.         Judgment: Judgment normal.       BP 139/86 (BP Site: Left arm, Patient Position: Sitting, Cuff Size: Medium)   Pulse (!) 55   Temp 97.4 F (36.3 C) (Tympanic)   Resp 18   Ht 1.575 m (5\' 2" )   Wt 64.2 kg (141 lb 9.6 oz)   BMI 25.90 kg/m         Assessment:       1. Acute bronchitis, unspecified organism     2. Seasonal allergic rhinitis due to pollen  cetirizine (ZYRTEC ALLERGY) 10 MG tablet   3. Viral syndrome           Plan:      Procedures  No orders of the defined types were placed in this encounter.    1. Acute bronchitis, unspecified organism  - Recurrent. Likely has asthma. Triggered by either allergic rhinitis vs viral syndrome. Pt brought in her albuterol inhaler HFA in clinic and was using it incorrectly. Instructed of proper inhaler use, to use albuterol HFA 2 puffs  q4hrs prn.     2. Seasonal allergic rhinitis due to pollen  - Uncontrolled. Discussed about chronic nature of illness. Start cetirizine (ZYRTEC ALLERGY) 10 MG tablet; Take 1 tablet (10 mg total) by mouth daily until end of Spring  Dispense: 90 tablet; Refill: 1  - If no improvement, will add Flonase nasal 2 sprays/nostril     3. Viral syndrome  - Due to current COVID pandemic and inadequate testing kits, advised to self-quarantine at home until symptom-free. Discussed natural course of illness. Symptomatic treatment with OTC decongestants, Tylenol prn. Increase PO fluids, rest. Given and discussed handout on diagnosis, treatment and warning signs. Risk & Benefits of the new medication(s) were explained to the pt (and family) who appeared to understand & agree to the treatment plan.      RTC 2 months for bronchitis, allergies

## 2018-03-30 NOTE — Patient Instructions (Signed)
Stay home if you are sick. Stay home if you have symptoms of a fever, cough, or shortness of breath.   If a member of your household is sick, stay home from school and work to avoid spreading COVID-19 to others.  • If your children are in the care of others, urge caregivers to watch for COVID-19 symptoms.    Continue practicing everyday preventive actions. Cover coughs and sneezes with a tissue and wash your hands often with soap and water for at least 20 seconds. If soap and water are not available, use a hand sanitizer that contains 60% alcohol. Clean frequently touched surfaces and objects daily using a regular household detergent and water.    Use the separate room and bathroom you prepared for sick household members (if possible).  Avoid sharing personal items like food and drinks. Provide your sick household member with clean disposable facemasks to wear at home, if available, to help prevent spreading COVID-19 to others. Clean the sick room and bathroom, as needed, to avoid unnecessary contact with the sick person.  • If surfaces are dirty, they should be cleaned using a detergent and water prior to disinfection. For disinfection, a list of products with EPA-approved emerging viral pathogens claims, maintained by the CDC (See CDC website for more information).  Always follow the manufacturer’s instructions for all cleaning and disinfection products.    Stay in touch with others by phone or email. If you live alone and become sick during a COVID-19 outbreak, you may need help. If you have a chronic medical condition and live alone, ask family, friends, and health care providers to check on you during an outbreak. Stay in touch with family and friends with chronic medical conditions.

## 2018-03-30 NOTE — Telephone Encounter (Signed)
Pt was advised and verbalized understanding.

## 2018-03-31 ENCOUNTER — Telehealth (INDEPENDENT_AMBULATORY_CARE_PROVIDER_SITE_OTHER): Payer: Self-pay | Admitting: Family Medicine

## 2018-03-31 DIAGNOSIS — J209 Acute bronchitis, unspecified: Secondary | ICD-10-CM

## 2018-03-31 MED ORDER — AEROCHAMBER Z-STAT PLUS/MEDIUM MISC
0 refills | Status: AC
Start: 2018-03-31 — End: ?

## 2018-03-31 NOTE — Telephone Encounter (Signed)
Please advise 

## 2018-03-31 NOTE — Telephone Encounter (Signed)
Spoke with patient. Not getting relief from her albuterol but during visit yesterday, was unable to use albuterol HFA correctly. Will send for aerochamber to use with HFA and use 2 puffs every 4 hours scheduled x 2 days. Son will pickup script. Advised that if tylenol not available, may use Dayquil/Nyquil. She understands to call if worse.

## 2018-03-31 NOTE — Telephone Encounter (Signed)
Pt called stating she had an appt yesterday and was told she might have an upper respiratory infection. Pt was advised to give a callback to PCP if she is still experiencing symptoms or if they have worsened. Pt stated they have worsened and would like callback ASAP. Please advise.    T: [phone]

## 2018-04-02 ENCOUNTER — Ambulatory Visit (INDEPENDENT_AMBULATORY_CARE_PROVIDER_SITE_OTHER): Payer: 59 | Admitting: "Endocrinology

## 2018-04-13 ENCOUNTER — Ambulatory Visit (INDEPENDENT_AMBULATORY_CARE_PROVIDER_SITE_OTHER): Payer: 59 | Admitting: "Endocrinology

## 2018-04-19 ENCOUNTER — Telehealth (INDEPENDENT_AMBULATORY_CARE_PROVIDER_SITE_OTHER): Payer: 59 | Admitting: Gastroenterology

## 2018-04-19 DIAGNOSIS — R194 Change in bowel habit: Secondary | ICD-10-CM

## 2018-04-19 NOTE — Patient Instructions (Signed)
Patient may take Imodium once or twice a day as needed.  She can follow-up in the office as needed.

## 2018-04-19 NOTE — Telephone Encounter (Signed)
Spoke with pt but pt needs call later, @10 :35 am d/t work conference call

## 2018-04-19 NOTE — Telephone Encounter (Signed)
Spoke with pt concerning diarrhea. Pt is having diarrhea daily, 2x in morning and up to 12x per day. Pt has abd pain (6/10 usually but can be 10/10), no fever and no blood in diarrhea. Pt states that food runs right through them. Pt believes that this is d/t gallbladder removal. Pt complains of loud stomach noises and cramping. Pt is not taking any imodium or antidiarrheal medications, though has an OTC diarrhea control remedy they have not used yet.

## 2018-04-19 NOTE — Progress Notes (Addendum)
Office phone visit:  Verbal consent has been obtained from the patient to conduct a telephone visit encounter to minimize exposure to Covid 19.    Chief complaint: Frequent bowel movements.      HPI:  Patient stated that she still has frequent bowel movement up to 7-8 times a day and about 25% of the time its watery, there is some abdominal cramps in the lower abdomen with that, there was no bleeding no nausea no vomiting she has been eating well.  She has got over-the-counter antidiarrhea medication but has not used it yet.  Is no reported fever chills she has been known to have an allergic rhinitis and has been on some inhaler treatment.  Patient already had a work-up earlier this year with EGD and colonoscopy there was no colitis noted, she also had cholecystectomy in December 2018.    Problem list/medications/allergies all reviewed.    Impression/plan:  Diarrhea?  Etiology could be postcholecystectomy diarrhea also could be IBS/she had a colonoscopy to rule out colitis.  I have recommended for the patient to avoid any greasy foods or fatty meals that aggravates her diarrhea and also may use antidiarrheal agent once or twice a day such as Imodium and to hold that if she had any constipation.  She is to continue monitoring her symptoms.  Patient can be followed up as needed.    Time spent in medical discussion 15 minutes

## 2018-04-27 ENCOUNTER — Encounter (INDEPENDENT_AMBULATORY_CARE_PROVIDER_SITE_OTHER): Payer: Self-pay

## 2018-04-28 ENCOUNTER — Telehealth (INDEPENDENT_AMBULATORY_CARE_PROVIDER_SITE_OTHER): Payer: 59 | Admitting: "Endocrinology

## 2018-04-28 ENCOUNTER — Encounter (INDEPENDENT_AMBULATORY_CARE_PROVIDER_SITE_OTHER): Payer: Self-pay | Admitting: "Endocrinology

## 2018-04-28 DIAGNOSIS — E890 Postprocedural hypothyroidism: Secondary | ICD-10-CM

## 2018-04-28 NOTE — Progress Notes (Signed)
Chief Complaint:  Chief Complaint   Patient presents with   . Hypothyroidism       HPI:  Tracy [name] is a 53 y.o. female with postsurgical hypothyroidism, s/p sleeve gastrectomy in 2014 and revision in 2018 with 100lbs total weight loss, who returns for follow up for hypothyroidism.      Tracy [name] was diagnosed with hypothyroidism after total thyroidectomy in 2013 (benign).      Patient is taking Armour thyroid 60mg  daily.  Previously patient was taking Synthroid daily.      Patient is taking the following supplements: Biotin, Calcium, Multivitamin, Vitamin D 1000 IU, vitamin B12 (weekly nasal spray), seaweed supplement.    Fatigue has slightly improved, brain fog has improved.  She denies heat/cold intolerance and palpitations.  She continues to have sweats.  She has been battling a sinus infection for the last few weeks.    She is continuing to have frequent bowel movements, on imodium now and watching her diet.      Imaging:  CT neck with contrast 07/18/2016 [facility]): 1. No mass or pathologically enlarged nodes to correspond to the reported right submental adenopathy.  2. Chronic inflammatory changes in the left sphenoid sinus with frothy material possibly representing superimposed acute sinusitis in the appropriate clinical setting.  3. No discernible thyroid tissue is identified. Correlate with surgical/treatment history and thyroid function tests if indicated.  4. A small 6 mm soft tissue lesion along the skin surface of the right neck as above is nonspecific but most likely an epidermal inclusion cyst. Correlate with direct inspection.    Labs:  Component 08/15/2017 08/25/2017 10/13/2017 01/20/2018   TSH   0.35 - 4.94 uIU/mL 65.46 (H) 17.07 (H) 11.260 (H) 7.53 (H)   T4 Free  0.70 - 1.48 ng/dL 1.61 (L)  0.96    T3, Free   1.71 - 3.71 pg/mL 1.52 (L)        Component 03/02/2018   TSH  0.35 - 4.94 uIU/mL 0.08 (L)   T4 Free  0.70 - 1.48 ng/dL 0.45   T3, Free 4.09 -  3.71 pg/mL 2.78         Problem List:  Patient Active Problem List   Diagnosis   . Migraine   . History of hypertension   . Abdominal cyst   . Hiatal hernia   . Hypokalemia   . S/P gastric bypass   . Fatty liver   . Postsurgical hypothyroidism   . Tubular adenoma of colon   . Allergic rhinitis       Current Medications:  Current Outpatient Medications on File Prior to Visit   Medication Sig Dispense Refill   . albuterol (PROVENTIL HFA;VENTOLIN HFA) 108 (90 Base) MCG/ACT inhaler Inhale 2 puffs into the lungs every 4 (four) hours as needed for Wheezing or Shortness of Breath (cough)     . ARMOUR THYROID 60 MG tablet Take 1 tablet (60 mg total) by mouth daily 30 tablet 2   . Beclomethasone Dipropionate (QNASL) 80 MCG/ACT Aero Soln 2 sprays by Nasal route daily 3 Inhaler 1   . Biotin 5 MG/ML Liquid Take by mouth daily        . butalbital-acetaminophen-caffeine (FIORICET, ESGIC) 50-325-40 MG per tablet Take 1 tablet by mouth every 4 (four) hours as needed for Headaches 10 tablet 0   . calcium-vitamin D (OSCAL-500 + D) 500-200 MG-UNIT per tablet Take 1 tablet by mouth daily     . cetirizine (  ZYRTEC ALLERGY) 10 MG tablet Take 1 tablet (10 mg total) by mouth daily 90 tablet 1   . Cyanocobalamin 5000 MCG SL Tab by Nasal route once a week     . Multiple Vitamins-Minerals (MULTIVITAMIN WITH MINERALS) tablet Take 1 tablet by mouth daily     . Spacer/Aero-Holding Chambers (AEROCHAMBER Z-STAT PLUS/MEDIUM) inhaler Use as instructed 1 each 0   . vitamin D (CHOLECALCIFEROL) 25 MCG (1000 UT) tablet Take 1,000 Units by mouth daily       No current facility-administered medications on file prior to visit.        Allergies:  Allergies   Allergen Reactions   . Bee Pollen Swelling     "Bee Stings".Marland KitchenMarland KitchenMarland KitchenStuffy nose; "throat swelling"     . Vicodin [Hydrocodone-Acetaminophen] Nausea And Vomiting       Past Medical History:  Past Medical History:   Diagnosis Date   . Anemia    . Arthritis    . Carotid stenosis    . Fatty liver 2017   . GERD  (gastroesophageal reflux disease)    . Hepatic cirrhosis    . Hiatal hernia    . Sleep apnea     not using CPAP machine   . Vitamin D deficiency        Past Surgical History:  Past Surgical History:   Procedure Laterality Date   . CHOLECYSTECTOMY  12/2016   . COLONOSCOPY, BIOPSY N/A 01/27/2018    Procedure: COLONOSCOPY, BIOPSY;  Surgeon: Shanon Brow, MD;  Location: [facility];  Service: Gastroenterology;  Laterality: N/A;   . EGD, BIOPSY N/A 01/27/2018    Procedure: EGD, BIOPSY;  Surgeon: Shanon Brow, MD;  Location: [facility];  Service: Gastroenterology;  Laterality: N/A;   . LAPAROSCOPIC REVISION SLEEVE GASTRECTOMY TO GASTRIC BYPASS     . LAPAROSCOPIC, GASTRECTOMY SLEEVE     . SALPINGO OOPHERECTOMY Bilateral    . THYROIDECTOMY  2013       Family History:  Family History   Problem Relation Age of Onset   . Cancer Mother         hx ovarian   . Hypertension Mother    . Inflammatory bowel disease Mother    . Heart failure Mother    . Angina Mother    . Asthma Father    . Diabetes Father    . Hyperlipidemia Father    . Hypertension Father    . Cancer Father         Currently has cancer   . Crohn's disease Father    . Asthma Sister    . Diabetes Sister    . Hyperlipidemia Sister    . Hypertension Sister    . Cancer Brother         hx of stomach   . Hyperlipidemia Brother    . Hypertension Brother    . Breast cancer Paternal Aunt         died of    . Breast cancer Other         maternal cousin-- survior       Social History:  Social History     Socioeconomic History   . Marital status: Significant Other     Spouse name: Not on file   . Number of children: Not on file   . Years of education: Not on file   . Highest education level: Not on file   Occupational History   . Not on file   Social  Needs   . Financial resource strain: Not on file   . Food insecurity:     Worry: Not on file     Inability: Not on file   . Transportation needs:     Medical: Not on file     Non-medical: Not on file    Tobacco Use   . Smoking status: Never Smoker   . Smokeless tobacco: Never Used   Substance and Sexual Activity   . Alcohol use: No   . Drug use: No   . Sexual activity: Not on file   Lifestyle   . Physical activity:     Days per week: Not on file     Minutes per session: Not on file   . Stress: Not on file   Relationships   . Social connections:     Talks on phone: Not on file     Gets together: Not on file     Attends religious service: Not on file     Active member of club or organization: Not on file     Attends meetings of clubs or organizations: Not on file     Relationship status: Not on file   . Intimate partner violence:     Fear of current or ex partner: Not on file     Emotionally abused: Not on file     Physically abused: Not on file     Forced sexual activity: Not on file   Other Topics Concern   . Not on file   Social History Narrative   . Not on file         There were no vitals taken for this visit.     BP Readings from Last 3 Encounters:   03/30/18 139/86   03/04/18 136/84   03/02/18 145/90        Wt Readings from Last 4 Encounters:   03/30/18 0951 64.2 kg (141 lb 9.6 oz)   03/04/18 1018 64.1 kg (141 lb 6.4 oz)   03/02/18 1544 64 kg (141 lb)   02/16/18 1354 65.1 kg (143 lb 9.6 oz)      Physical Exam:  GENERAL APPEARANCE: alert, in no acute distress, well developed, well nourished  HEENT: no proptosis, no scleral icterus, no conjunctival erythema  SKIN: no hirsutism, no acne  PSYCHIATRIC: normal mood, appropriate affect      Assessment/Plan:  Tracy [name] is a 53 y.o. female with    1. Postoperative hypothyroidism        1. Postoperative hypothyroidism Clinically improved on Armour, dose may need to be adjusted  - Continue Armour 60mg  daily  - Check TFTs      Orders Placed This Encounter   . T3, free   . T4, free   . TSH        There are no discontinued medications.     Return in about 3 months (around 07/28/2018).    Zerita Boers, MD

## 2018-05-04 ENCOUNTER — Ambulatory Visit
Admission: RE | Admit: 2018-05-04 | Discharge: 2018-05-04 | Disposition: A | Discharge status: Home / Self Care | Payer: No Typology Code available for payment source | Source: Ambulatory Visit | Attending: "Endocrinology | Admitting: "Endocrinology

## 2018-05-04 DIAGNOSIS — E890 Postprocedural hypothyroidism: Secondary | ICD-10-CM | POA: Insufficient documentation

## 2018-05-04 LAB — TSH: TSH: 0.35 u[IU]/mL (ref 0.35–4.94)

## 2018-05-04 LAB — T3, FREE: T3, Free: 3.09 pg/mL (ref 1.71–3.71)

## 2018-05-04 LAB — T4, FREE: T4 Free: 0.8 ng/dL (ref 0.70–1.48)

## 2018-05-05 ENCOUNTER — Encounter (INDEPENDENT_AMBULATORY_CARE_PROVIDER_SITE_OTHER): Payer: Self-pay | Admitting: Gastroenterology

## 2018-06-04 ENCOUNTER — Other Ambulatory Visit (INDEPENDENT_AMBULATORY_CARE_PROVIDER_SITE_OTHER): Payer: Self-pay | Admitting: "Endocrinology

## 2018-07-03 ENCOUNTER — Emergency Department
Admission: EM | Admit: 2018-07-03 | Discharge: 2018-07-03 | Disposition: A | Discharge status: Home / Self Care | Payer: No Typology Code available for payment source | Attending: Emergency Medicine | Admitting: Emergency Medicine

## 2018-07-03 ENCOUNTER — Emergency Department: Payer: No Typology Code available for payment source

## 2018-07-03 DIAGNOSIS — G44319 Acute post-traumatic headache, not intractable: Secondary | ICD-10-CM | POA: Insufficient documentation

## 2018-07-03 MED ORDER — PROCHLORPERAZINE MALEATE 5 MG PO TABS
10.00 mg | ORAL_TABLET | Freq: Once | ORAL | Status: AC
Start: 2018-07-03 — End: 2018-07-03
  Administered 2018-07-03: 21:00:00 10 mg via ORAL
  Filled 2018-07-03: qty 2

## 2018-07-03 MED ORDER — NAPROXEN 250 MG PO TABS
500.00 mg | ORAL_TABLET | Freq: Once | ORAL | Status: AC
Start: 2018-07-03 — End: 2018-07-03
  Administered 2018-07-03: 21:00:00 500 mg via ORAL
  Filled 2018-07-03: qty 2

## 2018-07-03 MED ORDER — DIPHENHYDRAMINE HCL 25 MG PO CAPS
50.00 mg | ORAL_CAPSULE | Freq: Once | ORAL | Status: AC
Start: 2018-07-03 — End: 2018-07-03
  Administered 2018-07-03: 21:00:00 50 mg via ORAL
  Filled 2018-07-03: qty 2

## 2018-07-03 MED ORDER — NAPROXEN 500 MG PO TABS
500.00 mg | ORAL_TABLET | Freq: Two times a day (BID) | ORAL | 0 refills | Status: AC | PRN
Start: 2018-07-03 — End: 2018-07-10

## 2018-07-03 MED ORDER — ONDANSETRON 4 MG PO TBDP
4.00 mg | ORAL_TABLET | Freq: Once | ORAL | Status: AC
Start: 2018-07-03 — End: 2018-07-03
  Administered 2018-07-03: 21:00:00 4 mg via ORAL
  Filled 2018-07-03: qty 1

## 2018-07-03 NOTE — ED Triage Notes (Signed)
Pt arrived ambulatory with c/o intermittent headache s/p head injury.  Pt hit her head on the back door of the SUV one week ago.  Pt c/o headache persistent dull headache.  Pt c/o nasusea off and on all week.  Pt denies taking any pain medication today.  Pt rates pain 9/10.  Pt stated "I usually get headaches in the front, but this one hurts in the back too.  My forehead is still tender in the spot where I hit  It.  No bruising or swelling noted.

## 2018-07-03 NOTE — ED Notes (Signed)
Pt went to and from xray dept.

## 2018-07-06 NOTE — Progress Notes (Signed)
[name]  [address] [place] [address]  Phone: [phone]      Fax: [phone]    CONCUSSION CLINIC ASSESSMENT    PATIENT: Tracy [name] DOB: Aug 06, 1965   MR #: [id number]  AGE: 53 y.o.    DATE OF VISIT:  07/07/18 PRIMARY MD: Tracy German, MD      History of Present Illness:     DOI: 06/16/18  MOI: Tracy [name] is a 53 y.o. year old female  who presents for an evaluation of concussion.  Hit head on hatch of car. No LOC, vomiting. Had HA right away-lasted one week. Telemedicine visit done due to Covid 19 pandemic with pt consent. Returned to work 07/05/18.     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 Bee Pollen Swelling     "Bee Stings".Marland KitchenMarland KitchenMarland KitchenStuffy nose; "throat swelling"     . Vicodin [Hydrocodone-Acetaminophen] Nausea And Vomiting       Medications:     Tracy [name] has a current medication list which includes the following prescription(s): albuterol, armour thyroid, beclomethasone dipropionate, biotin, butalbital-acetaminophen-caffeine, calcium-vitamin d, cetirizine, cyanocobalamin, multivitamin with minerals, naproxen, aerochamber z-stat plus/medium, and vitamin d.    Review of Systems:   See scanned Case History/PCSS form for patient's numerical rating of symptoms.      Constitutional: Positive for fatigue/malaise.    Respiratory: Negative for cough and shortness of breath.  Gastrointestinal: Positive for nausea, negative for vomiting and abdominal pain.   Skin: Negative for ecchymosis, rash, pruritis.    Musculoskeletal: Negative for back pain, Positive for neck pain.  Neurological: Positive for headaches, Positive for dizziness. Negative for tingling, slurred speech .  HENT: Positive for noise sensitivity.  Positive for photophobia and Positive blurred vision with pro-longed concentration.      Psychiatric/Behavioral: Positive for decreased concentration.   Positive for difficulty remembering.   The patient is  having difficulty falling asleep or with fragmented sleep.   The patient is not more emotional, irritable, anxious or sad than normal.    Physical Exam:   General appearance - well developed, alert and oriented.  Mental status/ Psych: good eye contact, affect WNL  Head - normocephalic, atraumatic  Eyes - PERRLA,  extraocular eye movements intact. No nystagmus.  Ears/Nose-  external ears.    Mouth - mucous membranes moist  Neck - supple-full AROM  Chest - easy respiratory effort, equal chest rise, no distress or cough.  Heart - pink, well perfused skin.  Neurological - AAOx3, gross motor intact, alert  Skin: warm, dry, no wound      Assessment:   Concussion without initial loss of consciousness, symptoms still occurring.  Based on my evaluation, my opinion is that the patient has sustained a definite concussion with improving symptoms  . There are risk factors for a prolonged recovery.     I have reviewed previous medical records.   Case discussed with:  Physical Therapy     Education provided to patient and family regarding the pathophysiology and typical recovery following concussions.  Also discussed the importance of avoiding head threatening activities to prevent further injury to include second impact syndrome.  Patient educated on the benefits of a gradual  step-wise progression back to daily activities to support recovery.    I have instructed the patient on the benefits of a healthy diet with an increase in protein intake, reviewed hydration and the importance of a regular sleep pattern and sleep hygiene to support recovery. In terms of exercise, I have recommend the patient to start with light cardiovascular exercise, such as a leisurely walk and to progress to moderate levels of cardiovascular exercise as tolerated.     Patient educated on the below supplements that may be taken IF NEEDED:  1.    For Headache prevention/sleep hygiene: May start Vitamin B2 (Riboflavin) 200-400mg  by mouth daily with breakfast and  Magnesium Oxide 250-500mg  by mouth daily with breakfast.  Appropriate dosing for age discussed.    2.   You may take Tylenol or Motrin  as needed for breakthrough headaches but try not to take consistently as this can cause rebound headaches. Add a daily vitamin.   3.   Melatonin 3-5 mg one hour before bed for sleep disturbance and benadryl if needed.( instructions on dose and side effects given).   4.  Omega 3 (Fish Oil) 1200mg  twice day a  for general overall brain recovery.    Discussed strategies to re-enter school/work and manage symptoms during recovery.    Total face to face time with patient/family was 45 minutes with more than half the time spent in counseling and coordination of care.     Plan:     This patients has  impairments that we will monitor and treat as indicated by our concussion team.     Follow-up with physical therapy weekly.     Considerations for physical therapy:    None.    MRI brain  Follow-up with medical provider in 3-4 weeks if concussion symptoms continue     Work Instructions:          Return to Work Recommendations for Tracy [name]    Concussion is a temporary dysfunction of the brain's normal activity which may affect your ability to learn and work.  It is an energy crisis of the brain, and as such recovery depends  on the balance of energy use and conservation.  Returning to activities should follow a step wise fashion, gradually increasing the time you spend in each activity as tolerated.  Initially, time spent in that activity should be short with frequent rest periods, and gradually lengthened as tolerated.   These modifications will help you conserve energy and continue to progress until a full recovery occurs.      You are in Return to Work Stage:  YELLOW work from home  Recommended Modifications:    Attendance:   Attend full days with breaks as needed to control symptoms.   May require shortened days.   Work from home recommended when able.  Recommendations to support recovery:   Avoid carrying heavy items.   Bring snacks and water bottle into workspace.  Recommend intermittent leisuerly walks outside during the day when able.  Allow visual supports:   Allow temporary screen modifications such as increased font, decreased brightness on screens.   Use of sunglasses / blue light filtering glasses as needed.   Sit near natural light sources when able, avoid artificial lighting.    Annaliah Rivenbark nP  07/07/18    **This form expires within 2 weeks - return to clinic for updated instructions as needed.**

## 2018-07-07 ENCOUNTER — Ambulatory Visit: Payer: No Typology Code available for payment source | Attending: Nurse Practitioner

## 2018-07-07 ENCOUNTER — Ambulatory Visit: Payer: No Typology Code available for payment source | Admitting: Nurse Practitioner

## 2018-07-07 DIAGNOSIS — S060X0A Concussion without loss of consciousness, initial encounter: Secondary | ICD-10-CM
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
## 2018-07-12 ENCOUNTER — Ambulatory Visit (INDEPENDENT_AMBULATORY_CARE_PROVIDER_SITE_OTHER): Payer: No Typology Code available for payment source | Admitting: Family Medicine

## 2018-07-12 ENCOUNTER — Encounter (INDEPENDENT_AMBULATORY_CARE_PROVIDER_SITE_OTHER): Payer: Self-pay | Admitting: Family Medicine

## 2018-07-12 VITALS — BP 152/91 | HR 49 | Temp 98.4°F | Resp 14 | Ht 62.6 in | Wt 138.6 lb

## 2018-07-12 DIAGNOSIS — L987 Excessive and redundant skin and subcutaneous tissue: Secondary | ICD-10-CM

## 2018-07-12 DIAGNOSIS — R030 Elevated blood-pressure reading, without diagnosis of hypertension: Secondary | ICD-10-CM

## 2018-07-12 DIAGNOSIS — M21372 Foot drop, left foot: Secondary | ICD-10-CM

## 2018-07-12 DIAGNOSIS — S060X0D Concussion without loss of consciousness, subsequent encounter: Secondary | ICD-10-CM

## 2018-07-12 DIAGNOSIS — N950 Postmenopausal bleeding: Secondary | ICD-10-CM

## 2018-07-12 DIAGNOSIS — R9089 Other abnormal findings on diagnostic imaging of central nervous system: Secondary | ICD-10-CM

## 2018-07-12 DIAGNOSIS — N6481 Ptosis of breast: Secondary | ICD-10-CM

## 2018-07-12 NOTE — Progress Notes (Signed)
Re: Initial visit to concussion clinic 07/07/18 with Gwyndolyn Kaufman FNP    Review of [facility] charts:  I, Roderic Palau MD, have reviewed the history, physical exam, evaluation, clinical impression and plan and agree.
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
## 2018-07-12 NOTE — Progress Notes (Signed)
Have you seen any specialists/other providers since your last visit with [place]?    Yes, GI specialist,     Arm preference verified?   Yes    The patient is due for [facility], [facility]

## 2018-07-14 ENCOUNTER — Telehealth (INDEPENDENT_AMBULATORY_CARE_PROVIDER_SITE_OTHER): Payer: Self-pay | Admitting: Family Medicine

## 2018-07-14 ENCOUNTER — Inpatient Hospital Stay: Payer: No Typology Code available for payment source | Attending: Family Medicine

## 2018-07-14 DIAGNOSIS — M545 Low back pain, unspecified: Secondary | ICD-10-CM

## 2018-07-14 DIAGNOSIS — M6281 Muscle weakness (generalized): Secondary | ICD-10-CM | POA: Insufficient documentation

## 2018-07-14 DIAGNOSIS — G8929 Other chronic pain: Secondary | ICD-10-CM | POA: Insufficient documentation

## 2018-07-14 DIAGNOSIS — R2689 Other abnormalities of gait and mobility: Secondary | ICD-10-CM | POA: Insufficient documentation

## 2018-07-14 DIAGNOSIS — M21372 Foot drop, left foot: Secondary | ICD-10-CM

## 2018-07-14 NOTE — Telephone Encounter (Signed)
Patient stated that the PT office is requesting that patient get MRI Lower Back and MRI L-Foot. If okay, please place the orders and I will inform patient.

## 2018-07-14 NOTE — Progress Notes (Signed)
Name:Tracy [name] Age: 53 y.o.   Date of Service: 07/14/2018  Referring Physician: Harland German,*   Date of Injury: 04/07/2018  Date Care Plan Established/Reviewed: 07/14/2018  Date Treatment Started: 07/14/2018  End of Certification Date: 09/11/2018  Sessions in Plan of Care: 16  Surgery Date: No data was found      Visit Count: 1   Diagnosis:   1. Chronic low back pain, unspecified back pain laterality, unspecified whether sciatica present    2. Muscle weakness    3. Functional gait abnormality        Subjective     History of Present Illness   History of Present Illness: "I started to get some L hip and low back pain around April. I noticed the foot drop around the same time. The foot started to get weak and swell up on me. The swelling is okay in the morning and gets worse in the morning. I don't have any pain in the low back right now. I do get tingling and numbness in the lower leg with certain movements or when my leg is hanging. Back pain isn't new for me. I've had it off and on for a while now, but I've never had this foot drop."  Functional Limitations (PLOF): Increased fatigued in the L LE when walking more than 20 min (Pt able to walk 5-7 miles without any issues)  Pt unable to properly negotiate stairs due to L foot drop (No issues with stair negotiation prior to onset)    Outcome Measure   Tool Used/Details: FOTO  Score: 36  Predicted Functional Outcome: 64      Precautions: No data was found  Allergies: Bee pollen and Vicodin [hydrocodone-acetaminophen]    Past Medical History:   Diagnosis Date   . Anemia    . Arthritis    . Carotid stenosis    . Fatty liver 2017   . GERD (gastroesophageal reflux disease)    . Hepatic cirrhosis    . Hiatal hernia    . Sleep apnea     not using CPAP machine   . Vitamin D deficiency        Objective     Strength     07/14/18   Left Strength  Hip  MMT 07/14/18   Right   4+ pain Hip Flexion 5      Hip Extension       Hip Abduction       Hip Adduction       Hip IR        Hip ER       Quadriceps       Hamstrings     (blank fields were intentionally left blank)    07/14/18   Left Strength  Knee  MMT 07/14/18   Right   5  Knee Flexion 5    5  Knee Extension 5    (blank fields were intentionally left blank)    07/14/18   Left Strength  Ankle/Foot  MMT 07/14/18   Right   1  Ankle Dorsiflexion 5      Ankle Plantarflexion       Ankle Inversion       Ankle Eversion     (blank fields were intentionally left blank)  Mild lateral L hip pain with hip flexion MMT.     Palpation Superior shear of the sacrum w/ B/L knee bend in prone.     Neurological Testing     Sensation  Lumbar   Left   Diminished: light touch    Right   Intact: light touch             Treatment     Therapeutic Exercises   Justification: To educate pt on her POC and improve mobility in the L LE.   Pt educated on her physical therapy POC moving forward. Pt consented to her physical therapy POC.     Seated AAROM L foot dorsiflexion w/ a towel x20. Verbal/tactile cueing for form.    Grasshopper x20 each with verbal/tactile cueing to complete the exercise properly.        ---      ---   Total Time   Timed Minutes  15 minutes   Untimed Minutes  25 minutes   Total Time  40 minutes        Assessment   Tracy [name] is a 53 y.o. female presenting with foot drop and low back pain who requires Physical Therapy for the following:  Impairments:   IPTC Impairments: Pain that limits and interferes with functional ability  Impaired postural alignment  Decreased range of motion  Decreased strength  Decreased functional stability  Decreased joint mobility  Decreased soft tissue mobility  Decreased/impaired motor control     Pain located: lumbar spine, L foot drop    Clinical presentation: stable - predictable recovery pattern  Barriers to therapy: None at this time.     Functional Limitations (PLOF): Increased fatigued in the L LE when walking more than 20 min (Pt able to walk 5-7 miles without any issues)  Pt unable to properly negotiate stairs due to L  foot drop (No issues with stair negotiation prior to onset)  Prognosis: excellent  Plan   Visits per week: 2  Number of Sessions: 16  Direct One on One  40981: Therapeutic Exercise: To Develop Strength and Endurance, ROM and Flexibility  [id number]: Gait Training  19147: Neuromuscular Reeducation (Proprioceptive Neuromuscular Faciliation)  97140: Manual Therapy techniques (mobilization, manipulation, manual traction) (Grade I-V to lumbar spine, pelvic girdle, and regionally interdependent joints, soft tissue mobilization, instrument assisted soft tissue mobilization.)  97530: Therapeutic Activities: Dynamic activities to improve functional performance  Dry Needling  Supervised Modalities  97010: Thermal modalities: hot/cold packs  82956: Mechnical traction  97014: Electrical stimulation  Vitals will be assessed next session.       Goals    Goal 1:  Pt will be proficient with HEP particiaption to help improve ADL completion and decrease functional limitations. Pt's current HEP:    AAROM dorsiflexion w/ towel  Grasshopper    Sessions:  16      Goal 2:  Pt will increase FOTO score to at least 64% to objectively demonstrate improved functional mobility.    Sessions:  16      Goal 3:  Pt will be able to walk at least 45 min with proper gait mechanics to allow for improved ADL completion in her community.    Sessions:  16      Goal 4:  Pt will be able to negotiate at least 1 flight of stairs in a reciprocal pattern with proper mechanics so that she can better navigate her home and community.    Sessions:  16                                Lawernce Ion, PT, DPT

## 2018-07-15 DIAGNOSIS — R2689 Other abnormalities of gait and mobility: Secondary | ICD-10-CM | POA: Insufficient documentation

## 2018-07-15 DIAGNOSIS — M545 Low back pain, unspecified: Secondary | ICD-10-CM | POA: Insufficient documentation

## 2018-07-15 DIAGNOSIS — G8929 Other chronic pain: Secondary | ICD-10-CM | POA: Insufficient documentation

## 2018-07-15 DIAGNOSIS — M6281 Muscle weakness (generalized): Secondary | ICD-10-CM | POA: Insufficient documentation

## 2018-07-16 NOTE — Telephone Encounter (Signed)
Orders placed in the system. Please inform patient

## 2018-07-16 NOTE — Addendum Note (Signed)
Addended by: Harland German F. on: 07/16/2018 01:25 PM     Modules accepted: Orders

## 2018-07-19 ENCOUNTER — Inpatient Hospital Stay: Payer: No Typology Code available for payment source | Attending: Family Medicine

## 2018-07-19 VITALS — BP 154/94 | HR 53

## 2018-07-19 DIAGNOSIS — M6281 Muscle weakness (generalized): Secondary | ICD-10-CM | POA: Insufficient documentation

## 2018-07-19 DIAGNOSIS — R2689 Other abnormalities of gait and mobility: Secondary | ICD-10-CM | POA: Insufficient documentation

## 2018-07-19 DIAGNOSIS — M545 Low back pain, unspecified: Secondary | ICD-10-CM

## 2018-07-19 DIAGNOSIS — G8929 Other chronic pain: Secondary | ICD-10-CM | POA: Insufficient documentation
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
## 2018-07-20 ENCOUNTER — Inpatient Hospital Stay: Payer: No Typology Code available for payment source | Attending: Family Medicine

## 2018-07-20 DIAGNOSIS — M545 Low back pain, unspecified: Secondary | ICD-10-CM

## 2018-07-20 DIAGNOSIS — M6281 Muscle weakness (generalized): Secondary | ICD-10-CM | POA: Insufficient documentation

## 2018-07-20 DIAGNOSIS — R2689 Other abnormalities of gait and mobility: Secondary | ICD-10-CM | POA: Insufficient documentation

## 2018-07-20 DIAGNOSIS — G8929 Other chronic pain: Secondary | ICD-10-CM | POA: Insufficient documentation

## 2018-07-20 NOTE — PT/OT Therapy Note (Signed)
Name: Tracy [name] Age: 53 y.o.   Date of Service: 07/20/2018  Referring Physician: Harland German,*   Date of Injury: 04/07/2018  Date Care Plan Established/Reviewed: 07/14/2018  Date Treatment Started: 07/14/2018  End of Certification Date: 09/11/2018  Sessions in Plan of Care: 16  Surgery Date: No data was found    Visit Count: 3   Diagnosis:   1. Chronic low back pain, unspecified back pain laterality, unspecified whether sciatica present    2. Functional gait abnormality    3. Muscle weakness        Subjective     "I felt good after last session. I have been working on trying to decrease tension in the glutes. I didn't have any soreness after yesterday."       Precautions: No data was found  Allergies: Bee pollen and Vicodin [hydrocodone-acetaminophen]                Treatment     Therapeutic Exercises   Justification: To educate pt fully on her current issues, ensure HEP compliance, and improve mobility.     Pt educated on anatomical structures that are relevant to her current issues.    Review of current HEP with verbal cueing to help improve form.     Supine APT/PPT 15x5" with verbal/tactile cueing for form.    Neuromuscular Re-Education   Justification: To help improve core engagement.   Pt educated on proper TA activation with verbal cueing to not let her fingers sink into her stomach.Extensive time taken to help pt understand the exercise.    Chop pattern used for irradiation from the UE's into the core to help improve core engagement.     Manual Therapy   Justification: To help improve lumbar mobility and decrease myofascial restrictions.   STM to the L piriformis    Sciatic nerve tracing throughout the L LE.     BCC of the L ischial tuberosity.    Grade IV L side bending lumbar mobilizations.        ---      ---   Total Time   Timed Minutes  40 minutes   Total Time  40 minutes        Assessment   No changes with L ankle dorsiflexion AROM post tx. Pt was able to complete all exercises without pain  in the low back. Pt struggled with TA activation and was not able to maintain activation past a few seconds. Further education on this will be needed.  Continued core strengthening will be beneficial moving forward.   Plan   Continue per POC. See above.       Goals    Goal 1:  Pt will be proficient with HEP particiaption to help improve ADL completion and decrease functional limitations. Pt's current HEP:    AAROM dorsiflexion w/ towel  Grasshopper   Single LE ab series  --07/19/18 OE   Sessions:  16   Progression:  progressing      Goal 2:  Pt will increase FOTO score to at least 64% to objectively demonstrate improved functional mobility.    Sessions:  16      Goal 3:  Pt will be able to walk at least 45 min with proper gait mechanics to allow for improved ADL completion in her community.    Sessions:  16      Goal 4:  Pt will be able to negotiate at least 1 flight of stairs in a reciprocal pattern  with proper mechanics so that she can better navigate her home and community.    Sessions:  16                                Lawernce Ion, PT, DPT

## 2018-07-22 ENCOUNTER — Encounter: Payer: Self-pay | Admitting: Nurse Practitioner

## 2018-07-23 ENCOUNTER — Encounter (INDEPENDENT_AMBULATORY_CARE_PROVIDER_SITE_OTHER): Payer: Self-pay | Admitting: Family Medicine

## 2018-07-27 ENCOUNTER — Inpatient Hospital Stay: Payer: No Typology Code available for payment source

## 2018-07-27 ENCOUNTER — Telehealth: Payer: Self-pay

## 2018-07-27 ENCOUNTER — Encounter (INDEPENDENT_AMBULATORY_CARE_PROVIDER_SITE_OTHER): Payer: Self-pay

## 2018-07-27 ENCOUNTER — Telehealth (INDEPENDENT_AMBULATORY_CARE_PROVIDER_SITE_OTHER): Payer: Self-pay | Admitting: Family Medicine

## 2018-07-27 NOTE — Telephone Encounter (Signed)
Pt needs to verify if the two orders for the MRI has been sent to the office and would like to know which facility she' supposed to go to .  Pls clarify and advise.      Pt info      [phone] (M)    Thank you

## 2018-07-27 NOTE — Telephone Encounter (Signed)
My chart message sent to inform patient of the phone number for [facility].

## 2018-08-04 ENCOUNTER — Encounter (INDEPENDENT_AMBULATORY_CARE_PROVIDER_SITE_OTHER): Payer: Self-pay

## 2018-08-04 ENCOUNTER — Telehealth (INDEPENDENT_AMBULATORY_CARE_PROVIDER_SITE_OTHER): Payer: No Typology Code available for payment source | Admitting: Vascular Neurology

## 2018-08-04 ENCOUNTER — Encounter (INDEPENDENT_AMBULATORY_CARE_PROVIDER_SITE_OTHER): Payer: Self-pay | Admitting: Vascular Neurology

## 2018-08-04 VITALS — Ht 62.5 in | Wt 138.0 lb

## 2018-08-04 DIAGNOSIS — Z8669 Personal history of other diseases of the nervous system and sense organs: Secondary | ICD-10-CM | POA: Insufficient documentation

## 2018-08-04 DIAGNOSIS — R519 Headache, unspecified: Secondary | ICD-10-CM

## 2018-08-04 DIAGNOSIS — Z87820 Personal history of traumatic brain injury: Secondary | ICD-10-CM | POA: Insufficient documentation

## 2018-08-04 DIAGNOSIS — Z79899 Other long term (current) drug therapy: Secondary | ICD-10-CM

## 2018-08-04 DIAGNOSIS — R51 Headache: Secondary | ICD-10-CM

## 2018-08-04 DIAGNOSIS — R41840 Attention and concentration deficit: Secondary | ICD-10-CM

## 2018-08-04 DIAGNOSIS — R9082 White matter disease, unspecified: Secondary | ICD-10-CM

## 2018-08-04 DIAGNOSIS — G479 Sleep disorder, unspecified: Secondary | ICD-10-CM

## 2018-08-04 DIAGNOSIS — R6889 Other general symptoms and signs: Secondary | ICD-10-CM

## 2018-08-04 NOTE — Patient Instructions (Signed)
Our plan:     MRI brain, if possible bring CD from 2017 study with you to radiology center so they can compare  Check updated home sleep study.  Can try for migraine prevention:    Magnesium oxide 250-400mg  once or twice a day  Riboflavin (B2) 200-400mg  once a day  Co Q 300 mg once a day        Today's Visit:      In today's visit, I reviewed your medications and records relating your health - prior testing, blood work, reports of other health care providers present in your electronic medical record.     If you have pertinent records from any non-[facility] doctors that you would like to review, please have them sent to [place] or bring to them to your next office visit.     A copy of today's visit will be sent to your referring doctor and/or primary care doctor, if you have one listed in our system.    Let me know if there are things we could have done better for your office visit.    Patient satisfaction survey:      If you receive a patient satisfaction survey, I would greatly appreciate it if you would complete it. We value your feedback.     Contact me online:      Patient Portal online - Please sign up for MyChart -- this is the best way to communicate with your team here.  There is a messaging feature, where you can [place] messages anytime of day.  It is the best way to communicate with [place] and get test results, medication refills, or ask questions.     You can expect to get a response within 24-48 hours during weekdays.  If you do not receive a timely response, resend your request and inform us. My goal is for every question answered ever day.  Average response for a phone call maybe 3 days due to the volume we receive (which is why MyChart is preferred).    If you are having a medical emergency -- call 911, DO NOT SEND A MESSAGE THROUGH MYCHART.     Coupons for medication:      If you have any trouble affording your medications, check out www.goodrx.com for coupons and competitive prices in your area.  If you need  further assistance, let us know so we can work with you and your insurance to make sure you get the best care.    Thank you for trusting me with your health.      Take care,     Artemio Aly, MD  [facility]

## 2018-08-04 NOTE — Progress Notes (Signed)
TELEMEDICINE APPOINTMENT    Subjective:      Verbal consent was obtained from the patient for this telemedicine appointment, secondary to ongoing concerns regarding the COVID-19 pandemic.    Patient ID: Tracy [name]  CC:   Chief Complaint   Patient presents with   . Concussion       HPI  This is a 53 year old woman who is referred for evaluation.  She had a concussion several weeks ago, after hitting her head on the hatch of her SUV.  She did not lose consciousness, but she was dazed for a short period of time and had a headache.  Since the concussion she has had headaches off and on as well as with some memory problems difficulty focusing.  She says she had an instant headache after the impact, and it has occurred intermittently since then but can be severe.  Nothing takes away the pain.  She is tried over-the-counter medications, and was given pain pills but the headaches remain severe.  She has nausea, photophobia and phonophobia.  The headaches are often in the front and occipital regions.  She does have a prior history of chronic sinus problems.    She has been more forgetful and have had difficulty focusing.  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  She has not been sleeping well, and reports difficulty falling asleep.  This has been somewhat different since the concussion.  If she sleeps on her left side she gets headaches.    She has not been able to tolerate working on a screen and has been wearing bluelight blocking glasses.    There is a history of chronic migraines.    She has a history of low back pain and foot drop, and has an MRI scheduled for that.    She had a history of an MRI done a few years ago, that she was told was abnormal.    She also has a history of mild sleep apnea, and was previously using CPAP.  She says she has lost about 100 pounds, and has not been using the CPAP for a while and is uncertain  if she still has sleep apnea.    Results for orders placed or performed during the hospital encounter of 07/03/18   CT Head without Contrast    Narrative    CT HEAD WO CONTRAST    CLINICAL INDICATION:   r/o ICH    TECHNIQUE: Noncontrast serial axial images skull base to vertex with  coronal and sagittal reconstruction.  Note that CT scanning at this site utilizes multiple dose reduction  techniques including automatic exposure control, adjustment of the MAA  and/or KVP according to the patient's size and use of iterative  reconstruction technique.    COMPARISON: None available    FINDINGS: There is no mass effect or midline shift. There are no  abnormal intra or extra-axial fluid collections. Cortical sulci and  lateral ventricles are within normal limits for size and configuration.  The perimesencephalic cistern is patent. No acute intracranial  hemorrhage is identified.  Visualized paranasal sinuses and mastoid air cells are well aerated.  No acute osseous abnormality is identified.      Impression    1.  No acute intracranial hemorrhage is identified.    Tana Felts, MD   07/03/2018 9:17 PM     MRI brain [facility] 11/2015  Result Impression  1. No acute intracranial abnormality.  2. Small focus of T2/FLAIR hyperintensity in the posterior right frontal white matter is nonspecific, but most likely represents gliosis from prior insult. Otherwise, no significant white matter disease for patient's age.  3. Minimal inflammatory sinus mucosal thickening in the left sphenoid sinus.         The following portions of the patient's history were reviewed and updated as appropriate: allergies, current medications, past family history, past medical history, past social history, past surgical history and problem list.     Review of Systems    Constitutional: Negative for fever.   Respiratory: Negative for shortness of breath.    Cardiovascular: Negative for chest pain.   Gastrointestinal: Negative for abdominal pain.    Neurological: Negative for dizziness, weakness, numbness.   Psychiatric/Behavioral: Negative for sleep disturbance.   All other systems reviewed and are negative except pertinent positives as noted above in HPI.         Objective:     Vitals: Reviewed.   Visit Vitals  Ht 1.588 m (5' 2.5")   Wt 62.6 kg (138 lb)   BMI 24.84 kg/m     Respiratory rate appears normal.    General: WDWN, NAD. Vital signs reviewed.  HEENT: No nasal discharges, no obvious oral masses. No icterus or conjunctival hemorrhage noted.  Neck: Full ROM. No neck masses noted.  Extremities: No edema noted in the hands.   Skin: No obvious rashes or discoloration.  Chest:  No audible wheezing or dyspnea at rest.  Abdomen: No distention, no tenderness or guarding to patient palpation.  Psychiatric: Cooperative with exam, normal affect. Thought process and speech pattern normal.    NEUROLOGICAL EXAM    Mental Status: Awake, alert, oriented. Speech fluent without dysarthria. Follows commands well. Attention, memory, and concentration intact. Fund of knowledge appropriate for education.     Cranial Nerves: Pupils equally round. Extraocular movements are full. No nystagmus seen. Facial sensation to light touch appears intact. Face is symmetric. Hearing is intact to conversational speech. Tongue protrudes midline. Cranial nerves II-XII otherwise appears intact.     Motor: Moves all extremities well, without UE drift. No obvious focal weakness noted. Bulk appears normal. No tremor noted.    Sensation: Light touch intact.     Coordination: Finger to nose intact without dysmetria.     Gait: Normal and steady. Romberg negative. Marches in place.      Assessment:       1. Hx of concussion-recent relatively severe incident   2. Frontal headache-recent severe symptoms following head injury   3. Occipital headache    4. Hx of migraine headaches    5. Forgetfulness- persistent severe symptoms   6. Difficulty concentrating-persistent severe symptoms   7. Sleep  difficulties    8. Medication management    9. White matter abnormality on MRI of brain    10. Hx of sleep apnea          Plan:      1.  Check updated MRI brain without contrast, compare with prior 2017 study with regards to any new white matter changes.  2.  Check updated home sleep study.  3.  Can try magnesium, B2, coenzyme Q for headache prevention.  4.  I encouraged her to try make sure she gets at least 7 or 8 hours of sleep per night.  5.  Can use over-the-counter analgesics as needed for headaches for now though I cautioned her against excessive use to reduce  the chance of rebound headache.  6.  If cognitive issues and forgetfulness continue, consider lab testing but suspect this is a combination of concussion and sleep issues and stress.  7.  Consider treatment options pending results of sleep study.  8.  Follow-up in 1 month for reassessment.      The patient will return for follow-up as outlined above. If there are any problems with medications (if prescribed) or questions about any available test results, or if there are any new symptoms or changes in current symptoms, the patient is instructed to call the office.         Sargon Scouten B. Stacy Gardner, MD  Director, [facility]  Co-Director, [facility], Neurology  Board Certified, Sleep Medicine  Board Certified, Clinical Neurophysiology  Board Certified, Vascular Neurology        *This note was generated by the Epic EMR system/ Dragon speech recognition and may contain inherent errors or omissions not intended by the user. Grammatical errors, random word insertions, deletions, pronoun errors and incomplete sentences are occasional consequences of this technology due to software limitations. Not all errors are caught or corrected. If there are questions or concerns about the content of this note or information contained within the body of this dictation they should be addressed directly  with the author for clarification.*

## 2018-08-09 ENCOUNTER — Telehealth: Payer: Self-pay

## 2018-08-09 ENCOUNTER — Inpatient Hospital Stay: Payer: No Typology Code available for payment source

## 2018-08-09 NOTE — Telephone Encounter (Signed)
Called pt per no show protocol. Spoke with pt who states that doctor still has not given her clearance for MRI yet so as discussed with last PT she would not be coming in until imaging was performed and read in order to conserve PT visits (Pt has limited number/year). cancelled today's appt as well as appt on 08/12/18. Reminded pt of next scheduled appt on 08/16/18.    Cesar Rogerson PT, DPT  08/09/2018  6:00 PM

## 2018-08-09 NOTE — PT/OT Therapy Note (Deleted)
Name:Tracy [name] Age: 53 y.o.   Date of Service: 08/09/2018  Referring Physician: Harland German *   Date of Injury: No data was found  Date Care Plan Established/Reviewed: No data was found  Date Treatment Started: No data was found  End of Certification Date: No data was found  Sessions in Plan of Care: No data was found  Surgery Date: No data was found      Visit Count: Visit count could not be calculated. Make sure you are using a visit which is associated with an episode.   Diagnosis: No diagnosis found.         Precautions: No data was found  Allergies: Bee pollen and Vicodin [hydrocodone-acetaminophen]    Past Medical History:   Diagnosis Date   . Anemia    . Arthritis    . Carotid stenosis    . Fatty liver 2017   . GERD (gastroesophageal reflux disease)    . Headache    . Hepatic cirrhosis    . Hiatal hernia    . Sleep apnea     not using CPAP machine   . Vitamin D deficiency                    Treatment     Neuromuscular Re-Education   Pelvic AE with traction applied to LE in flex/ adduction/ IR/ DF/ Inv until "magnetic click" felt, progressed to pelvic AE prolonged holds with COI of hip flex, progressed to pelvic AE COI to  improve core initiation and neuromuscular firing to improve stepping ability    Manual Therapy   In s/l  STM to SF over sacrum, l/s paraspinals, QL, l/s paraspinals     BCC sacral sulci, SIJ, iliac crest, spinal grooves     Grade 4 inf glide to sacrum with clamshell    Grade 4 PA to sacrum on  L with clamshell                Caleah Tortorelli, PT

## 2018-08-12 ENCOUNTER — Inpatient Hospital Stay: Payer: No Typology Code available for payment source

## 2018-08-16 ENCOUNTER — Telehealth: Payer: Self-pay

## 2018-08-16 ENCOUNTER — Inpatient Hospital Stay: Payer: No Typology Code available for payment source

## 2018-08-16 NOTE — Telephone Encounter (Signed)
Called pt per no show protocol. Spoke with pt who stated that she was still waiting to have MRI so unable to attend today's appt. Pt requested to cancel next scheduled appt on 8/13. Pt's MRI scheduled on 08/21/18. Informed pt of next appt after MRI on 08/23/18 @ 5:30 pm pt stated that she would be at this appt.      Shirlyn Savin PT, DPT  08/16/2018  6:07 PM

## 2018-08-18 ENCOUNTER — Ambulatory Visit: Payer: No Typology Code available for payment source | Attending: Vascular Neurology

## 2018-08-18 DIAGNOSIS — G479 Sleep disorder, unspecified: Secondary | ICD-10-CM | POA: Insufficient documentation

## 2018-08-18 DIAGNOSIS — R0683 Snoring: Secondary | ICD-10-CM

## 2018-08-18 DIAGNOSIS — Z8669 Personal history of other diseases of the nervous system and sense organs: Secondary | ICD-10-CM | POA: Insufficient documentation

## 2018-08-19 ENCOUNTER — Inpatient Hospital Stay: Payer: No Typology Code available for payment source

## 2018-08-21 ENCOUNTER — Ambulatory Visit
Admission: RE | Admit: 2018-08-21 | Discharge: 2018-08-21 | Disposition: A | Discharge status: Home / Self Care | Payer: No Typology Code available for payment source | Source: Ambulatory Visit | Attending: Family Medicine | Admitting: Family Medicine

## 2018-08-21 DIAGNOSIS — G8929 Other chronic pain: Secondary | ICD-10-CM | POA: Insufficient documentation

## 2018-08-21 DIAGNOSIS — M21372 Foot drop, left foot: Secondary | ICD-10-CM | POA: Insufficient documentation

## 2018-08-21 DIAGNOSIS — M545 Low back pain, unspecified: Secondary | ICD-10-CM

## 2018-08-21 DIAGNOSIS — M4686 Other specified inflammatory spondylopathies, lumbar region: Secondary | ICD-10-CM | POA: Insufficient documentation

## 2018-08-23 ENCOUNTER — Inpatient Hospital Stay: Payer: No Typology Code available for payment source

## 2018-08-23 ENCOUNTER — Telehealth: Payer: Self-pay | Admitting: Family Medicine

## 2018-08-23 NOTE — Telephone Encounter (Signed)
Patient is calling for an update on the status of MRI RESULTS.  Please return call at noted phone number below.      Patient Preferred Callback Number: [phone]    Pt stated she needs to know results before her physical therapy appt that is today at 5:30 p.m.

## 2018-08-23 NOTE — Progress Notes (Deleted)
Name:Tracy [name] Age: 53 y.o.   Date of Service: 08/23/2018  Referring Physician: Harland German *   Date of Injury: No data was found  Date Care Plan Established/Reviewed: No data was found  Date Treatment Started: No data was found  End of Certification Date: No data was found  Sessions in Plan of Care: No data was found  Surgery Date: No data was found      Visit Count: Visit count could not be calculated. Make sure you are using a visit which is associated with an episode.   Diagnosis: No diagnosis found.    Subjective       Functional Limitations (PLOF):   Increased fatigued in the L LE when walking more than 20 min (Pt able to walk 5-7 miles without any issues)  Pt unable to properly negotiate stairs due to L foot drop (No issues with stair negotiation prior to onset)      Precautions: No data was found  Allergies: Bee pollen and Vicodin [hydrocodone-acetaminophen]    Past Medical History:   Diagnosis Date   . Anemia    . Arthritis    . Carotid stenosis    . Fatty liver 2017   . GERD (gastroesophageal reflux disease)    . Headache    . Hepatic cirrhosis    . Hiatal hernia    . Sleep apnea     not using CPAP machine   . 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 NO HERNIATION. NO STENOSIS.    Assess LE MMT   Ankle DF AROM                            Erisha Paugh, PT

## 2018-08-25 NOTE — Telephone Encounter (Signed)
Provider spoke with patient 08/24/18

## 2018-08-26 ENCOUNTER — Inpatient Hospital Stay: Payer: No Typology Code available for payment source

## 2018-08-30 ENCOUNTER — Inpatient Hospital Stay: Payer: No Typology Code available for payment source

## 2018-08-31 NOTE — Progress Notes (Signed)
HST of adequate duration completed. Pt educated on use of device and demonstrated understanding

## 2018-09-02 ENCOUNTER — Inpatient Hospital Stay
Payer: No Typology Code available for payment source | Attending: Family Medicine | Admitting: Rehabilitative and Restorative Service Providers"

## 2018-09-02 DIAGNOSIS — M545 Low back pain, unspecified: Secondary | ICD-10-CM

## 2018-09-02 DIAGNOSIS — G8929 Other chronic pain: Secondary | ICD-10-CM | POA: Insufficient documentation

## 2018-09-02 DIAGNOSIS — M6281 Muscle weakness (generalized): Secondary | ICD-10-CM | POA: Insufficient documentation

## 2018-09-02 DIAGNOSIS — R2689 Other abnormalities of gait and mobility: Secondary | ICD-10-CM | POA: Insufficient documentation

## 2018-09-02 NOTE — PT/OT Therapy Note (Signed)
Name: Tracy [name] Age: 53 y.o.   Date of Service: 09/02/2018  Referring Physician: Harland German *   Date of Injury: 04/07/2018  Date Care Plan Established/Reviewed: 07/14/2018  Date Treatment Started: 07/14/2018  End of Certification Date: 09/11/2018  Sessions in Plan of Care: 16  Surgery Date: No data was found    Visit Count: 4   Diagnosis:   1. Chronic low back pain, unspecified back pain laterality, unspecified whether sciatica present    2. Functional gait abnormality    3. Muscle weakness        Subjective     "I got my MRI back and they didn't find anything. I did notice that the side of the L leg is numb to the touch and the skin looks a little dark at there too. I think it's all coming from this part of the leg, not the back. The lower L leg also feels cooler than the R. I don't know why."       Precautions: No data was found  Allergies: Bee pollen and Vicodin [hydrocodone-acetaminophen]    Objective   Very faint pedal pulse on the L compared to the strong pedal pulse on the R.     Moderate atrophy of the L peroneals.     L fibular head sheared anteriorly.              Treatment     Therapeutic Exercises   Justification: To educate pt fully on her current issues and ensure HEP compliance.  Pt educated on anatomical structures that are relevant to her current issues.    Review of current HEP with verbal cueing to help improve form.     Discussed the possibility of getting an EMG study completed on the L LE. Pt stated that she will ask her MD about it.     Neuromuscular Re-Education   Justification: To help improve L ankle DF engagement.   Prolonged hold of the L ankle into DF with COI of the L ankle into DF/PF. Extensive time taken to complete this.     Manual Therapy   Justification: To help improve L fibular head positioning and decrease compression on the L common fibular nerve.   Grade III-IV ER mobilization of the L tibia.     STM to the L peroneals with strumming and sustained pressure  techniques.    BCC of the L fibular head    Grade III-IV AP of the L fibular head.        ---      ---   Total Time   Timed Minutes  41 minutes   Total Time  41 minutes        Assessment   Pt demonstrated improved L DF AROM with increased strength (2+/5) post tx. Pt may be experiencing compression on the L common fibular nerve that is causing the L foot drop. Further assessment of this peripheral nerve would be beneficial moving forward.   Plan   Reassess L fibula  NMR to improve L DF      Goals    Goal 1:  Pt will be proficient with HEP particiaption to help improve ADL completion and decrease functional limitations. Pt's current HEP:    AAROM dorsiflexion w/ towel  Grasshopper   Single LE ab series  --07/19/18 OE   Sessions:  16   Progression:  progressing      Goal 2:  Pt will increase FOTO score to at least 64%  to objectively demonstrate improved functional mobility.    Sessions:  16      Goal 3:  Pt will be able to walk at least 45 min with proper gait mechanics to allow for improved ADL completion in her community.    Sessions:  16      Goal 4:  Pt will be able to negotiate at least 1 flight of stairs in a reciprocal pattern with proper mechanics so that she can better navigate her home and community.    Sessions:  16                                Sunny Schlein, DPT

## 2018-09-03 NOTE — Procedures (Signed)
Service Date: 08/19/2018     Patient Type: O     PHYSICIAN/PROVIDER: Campbell Lerner MD     REFERRING PHYSICIAN:      REFERRING PHYSICIAN:  Artemio Aly, MD     INDICATION:  Sleep difficulties.     SUMMARY OF FINDINGS:  The total study time was 6 hours and 27 minutes with a sleep time of 5  hours and 13 minutes.  The percent of time spent in REM sleep was 10.5%.   The apnea-hypopnea index was 2.0 with a respiratory disturbance index of  3.58.  The oxygen saturation index was 2.2.  Minimum oxygen saturation was  91% with a mean oxygen saturation of 95%.  The mean pulse rate was 60.  The  patient primarily slept on the right side, but slept in the supine, prone  position as well.  The percent of sleep spent snoring at decibel level of  greater than 40 dB was 1.9%.     IMPRESSION:  Overall, normal-appearing home sleep study without evidence for significant  sleep disturbed-breathing or sleep apnea.  Minimal primary snoring was  seen.     Thank you for referring this patient for evaluation.  Correlation is  recommended.                    D:  09/02/2018 21:18 PM by Dr. Minerva Areola B. Stacy Gardner, MD ([id number])  T:  09/03/2018 09:03 AM by NTS      Everlean Cherry: [id number]) (Doc ID: [id number])

## 2018-09-04 ENCOUNTER — Ambulatory Visit: Payer: No Typology Code available for payment source

## 2018-09-06 ENCOUNTER — Inpatient Hospital Stay: Payer: No Typology Code available for payment source | Admitting: Rehabilitative and Restorative Service Providers"

## 2018-09-06 DIAGNOSIS — Z029 Encounter for administrative examinations, unspecified: Secondary | ICD-10-CM | POA: Insufficient documentation

## 2018-09-07 ENCOUNTER — Other Ambulatory Visit (INDEPENDENT_AMBULATORY_CARE_PROVIDER_SITE_OTHER): Payer: Self-pay | Admitting: Vascular Neurology

## 2018-09-07 DIAGNOSIS — G43009 Migraine without aura, not intractable, without status migrainosus: Secondary | ICD-10-CM

## 2018-09-07 DIAGNOSIS — R413 Other amnesia: Secondary | ICD-10-CM

## 2018-09-08 ENCOUNTER — Encounter (INDEPENDENT_AMBULATORY_CARE_PROVIDER_SITE_OTHER): Payer: Self-pay

## 2018-09-08 ENCOUNTER — Other Ambulatory Visit (INDEPENDENT_AMBULATORY_CARE_PROVIDER_SITE_OTHER): Payer: Self-pay | Admitting: "Endocrinology

## 2018-09-09 ENCOUNTER — Encounter (INDEPENDENT_AMBULATORY_CARE_PROVIDER_SITE_OTHER): Payer: Self-pay

## 2018-09-22 ENCOUNTER — Ambulatory Visit
Admission: RE | Admit: 2018-09-22 | Discharge: 2018-09-22 | Disposition: A | Discharge status: Home / Self Care | Payer: No Typology Code available for payment source | Source: Ambulatory Visit | Attending: Vascular Neurology | Admitting: Vascular Neurology

## 2018-09-22 DIAGNOSIS — G43009 Migraine without aura, not intractable, without status migrainosus: Secondary | ICD-10-CM | POA: Insufficient documentation

## 2018-09-22 DIAGNOSIS — R9089 Other abnormal findings on diagnostic imaging of central nervous system: Secondary | ICD-10-CM | POA: Insufficient documentation

## 2018-09-22 DIAGNOSIS — R413 Other amnesia: Secondary | ICD-10-CM | POA: Insufficient documentation

## 2018-09-22 MED ORDER — GADOBUTROL 1 MMOL/ML IV SOLN
6.00 mL | Freq: Once | INTRAVENOUS | Status: AC | PRN
Start: 2018-09-22 — End: 2018-09-22
  Administered 2018-09-22: 18:00:00 6 mmol via INTRAVENOUS

## 2018-09-29 ENCOUNTER — Inpatient Hospital Stay: Payer: No Typology Code available for payment source | Admitting: Rehabilitative and Restorative Service Providers"

## 2018-10-02 ENCOUNTER — Encounter (INDEPENDENT_AMBULATORY_CARE_PROVIDER_SITE_OTHER): Payer: Self-pay | Admitting: Vascular Neurology

## 2018-10-06 ENCOUNTER — Other Ambulatory Visit (INDEPENDENT_AMBULATORY_CARE_PROVIDER_SITE_OTHER): Payer: Self-pay | Admitting: "Endocrinology

## 2018-10-06 DIAGNOSIS — E890 Postprocedural hypothyroidism: Secondary | ICD-10-CM

## 2018-10-06 NOTE — Telephone Encounter (Signed)
Please advise that Rx for armour was sent to the pharmacy as per pharmacy request and for Tracy [name] to schedule a follow up visit with labs 1-2 days prior.  Labs ordered and are in her chart. Thanks

## 2018-10-12 ENCOUNTER — Inpatient Hospital Stay: Payer: No Typology Code available for payment source | Admitting: Physical Therapist

## 2018-10-14 ENCOUNTER — Encounter (INDEPENDENT_AMBULATORY_CARE_PROVIDER_SITE_OTHER): Payer: Self-pay

## 2018-10-14 ENCOUNTER — Inpatient Hospital Stay: Payer: No Typology Code available for payment source | Admitting: Rehabilitative and Restorative Service Providers"

## 2018-10-19 ENCOUNTER — Inpatient Hospital Stay: Payer: No Typology Code available for payment source | Admitting: Physical Therapist

## 2018-10-21 ENCOUNTER — Inpatient Hospital Stay: Payer: No Typology Code available for payment source | Admitting: Rehabilitative and Restorative Service Providers"

## 2018-10-26 ENCOUNTER — Inpatient Hospital Stay: Payer: No Typology Code available for payment source | Admitting: Physical Therapist

## 2018-10-28 ENCOUNTER — Inpatient Hospital Stay: Payer: No Typology Code available for payment source | Admitting: Rehabilitative and Restorative Service Providers"

## 2018-11-04 ENCOUNTER — Telehealth (INDEPENDENT_AMBULATORY_CARE_PROVIDER_SITE_OTHER): Payer: Self-pay | Admitting: Family Medicine

## 2018-11-04 NOTE — PT/OT Therapy Note (Signed)
Name: Tracy [name] Age: 53 y.o.   Date of Service: 09/02/2018  Referring Physician: Harland German *   Date of Injury: 04/07/2018  Date Care Plan Established/Reviewed: 07/14/2018  Date Treatment Started: 07/14/2018  End of Certification Date: 09/11/2018  Sessions in Plan of Care: 16  Surgery Date: No data was found    Visit Count: 4   Diagnosis:   1. Chronic low back pain, unspecified back pain laterality, unspecified whether sciatica present    2. Functional gait abnormality    3. Muscle weakness          Discontinuation of Therapy Services    Tracy [name] did not complete prescribed physical therapy visits.      Status is unknown at this time, physical therapy has been discontinued and patient has been discharged from care.  The last therapy note is below for review.    Please feel free to contact me with any questions regarding the care of Tracy [name].    Sincerely,    Lawernce Ion, PT, DPT        11/04/2018            Precautions: No data was found  Allergies: Bee pollen and Vicodin [hydrocodone-acetaminophen]                      ---      ---   Total Time   Timed Minutes  41 minutes   Total Time  41 minutes           Goals    Goal 1: Pt will be proficient with HEP particiaption to help improve ADL completion and decrease functional limitations. Pt's current HEP:    AAROM dorsiflexion w/ towel  [place]pper   Single LE ab series  --07/19/18 OE   Sessions: 16   Progression: progressing      Goal 2: Pt will increase FOTO score to at least 64% to objectively demonstrate improved functional mobility.    Sessions: 16      Goal 3: Pt will be able to walk at least 45 min with proper gait mechanics to allow for improved ADL completion in her community.    Sessions: 16      Goal 4: Pt will be able to negotiate at least 1 flight of stairs in a reciprocal pattern with proper mechanics so that she can better navigate her home and community.    Sessions: 16                                Sunny Schlein,  DPT

## 2018-11-04 NOTE — Telephone Encounter (Signed)
Patient called concerned because she is having shortness of breath that's been getting worse in the last 3 days, wheezing, using the inhaler, not helping much. Feels her chest tight and hurts, upper back pain, loss of taste, dry cough for 1 week. Unable to tell if she is having fever, because she has hot flashes all the time. Patient would like to speak to her pcp.     Best number to reach patient [phone]    Thanks,

## 2018-11-05 ENCOUNTER — Telehealth (INDEPENDENT_AMBULATORY_CARE_PROVIDER_SITE_OTHER): Payer: No Typology Code available for payment source | Admitting: Family Medicine

## 2018-11-05 ENCOUNTER — Encounter (INDEPENDENT_AMBULATORY_CARE_PROVIDER_SITE_OTHER): Payer: Self-pay | Admitting: Family Medicine

## 2018-11-05 ENCOUNTER — Other Ambulatory Visit (INDEPENDENT_AMBULATORY_CARE_PROVIDER_SITE_OTHER): Payer: Self-pay | Admitting: "Endocrinology

## 2018-11-05 DIAGNOSIS — Z20828 Contact with and (suspected) exposure to other viral communicable diseases: Secondary | ICD-10-CM

## 2018-11-05 DIAGNOSIS — J209 Acute bronchitis, unspecified: Secondary | ICD-10-CM

## 2018-11-05 DIAGNOSIS — J309 Allergic rhinitis, unspecified: Secondary | ICD-10-CM

## 2018-11-05 DIAGNOSIS — Z20822 Contact with and (suspected) exposure to covid-19: Secondary | ICD-10-CM

## 2018-11-05 DIAGNOSIS — E890 Postprocedural hypothyroidism: Secondary | ICD-10-CM

## 2018-11-05 DIAGNOSIS — J301 Allergic rhinitis due to pollen: Secondary | ICD-10-CM

## 2018-11-05 MED ORDER — CETIRIZINE HCL 10 MG PO TABS
10.0000 mg | ORAL_TABLET | Freq: Every day | ORAL | 0 refills | Status: DC
Start: 2018-11-05 — End: 2019-04-21

## 2018-11-05 MED ORDER — QNASL 80 MCG/ACT NA AERS
2.00 | INHALATION_SPRAY | Freq: Every day | NASAL | 1 refills | Status: DC
Start: 2018-11-05 — End: 2019-04-22

## 2018-11-05 MED ORDER — ALBUTEROL SULFATE HFA 108 (90 BASE) MCG/ACT IN AERS
2.0000 | INHALATION_SPRAY | RESPIRATORY_TRACT | 0 refills | Status: DC | PRN
Start: 2018-11-05 — End: 2019-04-22

## 2018-11-05 NOTE — Progress Notes (Signed)
Have you seen any specialists/other providers since your last visit with [place]?    No    Arm preference verified?   No    The patient is due for influenza vaccine, shingles vaccine and care plan letter, AD

## 2018-11-05 NOTE — Progress Notes (Signed)
TELEMEDICINE VISIT              CLINICAL SUMMARY        Verbal Consent      Verbal consent has been obtained from the patient to conduct a video visit encounter to minimize exposure to COVID-19: yes      Chief Complaint:      Chief Complaint   Patient presents with   . Shortness of Breath   . Cough         Problem List, Medications, and Allergies reviewed: yes      HPI:  HPI  C/o SOB, cough, chest tightness, sore throat x 1 week.  No known fever but has intermittent hot flashes but no known fever. No relief with albuterol. Has not been taking Claritin since start of Fall, only 2 days ago. No wheezing, body aches, diarrhea.    Has recurrent bronchitis (last flare 03/2018), usually around Spring/Fall time, does not have hx of asthma and has an albuterol inhaler but has not used. Has seasonal allergies, usually controlled with Qnsal but not covered by insurance. Zyrtec 10mg  daily. Has never been on allergy shots in the past. Son not sick. No direct contact with COVID-confirmed case, no recent travel but has been going to grocery.    Physical Exam   Constitutional: No distress.   Pulmonary/Chest: No respiratory distress.   Neurological: She is alert.   Psychiatric: Mood and affect normal.       Assessment/Plan:    1. Acute bronchitis, unspecified organism  - Likely has asthma, allergy-related. Discussed about the need to treat allergies (see below). Use albuterol sulfate HFA (PROVENTIL) 108 (90 Base) MCG/ACT inhaler; Inhale 2 puffs into the lungs every 4 (four) hours scheduled x 2 days then as needed for Wheezing or Shortness of Breath (cough)  Dispense: 1 Inhaler; Refill: 0  - COVID-19 SYMPTOMATIC Patient; Future  - Pt to call next week if not better and consider starting Singulair 10mg  qHs.    2. Suspected COVID-19 virus infection  - COVID-19 SYMPTOMATIC Patient; Future  - Advised to home isolate until results available. Warning signs discussed and notify MD if present.    3. Seasonal allergic rhinitis due  to pollen  - Uncontrolled. Restart cetirizine (ZyrTEC Allergy) 10 MG tablet; Take 1 tablet (10 mg total) by mouth daily  Dispense: 90 tablet; Refill: 0 and Qnsal (refills provided).      RTC if symptoms persist and prn.    Time spent in medical discussion: 21 to 30 minutes    Ofilia Rayon F Dumlao-Umayam, MD

## 2018-11-05 NOTE — Telephone Encounter (Signed)
Pt seen via telemed visit today

## 2018-11-06 ENCOUNTER — Encounter (INDEPENDENT_AMBULATORY_CARE_PROVIDER_SITE_OTHER): Payer: No Typology Code available for payment source

## 2018-11-09 ENCOUNTER — Ambulatory Visit (INDEPENDENT_AMBULATORY_CARE_PROVIDER_SITE_OTHER): Payer: No Typology Code available for payment source

## 2018-11-09 DIAGNOSIS — Z20822 Contact with and (suspected) exposure to covid-19: Secondary | ICD-10-CM

## 2018-11-09 DIAGNOSIS — Z20828 Contact with and (suspected) exposure to other viral communicable diseases: Secondary | ICD-10-CM

## 2018-11-14 ENCOUNTER — Encounter (INDEPENDENT_AMBULATORY_CARE_PROVIDER_SITE_OTHER): Payer: Self-pay

## 2018-11-14 LAB — CORONAVIRUS 2 (SARS-COV-2) RNA DETECTION: SARS CoV 2 Overall Result: NOT DETECTED

## 2018-11-18 ENCOUNTER — Encounter (INDEPENDENT_AMBULATORY_CARE_PROVIDER_SITE_OTHER): Payer: Self-pay | Admitting: Family Medicine

## 2018-11-18 ENCOUNTER — Telehealth (INDEPENDENT_AMBULATORY_CARE_PROVIDER_SITE_OTHER): Payer: Self-pay | Admitting: Family Medicine

## 2018-11-18 DIAGNOSIS — J209 Acute bronchitis, unspecified: Secondary | ICD-10-CM

## 2018-11-18 DIAGNOSIS — J301 Allergic rhinitis due to pollen: Secondary | ICD-10-CM

## 2018-11-18 MED ORDER — PREDNISONE 20 MG PO TABS
40.00 mg | ORAL_TABLET | Freq: Every day | ORAL | 0 refills | Status: AC
Start: 2018-11-18 — End: 2018-11-23

## 2018-11-18 MED ORDER — MONTELUKAST SODIUM 10 MG PO TABS
10.0000 mg | ORAL_TABLET | Freq: Every evening | ORAL | 0 refills | Status: DC
Start: 2018-11-18 — End: 2018-12-23

## 2018-11-18 NOTE — Telephone Encounter (Signed)
Pt is requesting an in office appointment as she was recently tested negative for covid and is still experiencing upper left chest pain radiating to back as well as shortness of breathe. Pt can be reached at [phone].

## 2018-11-18 NOTE — Telephone Encounter (Signed)
Called pt, she states that she is having pain on the left side of her chest above her breast x 3 weeks, she has SOB that comes and goes but she is using her inhaler with no relief. She has had chills but is going through menopause, and is always hot and cold. Pt not sure what to do please advise.

## 2018-11-18 NOTE — Telephone Encounter (Signed)
Please advise 

## 2018-11-18 NOTE — Telephone Encounter (Signed)
Albuterol helps some but having sharp pain L-side of chest when deep breathing. No fever. Non-productive. Has been taking Zyrtec with improvement of PND and nasal congestion. Start prednisone 40mg  daily x 5 days and star Montelukast. CP reproducible with palpation, likely muscle strain. Schedule video ffup in 2 weeks. Risk & Benefits of the new medication(s) were explained to the pt (and family) who appeared to understand & agree to the treatment plan.

## 2018-11-25 ENCOUNTER — Telehealth (INDEPENDENT_AMBULATORY_CARE_PROVIDER_SITE_OTHER): Payer: Self-pay | Admitting: Family Medicine

## 2018-11-25 ENCOUNTER — Ambulatory Visit (INDEPENDENT_AMBULATORY_CARE_PROVIDER_SITE_OTHER): Payer: No Typology Code available for payment source | Admitting: Family Medicine

## 2018-11-25 ENCOUNTER — Encounter (INDEPENDENT_AMBULATORY_CARE_PROVIDER_SITE_OTHER): Payer: Self-pay | Admitting: Family Medicine

## 2018-11-25 DIAGNOSIS — J301 Allergic rhinitis due to pollen: Secondary | ICD-10-CM

## 2018-11-25 DIAGNOSIS — J209 Acute bronchitis, unspecified: Secondary | ICD-10-CM

## 2018-11-25 DIAGNOSIS — R079 Chest pain, unspecified: Secondary | ICD-10-CM

## 2018-11-25 MED ORDER — BUDESONIDE-FORMOTEROL FUMARATE 160-4.5 MCG/ACT IN AERO
2.00 | INHALATION_SPRAY | Freq: Two times a day (BID) | RESPIRATORY_TRACT | 5 refills | Status: AC
Start: 2018-11-25 — End: 2019-11-25

## 2018-11-25 NOTE — Telephone Encounter (Signed)
Patient called to speak with a nurse, when the phone call was transferred to me, the call got disconnected. I called patient back, however phone was not answer. Will call back later.

## 2018-11-25 NOTE — Progress Notes (Signed)
TELEMEDICINE VISIT              CLINICAL SUMMARY        Verbal Consent      Verbal consent has been obtained from the patient to conduct a video visit encounter to minimize exposure to COVID-19: yes      Chief Complaint:      Chief Complaint   Patient presents with   . Back Pain     center of the back   . Shortness of Breath     x3 weeks         Problem List, Medications, and Allergies reviewed: yes      HPI:  HPI  Here for persistent SOB and chest pain in L side of breast x 3 weeks. Worse CP when taking deep breaths. Some improvement of nasal congestion, PND with addition of SIngulair to Qnsal and Zyrtec 10mg  daily. Clear rhinorrhea. Does not seem to be improved with Prednisone 40mg  daily x 5 days. Negative COVID screen 11/14/2018. Pt unable to work well from home since interrupted by cough, fatigue and asking for note for work. HAs been using albuterol HFA 4x daily with temporary relief of cough but no purulent sputum.    Review of Systems   Constitutional: Positive for malaise/fatigue. Negative for chills and fever.   Respiratory: Positive for cough, shortness of breath and wheezing.    Cardiovascular: Negative for chest pain (when coughing) and palpitations.   Neurological: Negative for dizziness and headaches.     Physical Exam   Constitutional: No distress.   Pulmonary/Chest: No respiratory distress.   Neurological: She is alert.   Psychiatric: Mood and affect normal.         Assessment/Plan:    1. Acute bronchitis, unspecified organism  - X-ray chest PA and lateral; Future  - Start budesonide-formoterol (SYMBICORT) 160-4.5 MCG/ACT inhaler; Inhale 2 puffs into the lungs 2 (two) times daily  Dispense: 1 Inhaler; Refill: 5  - Continue albuterol HFA 1 puffs q4hrs prn.    2. Chest pain, unspecified type  - Pleuritic chest pain, due to chestwall strain due to constant coughing.  - X-ray chest PA and lateral; Future to evaluate for pneumonia    3. Allergic rhinitis due to pollen, unspecified seasonality  -  Improving with addition of SIngulair. Continue current meds. COnsider adding AStelin nasal if uncontrolled and/or allergist referral    RTC 1 week for SOB, chest pain (video)    Time spent in medical discussion: 21 to 30 minutes    Tracy Lightle F Dumlao-Umayam, MD

## 2018-11-25 NOTE — Progress Notes (Signed)
Have you seen any specialists/other providers since your last visit with [place]?    No    Arm preference verified?   No    The patient is due for influenza vaccine, shingles vaccine, pneumonia vaccine and AD

## 2018-11-30 ENCOUNTER — Encounter (INDEPENDENT_AMBULATORY_CARE_PROVIDER_SITE_OTHER): Payer: Self-pay

## 2018-12-04 ENCOUNTER — Other Ambulatory Visit (INDEPENDENT_AMBULATORY_CARE_PROVIDER_SITE_OTHER): Payer: Self-pay | Admitting: "Endocrinology

## 2018-12-04 DIAGNOSIS — E890 Postprocedural hypothyroidism: Secondary | ICD-10-CM

## 2018-12-14 ENCOUNTER — Encounter (INDEPENDENT_AMBULATORY_CARE_PROVIDER_SITE_OTHER): Payer: Self-pay

## 2018-12-23 ENCOUNTER — Other Ambulatory Visit (INDEPENDENT_AMBULATORY_CARE_PROVIDER_SITE_OTHER): Payer: Self-pay | Admitting: Family Medicine

## 2018-12-23 DIAGNOSIS — J209 Acute bronchitis, unspecified: Secondary | ICD-10-CM

## 2018-12-23 DIAGNOSIS — J301 Allergic rhinitis due to pollen: Secondary | ICD-10-CM

## 2019-01-05 ENCOUNTER — Telehealth (INDEPENDENT_AMBULATORY_CARE_PROVIDER_SITE_OTHER): Payer: Self-pay | Admitting: Family Medicine

## 2019-01-05 DIAGNOSIS — Z9884 Bariatric surgery status: Secondary | ICD-10-CM

## 2019-01-05 DIAGNOSIS — D126 Benign neoplasm of colon, unspecified: Secondary | ICD-10-CM

## 2019-01-05 DIAGNOSIS — Z1211 Encounter for screening for malignant neoplasm of colon: Secondary | ICD-10-CM

## 2019-01-05 NOTE — Telephone Encounter (Signed)
patient is calling and needs an updated referral for a Gastroenterlolgist to be faxed to [phone].    patient states this needs to be done today     please call patient back to let her know that this has been done.     Best call back number is [phone]

## 2019-01-10 NOTE — Telephone Encounter (Signed)
GI referral placed. pls fax to number below and inform patient

## 2019-01-11 ENCOUNTER — Other Ambulatory Visit (INDEPENDENT_AMBULATORY_CARE_PROVIDER_SITE_OTHER): Payer: Self-pay | Admitting: Family Medicine

## 2019-01-11 ENCOUNTER — Ambulatory Visit
Admission: RE | Admit: 2019-01-11 | Discharge: 2019-01-11 | Disposition: A | Discharge status: Home / Self Care | Payer: No Typology Code available for payment source | Source: Ambulatory Visit | Attending: "Endocrinology | Admitting: "Endocrinology

## 2019-01-11 ENCOUNTER — Ambulatory Visit
Admission: RE | Admit: 2019-01-11 | Discharge: 2019-01-11 | Disposition: A | Discharge status: Home / Self Care | Payer: No Typology Code available for payment source | Source: Ambulatory Visit | Attending: Family Medicine | Admitting: Family Medicine

## 2019-01-11 DIAGNOSIS — R079 Chest pain, unspecified: Secondary | ICD-10-CM

## 2019-01-11 DIAGNOSIS — E890 Postprocedural hypothyroidism: Secondary | ICD-10-CM

## 2019-01-11 DIAGNOSIS — J209 Acute bronchitis, unspecified: Secondary | ICD-10-CM

## 2019-01-11 LAB — T4, FREE: T4 Free: 0.78 ng/dL (ref 0.70–1.48)

## 2019-01-11 LAB — T3, FREE: T3, Free: 2.3 pg/mL (ref 1.71–3.71)

## 2019-01-11 LAB — TSH: TSH: 2.73 u[IU]/mL (ref 0.35–4.94)

## 2019-01-11 NOTE — Telephone Encounter (Signed)
Gastro order faxed to [phone]  Received confirmation

## 2019-01-12 ENCOUNTER — Encounter (INDEPENDENT_AMBULATORY_CARE_PROVIDER_SITE_OTHER): Payer: Self-pay

## 2019-01-12 ENCOUNTER — Telehealth (INDEPENDENT_AMBULATORY_CARE_PROVIDER_SITE_OTHER): Payer: Self-pay

## 2019-01-12 ENCOUNTER — Telehealth (INDEPENDENT_AMBULATORY_CARE_PROVIDER_SITE_OTHER): Payer: No Typology Code available for payment source | Admitting: Internal Medicine

## 2019-01-12 ENCOUNTER — Telehealth (INDEPENDENT_AMBULATORY_CARE_PROVIDER_SITE_OTHER): Payer: Self-pay | Admitting: Family Medicine

## 2019-01-12 ENCOUNTER — Encounter (INDEPENDENT_AMBULATORY_CARE_PROVIDER_SITE_OTHER): Payer: Self-pay | Admitting: Internal Medicine

## 2019-01-12 VITALS — Ht 62.99 in | Wt 136.0 lb

## 2019-01-12 DIAGNOSIS — Z01812 Encounter for preprocedural laboratory examination: Secondary | ICD-10-CM

## 2019-01-12 DIAGNOSIS — Z903 Acquired absence of stomach [part of]: Secondary | ICD-10-CM

## 2019-01-12 DIAGNOSIS — R197 Diarrhea, unspecified: Secondary | ICD-10-CM

## 2019-01-12 DIAGNOSIS — Z9049 Acquired absence of other specified parts of digestive tract: Secondary | ICD-10-CM

## 2019-01-12 DIAGNOSIS — Z9884 Bariatric surgery status: Secondary | ICD-10-CM

## 2019-01-12 MED ORDER — SUPREP BOWEL PREP KIT 17.5-3.13-1.6 GM/177ML PO SOLN
ORAL | 0 refills | Status: DC
Start: 2019-01-12 — End: 2019-03-09

## 2019-01-12 MED ORDER — CHOLESTYRAMINE 4 GM/DOSE PO POWD
4.00 g | Freq: Every day | ORAL | 3 refills | Status: AC
Start: 2019-01-12 — End: 2019-05-12

## 2019-01-12 NOTE — Patient Instructions (Addendum)
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  - Colonoscopy with mucosal biopsy for evaluation of inflammatory/secretory diarrhea and examination of the terminal ileum for CT findings concerning for ileitis.  - Trial therapy: Cholestyramine for bile acid diarrhea. Counseled the patient to to avoid taking other medications 1 hour prior and 4 hours after taking cholestyramine to avoid interference of absorption of other medications.    Diagnostic tests and referrals placed today:    - Colonoscopy (See instructions below)    Orders Placed This Encounter   Procedures   . Stool Cryptosporidium / Giardia Antigen     Standing Status:   Future     Standing Expiration Date:   01/12/2020   . COVID-19 (SARS-CoV-2)     Within 72 hours before endoscopic procedures.     Standing Status:   Future     Standing Expiration Date:   02/12/2019     Order Specific Question:   Source     Answer:   Nasopharyngeal     Order Specific Question:   Purpose of Test:     Answer:   Screening     Order Specific Question:   Symptomatic for COVID-19 as defined by CDC?     Answer:   No     Order Specific Question:   Hospitalized for COVID-19?     Answer:   No     Order Specific Question:   Admitted to ICU for COVID-19?     Answer:   No     Order Specific Question:   Employed in healthcare setting with direct patient contact?     Answer:   Unknown     Order Specific Question:   Resident in a congregate (group) care setting?     Answer:   Unknown     Order Specific Question:   Pregnant?     Answer:   Unknown   . CBC without differential     Standing Status:   Future     Standing Expiration Date:   01/12/2020   . C Reactive Protein     Standing Status:   Future     Standing Expiration Date:   01/12/2020   . Celiac Disease Comprehensive Panel     Standing Status:   Future      Standing Expiration Date:   01/12/2020   . Stool Calprotectin Immunoassay     Standing Status:   Future     Standing Expiration Date:   07/12/2019     Order Specific Question:   Specimen type/source/volume     Answer:   Stool/10 mL   . Stool Pancreatic Elastase     Standing Status:   Future     Standing Expiration Date:   07/12/2019     Order Specific Question:   Specimen type/source/volume     Answer:   stool, 10 mL   . Fat, F     Standing Status:   Future     Standing Expiration Date:   01/12/2020     Order Specific Question:   Specimen type/source/volume     Answer:   Stool / 10 ml       Start taking the following new medications prescribed today:  Orders Placed This Encounter   Medications   . cholestyramine (QUESTRAN) 4  GM/DOSE powder     Sig: Take 1 packet (4 g total) by mouth daily Dissolve in 8 oz of liquid and drink before a meal     Dispense:  120 g     Refill:  3   . Na sulfate-K sulfate-Mg sulfate (Suprep Bowel Prep Kit) 17.5-3.13-1.6 GM/177ML Solution     Sig: Take one kit (two bottles in one package) following the instructions sent from your GI doctor's office.     Dispense:  354 mL     Refill:  0        Stop taking the following medications:  There are no discontinued medications.    ---------------  Colonoscopy Bowel Prep Instructions  (SUPREP BOWEL PREP KIT)    Einar Grad, MD  [address]  [place] [address]    Patient Name: Tracy [name]    1. Starting from day before the procedure only have clear liquids for breakfast, lunch, and dinner. (Broth, Jello, clear sodas, clear apple juice, white grape juice, white cranberry juice, weak tea NO milk or lemon). You may continue to have clear liquids until 4 hours prior to your procedure. NO RED JELLO.     2. SUPREP Bowel Prep Kit is a split-dose (2-day) regimen. Both 6-ounce bottles are required for a complete prep.    3. First Dose: Begin Step 1 at 6 pm the evening before your procedure:   Step 1 Pour one (1) 6-ounce bottle of SUPREP  liquid into the mixing container.  Step 2 Add cool water to the 16-ounce line on the container and mix.  Step 3 Drink all the liquid in the container.  Step 4 You must drink two (2) more 16-ounce containers of water over the next hour.    4. Second Dose: On the day of your colonoscopy, 5 HOURS before your colonoscopy, repeat Steps 1-4 using the other 6-ounce bottle. If you drink according to schedule, you will finish drinking 4 hours before your colonoscopy.     Note: You must stop everything by mouth, including all liquids, smoking and chewing gum 3 hour before your colonoscopy.     5. If you are taking any type of iron tablets stop taking them until after your procedure.     Date: In 2 weeks.      TAKE THE FOLLOWING MEDICATIONS WITH A SIP OF WATER ON THE MORNING OF THE TEST:      . You may take all of your morning medicines (except for oral diabetes medicine - pills) as usual with a sip of water up to 4 hours before your procedure.  . If you take oral diabetes medicine (pills) for your diabetes: do not take the medicine the morning of your procedure.         *Important note: This examination carries with it the risk of perforation, a serious complication that could require surgery.       PLEASE NOTE:  YOU MUST HAVE SOMEONE DRIVE YOU HOME FROM YOUR PROCEDURE, OTHER THAN A TAXI OR YOUR PROCEDURE WILL BE CANCELLED!        []   [facility]    Please report 1 HOUR prior to your scheduled procedure.    [address]  [place], [place] [address]  [phone]      -------------------        Colonoscopy     A camera attached to a flexible tube with a viewing lens is used to take  video pictures.   Colonoscopy is a test to view the inside of your lower digestive tract (colon and rectum). Sometimes it can show the last part of the small intestine (ileum). During the test, small pieces of tissue may be removed for testing. This is called a biopsy. Small growths, such as polyps, may also be  removed.   Why is colonoscopy done?  The test is done to help look for colon cancer. And it can help find the source of abdominal pain, bleeding, and changes in bowel habits. It may be needed once a year to every 10 years, depending on factors such as your:   Age   Health history   Family health history   Symptoms   Results from any prior colonoscopy  Risks and possible complications  These include:   Bleeding                A puncture or tear in the colon    Risks of anesthesia   A cancer lesion not being seen or fully removed  Getting ready   To prepare for the test:   Talk with your healthcare provider about the risks of the test (see below). Also ask your healthcare provider about alternatives to the test.   Tell your healthcare provider about any medicines and supplements you take. Also tell him or her about any health conditions you may have.   Make sure your rectum and colon are empty for the test. Follow the diet and bowel prep instructions exactly. If you don't, the test may need to be rescheduled.   Plan for a friend or family member to drive you home after the test.     Colonoscopy provides an inside view of the entire colon.     You may discuss the results with your doctor right away or at a future visit.  During the test   The test is usually done in the hospital on an outpatient basis or at an outpatient clinic. This means you go home the same day. The procedure takes about 30 minutes. During that time:   You are given relaxing (sedating) medicine through an IV line. You may be drowsy, or fully asleep.   The healthcare provider will first give you a physical exam to check for anal and rectal problems.   Then the anus is lubricated and the scope inserted.   If you are awake, you may have a feeling similar to needing to have a bowel movement. You may also feel pressure as air is pumped into the colon. It's OK to pass gas during the procedure.   Biopsy, polyp removal, or other treatments  may be done during the test.  After the test   You may have gas right after the test. It can help to try to pass it to help prevent later bloating. Your healthcare provider may discuss the results with you right away. Or you may need to schedule a follow-up visit to talk about the results. After the test, you can go back to your normal eating and other activities. You may be tired from the sedation and need to rest for a few hours. Discuss your medicines with your provider to understand if they can be restarted right away.  [employer] last reviewed this educational content on 06/06/2017   2000-2020 [employer], [place]. [address], [place], [place] [address]. All rights reserved. This information is not intended as a substitute for professional medical care. Always follow your  healthcare professional's instructions.

## 2019-01-12 NOTE — Telephone Encounter (Signed)
Spoke with pt. Pt is scheduled 1/26 at 2:30 pm at 1:30 pm at [facility]. Went over prep instructions with pt, pt verbalized understanding. Pt will have COVID test 4 days prior. Will send pt prep instructions and COVID info via MyChart per pt preference.

## 2019-01-12 NOTE — Progress Notes (Addendum)
[address], [address]  [place], [place].  [address]  [phone]  Fax:[phone]      NEW PATIENT VISIT    Date of Visit: 01/12/2019 12:08 PM  Patient ID: Tracy [name] is a 54 y.o. female.  Referring Physician: @REFPROVFL @     Telehealth: Verbal consent has been obtained from the patient to conduct a video and telephone visit to minimize exposure to COVID-19: yes     Reason for Consultation:   Diarrhea    History:   Tracy [name] is a 54 y.o. female who presented for follow-up of chronic diarrhea.    She has had chronic diarrhea for more than 2 years.  She has on average 4-8 bowel months per day with loose, greasy stools, worse after eating, associate with lower abdominal cramps.  She has been following up with a outside gastroenterologist in 2019.     Previous lab tests including CBC, blood chemistries was unremarkable.  CT abdomen and pelvis in August 2019 showed findings concerning for ileitis. EGD and colonoscopy in January 2020 was unremarkable.  However the terminal ileum was not assessed.  She was advised to avoid fatty food and the use Imodium as needed for diarrhea, however the diarrhea has been persistent.  She denied seeing blood in the stool, constipation, nausea, vomiting, unintentional weight loss.    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  She had a history of cholecystectomy.    Problem List:     Patient Active Problem List   Diagnosis   . Migraine   . History of hypertension   . Abdominal cyst   . Hiatal hernia   . Hypokalemia   . S/P gastric bypass   . Fatty liver   . Postsurgical hypothyroidism   . Tubular adenoma of colon   . Allergic rhinitis   . Medication management   . Sleep difficulties   . Difficulty concentrating   . Forgetfulness   . Hx of migraine headaches   . Occipital headache   . Frontal headache   . Hx of concussion   . White matter abnormality on MRI of brain   . Hx of sleep apnea       Past Medical History:      Past Medical History:   Diagnosis Date   . Anemia    . Arthritis    . Carotid stenosis    . Fatty liver 2017   . GERD (gastroesophageal reflux disease)    . Headache    . Hepatic cirrhosis    . Hiatal hernia    . Sleep apnea     not using CPAP machine   . Vitamin D deficiency        Past Surgical History:     Past Surgical History:   Procedure Laterality Date   . CHOLECYSTECTOMY  12/2016   . COLONOSCOPY, BIOPSY N/A 01/27/2018    Procedure: COLONOSCOPY, BIOPSY;  Surgeon: Shanon Brow, MD;  Location: [facility];  Service: Gastroenterology;  Laterality: N/A;   . EGD, BIOPSY N/A 01/27/2018    Procedure: EGD, BIOPSY;  Surgeon: Shanon Brow, MD;  Location: [facility];  Service: Gastroenterology;  Laterality: N/A;   . LAPAROSCOPIC REVISION SLEEVE GASTRECTOMY TO GASTRIC BYPASS     . LAPAROSCOPIC, GASTRECTOMY SLEEVE     . SALPINGO OOPHERECTOMY Bilateral    . THYROIDECTOMY  2013       Current Medications:   She  has a current medication list which includes the following prescription(s): albuterol sulfate hfa, armour thyroid, qnasl, biotin, budesonide-formoterol, butalbital-acetaminophen-caffeine, calcium-vitamin d, cetirizine, cyanocobalamin, montelukast, multivitamin with minerals, aerochamber z-stat plus/medium, vitamin d, cholestyramine, and suprep bowel prep kit.     Outpatient Medications Marked as Taking for the 01/12/19 encounter (Telemedicine Visit) with Einar Grad, MD   Medication Sig Dispense Refill   . albuterol sulfate HFA (PROVENTIL) 108 (90 Base) MCG/ACT inhaler Inhale 2 puffs into the lungs every 4 (four) hours as needed for Wheezing or Shortness of Breath (cough) 1 Inhaler 0   . Armour Thyroid 60 MG tablet TAKE 1 TABLET(60 MG) BY MOUTH DAILY 30 tablet 2   . Beclomethasone Dipropionate (Qnasl) 80 MCG/ACT Aero Soln 2 sprays by Nasal route daily 3 Inhaler 1   . Biotin 5 MG/ML Liquid Take by mouth daily        . budesonide-formoterol (SYMBICORT) 160-4.5 MCG/ACT inhaler Inhale 2 puffs into  the lungs 2 (two) times daily 1 Inhaler 5   . butalbital-acetaminophen-caffeine (FIORICET, ESGIC) 50-325-40 MG per tablet Take 1 tablet by mouth every 4 (four) hours as needed for Headaches 10 tablet 0   . calcium-vitamin D (OSCAL-500 + D) 500-200 MG-UNIT per tablet Take 1 tablet by mouth daily     . cetirizine (ZyrTEC Allergy) 10 MG tablet Take 1 tablet (10 mg total) by mouth daily 90 tablet 0   . Cyanocobalamin 5000 MCG SL Tab by Nasal route once a week     . montelukast (SINGULAIR) 10 MG tablet TAKE 1 TABLET(10 MG) BY MOUTH EVERY NIGHT 90 tablet 0   . Multiple Vitamins-Minerals (MULTIVITAMIN WITH MINERALS) tablet Take 1 tablet by mouth daily     . Spacer/Aero-Holding Chambers (AEROCHAMBER Z-STAT PLUS/MEDIUM) inhaler Use as instructed 1 each 0   . vitamin D (CHOLECALCIFEROL) 25 MCG (1000 UT) tablet Take 1,000 Units by mouth daily         Allergies:     Allergies   Allergen Reactions   . Bee Pollen Swelling     "Bee Stings".Marland KitchenMarland KitchenMarland KitchenStuffy nose; "throat swelling"     . Vicodin [Hydrocodone-Acetaminophen] Nausea And Vomiting       Family History:     Family History   Problem Relation Age of Onset   . Cancer Mother         hx ovarian   . Hypertension Mother    . Inflammatory bowel disease Mother    . Heart failure Mother    . Angina Mother    . Migraines Mother    . Asthma Father    . Diabetes Father    . Hyperlipidemia Father    . Hypertension Father    . Cancer Father         Currently has cancer   . Crohn's disease Father    . Asthma Sister    . Diabetes Sister    . Hyperlipidemia Sister    . Hypertension Sister    . Cancer Brother         hx of stomach   . Hyperlipidemia Brother    . Hypertension Brother    . Migraines Brother    . Breast cancer Paternal Aunt         died of    . Breast cancer Other         maternal cousin-- survior       Social History:     Social History     Tobacco Use   . Smoking status:  Never Smoker   . Smokeless tobacco: Never Used   Substance Use Topics   . Alcohol use: No   . Drug use: No        Review of Systems:   Constitutional: Denies fever, chills or weight loss.  Eyes: Denies Blurred vision, eye discharge or eye pain.   ENMT: Denies changes in hearing, nasal discharge, oral lesions or sore throat.  Respiratory: Denies wheezing or cough.  Cardiovascular: Denies chest pain or palpitations.  Gastrointestinal: See HPI.  Genitourinary: Denies gross hematuria or dark urine.  Musculoskeletal: Denies back pain or joint pain.  Neurologic: Denies slurred speech, hemiplegia or focal deficit.  Psychiatric: Denies depression or anxiety.   Hematologic: Denies easy bruising.  Integumentary:  Denies skin rashes or Jaundice.    Vital Signs:   Ht 1.6 m (5' 2.99")   Wt 61.7 kg (136 lb)   BMI 24.10 kg/m      Weight Monitoring 07/12/2018 08/04/2018 01/12/2019   Height 159 cm 158.8 cm 160 cm   Height Method - - -   Weight 62.869 kg 62.596 kg 61.689 kg   Weight Method - - -   BMI (calculated) 24.9 kg/m2 24.9 kg/m2 24.1 kg/m2          Physical Exam:   General appearance - Well developed and well nourished, no acute distress.  No obvious abnormalities on video exam.    Labs Reviewed:     Recent Labs     01/20/18  0823 08/15/17  0318 08/14/17  0839   WBC 3.17 3.83 3.56   Hgb 12.9 10.3* 11.1*   Hematocrit 41.0 31.8* 34.2*   Platelets 194 217 243       Recent Labs     08/15/17  1237 08/14/17  0839   PT 14.2 14.3   PT INR 1.1 1.1       Recent Labs     01/11/19  1637 05/04/18  1453 03/02/18  1702 01/20/18  0823  08/25/17  1423 08/16/17  0402 08/16/17  0401  08/14/17  0858   Sodium  --   --   --  142  --  142  --  141   < > 145   Potassium  --   --   --  4.1  --  4.4  --  3.5   < > 3.1*   Chloride  --   --   --  107  --  110  --  110   < > 110   CO2  --   --   --  27  --  25  --  24   < > 27   BUN  --   --   --  14.0  --  11.0  --  5.0*   < > 11.0   Creatinine  --   --   --  1.0  --  0.9  --  1.1*   < > 1.0   Calcium  --   --   --  9.5  --  8.7  --  8.8   < > 8.4*   Albumin  --   --   --  4.1  --  3.9  --  3.3*   < > 3.6   Protein,  Total  --   --   --  6.8  --  6.4  --  5.4*   < > 5.8*   Bilirubin, Total  --   --   --  1.5*  --  0.4  --  1.0   < > 1.0   Alkaline Phosphatase  --   --   --  115*  --  109*  --  148*   < > 102   ALT  --   --   --  31  --  46  --  206*   < > 37   AST (SGOT)  --   --   --  45*  --  54*  --  171*   < > 47*   Glucose  --   --   --  86  --  84  --  96   < > 89   Lipase  --   --   --   --   --   --   --   --   --  <4*   TSH 2.73 0.35 0.08* 7.53*   < > 17.07*  --   --    < >  --    Ferritin  --   --   --   --   --   --  29.26  --   --   --     < > = values in this interval not displayed.       Rads:   X-ray Chest Pa And Lateral    Result Date: 01/11/2019   No acute cardiopulmonary process.  Mitali Bapna, MD  01/11/2019 4:59 PM      GI Procedures:       Assessment and Plan:     1. Diarrhea, unspecified type  - CBC without differential; Future  - C Reactive Protein; Future  - Celiac Disease Comprehensive Panel; Future  - Stool Calprotectin Immunoassay; Future  - Stool Pancreatic Elastase; Future  - Stool Cryptosporidium / Giardia Antigen; Future  - cholestyramine (QUESTRAN) 4 GM/DOSE powder; Take 1 packet (4 g total) by mouth daily Dissolve in 8 oz of liquid and drink before a meal  Dispense: 120 g; Refill: 3  - Fat, F; Future    2. History of sleeve gastrectomy    3. History of cholecystectomy    4. History of gastric bypass    5. Encounter for preprocedural laboratory examination  - COVID-19 (SARS-CoV-2); Future     Orders Placed This Encounter   Medications   . cholestyramine (QUESTRAN) 4 GM/DOSE powder     Sig: Take 1 packet (4 g total) by mouth daily Dissolve in 8 oz of liquid and drink before a meal     Dispense:  120 g     Refill:  3   . Na sulfate-K sulfate-Mg sulfate (Suprep Bowel Prep Kit) 17.5-3.13-1.6 GM/177ML Solution     Sig: Take one kit (two bottles in one package) following the instructions sent from your GI doctor's office.     Dispense:  354 mL     Refill:  0      There are no discontinued medications.     Diagnostic data including lab tests, endoscopies, CT abdomen and pelvis, and previous physician's notes were reviewed.    Diagnostic data reviewed: Colonoscopy January 2020 was unremarkable except for 2 small polyps which were resected.  The terminal ileum was not examined.  Repeat colonoscopy in 5 years was recommended.  EGD in January 2020 showed post sleeve gastrectomy change, erythematous mucosa in the gastric antrum and was otherwise unremarkable.  CT abdomen and pelvis in August 2019 showed mild thickening of the distal small  bowel near the terminal ileum with stranding in the adjacent mesentery, consistent with ileitis.    # Chronic diarrhea.  Associate with mild abdominal cramps. Differential diagnosis for etiology include pancreatic insufficiency associated with sleeve gastrectomy and gastric bypass surgery, celiac disease, bile acid diarrhea secondary to cholecystectomy, microscopic colitis, Crohn's disease involving the small bowel, and irritable bowel syndrome with predominant diarrhea.    Diagnostic:   - C-reactive protein  - Celiac serology for diarrhea  - Fecal calprotectin for IBD for diarrhea  - Fecal Giardia Antigen for diarrhea  - Spot fecal fat and fecal elastase for exocrine pancreatic insufficiency.  - Colonoscopy with mucosal biopsy for evaluation of inflammatory/secretory diarrhea and examination of the terminal ileum for CT findings concerning for ileitis.  - Trial therapy: Cholestyramine for bile acid diarrhea. Counseled the patient to to avoid taking other medications 1 hour prior and 4 hours after taking cholestyramine to avoid interference of absorption of other medications.      Return if symptoms worsen or fail to improve.    It is a pleasure to participate in the care of Tracy [name]. If you have any questions or concerns, please feel free to contact us at [phone].    Einar Grad, MD

## 2019-01-12 NOTE — Telephone Encounter (Signed)
Patient is requesting to go over her imaging results. Please advise.    Patient can be reached at [phone]    Thank you

## 2019-01-13 ENCOUNTER — Telehealth (INDEPENDENT_AMBULATORY_CARE_PROVIDER_SITE_OTHER): Payer: Self-pay

## 2019-01-13 ENCOUNTER — Encounter (INDEPENDENT_AMBULATORY_CARE_PROVIDER_SITE_OTHER): Payer: Self-pay | Admitting: Internal Medicine

## 2019-01-13 NOTE — Telephone Encounter (Signed)
Spoke with pt regarding colo. Moved pt down to 2:45 instead of 2:30. Pt was agreeable.

## 2019-01-13 NOTE — Progress Notes (Signed)
Patient reviewed message in MyChart.

## 2019-01-14 ENCOUNTER — Encounter (INDEPENDENT_AMBULATORY_CARE_PROVIDER_SITE_OTHER): Payer: Self-pay

## 2019-01-14 NOTE — Telephone Encounter (Signed)
Spoke with patient and informed her of normal xray

## 2019-01-26 NOTE — Addendum Note (Signed)
Addended by: Dulcy Fanny on: 01/26/2019 11:24 AM     Modules accepted: Orders

## 2019-01-27 ENCOUNTER — Ambulatory Visit (INDEPENDENT_AMBULATORY_CARE_PROVIDER_SITE_OTHER): Payer: No Typology Code available for payment source

## 2019-01-27 DIAGNOSIS — Z01812 Encounter for preprocedural laboratory examination: Secondary | ICD-10-CM

## 2019-01-28 LAB — COVID-19 (SARS-COV-2): SARS CoV 2 Overall Result: NOT DETECTED

## 2019-02-01 ENCOUNTER — Ambulatory Visit (AMBULATORY_SURGERY_CENTER): Payer: No Typology Code available for payment source | Admitting: Internal Medicine

## 2019-02-01 ENCOUNTER — Encounter (FREE_STANDING_LABORATORY_FACILITY): Payer: No Typology Code available for payment source

## 2019-02-01 ENCOUNTER — Encounter (INDEPENDENT_AMBULATORY_CARE_PROVIDER_SITE_OTHER): Payer: Self-pay

## 2019-02-01 ENCOUNTER — Ambulatory Visit: Payer: Self-pay

## 2019-02-01 DIAGNOSIS — K640 First degree hemorrhoids: Secondary | ICD-10-CM

## 2019-02-01 DIAGNOSIS — K648 Other hemorrhoids: Secondary | ICD-10-CM

## 2019-02-01 DIAGNOSIS — R197 Diarrhea, unspecified: Secondary | ICD-10-CM

## 2019-02-01 DIAGNOSIS — K6389 Other specified diseases of intestine: Secondary | ICD-10-CM

## 2019-02-01 DIAGNOSIS — R933 Abnormal findings on diagnostic imaging of other parts of digestive tract: Secondary | ICD-10-CM

## 2019-02-02 ENCOUNTER — Encounter (INDEPENDENT_AMBULATORY_CARE_PROVIDER_SITE_OTHER): Payer: Self-pay | Admitting: Internal Medicine

## 2019-02-03 ENCOUNTER — Telehealth (INDEPENDENT_AMBULATORY_CARE_PROVIDER_SITE_OTHER): Payer: Self-pay | Admitting: Internal Medicine

## 2019-02-03 LAB — LAB USE ONLY - HISTORICAL SURGICAL PATHOLOGY

## 2019-02-03 NOTE — Telephone Encounter (Signed)
Tracy [name],    Please let pt know I have reviewed the pathology report of the biopsies taken during the colonscopy. There was no evidence of significant abnormality in the biopsies.     The following are the items we previously discussed but have not been done. Please advise her to follow the recommendations.    Diagnostic:   - C-reactive protein  - Celiac serology for diarrhea  - Fecal calprotectin for IBD for diarrhea  - Fecal Giardia Antigen for diarrhea  - Spot fecal fat and fecal elastase for exocrine pancreatic insufficiency.  - Trial therapy: Cholestyramine for bile acid diarrhea. Counseled the patient to to avoid taking other medications 1 hour prior and 4 hours after taking cholestyramine to avoid interference of absorption of other medications.       Einar Grad, MD  [facility]

## 2019-02-04 NOTE — Telephone Encounter (Signed)
Spoke with pt regarding results. Pt verbalized understanding. Gave pt scheduling number to schedule labs ordered. Informed pt of the trial therapy of Cholestyramine and how to take it. Pt request for a call back from Dr Roda Shutters. Please call at [phone].

## 2019-02-05 NOTE — Telephone Encounter (Signed)
Spoke with the patient regarding the recommendations.  All questions were answered.  Patient verbalized understanding and agreed with the plan.    DX

## 2019-02-14 ENCOUNTER — Encounter (INDEPENDENT_AMBULATORY_CARE_PROVIDER_SITE_OTHER): Payer: Self-pay

## 2019-02-15 ENCOUNTER — Encounter (INDEPENDENT_AMBULATORY_CARE_PROVIDER_SITE_OTHER): Payer: Self-pay

## 2019-03-04 ENCOUNTER — Other Ambulatory Visit (INDEPENDENT_AMBULATORY_CARE_PROVIDER_SITE_OTHER): Payer: Self-pay | Admitting: "Endocrinology

## 2019-03-04 DIAGNOSIS — E890 Postprocedural hypothyroidism: Secondary | ICD-10-CM

## 2019-03-04 NOTE — Telephone Encounter (Signed)
Please advise to schedule a one year follow up for April. Thank you

## 2019-03-09 ENCOUNTER — Encounter (INDEPENDENT_AMBULATORY_CARE_PROVIDER_SITE_OTHER): Payer: Self-pay | Admitting: Family Medicine

## 2019-03-09 ENCOUNTER — Ambulatory Visit (INDEPENDENT_AMBULATORY_CARE_PROVIDER_SITE_OTHER): Payer: No Typology Code available for payment source | Admitting: Family Medicine

## 2019-03-09 VITALS — BP 146/84 | HR 54 | Temp 98.3°F | Ht 62.5 in | Wt 144.0 lb

## 2019-03-09 DIAGNOSIS — N644 Mastodynia: Secondary | ICD-10-CM

## 2019-03-09 DIAGNOSIS — Z9884 Bariatric surgery status: Secondary | ICD-10-CM

## 2019-03-09 NOTE — Progress Notes (Signed)
Subjective:    Date: 03/09/2019 9:08 AM   Patient ID: Tracy [name] is a 54 y.o. female.    Chief Complaint   Patient presents with   . Breast concern     left breast lump     HPI  Pt new to me.  Pt previous PCP Dr. Toni Arthurs.    Pt c/o left sided breast lump.  States breast lump was noted on mammogram about 1 year ago.   She feels it has gotten larger.   She feels it is trigger back pain - Having pressure in back that comes and goes.   +Left lateral breast pain  No nipple discharge  Pt states she lost 100 lbs. Hx of gastric bypass 2018, states she needs to establish with a local bariatric physician.    Caffeine intake: 1 cup of coffee a day  Occasional soda    Review of records shows:    Diagnostic mammogram 01/20/2018 - no mammographic evidence of malignancy  Left breast ultrasound was performed due to c/o breast pain   IMPRESSION:    No ultrasound evidence of cystic or solid mass.    No problems updated.    Patient Active Problem List   Diagnosis   . Migraine   . History of hypertension   . Abdominal cyst   . Hiatal hernia   . Hypokalemia   . S/P gastric bypass   . Fatty liver   . Postsurgical hypothyroidism   . Tubular adenoma of colon   . Allergic rhinitis   . Medication management   . Sleep difficulties   . Difficulty concentrating   . Forgetfulness   . Hx of migraine headaches   . Occipital headache   . Frontal headache   . Hx of concussion   . White matter abnormality on MRI of brain   . Hx of sleep apnea     Patient Care Team:  Jules Husbands, DO as PCP - General (Family Medicine)  Einar Grad, MD as Consulting Physician (Gastroenterology)  Zerita Boers, MD as Consulting Physician (Endocrinology, Diabetes and Metabolism)    Immunization History   Administered Date(s) Administered   . Tdap 01/07/2011     Current Outpatient Medications   Medication Sig Dispense Refill   . Armour Thyroid 60 MG tablet TAKE 1 TABLET(60 MG) BY MOUTH DAILY 30 tablet 2   . Biotin 5 MG/ML Liquid Take by mouth daily        .  calcium-vitamin D (OSCAL-500 + D) 500-200 MG-UNIT per tablet Take 1 tablet by mouth daily     . Cyanocobalamin 5000 MCG SL Tab by Nasal route once a week     . Multiple Vitamins-Minerals (MULTIVITAMIN WITH MINERALS) tablet Take 1 tablet by mouth daily     . vitamin D (CHOLECALCIFEROL) 25 MCG (1000 UT) tablet Take 1,000 Units by mouth daily     . albuterol sulfate HFA (PROVENTIL) 108 (90 Base) MCG/ACT inhaler Inhale 2 puffs into the lungs every 4 (four) hours as needed for Wheezing or Shortness of Breath (cough) 1 Inhaler 0   . Beclomethasone Dipropionate (Qnasl) 80 MCG/ACT Aero Soln 2 sprays by Nasal route daily 3 Inhaler 1   . budesonide-formoterol (SYMBICORT) 160-4.5 MCG/ACT inhaler Inhale 2 puffs into the lungs 2 (two) times daily 1 Inhaler 5   . butalbital-acetaminophen-caffeine (FIORICET, ESGIC) 50-325-40 MG per tablet Take 1 tablet by mouth every 4 (four) hours as needed for Headaches 10 tablet 0   . cetirizine (ZyrTEC Allergy)  10 MG tablet Take 1 tablet (10 mg total) by mouth daily 90 tablet 0   . cholestyramine (QUESTRAN) 4 GM/DOSE powder Take 1 packet (4 g total) by mouth daily Dissolve in 8 oz of liquid and drink before a meal 120 g 3   . montelukast (SINGULAIR) 10 MG tablet TAKE 1 TABLET(10 MG) BY MOUTH EVERY NIGHT 90 tablet 0   . Spacer/Aero-Holding Chambers (AEROCHAMBER Z-STAT PLUS/MEDIUM) inhaler Use as instructed 1 each 0     No current facility-administered medications for this visit.      Allergies   Allergen Reactions   . Bee Pollen Swelling     "Bee Stings".Marland KitchenMarland KitchenMarland KitchenStuffy nose; "throat swelling"     . Vicodin [Hydrocodone-Acetaminophen] Nausea And Vomiting       Past Medical History:   Diagnosis Date   . Anemia    . Arthritis    . Carotid stenosis    . Fatty liver 2017   . GERD (gastroesophageal reflux disease)    . Headache    . Hepatic cirrhosis    . Hiatal hernia    . Sleep apnea     not using CPAP machine   . Vitamin D deficiency      Past Surgical History:   Procedure Laterality Date   .  CHOLECYSTECTOMY  12/2016   . COLONOSCOPY, BIOPSY N/A 01/27/2018    Procedure: COLONOSCOPY, BIOPSY;  Surgeon: Shanon Brow, MD;  Location: [facility];  Service: Gastroenterology;  Laterality: N/A;   . EGD, BIOPSY N/A 01/27/2018    Procedure: EGD, BIOPSY;  Surgeon: Shanon Brow, MD;  Location: [facility];  Service: Gastroenterology;  Laterality: N/A;   . LAPAROSCOPIC REVISION SLEEVE GASTRECTOMY TO GASTRIC BYPASS     . LAPAROSCOPIC, GASTRECTOMY SLEEVE     . SALPINGO OOPHERECTOMY Bilateral    . THYROIDECTOMY  2013       Family History   Problem Relation Age of Onset   . Cancer Mother         hx ovarian   . Hypertension Mother    . Inflammatory bowel disease Mother    . Heart failure Mother    . Angina Mother    . Migraines Mother    . Asthma Father    . Diabetes Father    . Hyperlipidemia Father    . Hypertension Father    . Cancer Father         Currently has cancer   . Crohn's disease Father    . Asthma Sister    . Diabetes Sister    . Hyperlipidemia Sister    . Hypertension Sister    . Cancer Brother         hx of stomach   . Hyperlipidemia Brother    . Hypertension Brother    . Migraines Brother    . Breast cancer Paternal Aunt         died of    . Breast cancer Other         maternal cousin-- survior       Social History     Socioeconomic History   . Marital status: Significant Other     Spouse name: Not on file   . Number of children: Not on file   . Years of education: Not on file   . Highest education level: Not on file   Occupational History   . Not on file   Social Needs   . Financial resource strain: Not on  file   . Food insecurity     Worry: Not on file     Inability: Not on file   . Transportation needs     Medical: Not on file     Non-medical: Not on file   Tobacco Use   . Smoking status: Never Smoker   . Smokeless tobacco: Never Used   Substance and Sexual Activity   . Alcohol use: No   . Drug use: No   . Sexual activity: Not on file   Lifestyle   . Physical activity     Days per  week: Not on file     Minutes per session: Not on file   . Stress: Not on file   Relationships   . Social Wellsite geologist on phone: Not on file     Gets together: Not on file     Attends religious service: Not on file     Active member of club or organization: Not on file     Attends meetings of clubs or organizations: Not on file     Relationship status: Not on file   . Intimate partner violence     Fear of current or ex partner: Not on file     Emotionally abused: Not on file     Physically abused: Not on file     Forced sexual activity: Not on file   Other Topics Concern   . Not on file   Social History Narrative   . Not on file     The following portions of the patient's history were reviewed and updated as appropriate: allergies, current medications, past family history, past medical history, past social history, past surgical history and problem list.    Review of Systems   Constitutional: Negative for unexpected weight change.   Respiratory: Negative for shortness of breath.    Cardiovascular: Negative for chest pain.   Skin: Negative.         Objective:   BP 146/84 (BP Site: Left arm)   Pulse (!) 54   Temp 98.3 F (36.8 C) (Temporal)   Ht 1.588 m (5' 2.5")   Wt 65.3 kg (144 lb)   BMI 25.92 kg/m   Wt Readings from Last 3 Encounters:   03/09/19 65.3 kg (144 lb)   01/12/19 61.7 kg (136 lb)   08/04/18 62.6 kg (138 lb)     Physical Exam  Vitals signs and nursing note reviewed.   Constitutional:       General: She is not in acute distress.     Appearance: She is not ill-appearing.   Neck:      Musculoskeletal: Neck supple.      Thyroid: No thyroid mass or thyromegaly.      Vascular: No carotid bruit.   Cardiovascular:      Rate and Rhythm: Normal rate and regular rhythm.      Heart sounds: S1 normal and S2 normal. No murmur.      Comments:     Pulmonary:      Effort: Pulmonary effort is normal.      Breath sounds: Normal breath sounds. No decreased breath sounds, wheezing, rhonchi or rales.   Chest:       Chest wall: No mass, deformity, swelling or crepitus.      Breasts: Breasts are symmetrical.         Right: No swelling, bleeding, inverted nipple, mass, nipple discharge, skin change or tenderness.  Left: Tenderness present. No swelling, bleeding, inverted nipple, mass, nipple discharge or skin change.   Musculoskeletal:      Right lower leg: No edema.      Left lower leg: No edema.   Lymphadenopathy:      Cervical: No cervical adenopathy.      Upper Body:      Right upper body: No supraclavicular, axillary or pectoral adenopathy.      Left upper body: No supraclavicular, axillary or pectoral adenopathy.   Neurological:      Mental Status: She is alert.          Assessment/Plan:       1. Breast pain, left  No mass appreciated on exam today and no mass seen on mamm/US in 01/2018  [facility] The Breast Cancer Risk Assessment Tool performed with patient (scanned) - low risk  Recommend low fat (15% of calories), high complex carbohydrate diet; tylenol 650 prn pain  Breast self awareness encouraged  - Mammo Digital Diagnostic Bilateral W Cad    2. History of gastric bypass  - Bariatric Surgery Referral: Hamid Pourshojae, DO ([place])    3. S/P gastric bypass    The following activities were performed on the date of service:  obtaining and/or reviewing the separately obtained history  performing a medically appropriate examination and/or evaluation   counseling and educating the patient/family/caregiver  ordering medications, tests, or procedures  documenting clinical information in the electronic or other health records    Total time spent performing activities on date of service:  4-  minutes          Return for Annual Physical.    Jules Husbands, DO

## 2019-03-09 NOTE — Progress Notes (Signed)
Have you seen any specialists/other providers since your last visit with [place]?    Yes, Endocrinology     Arm preference verified?   Yes    The patient is due for multiple

## 2019-03-14 ENCOUNTER — Encounter (INDEPENDENT_AMBULATORY_CARE_PROVIDER_SITE_OTHER): Payer: Self-pay

## 2019-03-15 ENCOUNTER — Encounter (INDEPENDENT_AMBULATORY_CARE_PROVIDER_SITE_OTHER): Payer: Self-pay

## 2019-03-28 ENCOUNTER — Other Ambulatory Visit (INDEPENDENT_AMBULATORY_CARE_PROVIDER_SITE_OTHER): Payer: Self-pay | Admitting: Family Medicine

## 2019-03-28 ENCOUNTER — Ambulatory Visit (INDEPENDENT_AMBULATORY_CARE_PROVIDER_SITE_OTHER): Payer: No Typology Code available for payment source | Admitting: Family

## 2019-03-28 ENCOUNTER — Telehealth (HOSPITAL_BASED_OUTPATIENT_CLINIC_OR_DEPARTMENT_OTHER): Payer: Self-pay

## 2019-03-28 ENCOUNTER — Encounter (INDEPENDENT_AMBULATORY_CARE_PROVIDER_SITE_OTHER): Payer: Self-pay | Admitting: Family

## 2019-03-28 VITALS — BP 166/89 | HR 51 | Temp 97.5°F | Ht 62.5 in | Wt 144.0 lb

## 2019-03-28 DIAGNOSIS — J209 Acute bronchitis, unspecified: Secondary | ICD-10-CM

## 2019-03-28 DIAGNOSIS — J301 Allergic rhinitis due to pollen: Secondary | ICD-10-CM

## 2019-03-28 DIAGNOSIS — K909 Intestinal malabsorption, unspecified: Secondary | ICD-10-CM

## 2019-03-28 DIAGNOSIS — R197 Diarrhea, unspecified: Secondary | ICD-10-CM

## 2019-03-28 DIAGNOSIS — E663 Overweight: Secondary | ICD-10-CM

## 2019-03-28 DIAGNOSIS — Z9884 Bariatric surgery status: Secondary | ICD-10-CM

## 2019-03-28 NOTE — Telephone Encounter (Signed)
[employer]  [id number]  BARIATRIC NON-COVERED BENEFIT  REF#[id number]

## 2019-03-28 NOTE — Progress Notes (Signed)
CONSULT NOTE    Patient Name: Tracy [name], Tracy [name]  Age: 54 y.o.  Sex: female   DOB: [birth year]  MRN: [id number]    Subjective:   Tracy [name] presents today to establish care with a bariatric practice since moving to the [place] area in 2019. She originally had a sleeve gastrectomy in 09/2013 with Dr Dedra Skeens at [facility]. Her pre-op weight was 225 lbs. She ultimately gained additional weight (240 lbs) and subsequently had a bilio-pancreatic diversion w/ duodenal switch with Dr Starleen Arms at [facility] in 12/2016. She has had appx 100 lb weight loss.  She has had ongoing diarrhea 4-12x/day since her surgery. She is tolerating a low-fat diet. She has followed up with GI - Dr Aquilla Hacker and Dr Roda Shutters who performed a colonoscopy in 01/2019. Colonoscopy revealed no significant abnormalities.   Pt also was prescribed cholestyramine for her diarrhea x1 mo though with minimal relief.   Says her diarrhea is unrelated to any particular PO intake.   It can cause her to be dehydrated and is now causing "kidney problems." She reports abdominal discomfort a/w the diarrhea.   She tolerates 6 small meals daily though is not measuring her portions. She drinks 50-60 oz daily. She inconsistently drinks her protein shakes. She exercises regularly and can walk/run up to 10 miles.       Past Medical History:     Past Medical History:   Diagnosis Date   . Anemia    . Arthritis    . Carotid stenosis    . Diarrhea 12/2016    Since I had my dual switch surgery   . Elevated liver enzymes    . Fatty liver 2017   . Fatty liver disease, nonalcoholic 2018    Since weight loss surgery   . GERD (gastroesophageal reflux disease)    . Headache    . Hepatic cirrhosis    . Hiatal hernia    . Hyperthyroidism 2013    Thyroid removed   . Sleep apnea     not using CPAP machine   . Vitamin D deficiency        Past Surgical History:     Past Surgical History:   Procedure Laterality Date   . CHOLECYSTECTOMY  12/2016   . COLONOSCOPY  02/2018   . COLONOSCOPY, BIOPSY  N/A 01/27/2018    Procedure: COLONOSCOPY, BIOPSY;  Surgeon: Shanon Brow, MD;  Location: [facility];  Service: Gastroenterology;  Laterality: N/A;   . 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 EGD, BIOPSY N/A 01/27/2018    Procedure: EGD, BIOPSY;  Surgeon: Shanon Brow, MD;  Location: [facility];  Service: Gastroenterology;  Laterality: N/A;   . GASTRECTOMY  09/2013   . HYSTERECTOMY  2015   . LAPAROSCOPIC REVISION SLEEVE GASTRECTOMY TO GASTRIC BYPASS     . LAPAROSCOPIC, GASTRECTOMY SLEEVE     . SALPINGO OOPHERECTOMY Bilateral    . THYROIDECTOMY  2013       Family History:     Family History   Problem Relation Age of Onset   . Cancer Mother         hx ovarian   . Hypertension Mother    . Inflammatory bowel disease Mother    . Heart failure Mother    . Angina Mother    . Migraines Mother    . Asthma Father    . Diabetes Father    . Hyperlipidemia Father    . Hypertension Father    .  Cancer Father         Currently has cancer   . Crohn's disease Father    . Asthma Sister    . Diabetes Sister    . Hyperlipidemia Sister    . Hypertension Sister    . Early death Sister    . Cancer Brother         hx of stomach   . Hyperlipidemia Brother    . Hypertension Brother    . Migraines Brother    . Learning disabilities Brother         Deaf   . Breast cancer Paternal Aunt         died of    . Breast cancer Other         maternal cousin-- survior   . Obesity Son        Allergies:     Allergies   Allergen Reactions   . Bee Pollen Swelling     "Bee Stings".Marland KitchenMarland KitchenMarland KitchenStuffy nose; "throat swelling"     . Vicodin [Hydrocodone-Acetaminophen] Nausea And Vomiting       Medications:     Prior to Admission medications    Medication Sig Start Date End Date Taking? Authorizing Provider   albuterol sulfate HFA (PROVENTIL) 108 (90 Base) MCG/ACT inhaler Inhale 2 puffs into the lungs every 4 (four) hours as needed for Wheezing or Shortness of Breath (cough) 11/05/18  Yes Dumlao-Umayam, January F, MD   Armour Thyroid 60 MG tablet TAKE 1  TABLET(60 MG) BY MOUTH DAILY 03/04/19  Yes Zerita Boers, MD   Beclomethasone Dipropionate (Qnasl) 80 MCG/ACT Aero Soln 2 sprays by Nasal route daily 11/05/18  Yes Dumlao-Umayam, January F, MD   Biotin 5 MG/ML Liquid Take by mouth daily      Yes [provider]   budesonide-formoterol (SYMBICORT) 160-4.5 MCG/ACT inhaler Inhale 2 puffs into the lungs 2 (two) times daily 11/25/18 11/25/19 Yes Dumlao-Umayam, January F, MD   butalbital-acetaminophen-caffeine (FIORICET, ESGIC) [phone] MG per tablet Take 1 tablet by mouth every 4 (four) hours as needed for Headaches 10/15/17  Yes Fennell, Stacy L, DO   calcium-vitamin D (OSCAL-500 + D) 500-200 MG-UNIT per tablet Take 1 tablet by mouth daily   Yes [provider]   cetirizine (ZyrTEC Allergy) 10 MG tablet Take 1 tablet (10 mg total) by mouth daily 11/05/18  Yes Dumlao-Umayam, January F, MD   cholestyramine Lanetta Inch) 4 GM/DOSE powder Take 1 packet (4 g total) by mouth daily Dissolve in 8 oz of liquid and drink before a meal 01/12/19 05/12/19 Yes Einar Grad, MD   Cyanocobalamin 5000 MCG SL Tab by Nasal route once a week   Yes [provider]   montelukast (SINGULAIR) 10 MG tablet TAKE 1 TABLET(10 MG) BY MOUTH EVERY NIGHT 12/23/18  Yes Dumlao-Umayam, January F, MD   Multiple Vitamins-Minerals (MULTIVITAMIN WITH MINERALS) tablet Take 1 tablet by mouth daily   Yes [provider]   Spacer/Aero-Holding Chambers (AEROCHAMBER Z-STAT PLUS/MEDIUM) inhaler Use as instructed 03/31/18  Yes Dumlao-Umayam, January F, MD   vitamin D (CHOLECALCIFEROL) 25 MCG (1000 UT) tablet Take 1,000 Units by mouth daily   Yes [provider]       Review of Systems:     Constitutional: Denies fevers/chills, unintentional weight loss  Head and neck: Negative for visual changes, eye pain, dry mouth, or hearing impairment  Respiratory: Negative for SOB, cough  Cardiovascular: Negative for chest pain, palpitations  Gastrointestinal: Negative for nausea, vomiting,  diarrhea, constipation, reflux, abdominal  pain.  Genitourinary: Negative for hematuria, dysuria  Musculoskeletal: Negative for bone pain, myalgias and stiff joints; denies calf pain  Skin: Denies rash, itching, skin abnormalities   Neurological: Negative for dizziness, gait changes, headaches, or memory problems  Behavioral/Psych: Negative for fatigue with loss of interest in favorite activities, sleep disturbance  Endocrine: Negative for temperature intolerance      Physical Exam:   BP 166/89   Pulse (!) 51   Temp 97.5 F (36.4 C) (Temporal)   Ht 5' 2.5"   Wt 144 lb   BMI 25.92 kg/m   BMI: Body mass index is 25.92 kg/m.  Previous Weight:   Wt Readings from Last 15 Encounters:   03/28/19 144 lb   03/09/19 144 lb   01/12/19 136 lb   08/04/18 138 lb   07/12/18 138 lb 9.6 oz   07/03/18 140 lb   03/30/18 141 lb 9.6 oz   03/04/18 141 lb 6.4 oz   03/02/18 141 lb   02/16/18 143 lb 9.6 oz   01/27/18 144 lb   01/20/18 147 lb   01/19/18 147 lb 6.4 oz   12/21/17 149 lb 6.4 oz   11/10/17 154 lb        Appearance: Comfortable, no acute distress, well nourished  HEENT: Normocephalic, clear conjunctiva   Chest: Breathing unlabored  Neuro: A&Ox3  Psych: Calm, cooperative        Labs Reviewed:     Lab Results   Component Value Date    WBC 3.17 01/20/2018    HGB 12.9 01/20/2018    HCT 41.0 01/20/2018    MCV 95.8 01/20/2018    PLT 194 01/20/2018     '  Chemistry        Component Value Date/Time    NA 142 01/20/2018 0823    K 4.1 01/20/2018 0823    CL 107 01/20/2018 0823    CO2 27 01/20/2018 0823    BUN 14.0 01/20/2018 0823    CREAT 1.0 01/20/2018 0823    GLU 86 01/20/2018 0823        Component Value Date/Time    CA 9.5 01/20/2018 0823    ALKPHOS 115 (H) 01/20/2018 0823    AST 45 (H) 01/20/2018 0823    ALT 31 01/20/2018 0823    BILITOTAL 1.5 (H) 01/20/2018 0823          Lab Results   Component Value Date    ALT 31 01/20/2018    AST 45 (H) 01/20/2018    ALKPHOS 115 (H) 01/20/2018    BILITOTAL 1.5 (H) 01/20/2018     Lab Results    Component Value Date    VITD 57 01/20/2018     Lab Results   Component Value Date    B12 715 01/20/2018     No results found for: VITAMINB1  No results found for: VITAMINARETI  Cholesterol   Date Value Ref Range Status   01/20/2018 102 0 - 199 mg/dL Final     Triglycerides   Date Value Ref Range Status   01/20/2018 37 34 - 149 mg/dL Final     HDL   Date Value Ref Range Status   01/20/2018 38 (L) 40 - 9,999 mg/dL Final     Comment:     An HDL cholesterol <40 mg/dL is low and constitutes a  coronary heart disease risk factor, and HDL-C>59 mg/dL is  a negative risk factor for CHD.  Ref: American Heart Association; Circulation 2004  Cholesterol / HDL Ratio   Date Value Ref Range Status   01/20/2018 2.7 See Below Final     Comment:     Chol/HDL Ratio:  Classification                   Female     Female  Very Low (1/2 Average Risk)      <3.4     <3.3  Low Risk                         4.0      3.8  Average Risk                     5.0      4.5  Moderate Risk (2X Average risk)  9.5      7.0  High Risk (3X Average Risk)      >23.0    >11.0       Lab Results   Component Value Date    TSH 2.73 01/11/2019     Lab Results   Component Value Date    CA 9.5 01/20/2018     Lab Results   Component Value Date    IRON 53 08/16/2017    TIBC 293 08/16/2017    FERRITIN 29.26 08/16/2017     Lab Results   Component Value Date    HGBA1C 4.7 01/20/2018       Rads:   Radiological Procedure reviewed.    Radiology Results (24 Hour)     ** No results found for the last 24 hours. **          Assessment and Plan:   Kawena Gally is 2+ years post biliopancreatic diversion w/ duodenal switch. She has done well with over 100 lbs weight loss. She continues to struggle with diarrhea post-operatively, which puts her at high risk for dehydration, fluid/electrolyte imbalance etc.   Pt to start fiber supplement. F/u w/ GI for mgmt. Labs to eval vitamins, kidneys, liver, etc.     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   Labs will dictate mgmt and f/u schedule.     1. Increase monitoring and measuring portions. Increase protein to a goal of 50-60 grams from supplements daily and Increase fluid intake for a minimum of 64 oz daily.   2. Continue exercise as tolerated.  3. Follow up with PCP regularly.   4. Medications reviewed. Continue vitamins with any changes as noted above.   5. Routine care in 1 months with labs or sooner if needed.     Patient verbalized understanding and agrees with the plan.    Total time spent : Approximately 45 minutes of which, greater than 50% was spent in counseling, behavioral modification,  and coordination of care with respect to the above mentioned diagnoses and treatment plan and answering patient's questions. All questions answered to patient's satisfaction.     Return for labs will dictate f/u schedule; PRN.      Signed by: Alesia Richards, FNP-BC

## 2019-03-31 ENCOUNTER — Ambulatory Visit
Admission: RE | Admit: 2019-03-31 | Discharge: 2019-03-31 | Disposition: A | Discharge status: Home / Self Care | Payer: No Typology Code available for payment source | Source: Ambulatory Visit | Attending: Family | Admitting: Family

## 2019-04-06 ENCOUNTER — Ambulatory Visit
Admission: RE | Admit: 2019-04-06 | Discharge: 2019-04-06 | Disposition: A | Discharge status: Home / Self Care | Payer: No Typology Code available for payment source | Source: Ambulatory Visit | Attending: Family Medicine | Admitting: Family Medicine

## 2019-04-06 ENCOUNTER — Telehealth (INDEPENDENT_AMBULATORY_CARE_PROVIDER_SITE_OTHER): Payer: Self-pay | Admitting: Family Medicine

## 2019-04-06 ENCOUNTER — Other Ambulatory Visit (INDEPENDENT_AMBULATORY_CARE_PROVIDER_SITE_OTHER): Payer: Self-pay | Admitting: Family Medicine

## 2019-04-06 DIAGNOSIS — N644 Mastodynia: Secondary | ICD-10-CM | POA: Insufficient documentation

## 2019-04-06 NOTE — Telephone Encounter (Signed)
[facility] called for a new mammo order for patient. Sw pcp and order has been entered.

## 2019-04-07 ENCOUNTER — Other Ambulatory Visit (FREE_STANDING_LABORATORY_FACILITY): Payer: No Typology Code available for payment source

## 2019-04-07 DIAGNOSIS — Z9884 Bariatric surgery status: Secondary | ICD-10-CM

## 2019-04-07 DIAGNOSIS — K909 Intestinal malabsorption, unspecified: Secondary | ICD-10-CM

## 2019-04-07 DIAGNOSIS — R197 Diarrhea, unspecified: Secondary | ICD-10-CM

## 2019-04-07 DIAGNOSIS — E663 Overweight: Secondary | ICD-10-CM

## 2019-04-07 LAB — COMPREHENSIVE METABOLIC PANEL
ALT: 26 U/L (ref 0–55)
AST (SGOT): 30 U/L (ref 5–34)
Albumin/Globulin Ratio: 1.5 (ref 0.9–2.2)
Albumin: 3.8 g/dL (ref 3.5–5.0)
Alkaline Phosphatase: 114 U/L — ABNORMAL HIGH (ref 37–106)
Anion Gap: 11 (ref 5.0–15.0)
BUN: 14 mg/dL (ref 7.0–19.0)
Bilirubin, Total: 0.9 mg/dL (ref 0.2–1.2)
CO2: 24 mEq/L (ref 21–29)
Calcium: 9.1 mg/dL (ref 8.5–10.5)
Chloride: 108 mEq/L (ref 100–111)
Creatinine: 0.9 mg/dL (ref 0.4–1.5)
Globulin: 2.5 g/dL (ref 2.0–3.7)
Glucose: 83 mg/dL (ref 70–100)
Potassium: 3.8 mEq/L (ref 3.5–5.1)
Protein, Total: 6.3 g/dL (ref 6.0–8.3)
Sodium: 143 mEq/L (ref 136–145)

## 2019-04-07 LAB — CBC
Absolute NRBC: 0 10*3/uL (ref 0.00–0.00)
Hematocrit: 34.3 % — ABNORMAL LOW (ref 34.7–43.7)
Hgb: 10.7 g/dL — ABNORMAL LOW (ref 11.4–14.8)
MCH: 30.2 pg (ref 25.1–33.5)
MCHC: 31.2 g/dL — ABNORMAL LOW (ref 31.5–35.8)
MCV: 96.9 fL — ABNORMAL HIGH (ref 78.0–96.0)
MPV: 12.1 fL (ref 8.9–12.5)
Nucleated RBC: 0 /100 WBC (ref 0.0–0.0)
Platelets: 216 10*3/uL (ref 142–346)
RBC: 3.54 10*6/uL — ABNORMAL LOW (ref 3.90–5.10)
RDW: 13 % (ref 11–15)
WBC: 2.88 10*3/uL — ABNORMAL LOW (ref 3.10–9.50)

## 2019-04-07 LAB — C-REACTIVE PROTEIN: C-Reactive Protein: <0.1 mg/dL (ref 0.0–0.8)

## 2019-04-07 LAB — FERRITIN: Ferritin: 11.25 ng/mL (ref 4.60–204.00)

## 2019-04-07 LAB — LIPID PANEL
Cholesterol / HDL Ratio: 3.1
Cholesterol: 98 mg/dL (ref 0–199)
HDL: 32 mg/dL — ABNORMAL LOW (ref 40–9999)
LDL Calculated: 59 mg/dL (ref 0–99)
Triglycerides: 34 mg/dL (ref 34–149)
VLDL Calculated: 7 mg/dL — ABNORMAL LOW (ref 10–40)

## 2019-04-07 LAB — THYROID STIMULATING HORMONE (TSH), REFLEX ON ABNORMAL TO FREE T4, SERUM: TSH, Abn Reflex to Free T4, Serum: 4.56 u[IU]/mL (ref 0.35–4.94)

## 2019-04-07 LAB — IRON PROFILE
Iron Saturation: 13 % — ABNORMAL LOW (ref 15–50)
Iron: 54 ug/dL (ref 40–145)
TIBC: 408 ug/dL (ref 265–497)
UIBC: 354 ug/dL (ref 126–382)

## 2019-04-07 LAB — PTH, INTACT: PTH Intact: 61.9 pg/mL (ref 9.0–72.0)

## 2019-04-07 LAB — HEMOLYSIS INDEX: Hemolysis Index: 3 (ref 0–18)

## 2019-04-07 LAB — FOLATE: Folate: 3.5 ng/mL

## 2019-04-07 LAB — HEMOGLOBIN A1C
Average Estimated Glucose: 82.5 mg/dL
Hemoglobin A1C: 4.5 % — ABNORMAL LOW (ref 4.6–5.9)

## 2019-04-07 LAB — GFR: EGFR: >60

## 2019-04-07 LAB — VITAMIN B12: Vitamin B-12: 827 pg/mL (ref 211–911)

## 2019-04-07 LAB — PHOSPHORUS: Phosphorus: 4 mg/dL (ref 2.3–4.7)

## 2019-04-07 LAB — VITAMIN D,25 OH,TOTAL: Vitamin D, 25 OH, Total: 51 ng/mL (ref 30–100)

## 2019-04-07 LAB — MAGNESIUM: Magnesium: 1.9 mg/dL (ref 1.6–2.6)

## 2019-04-08 LAB — ZINC: Zinc: 0.6 — ABNORMAL LOW (ref 0.66–1.10)

## 2019-04-08 LAB — COPPER, SERUM: Copper: 0.25 — ABNORMAL LOW (ref 0.75–1.45)

## 2019-04-09 LAB — CELIAC DISEASE COMP PANEL: IGA for Celiac: 291 mg/dL (ref 61–356)

## 2019-04-09 LAB — TISSUE TRANSGLUTAMINASE, IGA: Tissue Transglutaminase Antibody, IgA: 1.7

## 2019-04-09 LAB — VITAMIN K: Vitamin K1: 0.04 ng/mL — ABNORMAL LOW (ref 0.10–2.20)

## 2019-04-11 LAB — VITAMIN E: Vitamin E (Alpha Tocopherol): 4.9 mg/L — ABNORMAL LOW (ref 5.5–17.0)

## 2019-04-11 LAB — STOOL PANCREATIC ELASTASE: Stool Pancreatic Elastase-1: 213

## 2019-04-12 LAB — VITAMIN A: Vitamin A: 7.5 — ABNORMAL LOW (ref 32.5–78.0)

## 2019-04-13 LAB — STOOL CALPROTECTIN IMMUNOASSAY: Stool Calprotectin: <15.6

## 2019-04-13 LAB — VITAMIN B1, WHOLE BLOOD: Whole Blood Vitamin B1: 120 nmol/L (ref 70–180)

## 2019-04-14 ENCOUNTER — Encounter (INDEPENDENT_AMBULATORY_CARE_PROVIDER_SITE_OTHER): Payer: Self-pay

## 2019-04-14 NOTE — Progress Notes (Signed)
Dear Tracy [name],     I have reviewed your recent tests.     The results showed iron deficiency anemia.  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  Please follow-up with your PCP and the bariatric surgery for management of malabsorption, which make include IV iron supplementation therapy.      The results of other lab tests did not reveal a clear cause of diarrhea.  I suggest we start a course of antibiotic for possible small bowel bacterial overgrowth, versus a trial of pancreatic enzyme replacement therapy.  Please call my office to schedule a follow-up visit with me to discuss the options.    Please notify the office if you have any questions regarding the test results.    Einar Grad, MD  [facility]

## 2019-04-15 ENCOUNTER — Encounter (INDEPENDENT_AMBULATORY_CARE_PROVIDER_SITE_OTHER): Payer: Self-pay

## 2019-04-18 ENCOUNTER — Ambulatory Visit (INDEPENDENT_AMBULATORY_CARE_PROVIDER_SITE_OTHER): Payer: No Typology Code available for payment source | Admitting: Family

## 2019-04-18 ENCOUNTER — Encounter (INDEPENDENT_AMBULATORY_CARE_PROVIDER_SITE_OTHER): Payer: Self-pay | Admitting: Family

## 2019-04-18 VITALS — BP 160/86 | HR 52 | Temp 98.0°F | Ht 62.5 in | Wt 143.0 lb

## 2019-04-18 DIAGNOSIS — Z8639 Personal history of other endocrine, nutritional and metabolic disease: Secondary | ICD-10-CM

## 2019-04-18 DIAGNOSIS — E509 Vitamin A deficiency, unspecified: Secondary | ICD-10-CM

## 2019-04-18 DIAGNOSIS — Z9884 Bariatric surgery status: Secondary | ICD-10-CM

## 2019-04-18 DIAGNOSIS — E560 Deficiency of vitamin E: Secondary | ICD-10-CM

## 2019-04-18 DIAGNOSIS — R197 Diarrhea, unspecified: Secondary | ICD-10-CM

## 2019-04-18 DIAGNOSIS — K909 Intestinal malabsorption, unspecified: Secondary | ICD-10-CM

## 2019-04-18 DIAGNOSIS — Z87898 Personal history of other specified conditions: Secondary | ICD-10-CM

## 2019-04-18 DIAGNOSIS — E561 Deficiency of vitamin K: Secondary | ICD-10-CM

## 2019-04-18 DIAGNOSIS — E663 Overweight: Secondary | ICD-10-CM

## 2019-04-18 MED ORDER — CYANOCOBALAMIN 500 MCG/0.1ML NA SOLN
NASAL | 3 refills | Status: DC
Start: 2019-04-18 — End: 2021-08-20

## 2019-04-18 NOTE — Progress Notes (Addendum)
Progress Note    Patient Name: Tracy [name], Tracy [name]  Age: 54 y.o.  Sex: female   DOB: Apr 03, 1965  MRN: [id number]    Subjective:   Brihanna Lakner returns for follow up s/p sleeve gastrectomy in 10/2012 with Dr Dedra Skeens at [facility] and then  Subsequently bilio-pancreatic diversion w/ duodenal switch with chole with Dr Starleen Arms at [facility] in 12/2016. She has had appx 100 lb weight loss.  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 She has been following with GI, Dr Roda Shutters, and started cholestyramine x1-2 mo with some relief.   She eats 6 small meals daily, drinks 50-60 oz fluids daily, drinks 1-2 protein shakes daily w/ probiotics and she exercises regularly.   She is compliant with vitamins which include: MVI, vitamin D, calcium, B12 spray weekly, biotin.       Past Medical History:     Past Medical History:   Diagnosis Date   . Anemia    . Arthritis    . Carotid stenosis    . Diarrhea 12/2016    Since I had my dual switch surgery   . Elevated liver enzymes    . Fatty liver 2017   . Fatty liver disease, nonalcoholic 2018    Since weight loss surgery   . GERD (gastroesophageal reflux disease)    . Headache    . Hepatic cirrhosis    . Hiatal hernia    . Hyperthyroidism 2013    Thyroid removed   . Sleep apnea     not using CPAP machine   . Vitamin D deficiency        Past Surgical History:     Past Surgical History:   Procedure Laterality Date   . CHOLECYSTECTOMY  12/2016   . COLONOSCOPY  02/2018   . COLONOSCOPY, BIOPSY N/A 01/27/2018    Procedure: COLONOSCOPY, BIOPSY;  Surgeon: Shanon Brow, MD;  Location: [facility];  Service: Gastroenterology;  Laterality: N/A;   . 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 EGD, BIOPSY N/A 01/27/2018    Procedure: EGD, BIOPSY;  Surgeon: Shanon Brow, MD;  Location: [facility];  Service: Gastroenterology;  Laterality: N/A;   . GASTRECTOMY  09/2013   . HYSTERECTOMY  2015    Partial.   . LAPAROSCOPIC REVISION SLEEVE GASTRECTOMY TO  GASTRIC BYPASS     . LAPAROSCOPIC, GASTRECTOMY SLEEVE     . SALPINGO OOPHERECTOMY Bilateral    . THYROIDECTOMY  2013       Family History:     Family History   Problem Relation Age of Onset   . Cancer Mother         hx ovarian   . Hypertension Mother    . Inflammatory bowel disease Mother    . Heart failure Mother    . Angina Mother    . Migraines Mother    . Asthma Father    . Diabetes Father    . Hyperlipidemia Father    . Hypertension Father    . Cancer Father         Currently has cancer   . Crohn's disease Father    . Asthma Sister    . Diabetes Sister    . Hyperlipidemia Sister    . Hypertension Sister    . Early death Sister    . Cancer Brother         hx of stomach   . Hyperlipidemia Brother    .  Hypertension Brother    . Migraines Brother    . Learning disabilities Brother         Deaf   . Breast cancer Paternal Aunt         died of    . Breast cancer Other         maternal cousin-- survior   . Obesity Son        Social History:       Allergies:     Allergies   Allergen Reactions   . Bee Pollen Swelling     "Bee Stings".Marland KitchenMarland KitchenMarland KitchenStuffy nose; "throat swelling"     . Vicodin [Hydrocodone-Acetaminophen] Nausea And Vomiting       Medications:     Prior to Admission medications    Medication Sig Start Date End Date Taking? Authorizing Provider   albuterol sulfate HFA (PROVENTIL) 108 (90 Base) MCG/ACT inhaler Inhale 2 puffs into the lungs every 4 (four) hours as needed for Wheezing or Shortness of Breath (cough) 11/05/18  Yes Dumlao-Umayam, January F, MD   Armour Thyroid 60 MG tablet TAKE 1 TABLET(60 MG) BY MOUTH DAILY 03/04/19  Yes Zerita Boers, MD   Beclomethasone Dipropionate (Qnasl) 80 MCG/ACT Aero Soln 2 sprays by Nasal route daily 11/05/18  Yes Dumlao-Umayam, January F, MD   Biotin 5 MG/ML Liquid Take by mouth daily      Yes [provider]   budesonide-formoterol (SYMBICORT) 160-4.5 MCG/ACT inhaler Inhale 2 puffs into the lungs 2 (two) times daily 11/25/18 11/25/19 Yes Dumlao-Umayam, January F, MD    butalbital-acetaminophen-caffeine (FIORICET, ESGIC) [phone] MG per tablet Take 1 tablet by mouth every 4 (four) hours as needed for Headaches 10/15/17  Yes Fennell, Stacy L, DO   calcium-vitamin D (OSCAL-500 + D) 500-200 MG-UNIT per tablet Take 1 tablet by mouth daily   Yes [provider]   cetirizine (ZyrTEC Allergy) 10 MG tablet Take 1 tablet (10 mg total) by mouth daily 11/05/18  Yes Dumlao-Umayam, January F, MD   cholestyramine Lanetta Inch) 4 GM/DOSE powder Take 1 packet (4 g total) by mouth daily Dissolve in 8 oz of liquid and drink before a meal 01/12/19 05/12/19 Yes Einar Grad, MD   Cyanocobalamin 5000 MCG SL Tab by Nasal route once a week   Yes [provider]   montelukast (SINGULAIR) 10 MG tablet TAKE 1 TABLET(10 MG) BY MOUTH EVERY NIGHT 03/29/19  Yes Nechama Guard, Christina A, DO   Multiple Vitamins-Minerals (MULTIVITAMIN WITH MINERALS) tablet Take 1 tablet by mouth daily   Yes [provider]   Spacer/Aero-Holding Chambers (AEROCHAMBER Z-STAT PLUS/MEDIUM) inhaler Use as instructed 03/31/18  Yes Dumlao-Umayam, January F, MD   vitamin D (CHOLECALCIFEROL) 25 MCG (1000 UT) tablet Take 1,000 Units by mouth daily   Yes [provider]       Review of Systems:     Constitutional: Denies fevers/chills, unintentional weight loss  Head and neck: Negative for visual changes, eye pain, dry mouth, or hearing impairment  Respiratory: Negative for SOB, cough  Cardiovascular: Negative for chest pain, palpitations  Gastrointestinal: Negative for nausea, vomiting, diarrhea, constipation, reflux, abdominal pain.  Genitourinary: Negative for hematuria, dysuria  Musculoskeletal: Negative for bone pain, myalgias and stiff joints; denies calf pain  Skin: Denies rash, itching, skin abnormalities   Neurological: Negative for dizziness, gait changes, headaches, or memory problems  Behavioral/Psych: Negative for fatigue with loss of interest in favorite activities, sleep disturbance  Endocrine: Negative for  temperature intolerance      Physical Exam:  BP 160/86   Pulse (!) 52   Temp 98 F (36.7 C) (Temporal)   Ht 5' 2.5"   Wt 143 lb   BMI 25.74 kg/m   BMI: Body mass index is 25.74 kg/m.  Previous Weight:   Wt Readings from Last 15 Encounters:   04/18/19 143 lb   03/28/19 144 lb   03/09/19 144 lb   01/12/19 136 lb   08/04/18 138 lb   07/12/18 138 lb 9.6 oz   07/03/18 140 lb   03/30/18 141 lb 9.6 oz   03/04/18 141 lb 6.4 oz   03/02/18 141 lb   02/16/18 143 lb 9.6 oz   01/27/18 144 lb   01/20/18 147 lb   01/19/18 147 lb 6.4 oz   12/21/17 149 lb 6.4 oz        Appearance: Comfortable, no acute distress, well nourished  HEENT: Normocephalic, clear conjunctiva   Chest: Breathing unlabored  Neuro: A&Ox3  Psych: Calm, cooperative        Labs Reviewed:     Lab Results   Component Value Date    WBC 2.88 (L) 04/07/2019    HGB 10.7 (L) 04/07/2019    HCT 34.3 (L) 04/07/2019    MCV 96.9 (H) 04/07/2019    PLT 216 04/07/2019     '  Chemistry        Component Value Date/Time    NA 143 04/07/2019 1005    K 3.8 04/07/2019 1005    CL 108 04/07/2019 1005    CO2 24 04/07/2019 1005    BUN 14.0 04/07/2019 1005    CREAT 0.9 04/07/2019 1005    GLU 83 04/07/2019 1005        Component Value Date/Time    CA 9.1 04/07/2019 1005    ALKPHOS 114 (H) 04/07/2019 1005    AST 30 04/07/2019 1005    ALT 26 04/07/2019 1005    BILITOTAL 0.9 04/07/2019 1005          Lab Results   Component Value Date    ALT 26 04/07/2019    AST 30 04/07/2019    ALKPHOS 114 (H) 04/07/2019    BILITOTAL 0.9 04/07/2019     Lab Results   Component Value Date    VITD 51 04/07/2019     Lab Results   Component Value Date    B12 827 04/07/2019     No results found for: VITAMINB1  No results found for: VITAMINARETI  Cholesterol   Date Value Ref Range Status   04/07/2019 98 0 - 199 mg/dL Final   16/10/9602 540 0 - 199 mg/dL Final     Triglycerides   Date Value Ref Range Status   04/07/2019 34 34 - 149 mg/dL Final   98/11/9145 37 34 - 149 mg/dL Final     HDL   Date Value Ref Range  Status   04/07/2019 32 (L) 40 - 9,999 mg/dL Final     Comment:     An HDL cholesterol <40 mg/dL is low and constitutes a  coronary heart disease risk factor, and HDL-C>59 mg/dL is  a negative risk factor for CHD.  Ref: American Heart Association; Circulation 2004     01/20/2018 38 (L) 40 - 9,999 mg/dL Final     Comment:     An HDL cholesterol <40 mg/dL is low and constitutes a  coronary heart disease risk factor, and HDL-C>59 mg/dL is  a negative risk factor for CHD.  Ref: American Heart Association;  Circulation 2004       Cholesterol / HDL Ratio   Date Value Ref Range Status   04/07/2019 3.1 See Below Final     Comment:     Chol/HDL Ratio:  Classification                   Female     Female  Very Low (1/2 Average Risk)      <3.4     <3.3  Low Risk                         4.0      3.8  Average Risk                     5.0      4.5  Moderate Risk (2X Average risk)  9.5      7.0  High Risk (3X Average Risk)      >23.0    >11.0     01/20/2018 2.7 See Below Final     Comment:     Chol/HDL Ratio:  Classification                   Female     Female  Very Low (1/2 Average Risk)      <3.4     <3.3  Low Risk                         4.0      3.8  Average Risk                     5.0      4.5  Moderate Risk (2X Average risk)  9.5      7.0  High Risk (3X Average Risk)      >23.0    >11.0       Lab Results   Component Value Date    TSH 4.56 04/07/2019    TSH 2.73 01/11/2019     Lab Results   Component Value Date    CA 9.1 04/07/2019    PHOS 4.0 04/07/2019     Lab Results   Component Value Date    IRON 54 04/07/2019    TIBC 408 04/07/2019    FERRITIN 11.25 04/07/2019     Lab Results   Component Value Date    HGBA1C 4.5 (L) 04/07/2019       Rads:   Radiological Procedure reviewed.    Radiology Results (24 Hour)     ** No results found for the last 24 hours. **          Assessment and Plan:   Kya Mayfield is s/p sleeve gastrectomy in 10/2012 with Dr Dedra Skeens at [facility] and then subsequent bilio-pancreatic diversion w/  duodenal switch with chole with Dr Starleen Arms at [facility] in 12/2016. She has had appx 100 lb weight loss.  She is doing well with  reported dietary compliance.  Recommended consult with Dr Farrell Ours MD - Bariatric Surgery - for long term mgmt s/p BPD/DS.     Labs reviewed.    - LFTs - improving   - Anemia - inc oral iron, colonoscopy 01/2019 - negative  - Start vitamin A 10,000 units daily  - Start vitamin E 100-400 units daily (or 90-300 mcg daily)  - Start vitamin K 300 mcg daily  - Discussed risks a/w  vitamin deficiencies.   We will continue to monitor.     ETA: Pt also to start zinc/copper supplement. [facility].    1. Continue portion control. Protein goal of 50-60 grams from supplements daily and continue fluid intake for a minimum of 64 oz daily.   2. Continue exercise as tolerated.  3. Follow up with PCP regularly.   4. Medications reviewed. Continue vitamins with any changes as noted above.    5. Routine care PRN    Patient verbalized understanding and agrees with the plan.    Total time spent : Approximately 15 minutes of which, greater than 50% was spent in counseling, behavioral modification,  and coordination of care with respect to the above mentioned diagnoses and treatment plan and answering patient's questions. All questions answered to patient's satisfaction.     Return for PRN.      Signed by: Alesia Richards, FNP-BC

## 2019-04-18 NOTE — Patient Instructions (Signed)
-   Follow-up with GI  - Consult with Dr Farrell Ours MD - Bariatric Surgery  - Start vitamin A 10,000 units daily  - Start vitamin E 100-400 units daily (or 90-300 mcg daily)  - Start vitamin K 300 mcg daily

## 2019-04-21 ENCOUNTER — Encounter (INDEPENDENT_AMBULATORY_CARE_PROVIDER_SITE_OTHER): Payer: Self-pay | Admitting: Family Medicine

## 2019-04-21 ENCOUNTER — Ambulatory Visit (INDEPENDENT_AMBULATORY_CARE_PROVIDER_SITE_OTHER): Payer: No Typology Code available for payment source | Admitting: Family Medicine

## 2019-04-21 VITALS — BP 144/82 | HR 52 | Temp 97.3°F | Resp 14 | Ht 62.5 in | Wt 142.2 lb

## 2019-04-21 DIAGNOSIS — J019 Acute sinusitis, unspecified: Secondary | ICD-10-CM

## 2019-04-21 DIAGNOSIS — J309 Allergic rhinitis, unspecified: Secondary | ICD-10-CM

## 2019-04-21 DIAGNOSIS — R05 Cough: Secondary | ICD-10-CM

## 2019-04-21 DIAGNOSIS — J301 Allergic rhinitis due to pollen: Secondary | ICD-10-CM

## 2019-04-21 DIAGNOSIS — Z23 Encounter for immunization: Secondary | ICD-10-CM

## 2019-04-21 DIAGNOSIS — R059 Cough, unspecified: Secondary | ICD-10-CM

## 2019-04-21 MED ORDER — MONTELUKAST SODIUM 10 MG PO TABS
ORAL_TABLET | ORAL | 0 refills | Status: DC
Start: 2019-04-21 — End: 2022-03-03

## 2019-04-21 MED ORDER — CETIRIZINE HCL 10 MG PO TABS
10.0000 mg | ORAL_TABLET | Freq: Every day | ORAL | 0 refills | Status: DC
Start: 2019-04-21 — End: 2022-03-03

## 2019-04-21 MED ORDER — CEFUROXIME AXETIL 500 MG PO TABS
500.00 mg | ORAL_TABLET | Freq: Two times a day (BID) | ORAL | 0 refills | Status: AC
Start: 2019-04-21 — End: 2019-05-01

## 2019-04-21 NOTE — Progress Notes (Signed)
Have you seen any specialists/other providers since your last visit with us?    No    Arm preference verified?   Yes    The patient is due for shingles vaccine, pneumonia vaccine and Advance Directive

## 2019-04-21 NOTE — Progress Notes (Signed)
Subjective:    Date: 04/21/2019 8:50 AM   Patient ID: Tracy [name] is a 54 y.o. female.    Chief Complaint   Patient presents with   . Nasal Congestion     Pt is not fasting     HPI  Pt c/o productive cough  Feels sinus pressure, pressure behind eyes, and headache.   Also having rhinorrhea, ithcy eyes  Duration - 2 weeks  Hx of seasonal allergies.         Fever or chills - no      Cough - yes      Shortness of breath or difficulty breathing - yes, but feels inhalers help and is controlling.      Fatigue - yes      Muscle or body aches - no      Headache - yes      New loss of taste or smell - a little loss of smell. Denies loss of taste      Sore throat - yes      Congestion or runny nausea - yes      Nausea or vomiting -no      Diarrhea - chronic, reports no change.     No problems updated.    Patient Active Problem List   Diagnosis   . Migraine   . History of hypertension   . Abdominal cyst   . Hiatal hernia   . Hypokalemia   . S/P gastric bypass   . Fatty liver   . Postsurgical hypothyroidism   . Tubular adenoma of colon   . Allergic rhinitis   . Medication management   . Sleep difficulties   . Difficulty concentrating   . Forgetfulness   . Hx of migraine headaches   . Occipital headache   . Frontal headache   . Hx of concussion   . White matter abnormality on MRI of brain   . Hx of sleep apnea     Patient Care Team:  Jules Husbands, DO as PCP - General (Family Medicine)  Einar Grad, MD as Consulting Physician (Gastroenterology)  Zerita Boers, MD as Consulting Physician (Endocrinology, Diabetes and Metabolism)  Campbell Lerner, MD as Consulting Physician (Neurology)    Immunization History   Administered Date(s) Administered   . COVID-19 mRNA Vaccine Preservative Free 0.3 mL (PFIZER) 04/16/2019   . Tdap 01/07/2011     Current Outpatient Medications   Medication Sig Dispense Refill   . albuterol sulfate HFA (PROVENTIL) 108 (90 Base) MCG/ACT inhaler Inhale 2 puffs into the lungs every 4 (four) hours as  needed for Wheezing or Shortness of Breath (cough) 1 Inhaler 0   . Armour Thyroid 60 MG tablet TAKE 1 TABLET(60 MG) BY MOUTH DAILY 30 tablet 2   . Beclomethasone Dipropionate (Qnasl) 80 MCG/ACT Aero Soln 2 sprays by Nasal route daily 3 Inhaler 1   . Biotin 5 MG/ML Liquid Take by mouth daily        . budesonide-formoterol (SYMBICORT) 160-4.5 MCG/ACT inhaler Inhale 2 puffs into the lungs 2 (two) times daily 1 Inhaler 5   . butalbital-acetaminophen-caffeine (FIORICET, ESGIC) 50-325-40 MG per tablet Take 1 tablet by mouth every 4 (four) hours as needed for Headaches 10 tablet 0   . calcium-vitamin D (OSCAL-500 + D) 500-200 MG-UNIT per tablet Take 1 tablet by mouth daily     . cetirizine (ZyrTEC Allergy) 10 MG tablet Take 1 tablet (10 mg total) by mouth daily 90 tablet 0   .  cholestyramine (QUESTRAN) 4 GM/DOSE powder Take 1 packet (4 g total) by mouth daily Dissolve in 8 oz of liquid and drink before a meal 120 g 3   . Cyanocobalamin 500 MCG/0.1ML Solution 1 spray via nares weekly 1 Bottle 3   . montelukast (SINGULAIR) 10 MG tablet TAKE 1 TABLET(10 MG) BY MOUTH EVERY NIGHT 90 tablet 0   . Multiple Vitamins-Minerals (MULTIVITAMIN WITH MINERALS) tablet Take 1 tablet by mouth daily     . Spacer/Aero-Holding Chambers (AEROCHAMBER Z-STAT PLUS/MEDIUM) inhaler Use as instructed 1 each 0   . vitamin D (CHOLECALCIFEROL) 25 MCG (1000 UT) tablet Take 1,000 Units by mouth daily       No current facility-administered medications for this visit.     Allergies   Allergen Reactions   . Bee Pollen Swelling     "Bee Stings".Marland KitchenMarland KitchenMarland KitchenStuffy nose; "throat swelling"     . Vicodin [Hydrocodone-Acetaminophen] Nausea And Vomiting       Past Medical History:   Diagnosis Date   . Anemia    . Arthritis    . Carotid stenosis    . Diarrhea 12/2016    Since I had my dual switch surgery   . Fatty liver 2017   . GERD (gastroesophageal reflux disease)    . Hepatic cirrhosis    . Hiatal hernia    . Hyperthyroidism 2013    Thyroid removed   . Sleep apnea      not using CPAP machine   . Vitamin D deficiency      Past Surgical History:   Procedure Laterality Date   . CHOLECYSTECTOMY  12/2016   . COLONOSCOPY, BIOPSY N/A 01/27/2018    Procedure: COLONOSCOPY, BIOPSY;  Surgeon: Shanon Brow, MD;  Location: [facility];  Service: Gastroenterology;  Laterality: N/A;   . 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 EGD, BIOPSY N/A 01/27/2018    Procedure: EGD, BIOPSY;  Surgeon: Shanon Brow, MD;  Location: [facility];  Service: Gastroenterology;  Laterality: N/A;   . GASTRECTOMY  09/2013   . HYSTERECTOMY  2015    Partial.   . LAPAROSCOPIC REVISION SLEEVE GASTRECTOMY TO GASTRIC BYPASS     . LAPAROSCOPIC, GASTRECTOMY SLEEVE     . SALPINGO OOPHERECTOMY Bilateral    . THYROIDECTOMY  2013       Family History   Problem Relation Age of Onset   . Cancer Mother         hx ovarian   . Hypertension Mother    . Inflammatory bowel disease Mother    . Heart failure Mother    . Angina Mother    . Migraines Mother    . Asthma Father    . Diabetes Father    . Hyperlipidemia Father    . Hypertension Father    . Cancer Father         Currently has cancer   . Crohn's disease Father    . Asthma Sister    . Diabetes Sister    . Hyperlipidemia Sister    . Hypertension Sister    . Early death Sister    . Cancer Brother         hx of stomach   . Hyperlipidemia Brother    . Hypertension Brother    . Migraines Brother    . Learning disabilities Brother         Deaf   . Breast cancer Paternal Aunt         died of    .  Breast cancer Other         maternal cousin-- survior   . Obesity Son        Social History     Socioeconomic History   . Marital status: Significant Other     Spouse name: Not on file   . Number of children: Not on file   . Years of education: Not on file   . Highest education level: Not on file   Occupational History   . Not on file   Tobacco Use   . Smoking status: Never Smoker   . Smokeless tobacco: Never Used   . Tobacco comment: None   Substance and Sexual Activity   . Alcohol  use: No   . Drug use: No   . Sexual activity: Not Currently     Partners: Male     Birth control/protection: Post-menopausal, Rhythm   Other Topics Concern   . Dietary supplements / vitamins Yes     Comment: Biotin B12 multi vitamins D3   . Anesthesia problems No   . Blood thinners No   . Pregnant No   . Future Children No   . Number of Pregnancies? No   . Number of children Yes     Comment: 1   . Miscarriages / Abortions? No   . Eats large amounts No   . Excessive Sweets No   . Skips meals No   . Eats excessive starches No   . Snacks or grazes No   . Emotional eater No   . Eats fried food No   . Eats fast food No   . Diet Center Yes   . Doylene Bode No   . [place] Weight Loss No   . Nutri-System No   . Opti-Fast / Medi-Fast Yes   . Overeaters Anonymous No   . [facility] Yes   . TOPS No   . Weight Watchers Yes   . Atkins Yes   . Binging / Purging No   . Calorie Counting Yes   . Fasting Yes   . High Protein Yes   . Low Carb Yes   . Low Fat No   . [facility] Diet No   . Slim Fast Yes   . [facility] No   . Stationary cycle or treadmill Yes   . Gym/fitness Classes Yes   . Home exercise/video Yes   . Swimming Yes   . Weight training Yes   . Walking or running Yes   . Hospitalization No   . Hypnosis No   . Physical therapy Yes   . Psychological therapy Yes   . Residential program No   . Acutrim No   . Byetta No   . Contrave No   . Dexatrim Yes   . Diethylpropion No   . Fastin No   . Fen - Phen Yes   . Ionamin / Adipex No   . Phentermine Yes   . Qsymia No   . Prozac Yes   . Saxenda No   . Topamax No   . Wellbutrin No   . Xenical (Orlistat, Alli) No   . Other Med No   . No impairment Yes   . Walks with cane/crutch Yes   . Requires a wheelchair No   . Bedridden No   . Are you currently being treated for depression? No   . Do you snore? No   . Are you receiving any medical or psychological services?  No   . Do you ever wake up at night gasping for breath? No   . Do you have or have you been treated for an eating  disorder? No   . Anyone ever told you that you stop breathing while asleep? No   . Do you exercise regularly? Yes   . Have you or family member ever have trouble with anesthesia? Yes   Social History Narrative   . Not on file     Social Determinants of Health     Financial Resource Strain:    . Difficulty of Paying Living Expenses:    Food Insecurity:    . Worried About Programme researcher, broadcasting/film/video in the Last Year:    . Barista in the Last Year:    Transportation Needs:    . Freight forwarder (Medical):    Marland Kitchen Lack of Transportation (Non-Medical):    Physical Activity:    . Days of Exercise per Week:    . Minutes of Exercise per Session:    Stress:    . Feeling of Stress :    Social Connections:    . Frequency of Communication with Friends and Family:    . Frequency of Social Gatherings with Friends and Family:    . Attends Religious Services:    . Active Member of Clubs or Organizations:    . Attends Banker Meetings:    Marland Kitchen Marital Status:    Intimate Partner Violence:    . Fear of Current or Ex-Partner:    . Emotionally Abused:    Marland Kitchen Physically Abused:    . Sexually Abused:      The following portions of the patient's history were reviewed and updated as appropriate: allergies, current medications, past family history, past medical history, past social history, past surgical history and problem list.    Review of Systems     Objective:   BP 144/82 (BP Site: Left arm, Patient Position: Sitting, Cuff Size: Large)   Pulse (!) 52   Temp 97.3 F (36.3 C) (Temporal)   Resp 14   Ht 1.588 m (5' 2.5")   Wt 64.5 kg (142 lb 3.2 oz)   BMI 25.59 kg/m   Wt Readings from Last 3 Encounters:   04/21/19 64.5 kg (142 lb 3.2 oz)   04/18/19 64.9 kg (143 lb)   03/28/19 65.3 kg (144 lb)     Physical Exam  Vitals and nursing note reviewed.   Constitutional:       General: She is not in acute distress.  HENT:      Right Ear: Tympanic membrane normal.      Left Ear: Tympanic membrane normal.      Nose:      Right  Sinus: No maxillary sinus tenderness or frontal sinus tenderness.      Left Sinus: No maxillary sinus tenderness or frontal sinus tenderness.      Mouth/Throat:      Mouth: Mucous membranes are moist.      Pharynx: Oropharynx is clear. Uvula midline. No pharyngeal swelling, oropharyngeal exudate, posterior oropharyngeal erythema or uvula swelling.      Tonsils: No tonsillar exudate.   Eyes:      Conjunctiva/sclera: Conjunctivae normal.   Cardiovascular:      Rate and Rhythm: Normal rate and regular rhythm.      Heart sounds: S1 normal and S2 normal.   Pulmonary:      Effort: Pulmonary effort is normal.  Breath sounds: Normal breath sounds. No decreased breath sounds, wheezing, rhonchi or rales.   Musculoskeletal:      Cervical back: Neck supple.   Lymphadenopathy:      Head:      Right side of head: No submental, submandibular or tonsillar adenopathy.      Left side of head: No submental, submandibular or tonsillar adenopathy.      Cervical: No cervical adenopathy.      Upper Body:      Right upper body: No supraclavicular adenopathy.      Left upper body: No supraclavicular adenopathy.   Skin:     Findings: No rash.          Assessment/Plan:       1. Acute sinusitis, recurrence not specified, unspecified location  - cefuroxime (CEFTIN) 500 MG tablet; Take 1 tablet (500 mg total) by mouth 2 (two) times daily for 10 days  Dispense: 20 tablet; Refill: 0    2. Seasonal allergic rhinitis due to pollen  - cetirizine (ZyrTEC Allergy) 10 MG tablet; Take 1 tablet (10 mg total) by mouth daily  Dispense: 90 tablet; Refill: 0  - montelukast (SINGULAIR) 10 MG tablet; TAKE 1 TABLET(10 MG) BY MOUTH EVERY NIGHT  Dispense: 90 tablet; Refill: 0    3. Cough  - COVID-19 SYMPTOMATIC Patient  - albuterol sulfate HFA (PROVENTIL) 108 (90 Base) MCG/ACT inhaler; Inhale 2 puffs into the lungs every 4 (four) hours as needed for Wheezing or Shortness of Breath (cough)  Dispense: 1 Inhaler; Refill: 0    4. Need for shingles vaccine  -  Zoster Vaccine Recomb,Adjuvanted (IM); Standing    5. Allergic rhinitis, unspecified seasonality, unspecified trigger  - Beclomethasone Dipropionate (Qnasl) 80 MCG/ACT Aero Soln; 2 sprays by Nasal route daily  Dispense: 3 Inhaler; Refill: 1    Status: worsening  Treatment recommendations: optimize H20 hydration, nasal saline rinses twice a day and OTC Mucinex twice a day as neededDue to persistent symptoms and typical physical exam findings, recommend antibiotic therapy. Recommend treatment with Cephalosporin therapy.  Side effects of Cephalosporins discussed with patient including rash, GI upset, diarrhea, or dizziness. Complete entire course of antibiotic therapy as directed. Sinusitis, if left untreated, can proceed to decreased sense of smell, mucocele, orbital cellulitis, or CST.     Increase oral hydration and ensure adequate nutrition and rest  Signs and symptoms that warrant return/urgent/emergent evaluation discussed   Self-quarantine timeframe discussed (handout given with guidance/patient education - pending covid test)  Tylenol PRN pain/myalgia  Vitamin D/Zinc/Vitamin C for supplement support  Encouraged continue regular handwashing, masking, and social distancing       Return for Annual Physical.    Jules Husbands, DO

## 2019-04-22 LAB — COVID-19 (SARS-COV-2)(SOFT): SARS CoV 2 Overall Result: NOT DETECTED

## 2019-04-22 MED ORDER — ALBUTEROL SULFATE HFA 108 (90 BASE) MCG/ACT IN AERS
2.0000 | INHALATION_SPRAY | RESPIRATORY_TRACT | 0 refills | Status: DC | PRN
Start: 2019-04-22 — End: 2022-12-03

## 2019-04-22 MED ORDER — QNASL 80 MCG/ACT NA AERS
2.00 | INHALATION_SPRAY | Freq: Every day | NASAL | 1 refills | Status: DC
Start: 2019-04-22 — End: 2022-02-26

## 2019-04-22 NOTE — Patient Instructions (Signed)
Thank you for choosing [facility]. During your visit you received a test for COVID-19 and the test performed takes at least 3-5 days to result. The goal for results is 3-5 days, however with the current increase in testing volumes, tests can take up to 10 days for results to be ready. We appreciate your patience.    What should I do while I am waiting for my test results?  ?Take good care of yourself and be mindful of the safety of those around you:  ?Rest and stay well hydrated  .?Use acetaminophen (Tylenol) to help manage fever  .?Wash your hands often  .?Stay at home except to get medical care  .?Call ahead before seeing your doctor, any other medical provider, or seeking medical care at any healthcare facility  .?Isolate yourself as much as possible  .?Clean all "high touch" surfaces every day  .?Most people with COVID-19 infection will get better, however, a small number will develop worsening infection which may require more intensive treatment. Please contact your doctor or present to the nearest Emergency Department if you develop the warning signs of more severe infection such as:   ?A fever over 102 not controlled by acetaminophen (Tylenol)  ?Shortness of breath or chest pain  ?Worsening cough  ?Increasing weakness  ?Inability to tolerate oral fluids    ?Some general things to think about:-If you have a medical emergency and need to call 911, notify the person answering the phone that you have, or are being evaluated for COVID-19. If possible, put on a facemask before the ambulance arrives.-Persons who are placed under active monitoring or facilitated self-monitoring should follow instructions provided by their local health department or occupational health professionals, as appropriate..    When will I know the results of my COVID test?  After a minimum of 3-5 days has passed, there are multiple ways in which you can be informed about the results of your COVID 19 test results.  Please  ensure we have your most up to date contact information on file before you leave your appointment. .MyChart -you may be notified of results through your MyChart account. .The easiest way to find all test results done at an [facility] facility is through your MyChart account, [facility] online patient portal. This is also an easy way to connect with your [facility] primary care team for continued care, if this applies to you. If you do not already have MyChart activated, you will be given an activation code at the time of testing. Please note that for children age 12-17 MyChart is not available. .You may receive a call from your Primary Care Physician or the physician who ordered the test with the results. You may also receive a call from [place].  If none of the options above has helped you obtain your results, or you have additional questions, please contact the COVID 19 Notifications Call Center for further instructions at [phone], open 6 days a week, Monday-Saturday from 8:30am-5:00pm.Again, the goal for results is 3-5 days, however with the current increase in testing volumes, tests can take up to 10 days for results to be ready. We appreciate your patience.  .If you need help activating your MyChart account, please let us know.   When can I stop isolation? Until you receive your results,you should remain home and avoid contact with others (self-isolate).The decision to stop isolation precautions should be made on a case-by-case basis, in consultation with healthcare providers and state and local health departments..Here is some general   advice:  • If your test result is negative, you should stay home until you do not have a fever (without the use of fever reducing medication) or any respiratory symptoms for at least 3 days.   • If your test result is positive, you should stay home for at least 10 days from the day your symptoms started. If you are still having symptoms at the end of 10 days, continue in-home isolation  until 3 days after your symptoms stop. Talk to your healthcare provider to determine what follow-up is needed.You can also schedule telemedicine visits with your primary care physician to discuss ongoing symptoms or new concerns that may arise.  For further information on what you and others in your household should do please visit the Center for Disease Control (CDC) website @ https://www.cdc.gov/coronavirus/2019-ncov/about/steps-when-sick.html   If you are ill and need to be seen by a healthcare provider, and your primary physician is not available, the [facility] are open for walk in visits..[facility] -open M -F 8 am to 8 pm at the following locations:   [facility]: [address], [place], [place] [address] Open Daily 8am -8pm including weekends at the following location:  [facility]: [address], [place], [place] [address]  In addition, all  Emergency Departments can care for patients with all conditions, both COVID and non-COVID. Do not hesitate to seek care should you have a healthcare emergency.

## 2019-05-04 ENCOUNTER — Encounter (INDEPENDENT_AMBULATORY_CARE_PROVIDER_SITE_OTHER): Payer: Self-pay | Admitting: Family Medicine

## 2019-05-05 ENCOUNTER — Ambulatory Visit (INDEPENDENT_AMBULATORY_CARE_PROVIDER_SITE_OTHER): Payer: No Typology Code available for payment source | Admitting: Internal Medicine

## 2019-05-14 ENCOUNTER — Encounter (INDEPENDENT_AMBULATORY_CARE_PROVIDER_SITE_OTHER): Payer: Self-pay

## 2019-05-15 ENCOUNTER — Encounter (INDEPENDENT_AMBULATORY_CARE_PROVIDER_SITE_OTHER): Payer: Self-pay

## 2019-05-31 ENCOUNTER — Other Ambulatory Visit (INDEPENDENT_AMBULATORY_CARE_PROVIDER_SITE_OTHER): Payer: Self-pay | Admitting: "Endocrinology

## 2019-05-31 DIAGNOSIS — E890 Postprocedural hypothyroidism: Secondary | ICD-10-CM

## 2019-06-01 ENCOUNTER — Encounter (INDEPENDENT_AMBULATORY_CARE_PROVIDER_SITE_OTHER): Payer: Self-pay | Admitting: Internal Medicine

## 2019-06-01 ENCOUNTER — Ambulatory Visit (INDEPENDENT_AMBULATORY_CARE_PROVIDER_SITE_OTHER): Payer: No Typology Code available for payment source | Admitting: Internal Medicine

## 2019-06-01 ENCOUNTER — Other Ambulatory Visit (INDEPENDENT_AMBULATORY_CARE_PROVIDER_SITE_OTHER): Payer: Self-pay | Admitting: "Endocrinology

## 2019-06-01 VITALS — BP 125/72 | Wt 145.0 lb

## 2019-06-01 DIAGNOSIS — E890 Postprocedural hypothyroidism: Secondary | ICD-10-CM

## 2019-06-01 DIAGNOSIS — K580 Irritable bowel syndrome with diarrhea: Secondary | ICD-10-CM

## 2019-06-01 DIAGNOSIS — R197 Diarrhea, unspecified: Secondary | ICD-10-CM

## 2019-06-01 MED ORDER — RIFAXIMIN 550 MG PO TABS
550.0000 mg | ORAL_TABLET | Freq: Three times a day (TID) | ORAL | 0 refills | Status: DC
Start: 2019-06-01 — End: 2019-06-30

## 2019-06-01 NOTE — Progress Notes (Signed)
[address], [address]  [place], [place].  [address]  Phone:[phone]  Fax:[phone]      FOLLOW UP VISIT    Date of Visit: 06/01/2019 2:51 PM  Patient ID: Tracy [name] is a 54 y.o. female.  Referring Physician: @REFPROVFL @     Telehealth: Verbal consent has been obtained from the patient to conduct a video and telephone visit to minimize exposure to COVID-19: yes     Reason for Consultation:   Diarrhea    History:   Tracy [name] is a 54 y.o. female who presented for follow-up of chronic diarrhea.    - C-reactive protein,  Celiac serology, fecal calprotectin for IBD for diarrhea, fecal Giardia Antigen, stool pancreatic elastase were negative.  - Colonoscopy 01/2019 showed a nodularity in the terminal ileum however biopsies were normal.    - Trial therapy using cholestyramine for bile acid diarrhea 4 months was completed.  She initially responded to bile acid binder, did not have long-lasting improvement in diarrhea.  Still has loose, greasy stool for 2 8 times per day.    To review, she has had chronic diarrhea for more than 2 years.  She has on average 4-8 bowel months per day with loose, greasy stools, worse after eating, associate with lower abdominal cramps.  She has been following up with a outside gastroenterologist in 2019.     Previous lab tests including CBC, blood chemistries was unremarkable.  CT abdomen and pelvis in August 2019 showed findings concerning for ileitis. EGD and colonoscopy in January 2020 was unremarkable.  However the terminal ileum was not assessed.  She was advised to avoid fatty food and the use Imodium as needed for diarrhea, however the diarrhea has been persistent.  She denied seeing blood in the stool, constipation, nausea, vomiting, unintentional weight loss.    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  She had a history of cholecystectomy.    Problem List:     Patient Active Problem List   Diagnosis   . Migraine    . History of hypertension   . Abdominal cyst   . Hiatal hernia   . Hypokalemia   . S/P gastric bypass   . Fatty liver   . Postsurgical hypothyroidism   . Tubular adenoma of colon   . Allergic rhinitis   . Medication management   . Sleep difficulties   . Difficulty concentrating   . Forgetfulness   . Hx of migraine headaches   . Occipital headache   . Frontal headache   . Hx of concussion   . White matter abnormality on MRI of brain   . Hx of sleep apnea       Past Medical History:     Past Medical History:   Diagnosis Date   . Anemia    . Arthritis    . Carotid stenosis    . Diarrhea 12/2016    Since I had my dual switch surgery   . Fatty liver 2017   . GERD (gastroesophageal reflux disease)    . Hepatic cirrhosis    . Hiatal hernia    . Hyperthyroidism 2013    Thyroid removed   . Sleep apnea     not using CPAP machine   . Vitamin D deficiency        Past Surgical History:     Past Surgical History:   Procedure Laterality Date   . CHOLECYSTECTOMY  12/2016   .  COLONOSCOPY, BIOPSY N/A 01/27/2018    Procedure: COLONOSCOPY, BIOPSY;  Surgeon: Shanon Brow, MD;  Location: [facility];  Service: Gastroenterology;  Laterality: N/A;   . 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 EGD, BIOPSY N/A 01/27/2018    Procedure: EGD, BIOPSY;  Surgeon: Shanon Brow, MD;  Location: [facility];  Service: Gastroenterology;  Laterality: N/A;   . GASTRECTOMY  09/2013   . HYSTERECTOMY  2015    Partial.   . LAPAROSCOPIC REVISION SLEEVE GASTRECTOMY TO GASTRIC BYPASS     . LAPAROSCOPIC, GASTRECTOMY SLEEVE     . SALPINGO OOPHERECTOMY Bilateral    . THYROIDECTOMY  2013       Current Medications:   She  has a current medication list which includes the following prescription(s): albuterol sulfate hfa, armour thyroid, qnasl, biotin, budesonide-formoterol, butalbital-acetaminophen-caffeine, calcium-vitamin d, cetirizine, cyanocobalamin, montelukast, multivitamin with minerals, aerochamber z-stat plus/medium, and vitamin d.     Outpatient  Medications Marked as Taking for the 06/01/19 encounter (Office Visit) with Einar Grad, MD   Medication Sig Dispense Refill   . albuterol sulfate HFA (PROVENTIL) 108 (90 Base) MCG/ACT inhaler Inhale 2 puffs into the lungs every 4 (four) hours as needed for Wheezing or Shortness of Breath (cough) 1 Inhaler 0   . Armour Thyroid 60 MG tablet TAKE 1 TABLET(60 MG) BY MOUTH DAILY 30 tablet 2   . Beclomethasone Dipropionate (Qnasl) 80 MCG/ACT Aero Soln 2 sprays by Nasal route daily 3 Inhaler 1   . Biotin 5 MG/ML Liquid Take by mouth daily        . budesonide-formoterol (SYMBICORT) 160-4.5 MCG/ACT inhaler Inhale 2 puffs into the lungs 2 (two) times daily 1 Inhaler 5   . butalbital-acetaminophen-caffeine (FIORICET, ESGIC) 50-325-40 MG per tablet Take 1 tablet by mouth every 4 (four) hours as needed for Headaches 10 tablet 0   . calcium-vitamin D (OSCAL-500 + D) 500-200 MG-UNIT per tablet Take 1 tablet by mouth daily     . cetirizine (ZyrTEC Allergy) 10 MG tablet Take 1 tablet (10 mg total) by mouth daily 90 tablet 0   . Cyanocobalamin 500 MCG/0.1ML Solution 1 spray via nares weekly 1 Bottle 3   . montelukast (SINGULAIR) 10 MG tablet TAKE 1 TABLET(10 MG) BY MOUTH EVERY NIGHT 90 tablet 0   . Multiple Vitamins-Minerals (MULTIVITAMIN WITH MINERALS) tablet Take 1 tablet by mouth daily     . Spacer/Aero-Holding Chambers (AEROCHAMBER Z-STAT PLUS/MEDIUM) inhaler Use as instructed 1 each 0   . vitamin D (CHOLECALCIFEROL) 25 MCG (1000 UT) tablet Take 1,000 Units by mouth daily         Allergies:     Allergies   Allergen Reactions   . Bee Pollen Swelling     "Bee Stings".Marland KitchenMarland KitchenMarland KitchenStuffy nose; "throat swelling"     . Vicodin [Hydrocodone-Acetaminophen] Nausea And Vomiting       Family History:     Family History   Problem Relation Age of Onset   . Cancer Mother         hx ovarian   . Hypertension Mother    . Inflammatory bowel disease Mother    . Heart failure Mother    . Angina Mother    . Migraines Mother    . Asthma Father    . Diabetes  Father    . Hyperlipidemia Father    . Hypertension Father    . Cancer Father         Currently has cancer   . Crohn's disease  Father    . Asthma Sister    . Diabetes Sister    . Hyperlipidemia Sister    . Hypertension Sister    . Early death Sister    . Cancer Brother         hx of stomach   . Hyperlipidemia Brother    . Hypertension Brother    . Migraines Brother    . Learning disabilities Brother         Deaf   . Breast cancer Paternal Aunt         died of    . Breast cancer Other         maternal cousin-- survior   . Obesity Son        Social History:     Social History     Tobacco Use   . Smoking status: Never Smoker   . Smokeless tobacco: Never Used   . Tobacco comment: None   Substance Use Topics   . Alcohol use: No   . Drug use: No       Review of Systems:   Constitutional: Denies fever, chills or weight loss.  Eyes: Denies Blurred vision, eye discharge or eye pain.   ENMT: Denies changes in hearing, nasal discharge, oral lesions or sore throat.  Respiratory: Denies wheezing or cough.  Cardiovascular: Denies chest pain or palpitations.  Gastrointestinal: See HPI.  Genitourinary: Denies gross hematuria or dark urine.  Musculoskeletal: Denies back pain or joint pain.  Neurologic: Denies slurred speech, hemiplegia or focal deficit.  Psychiatric: Denies depression or anxiety.   Hematologic: Denies easy bruising.  Integumentary:  Denies skin rashes or Jaundice.    Vital Signs:   BP 125/72   Wt 65.8 kg (145 lb)   BMI 26.10 kg/m      Weight Monitoring 04/18/2019 04/21/2019 06/01/2019   Height 158.8 cm 158.8 cm -   Height Method - - -   Weight 64.864 kg 64.501 kg 65.772 kg   Weight Method - - -   BMI (calculated) 25.8 kg/m2 25.6 kg/m2 -          Physical Exam:   General appearance - Well developed and well nourished, no acute distress.  No obvious abnormalities on video exam.    Labs Reviewed:     Recent Labs     04/07/19  1005 01/20/18  0823 08/15/17  0318   WBC 2.88* 3.17 3.83   Hgb 10.7* 12.9 10.3*   Hematocrit  34.3* 41.0 31.8*   Platelets 216 194 217       Recent Labs     08/15/17  1237 08/14/17  0839   PT 14.2 14.3   PT INR 1.1 1.1       Recent Labs     04/07/19  1005 01/11/19  1637 05/04/18  1453 03/02/18  1702 01/20/18  0823 10/13/17  1619 08/25/17  1423 08/16/17  0402 08/16/17  0401 08/15/17  0318 08/14/17  0858   Sodium 143  --   --   --  142  --  142  --    < >   < > 145   Potassium 3.8  --   --   --  4.1  --  4.4  --    < >   < > 3.1*   Chloride 108  --   --   --  107  --  110  --    < >   < > 110  CO2 24  --   --   --  27  --  25  --    < >   < > 27   BUN 14.0  --   --   --  14.0  --  11.0  --    < >   < > 11.0   Creatinine 0.9  --   --   --  1.0  --  0.9  --    < >   < > 1.0   Calcium 9.1  --   --   --  9.5  --  8.7  --    < >   < > 8.4*   Albumin 3.8  --   --   --  4.1  --  3.9  --    < >   < > 3.6   Protein, Total 6.3  --   --   --  6.8  --  6.4  --    < >   < > 5.8*   Bilirubin, Total 0.9  --   --   --  1.5*  --  0.4  --    < >   < > 1.0   Alkaline Phosphatase 114*  --   --   --  115*  --  109*  --    < >   < > 102   ALT 26  --   --   --  31  --  46  --    < >   < > 37   AST (SGOT) 30  --   --   --  45*  --  54*  --    < >   < > 47*   Glucose 83  --   --   --  86  --  84  --    < >   < > 89   Lipase  --   --   --   --   --   --   --   --   --   --  <4*   TSH  --  2.73 0.35   < > 7.53*   < > 17.07*  --   --    < >  --    TSH, Abn Reflex to Free T4, Serum 4.56  --   --   --   --   --   --   --   --   --   --    Ferritin 11.25  --   --   --   --   --   --  29.26  --   --   --     < > = values in this interval not displayed.       Rads:   No results found.    GI Procedures:       Assessment and Plan:     1. Diarrhea, unspecified type    2. Irritable bowel syndrome with diarrhea     No orders of the defined types were placed in this encounter.     There are no discontinued medications.    Diagnostic data including lab tests, endoscopies, CT abdomen and pelvis, and previous physician's notes were  reviewed.    Diagnostic data reviewed: Colonoscopy January 2020 was unremarkable except for 2 small polyps which were resected.  The terminal ileum was not examined.  Repeat colonoscopy in 5 years  was recommended.  EGD in January 2020 showed post sleeve gastrectomy change, erythematous mucosa in the gastric antrum and was otherwise unremarkable.  CT abdomen and pelvis in August 2019 showed mild thickening of the distal small bowel near the terminal ileum with stranding in the adjacent mesentery, consistent with ileitis.     # Chronic diarrhea.  Associate with mild abdominal cramps. Differential diagnosis for etiology include IBS-D, Small bowel malabsorption associated with sleeve gastrectomy and gastric bypass surgery. Previous work up ruled out celiac disease, bile acid diarrhea secondary to cholecystectomy, microscopic colitis, Crohn's disease involving the small bowel.    Plan:  -Rifaximin 550 mg 3 times daily for 2 weeks for IBS-D.  -Spot fecal fat for steatorrhea.  -If rifaximin is not effective and fecal fat confirms steatorrhea, will consider a trial of pancreatic enzyme replacement for steatorrhea.  -Also may consider a course of high-dose colestipol for bile acid diarrhea.    Follow up in 3 months.  Return if symptoms worsen or fail to improve.    It is a pleasure to participate in the care of Tracy [name]. If you have any questions or concerns, please feel free to contact us at [phone].    Einar Grad, MD

## 2019-06-01 NOTE — Patient Instructions (Signed)
Patient Instructions:    -Rifaximin 550 mg 3 times daily for 2 weeks for IBS-D.  -Spot fecal fat for steatorrhea.  -If rifaximin is not effective and fecal fat confirms steatorrhea, will consider a trial of pancreatic enzyme replacement for steatorrhea.  -Also may consider a course of high-dose colestipol for bile acid diarrhea.    Diagnostic tests and referrals placed today:    Orders Placed This Encounter   Procedures   . Fat, F     Standing Status:   Future     Standing Expiration Date:   05/31/2020     Order Specific Question:   Specimen type/source/volume     Answer:   Stool/10 mL     Order Specific Question:   Release to patient     Answer:   Immediate       Start taking the following new medications prescribed today:  Orders Placed This Encounter   Medications   . rifAXIMin (XIFAXAN) 550 MG Tab     Sig: Take 1 tablet (550 mg total) by mouth 3 (three) times daily for 14 days     Dispense:  42 tablet     Refill:  0        Stop taking the following medications:  There are no discontinued medications.    ---------------

## 2019-06-03 ENCOUNTER — Telehealth (INDEPENDENT_AMBULATORY_CARE_PROVIDER_SITE_OTHER): Payer: Self-pay

## 2019-06-03 NOTE — Telephone Encounter (Signed)
Patient called and stated that her pharmacy [facility] [phone]) said her medication (rifaximin 550mg ) is not covered. they will only give her the generic and she doesn't do generic. Informed pt that PA was that was submitted 5/27 by Debby was approved and she should be able to pick up med. Pt verbalized understanding.

## 2019-06-07 ENCOUNTER — Telehealth (INDEPENDENT_AMBULATORY_CARE_PROVIDER_SITE_OTHER): Payer: Self-pay

## 2019-06-07 DIAGNOSIS — E890 Postprocedural hypothyroidism: Secondary | ICD-10-CM

## 2019-06-07 MED ORDER — ARMOUR THYROID 60 MG PO TABS
ORAL_TABLET | ORAL | 5 refills | Status: DC
Start: 2019-06-07 — End: 2019-12-05

## 2019-06-07 NOTE — Telephone Encounter (Signed)
Patient has an appointment on 07/01/19 and would like a Rx refill for Amour Thyroid to be sent to the [facility] on file.

## 2019-06-07 NOTE — Telephone Encounter (Signed)
Rx for Armour sent to [facility].

## 2019-06-07 NOTE — Addendum Note (Signed)
Addended byZerita Boers on: 06/07/2019 05:32 PM     Modules accepted: Orders

## 2019-06-14 ENCOUNTER — Encounter (INDEPENDENT_AMBULATORY_CARE_PROVIDER_SITE_OTHER): Payer: Self-pay

## 2019-06-15 ENCOUNTER — Encounter (INDEPENDENT_AMBULATORY_CARE_PROVIDER_SITE_OTHER): Payer: Self-pay

## 2019-06-30 ENCOUNTER — Other Ambulatory Visit (INDEPENDENT_AMBULATORY_CARE_PROVIDER_SITE_OTHER): Payer: Self-pay | Admitting: Internal Medicine

## 2019-06-30 DIAGNOSIS — K580 Irritable bowel syndrome with diarrhea: Secondary | ICD-10-CM

## 2019-07-01 ENCOUNTER — Telehealth (INDEPENDENT_AMBULATORY_CARE_PROVIDER_SITE_OTHER): Payer: No Typology Code available for payment source | Admitting: "Endocrinology

## 2019-07-01 DIAGNOSIS — E890 Postprocedural hypothyroidism: Secondary | ICD-10-CM

## 2019-07-01 NOTE — Progress Notes (Signed)
This is a video visit due to the COVID-19 pandemic.  Verbal consent has been obtained from the patient to conduct a video visit to minimize exposure to COVID-19.      Chief Complaint:  Chief Complaint   Patient presents with   . Hypothyroidism       HPI:  Tracy [name] is a 54 y.o. female with postsurgical hypothyroidism, s/p sleeve gastrectomy in 2014 and revision in 2018, who returns for follow up.    Tracy [name] Tracy [name] was diagnosed with hypothyroidism after total thyroidectomy in 2013 (benign).      Patient is taking Armour thyroid 60mg  daily.   Patient is taking the following supplements: Biotin, Calcium, Multivitamin, Vitamin D 1000 IU, vitamin B12 (weekly nasal spray), seaweed supplement.    She reports frequent BMs.  She reports weakness which may be related to the nutritional loss due to chronic diarrhea.  She reports decrease in muscle mass.  Her weight is stable between 135-145lbs.    She reports difficulty remembering things within the last 8 months, hot flashes, sweats, fatigue.  She is seeing a gastroenterologist.        Labs:  Component 08/15/2017 08/25/2017 10/13/2017 01/20/2018   TSH   0.35 - 4.94 uIU/mL 65.46 (H) 17.07 (H) 11.260 (H) 7.53 (H)   T4 Free  0.70 - 1.48 ng/dL 3.66 (L)  4.40    T3, Free   1.71 - 3.71 pg/mL 1.52 (L)        Component 03/02/2018   TSH  0.35 - 4.94 uIU/mL 0.08 (L)   T4 Free 0.70 - 1.48 ng/dL 3.47   T3, Free 4.25 - 3.71 pg/mL 2.78     Component 05/04/2018 01/11/2019   T3 Free   1.71 - 3.71 pg/mL 3.09 2.30   T4 Free  0.70 - 1.48 ng/dL 9.56 3.87   TSH   5.64 - 4.94 uIU/mL 0.35 2.73     Component 04/07/2019   TSH, Abn Reflex to Free T4, Serum    0.35 - 4.94 uIU/mL 4.56       Problem List:  Patient Active Problem List   Diagnosis   . Migraine   . History of hypertension   . Abdominal cyst   . Hiatal hernia   . Hypokalemia   . S/P gastric bypass   . Fatty liver   . Postsurgical hypothyroidism   . Tubular adenoma of colon   . Allergic rhinitis   . Medication management   . Sleep  difficulties   . Difficulty concentrating   . Forgetfulness   . Hx of migraine headaches   . Occipital headache   . Frontal headache   . Hx of concussion   . White matter abnormality on MRI of brain   . Hx of sleep apnea       Current Medications:  Current Outpatient Medications on File Prior to Visit   Medication Sig Dispense Refill   . albuterol sulfate HFA (PROVENTIL) 108 (90 Base) MCG/ACT inhaler Inhale 2 puffs into the lungs every 4 (four) hours as needed for Wheezing or Shortness of Breath (cough) 1 Inhaler 0   . Armour Thyroid 60 MG tablet TAKE 1 TABLET(60 MG) BY MOUTH DAILY 30 tablet 5   . Beclomethasone Dipropionate (Qnasl) 80 MCG/ACT Aero Soln 2 sprays by Nasal route daily 3 Inhaler 1   . Biotin 5 MG/ML Liquid Take by mouth daily        . budesonide-formoterol (SYMBICORT) 160-4.5 MCG/ACT inhaler Inhale 2 puffs into the  lungs 2 (two) times daily 1 Inhaler 5   . butalbital-acetaminophen-caffeine (FIORICET, ESGIC) 50-325-40 MG per tablet Take 1 tablet by mouth every 4 (four) hours as needed for Headaches 10 tablet 0   . calcium-vitamin D (OSCAL-500 + D) 500-200 MG-UNIT per tablet Take 1 tablet by mouth daily     . cetirizine (ZyrTEC Allergy) 10 MG tablet Take 1 tablet (10 mg total) by mouth daily 90 tablet 0   . Cyanocobalamin 500 MCG/0.1ML Solution 1 spray via nares weekly 1 Bottle 3   . montelukast (SINGULAIR) 10 MG tablet TAKE 1 TABLET(10 MG) BY MOUTH EVERY NIGHT 90 tablet 0   . Multiple Vitamins-Minerals (MULTIVITAMIN WITH MINERALS) tablet Take 1 tablet by mouth daily     . Spacer/Aero-Holding Chambers (AEROCHAMBER Z-STAT PLUS/MEDIUM) inhaler Use as instructed 1 each 0   . vitamin D (CHOLECALCIFEROL) 25 MCG (1000 UT) tablet Take 1,000 Units by mouth daily     . Xifaxan 550 MG Tab TAKE 1 TABLET(550 MG) BY MOUTH THREE TIMES DAILY FOR 14 DAYS 42 tablet 0     No current facility-administered medications on file prior to visit.       Allergies:  Allergies   Allergen Reactions   . Bee Pollen Swelling     "Bee  Stings".Marland KitchenMarland KitchenMarland KitchenStuffy nose; "throat swelling"     . Vicodin [Hydrocodone-Acetaminophen] Nausea And Vomiting       Past Medical History:  Past Medical History:   Diagnosis Date   . Anemia    . Arthritis    . Carotid stenosis    . Diarrhea 12/2016    Since I had my dual switch surgery   . Fatty liver 2017   . GERD (gastroesophageal reflux disease)    . Hepatic cirrhosis    . Hiatal hernia    . Hyperthyroidism 2013    Thyroid removed   . Sleep apnea     not using CPAP machine   . Vitamin D deficiency        Past Surgical History:  Past Surgical History:   Procedure Laterality Date   . CHOLECYSTECTOMY  12/2016   . COLONOSCOPY, BIOPSY N/A 01/27/2018    Procedure: COLONOSCOPY, BIOPSY;  Surgeon: Shanon Brow, MD;  Location: [facility];  Service: Gastroenterology;  Laterality: N/A;   . 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 EGD, BIOPSY N/A 01/27/2018    Procedure: EGD, BIOPSY;  Surgeon: Shanon Brow, MD;  Location: [facility];  Service: Gastroenterology;  Laterality: N/A;   . GASTRECTOMY  09/2013   . HYSTERECTOMY  2015    Partial.   . LAPAROSCOPIC REVISION SLEEVE GASTRECTOMY TO GASTRIC BYPASS     . LAPAROSCOPIC, GASTRECTOMY SLEEVE     . SALPINGO OOPHERECTOMY Bilateral    . THYROIDECTOMY  2013       Family History:  Family History   Problem Relation Age of Onset   . Cancer Mother         hx ovarian   . Hypertension Mother    . Inflammatory bowel disease Mother    . Heart failure Mother    . Angina Mother    . Migraines Mother    . Asthma Father    . Diabetes Father    . Hyperlipidemia Father    . Hypertension Father    . Cancer Father         Currently has cancer   . Crohn's disease Father    . Asthma Sister    .  Diabetes Sister    . Hyperlipidemia Sister    . Hypertension Sister    . Early death Sister    . Cancer Brother         hx of stomach   . Hyperlipidemia Brother    . Hypertension Brother    . Migraines Brother    . Learning disabilities Brother         Deaf   . Breast cancer Paternal Aunt         died of     . Breast cancer Other         maternal cousin-- survior   . Obesity Son        Social History:  Social History     Socioeconomic History   . Marital status: Significant Other     Spouse name: Not on file   . Number of children: Not on file   . Years of education: Not on file   . Highest education level: Not on file   Occupational History   . Not on file   Tobacco Use   . Smoking status: Never Smoker   . Smokeless tobacco: Never Used   . Tobacco comment: None   Vaping Use   . Vaping Use: Never used   Substance and Sexual Activity   . Alcohol use: No   . Drug use: No   . Sexual activity: Not Currently     Partners: Male     Birth control/protection: Post-menopausal, Rhythm   Other Topics Concern   . Dietary supplements / vitamins Yes     Comment: Biotin B12 multi vitamins D3   . Anesthesia problems No   . Blood thinners No   . Pregnant No   . Future Children No   . Number of Pregnancies? No   . Number of children Yes     Comment: 1   . Miscarriages / Abortions? No   . Eats large amounts No   . Excessive Sweets No   . Skips meals No   . Eats excessive starches No   . Snacks or grazes No   . Emotional eater No   . Eats fried food No   . Eats fast food No   . Diet Center Yes   . Doylene Bode No   . [place] Weight Loss No   . Nutri-System No   . Opti-Fast / Medi-Fast Yes   . Overeaters Anonymous No   . [facility] Yes   . TOPS No   . Weight Watchers Yes   . Atkins Yes   . Binging / Purging No   . Calorie Counting Yes   . Fasting Yes   . High Protein Yes   . Low Carb Yes   . Low Fat No   . [facility] Diet No   . Slim Fast Yes   . [facility] No   . Stationary cycle or treadmill Yes   . Gym/fitness Classes Yes   . Home exercise/video Yes   . Swimming Yes   . Weight training Yes   . Walking or running Yes   . Hospitalization No   . Hypnosis No   . Physical therapy Yes   . Psychological therapy Yes   . Residential program No   . Acutrim No   . Byetta No   . Contrave No   . Dexatrim Yes   . Diethylpropion No   .  Fastin No   . Fen -  Phen Yes   . Ionamin / Adipex No   . Phentermine Yes   . Qsymia No   . Prozac Yes   . Saxenda No   . Topamax No   . Wellbutrin No   . Xenical (Orlistat, Alli) No   . Other Med No   . No impairment Yes   . Walks with cane/crutch Yes   . Requires a wheelchair No   . Bedridden No   . Are you currently being treated for depression? No   . Do you snore? No   . Are you receiving any medical or psychological services? No   . Do you ever wake up at night gasping for breath? No   . Do you have or have you been treated for an eating disorder? No   . Anyone ever told you that you stop breathing while asleep? No   . Do you exercise regularly? Yes   . Have you or family member ever have trouble with anesthesia? Yes   Social History Narrative   . Not on file     Social Determinants of Health     Financial Resource Strain:    . Difficulty of Paying Living Expenses:    Food Insecurity:    . Worried About Programme researcher, broadcasting/film/video in the Last Year:    . Barista in the Last Year:    Transportation Needs:    . Freight forwarder (Medical):    Marland Kitchen Lack of Transportation (Non-Medical):    Physical Activity:    . Days of Exercise per Week:    . Minutes of Exercise per Session:    Stress:    . Feeling of Stress :    Social Connections:    . Frequency of Communication with Friends and Family:    . Frequency of Social Gatherings with Friends and Family:    . Attends Religious Services:    . Active Member of Clubs or Organizations:    . Attends Banker Meetings:    Marland Kitchen Marital Status:    Intimate Partner Violence:    . Fear of Current or Ex-Partner:    . Emotionally Abused:    Marland Kitchen Physically Abused:    . Sexually Abused:          There were no vitals taken for this visit.     BP Readings from Last 3 Encounters:   06/01/19 125/72   04/21/19 144/82   04/18/19 160/86        Wt Readings from Last 4 Encounters:   06/01/19 1416 65.8 kg (145 lb)   04/21/19 0836 64.5 kg (142 lb 3.2 oz)   04/18/19 0800 64.9 kg (143  lb)   03/28/19 1300 65.3 kg (144 lb)        Physical Exam:  GENERAL APPEARANCE: alert, in no acute distress, well developed, well nourished  HEENT: no proptosis, no scleral icterus, no conjunctival erythema  PSYCHIATRIC: normal mood, appropriate affect      Assessment/Plan:  Izora Benn is a 54 y.o. female with    1. Postsurgical hypothyroidism        1. Postoperative hypothyroidism   - Continue Armour 60mg  daily  - Check TFTs in 3-6 months      Orders Placed This Encounter   . T3, free   . T4, free   . TSH        There are no discontinued medications.     Return in about  1 year (around 06/30/2020).    Zerita Boers, MD

## 2019-07-05 ENCOUNTER — Emergency Department: Payer: No Typology Code available for payment source

## 2019-07-05 ENCOUNTER — Emergency Department
Admission: EM | Admit: 2019-07-05 | Discharge: 2019-07-05 | Disposition: A | Discharge status: Home / Self Care | Payer: No Typology Code available for payment source | Attending: Emergency Medical Services | Admitting: Emergency Medical Services

## 2019-07-05 DIAGNOSIS — R519 Headache, unspecified: Secondary | ICD-10-CM | POA: Insufficient documentation

## 2019-07-05 DIAGNOSIS — R197 Diarrhea, unspecified: Secondary | ICD-10-CM | POA: Insufficient documentation

## 2019-07-05 DIAGNOSIS — Z9049 Acquired absence of other specified parts of digestive tract: Secondary | ICD-10-CM | POA: Insufficient documentation

## 2019-07-05 DIAGNOSIS — M1990 Unspecified osteoarthritis, unspecified site: Secondary | ICD-10-CM | POA: Insufficient documentation

## 2019-07-05 DIAGNOSIS — K529 Noninfective gastroenteritis and colitis, unspecified: Secondary | ICD-10-CM

## 2019-07-05 DIAGNOSIS — E860 Dehydration: Secondary | ICD-10-CM | POA: Insufficient documentation

## 2019-07-05 LAB — URINALYSIS REFLEX TO MICROSCOPIC EXAM - REFLEX TO CULTURE
Bilirubin, UA: NEGATIVE
Blood, UA: NEGATIVE
Glucose, UA: NEGATIVE
Ketones UA: NEGATIVE
Leukocyte Esterase, UA: NEGATIVE
Nitrite, UA: NEGATIVE
Protein, UR: NEGATIVE
Specific Gravity UA: 1.014 (ref 1.001–1.035)
Urine pH: 6 (ref 5.0–8.0)
Urobilinogen, UA: 4 mg/dL — AB (ref 0.2–2.0)

## 2019-07-05 LAB — COMPREHENSIVE METABOLIC PANEL
ALT: 104 U/L — ABNORMAL HIGH (ref 0–55)
AST (SGOT): 173 U/L — ABNORMAL HIGH (ref 5–34)
Albumin/Globulin Ratio: 1.4 (ref 0.9–2.2)
Albumin: 3.7 g/dL (ref 3.5–5.0)
Alkaline Phosphatase: 123 U/L — ABNORMAL HIGH (ref 37–106)
Anion Gap: 12 (ref 5.0–15.0)
BUN: 16 mg/dL (ref 7–19)
Bilirubin, Total: 0.8 mg/dL (ref 0.2–1.2)
CO2: 20 mEq/L — ABNORMAL LOW (ref 22–29)
Calcium: 9.3 mg/dL (ref 8.5–10.5)
Chloride: 112 mEq/L — ABNORMAL HIGH (ref 100–111)
Creatinine: 0.9 mg/dL (ref 0.6–1.0)
Globulin: 2.7 g/dL (ref 2.0–3.6)
Glucose: 89 mg/dL (ref 70–100)
Potassium: 4.5 mEq/L (ref 3.5–5.1)
Protein, Total: 6.4 g/dL (ref 6.0–8.3)
Sodium: 144 mEq/L (ref 136–145)

## 2019-07-05 LAB — CBC AND DIFFERENTIAL
Absolute NRBC: 0 10*3/uL (ref 0.00–0.00)
Basophils Absolute Automated: 0.03 10*3/uL (ref 0.00–0.08)
Basophils Automated: 1 %
Eosinophils Absolute Automated: 0.15 10*3/uL (ref 0.00–0.44)
Eosinophils Automated: 5.1 %
Hematocrit: 33.5 % — ABNORMAL LOW (ref 34.7–43.7)
Hgb: 10.9 g/dL — ABNORMAL LOW (ref 11.4–14.8)
Immature Granulocytes Absolute: 0 10*3/uL (ref 0.00–0.07)
Immature Granulocytes: 0 %
Lymphocytes Absolute Automated: 1.3 10*3/uL (ref 0.42–3.22)
Lymphocytes Automated: 44.2 %
MCH: 28.8 pg (ref 25.1–33.5)
MCHC: 32.5 g/dL (ref 31.5–35.8)
MCV: 88.6 fL (ref 78.0–96.0)
MPV: 10.8 fL (ref 8.9–12.5)
Monocytes Absolute Automated: 0.3 10*3/uL (ref 0.21–0.85)
Monocytes: 10.2 %
Neutrophils Absolute: 1.16 10*3/uL (ref 1.10–6.33)
Neutrophils: 39.5 %
Nucleated RBC: 0 /100 WBC (ref 0.0–0.0)
Platelets: 232 10*3/uL (ref 142–346)
RBC: 3.78 10*6/uL — ABNORMAL LOW (ref 3.90–5.10)
RDW: 16 % — ABNORMAL HIGH (ref 11–15)
WBC: 2.94 10*3/uL — ABNORMAL LOW (ref 3.10–9.50)

## 2019-07-05 LAB — HCG, SERUM, QUALITATIVE: Hcg Qualitative: NEGATIVE

## 2019-07-05 LAB — TSH: TSH: 4.79 u[IU]/mL (ref 0.35–4.94)

## 2019-07-05 LAB — T4, FREE: T4 Free: 0.7 ng/dL (ref 0.70–1.48)

## 2019-07-05 LAB — GFR: EGFR: >60

## 2019-07-05 LAB — T3, FREE: T3, Free: 2.1 pg/mL (ref 1.71–3.71)

## 2019-07-05 MED ORDER — DIPHENHYDRAMINE HCL 50 MG/ML IJ SOLN
25.00 mg | Freq: Once | INTRAMUSCULAR | Status: AC
Start: 2019-07-05 — End: 2019-07-05
  Administered 2019-07-05: 11:00:00 25 mg via INTRAVENOUS
  Filled 2019-07-05: qty 1

## 2019-07-05 MED ORDER — KETOROLAC TROMETHAMINE 15 MG/ML IJ SOLN
15.00 mg | Freq: Once | INTRAMUSCULAR | Status: AC
Start: 2019-07-05 — End: 2019-07-05
  Administered 2019-07-05: 11:00:00 15 mg via INTRAVENOUS
  Filled 2019-07-05: qty 1

## 2019-07-05 MED ORDER — METOCLOPRAMIDE HCL 5 MG/ML IJ SOLN
10.00 mg | Freq: Once | INTRAMUSCULAR | Status: AC
Start: 2019-07-05 — End: 2019-07-05
  Administered 2019-07-05: 11:00:00 10 mg via INTRAVENOUS
  Filled 2019-07-05: qty 2

## 2019-07-05 MED ORDER — SODIUM CHLORIDE 0.9 % IV BOLUS
1000.00 mL | Freq: Once | INTRAVENOUS | Status: AC
Start: 2019-07-05 — End: 2019-07-05
  Administered 2019-07-05: 09:00:00 1000 mL via INTRAVENOUS

## 2019-07-05 MED ORDER — KETOROLAC TROMETHAMINE 15 MG/ML IJ SOLN
15.00 mg | Freq: Once | INTRAMUSCULAR | Status: AC
Start: 2019-07-05 — End: 2019-07-05
  Administered 2019-07-05: 09:00:00 15 mg via INTRAVENOUS
  Filled 2019-07-05: qty 1

## 2019-07-05 NOTE — ED Provider Notes (Signed)
[facility] EMERGENCY DEPARTMENT HISTORY AND PHYSICAL EXAM    Patient Name: Tracy [name], Tracy [name]  Encounter Date:  07/05/2019  Rendering Provider: Pete Glatter, MD  Patient DOB:  Nov 27, 1965  MRN:  [id number]    History of Presenting Illness     Historian:     54 y.o. female with h/o chronic severe diarrhea, anemia, GERD, cirrhosis, hypothyroidism presents due to ongoing severe diarrhea, dehydration, had frontal headache felt to be secondary to dehydration.  Patient reports that she has a minimum of 8 bowel movements per day, often up to 15.  Doubt more early, she feels any attempt to hydrate, take oral hydration increases her diarrhea.  She feels weak, dehydrated.   She has undergone extensive evaluation by GI, without a definitive diagnosis at this time.  Patient denies fever, travel, hematochezia.    PMD:  Jules Husbands, DO    Past Medical History     Past Medical History:   Diagnosis Date   . Anemia    . Arthritis    . Carotid stenosis    . Diarrhea 12/2016    Since I had my dual switch surgery   . Fatty liver 2017   . GERD (gastroesophageal reflux disease)    . Hepatic cirrhosis    . Hiatal hernia    . Hyperthyroidism 2013    Thyroid removed   . Sleep apnea     not using CPAP machine   . Vitamin D deficiency        Past Surgical History     Past Surgical History:   Procedure Laterality Date   . CHOLECYSTECTOMY  12/2016   . COLONOSCOPY, BIOPSY N/A 01/27/2018    Procedure: COLONOSCOPY, BIOPSY;  Surgeon: Shanon Brow, MD;  Location: [facility];  Service: Gastroenterology;  Laterality: N/A;   . 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 EGD, BIOPSY N/A 01/27/2018    Procedure: EGD, BIOPSY;  Surgeon: Shanon Brow, MD;  Location: [facility];  Service: Gastroenterology;  Laterality: N/A;   . GASTRECTOMY  09/2013   . HYSTERECTOMY  2015    Partial.   . LAPAROSCOPIC REVISION SLEEVE GASTRECTOMY TO GASTRIC BYPASS     . LAPAROSCOPIC, GASTRECTOMY SLEEVE     . SALPINGO OOPHERECTOMY Bilateral    .  THYROIDECTOMY  2013       Family History     Family History   Problem Relation Age of Onset   . Cancer Mother         hx ovarian   . Hypertension Mother    . Inflammatory bowel disease Mother    . Heart failure Mother    . Angina Mother    . Migraines Mother    . Asthma Father    . Diabetes Father    . Hyperlipidemia Father    . Hypertension Father    . Cancer Father         Currently has cancer   . Crohn's disease Father    . Asthma Sister    . Diabetes Sister    . Hyperlipidemia Sister    . Hypertension Sister    . Early death Sister    . Cancer Brother         hx of stomach   . Hyperlipidemia Brother    . Hypertension Brother    . Migraines Brother    . Learning disabilities Brother         Deaf   . Breast cancer Paternal  Aunt         died of    . Breast cancer Other         maternal cousin-- survior   . Obesity Son        Social History     Social History     Occupational History   . Not on file   Tobacco Use   . Smoking status: Never Smoker   . Smokeless tobacco: Never Used   . Tobacco comment: None   Vaping Use   . Vaping Use: Never used   Substance and Sexual Activity   . Alcohol use: No   . Drug use: No   . Sexual activity: Not Currently     Partners: Male     Birth control/protection: Post-menopausal, Rhythm       Home Medications     Home medications reviewed by ED MD     Discharge Medication List as of 07/05/2019 12:14 PM      CONTINUE these medications which have NOT CHANGED    Details   albuterol sulfate HFA (PROVENTIL) 108 (90 Base) MCG/ACT inhaler Inhale 2 puffs into the lungs every 4 (four) hours as needed for Wheezing or Shortness of Breath (cough), Starting Fri 04/22/2019, E-Rx      Armour Thyroid 60 MG tablet TAKE 1 TABLET(60 MG) BY MOUTH DAILY, E-Rx      Beclomethasone Dipropionate (Qnasl) 80 MCG/ACT Aero Soln 2 sprays by Nasal route daily, Starting Fri 04/22/2019, Normal      Biotin 5 MG/ML Liquid Take by mouth daily   , Historical Med      budesonide-formoterol (SYMBICORT) 160-4.5 MCG/ACT inhaler  Inhale 2 puffs into the lungs 2 (two) times daily, Starting Thu 11/25/2018, Until Fri 11/25/2019, E-Rx      butalbital-acetaminophen-caffeine (FIORICET, ESGIC) 50-325-40 MG per tablet Take 1 tablet by mouth every 4 (four) hours as needed for Headaches, Starting Thu 10/15/2017, Print      calcium-vitamin D (OSCAL-500 + D) 500-200 MG-UNIT per tablet Take 1 tablet by mouth daily, Historical Med      cetirizine (ZyrTEC Allergy) 10 MG tablet Take 1 tablet (10 mg total) by mouth daily, Starting Thu 04/21/2019, E-Rx      Cyanocobalamin 500 MCG/0.1ML Solution 1 spray via nares weekly, E-Rx      montelukast (SINGULAIR) 10 MG tablet TAKE 1 TABLET(10 MG) BY MOUTH EVERY NIGHT, E-Rx      Multiple Vitamins-Minerals (MULTIVITAMIN WITH MINERALS) tablet Take 1 tablet by mouth daily, Historical Med      Spacer/Aero-Holding Chambers (AEROCHAMBER Z-STAT PLUS/MEDIUM) inhaler Use as instructed, Print      vitamin D (CHOLECALCIFEROL) 25 MCG (1000 UT) tablet Take 1,000 Units by mouth daily, Historical Med      Xifaxan 550 MG Tab TAKE 1 TABLET(550 MG) BY MOUTH THREE TIMES DAILY FOR 14 DAYS, E-Rx             Review of Systems       Constitutional:  No fever positive for fatigue, weakness, dehydration  Eyes: No discharge   ENT: No ST  CV:  No CP   Resp:  No SOB or cough  GI: Positive for intermittent left lower quadrant pain, diarrhea chronic.  No vomiting.  GU: No dysuria  MS:    Skin: No rash  Neuro:  + HA  Psych:  No behavior changes  All other systems reviewed and negative    Physical Exam     BP (!) 164/91   Pulse 60  Temp 98.2 F (36.8 C) (Oral)   Resp 18   Wt 62.3 kg   SpO2 99%   BMI 24.72 kg/m         CONSTITUTIONAL   Patient is afebrile, Vital Signs Reviewed, appears fatigued, mildly dehydrated patient appears comfortable, Alert.  HEAD   Atraumatic, Normocephalic.  EYES   Eyes are normal to inspection, No discharge from eyes, Sclera are normal.  ENT   Mouth normal to inspection.  Slightly dry oral mucosa  NECK   Normal ROM,  No jugular venous distention, Normal inspection.  Supple, without meningeal signs.  RESPIRATORY CHEST    Breath sounds normal, No respiratory distress.   CARDIOVASCULAR   RRR, Heart sounds normal, Normal S1 S2.  ABDOMEN   Abdomen is nontender. No distension, No peritoneal signs.  UPPER EXTREMITY   Inspection normal, No cyanosis, No clubbing, No edema.  LOWER EXTREMITY  Inspection normal, No cyanosis, No clubbing, No edema.  NEURO   GCS is 15, No focal motor deficits. Speech is normal, appropriate.  SKIN   Skin is warm, Skin is dry, Skin is normal color.  PSYCHIATRIC   Oriented X 3, Normal affect.        ED Medications Administered     ED Medication Orders (From admission, onward)    Start Ordered     Status Ordering Provider    07/05/19 1110 07/05/19 1109  ketorolac (TORADOL) injection 15 mg  Once     Route: Intravenous  Ordered Dose: 15 mg     Last MAR action: Given Dylann Layne S    07/05/19 1110 07/05/19 1109  metoclopramide (REGLAN) injection 10 mg  Once     Route: Intravenous  Ordered Dose: 10 mg     Last MAR action: Given Kayleann Mccaffery S    07/05/19 1110 07/05/19 1109  diphenhydrAMINE (BENADRYL) injection 25 mg  Once     Route: Intravenous  Ordered Dose: 25 mg     Last MAR action: Given Daliah Chaudoin S    07/05/19 0842 07/05/19 0841  sodium chloride 0.9 % bolus 1,000 mL  Once     Route: Intravenous  Ordered Dose: 1,000 mL     Last MAR action: Stopped Blair Lundeen S    07/05/19 0842 07/05/19 0841  ketorolac (TORADOL) injection 15 mg  Once     Route: Intravenous  Ordered Dose: 15 mg     Last MAR action: Given Anjalee Cope S          Orders Placed During This Encounter     Orders Placed This Encounter   Procedures   . US Abdomen Limited RUQ   . CBC and differential   . Comprehensive metabolic panel   . UA Reflex to Micro - Reflex to Culture   . Beta HCG, Qual, Serum   . GFR       Diagnostic Study Results     The results of the diagnostic studies below were reviewed by the ED  provider:    Labs  Results     Procedure Component Value Units Date/Time    T4, free [[id number]] Collected: 07/05/19 0912     Updated: 07/05/19 1516     T4 Free 0.70 ng/dL     T3, free [[id number]] Collected: 07/05/19 0912     Updated: 07/05/19 1516     T3, Free 2.10 pg/mL     TSH [[id number]] Collected: 07/05/19 0912     Updated: 07/05/19 1516     TSH 4.79 uIU/mL  UA Reflex to Micro - Reflex to Culture [[id number]]  (Abnormal) Collected: 07/05/19 1047     Updated: 07/05/19 1057     Urine Type Urine, Clean Ca     Color, UA Yellow     Clarity, UA Clear     Specific Gravity UA 1.014     Urine pH 6.0     Leukocyte Esterase, UA Negative     Nitrite, UA Negative     Protein, UR Negative     Glucose, UA Negative     Ketones UA Negative     Urobilinogen, UA 4.0 mg/dL      Bilirubin, UA Negative     Blood, UA Negative    GFR [[id number]] Collected: 07/05/19 0847     Updated: 07/05/19 0916     EGFR >60.0    Comprehensive metabolic panel [[id number]]  (Abnormal) Collected: 07/05/19 0847    Specimen: Blood Updated: 07/05/19 0916     Glucose 89 mg/dL      BUN 16 mg/dL      Creatinine 0.9 mg/dL      Sodium 284 mEq/L      Potassium 4.5 mEq/L      Chloride 112 mEq/L      CO2 20 mEq/L      Calcium 9.3 mg/dL      Protein, Total 6.4 g/dL      Albumin 3.7 g/dL      AST (SGOT) 132 U/L      ALT 104 U/L      Alkaline Phosphatase 123 U/L      Bilirubin, Total 0.8 mg/dL      Globulin 2.7 g/dL      Albumin/Globulin Ratio 1.4     Anion Gap 12.0    Beta HCG, Qual, Serum [[id number]] Collected: 07/05/19 0847    Specimen: Blood Updated: 07/05/19 0907     Hcg Qualitative Negative    CBC and differential [[id number]]  (Abnormal) Collected: 07/05/19 0847    Specimen: Blood Updated: 07/05/19 0853     WBC 2.94 x10 3/uL      Hgb 10.9 g/dL      Hematocrit 42.5 %      Platelets 232 x10 3/uL      RBC 3.78 x10 6/uL      MCV 88.6 fL      MCH 28.8 pg      MCHC 32.5 g/dL      RDW 16 %      MPV 10.8 fL      Neutrophils 39.5 %      Lymphocytes Automated 44.2 %       Monocytes 10.2 %      Eosinophils Automated 5.1 %      Basophils Automated 1.0 %      Immature Granulocytes 0.0 %      Nucleated RBC 0.0 /100 WBC      Neutrophils Absolute 1.16 x10 3/uL      Lymphocytes Absolute Automated 1.30 x10 3/uL      Monocytes Absolute Automated 0.30 x10 3/uL      Eosinophils Absolute Automated 0.15 x10 3/uL      Basophils Absolute Automated 0.03 x10 3/uL      Immature Granulocytes Absolute 0.00 x10 3/uL      Absolute NRBC 0.00 x10 3/uL           Radiologic Studies  Radiology Results (24 Hour)     Procedure Component Value Units Date/Time    US Abdomen Limited RUQ [[id number]] Collected: 07/05/19 1018  Order Status: Completed Updated: 07/05/19 1021    Narrative:      HISTORY: Elevated LFTs. Cirrhosis. Diarrhea.    COMPARISON: CT from 08/14/2017.    FINDINGS:    Gallbladder: Surgically absent. No gallbladder fossa abnormality.  Bile ducts: Nondilated. Common bile duct measures 0.3 cm.  Liver: Normal.  Pancreas: Normal.  Aorta and IVC: Normal.  Right kidney: Measures 10.2 cm. No hydronephrosis.      Impression:         1. Status post cholecystectomy.  2. Otherwise normal examination.           :    Al Decant, MD   07/05/2019 10:19 AM          MD Attestations     I, Pete Glatter, MD, personally performed the services documented. I reviewed and confirm the accuracy of the information in this medical record.    Rendering Provider: Pete Glatter, MD      MDM and Clinical Notes     54 year old female who has had recent months severe chronic diarrhea.  She presents to the emergency department due to persistent diarrhea, felt to be dehydrated weak, headache.  ED evaluation shows mild increase in LFTs.  Abdominal ultrasound shows no acute findings.  Laboratory evaluation consistent with mild dehydration, no AKI.  Clinically, there is no signs of infection.  After IV fluids and Toradol, patient complained of persistent headache.   After she received Benadryl, Reglan and repeat Toradol,  she felt much better, headache was much improved and she was comfortable with discharge.  I encouraged close follow-up with her gastroenterologist.  We discussed the possibility of her pursuing outpatient IV fluids.    Diagnosis and Disposition     Clinical Impression  1. Dehydration    2. Chronic diarrhea    3. Intractable headache, unspecified chronicity pattern, unspecified headache type        Disposition  ED Disposition     ED Disposition Condition Date/Time Comment    Discharge  Tue Jul 05, 2019 12:13 PM [name] discharge to home/self care.    Condition at disposition: Stable          Prescriptions       Discharge Medication List as of 07/05/2019 12:14 PM             Pamala Hurry, MD  07/05/19 [phone]

## 2019-07-05 NOTE — Discharge Instructions (Signed)
Dehydration    You have been diagnosed with dehydration.    Dehydration is when your body is low on fluids (liquids). Dehydration has a variety of causes. These range from vomiting and diarrhea, to excessive (a lot of) sweating and poor appetite.    You have received intravenous (IV) fluids. These are to help fix your dehydration. It is important to keep hydrating yourself at home. Drink plenty of fluids frequently. Drink fluids that won't upset your stomach. This includes water and juice. It also includes drinks like Gatorade. Stay away from beverages like soda pop and tea. They may make you worse. Avoid caffeine and alcohol. They may cause you to lose more fluid.    YOU SHOULD SEEK MEDICAL ATTENTION IMMEDIATELY, EITHER HERE OR AT THE NEAREST EMERGENCY DEPARTMENT, IF ANY OF THE FOLLOWING OCCURS:   Fever (temperature higher than 100.70F / 38C) or shaking chills.   Constant vomiting and/or diarrhea.   Lightheadedness or fainting.   If you do not urinate (pee) for 8 or more hours.               Diarrhea    You have been diagnosed with diarrhea.    You have diarrhea when you have stools (bowel movements) that are soft or liquid, or when you have too many stools in a day. You may also have stomach cramps/ pains, nausea (feeling sick to your stomach), vomiting and fever (temperature higher than 100.70F / 38C).    Diarrhea can be caused by bacteria, viruses and parasites. People sometimes get "traveler's diarrhea." They get it when they go to other countries. It is caused by bacteria.    People with diarrhea often lose a lot of body fluids. This causes dehydration. Drink a lot of water or other fluids to stay hydrated. You may also be given medicine for your diarrhea. It will slow the diarrhea and stop the vomiting (if you have this symptom).    You should drink lots of natural juices or a sports-type drink that has electrolytes (sodium, potassium, etc). Do not drink a lot of plain water. Sugary drinks like  apple and pear juice might make the diarrhea worse.    You might be given medicine to help you with your diarrhea, nausea (feeling sick), vomiting and stomach cramps. This will depend on your age, medical history and symptoms.    It is OK for you to go home today.    Good hygiene (keeping clean) helps to keep the problem from spreading. Please wash your hands often, using soap and water, especially after using the bathroom. Do not prepare food or share food, drinks or utensils (forks, knives, etc.) with other people.    It is very important that you follow up with your regular doctor or a specialist. The doctor will tell you how soon this follow-up needs to be.    YOU SHOULD SEEK MEDICAL ATTENTION IMMEDIATELY, EITHER HERE OR AT THE NEAREST EMERGENCY DEPARTMENT, IF ANY OF THE FOLLOWING OCCURS:   You are vomiting and cannot keep down fluids.   There is blood, pus or mucous in your stool.   You have symptoms of dehydration. These include dry mouth, not urinating (peeing) at least once every eight hours, feeling dizzy/lightheaded, severe weakness or passing out.               CNS Symptoms NOS (Obs)    You have symptoms that seem like they involve your nervous system.    Symptoms that involve the nervous  system may or may not happen along with a headache. Symptoms may include:   Numbness: This is when a part of your body feels like it is asleep. This feeling might be like pins and needles or tingling. Some say it feels like the arm or leg is "asleep."   Weakness: This is different from feeling tired or fatigued. It may feel like your arm or leg or one half of your face is not working right. It may feel like your leg can't support your weight. It might feel like your hand is weak. You might have trouble picking up or holding onto things. One side of your face might droop. You may not be able to raise the corners of your mouth to smile.   Coordination or balance problems: This is called "ataxia." It looks like  how a very intoxicated (drunk) person might move. There may also be problems with balance or falling to one side.   Confusion: It may suddenly seem your thoughts are not clear. You may suddenly have problems with your short-term memory.   Speech problems: This is called "expressive aphasia." You may have trouble getting words out. You know what you want to say. However, you can't make the words come out. You may have other speech problems. This includes slurring your words. Slurring can sound like how an intoxicated (drunk) person talks.    The list of problems described above is not complete. There are many other symptoms that can seem like they are from a nervous system problem.     The doctor made many complex choices about how to figure out your problem. The doctor may decide to do special tests. He or she may want to talk about your case with another doctor. This will depend on many different things. This includes the type of symptoms you have. You may need to see a specialist. This could be with a neurologist (brain and nervous system specialist), for example. You may have had special tests like a CT scan or MRI. You may have had an ultrasound of your neck or heart arteries. You may have had a spinal tap. If you had these tests or saw a specialist, the doctor will talk to you about this.    You had an extended stay in an observation unit. Medical problems can be very complex. This can be especially true if they involve the brain. It can be very hard to figure some conditions out. It can be hard even after many tests. Even after an observation stay, re-exams and repeat testing, you may still have a serious medical problem.     After your stay, the doctor thinks it is OK for you to go home. You will get more instructions about the problem you came in for.    See your regular or referral doctor. The medical staff here will give you instructions about this. You may have scheduled tests. These could be for  an ultrasound, MRI, etc. If so, you need to have the tests done.    Come to the Emergency Department again if your symptoms get worse or you think you still have a serious medical problem.    YOU SHOULD SEEK MEDICAL ATTENTION IMMEDIATELY, EITHER HERE OR AT THE NEAREST EMERGENCY DEPARTMENT, IF ANY OF THE FOLLOWING OCCURS:   Numbness (loss of feeling) or tingling (pins and needles feeling) in your arm, leg or face.   Any part is weak or has paralysis (cannot move).   Coordination or balance  problems.   Bad dizziness (spinning sensation).   You have trouble speaking or can't express yourself all of a sudden.   Trouble swallowing.   Change in how alert you generally are.   Severe headache.     Thank you for choosing [facility] for your emergency care needs.  We strive to provide EXCELLENT care to you and your family.      If you do not continue to improve or your condition worsens, please contact your doctor or return immediately to the Emergency Department.    ONSITE PHARMACY  Our full service onsite pharmacy is a 2 minute walk from the ER.  Open Mon to Fri from 8 am to 8 pm, Sat and Sun 9 am to 5 pm. Ask an ED staff member for directions.  We accept all major insurances and prices are competitive with major retailers.  Ask your provider to print your prescriptions down to the pharmacy to speed you on your way home.      DOCTOR REFERRALS  Call [phone] (available 24 hours a day, 7 days a week) if you need any further referrals and we can help you find a primary care doctor or specialist.  Also, available online at:  [email or web address]    YOUR CONTACT INFORMATION  Before leaving please check with registration to make sure we have an up-to-date contact number.  You can call registration at [phone] to update your information.  For questions about your hospital bill, please call [phone].  For questions about your Emergency Dept Physician bill please call [phone].      FREE HEALTH SERVICES  If you need help with health or social services, please call 2-1-1 for a free referral to resources in your area.  2-1-1 is a free service connecting people with information on health insurance, free clinics, pregnancy, mental health, dental care, food assistance, housing, and substance abuse counseling.  Also, available online at:  http://www.[facility].org    MEDICAL RECORDS AND TESTS  Certain laboratory test results do not come back the same day, for example urine cultures.   We will contact you if other important findings are noted.  Radiology films are often reviewed again to ensure accuracy.  If there is any discrepancy, we will notify you.      Please call [phone] to pick up a complimentary CD of any radiology studies performed.  If you or your doctor would like to request a copy of your medical records, please call [phone].          ORTHOPEDIC INJURY   Please know that significant injuries can exist even when an initial x-ray is read as normal or negative.  This can occur because some fractures (broken bones) are not initially visible on x-rays.  For this reason, close outpatient follow-up with your primary care doctor or bone specialist (orthopedist) is required.    MEDICATIONS AND FOLLOWUP  Please be aware that some prescription medications can cause drowsiness.  Use caution when driving or operating machinery.    The examination and treatment you have received in our Emergency Department is provided on an emergency basis, and is not intended to be a substitute for your primary care physician.  It is important that your doctor checks you again and that you report any new or remaining problems at that time.      [address] [facility]  [facility] - [address] [place], [place] [address] (1.4 miles, 7 minutes)  Handout with directions available on request      ASSISTANCE WITH INSURANCE    Affordable Care Act  [facility])  Call to start or finish an application, compare plans,  enroll or ask a question.  [phone]  TTY: [phone]  Web:  Healthcare.gov    Help Enrolling in [facility]  [employer] [place]  (219)185-6624 (TOLL-FREE)  701-673-1599 (TTY)  Web:  Http://www.coverva.org    Local Help Enrolling in the [facility]  [place] [facility]  267-037-3355 (MAIN)  Email:  health-help@nvfs .org  Web:  [employer]jackMyths.is  Address:  [address] [place], [place] [address]

## 2019-07-05 NOTE — ED Triage Notes (Addendum)
Left abd pain, diarrhea  Chronic but worse over past 5 days causing generalized weakness.  Pt feels dehydrated headache continuous

## 2019-07-06 LAB — T3 UPTAKE: T3 Uptake: 33 % (ref 22–35)

## 2019-07-08 ENCOUNTER — Telehealth (INDEPENDENT_AMBULATORY_CARE_PROVIDER_SITE_OTHER): Payer: Self-pay | Admitting: Family Medicine

## 2019-07-08 NOTE — Telephone Encounter (Signed)
Attempted to call patient for ED follow up visit. LMTCB.

## 2019-07-14 ENCOUNTER — Encounter (INDEPENDENT_AMBULATORY_CARE_PROVIDER_SITE_OTHER): Payer: Self-pay

## 2019-07-14 ENCOUNTER — Encounter (INDEPENDENT_AMBULATORY_CARE_PROVIDER_SITE_OTHER): Payer: Self-pay | Admitting: Family Medicine

## 2019-07-14 ENCOUNTER — Ambulatory Visit (INDEPENDENT_AMBULATORY_CARE_PROVIDER_SITE_OTHER): Payer: No Typology Code available for payment source | Admitting: Family Medicine

## 2019-07-14 VITALS — BP 146/81 | HR 54 | Temp 98.0°F | Resp 16 | Wt 138.0 lb

## 2019-07-14 DIAGNOSIS — K760 Fatty (change of) liver, not elsewhere classified: Secondary | ICD-10-CM

## 2019-07-14 DIAGNOSIS — K580 Irritable bowel syndrome with diarrhea: Secondary | ICD-10-CM | POA: Insufficient documentation

## 2019-07-14 DIAGNOSIS — K529 Noninfective gastroenteritis and colitis, unspecified: Secondary | ICD-10-CM

## 2019-07-14 DIAGNOSIS — Z23 Encounter for immunization: Secondary | ICD-10-CM

## 2019-07-14 DIAGNOSIS — M67911 Unspecified disorder of synovium and tendon, right shoulder: Secondary | ICD-10-CM

## 2019-07-14 MED ORDER — DICLOFENAC SODIUM 1 % EX GEL
CUTANEOUS | 1 refills | Status: AC
Start: 2019-07-14 — End: ?

## 2019-07-14 NOTE — Progress Notes (Signed)
Subjective:    Date: 07/14/2019 7:02 AM   Patient ID: Tracy [name] is a 54 y.o. female.    Chief Complaint   Patient presents with   . Follow-up     ED visit on 07/05/2019 at [facility] hospital for abdominal pain and headache.   . Shoulder Pain     right side x 2 months      HPI   Pt presented to ED on 07/05/2019 with c/o dehydration, headache, and weakness felt to be related to chronic diarrhea. Abdominal ultrasound was performed with no acute findings.  Given IV fluids, toradol, benadryl, reglan.  She felt better and was discharged from ED. ED records have been reviewed.   Pt has been under evaluation by GI Dr. Einar Grad.  Cause of chronic is not completely clear.  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 Also felt to have IBS-D.  Recently started on Rifaximin 550 mg three tims daily for 2 weeks end of June.  Pt states this medication made her nauseous so although she has been taking it, she is not taking as frequently as prescribed so is still taking although almost finished.  She does feel it has lessened bowel movements from "11 times a day to about 5 times a day."   She does report feeling better since ED visit. She is trying to improve her diet to more plant based diet.     Pt c/o right shoulder pain, felt it was from carrying purse. Has reduced flexion  Also feels may be related to covid vaccination.   No history of shoulder problem.    Has difficulty twisting due to weakness.   Duration: about 1 month.     No problems updated.    Patient Active Problem List   Diagnosis   . Migraine   . History of hypertension   . Abdominal cyst   . Hiatal hernia   . Hypokalemia   . S/P gastric bypass   . Fatty liver   . Postsurgical hypothyroidism   . Tubular adenoma of colon   . Allergic rhinitis   . Medication management   . Sleep difficulties   . Difficulty concentrating   . Forgetfulness   . Hx of migraine headaches   . Occipital headache   . Frontal headache    . Hx of concussion   . White matter abnormality on MRI of brain   . Hx of sleep apnea     Patient Care Team:  Jules Husbands, DO as PCP - General (Family Medicine)  Einar Grad, MD as Consulting Physician (Gastroenterology)  Zerita Boers, MD as Consulting Physician (Endocrinology, Diabetes and Metabolism)  Campbell Lerner, MD as Consulting Physician (Neurology)    Immunization History   Administered Date(s) Administered   . COVID-19 mRNA Vaccine Preservative Free 0.3 mL (PFIZER) 04/16/2019, 05/14/2019   . Pneumococcal 23 valent 07/14/2019   . Tdap 01/07/2011   . Zoster [facility]) Vaccine Recombinant 07/14/2019     Current Outpatient Medications   Medication Sig Dispense Refill   . albuterol sulfate HFA (PROVENTIL) 108 (90 Base) MCG/ACT inhaler Inhale 2 puffs into the lungs every 4 (four) hours as needed for Wheezing or Shortness of Breath (cough) 1 Inhaler 0   . Armour Thyroid 60 MG tablet TAKE 1 TABLET(60 MG) BY MOUTH DAILY 30 tablet 5   . Beclomethasone Dipropionate (Qnasl) 80 MCG/ACT Aero Soln 2 sprays by Nasal route daily 3 Inhaler 1   .  Biotin 5 MG/ML Liquid Take by mouth daily        . budesonide-formoterol (SYMBICORT) 160-4.5 MCG/ACT inhaler Inhale 2 puffs into the lungs 2 (two) times daily 1 Inhaler 5   . butalbital-acetaminophen-caffeine (FIORICET, ESGIC) 50-325-40 MG per tablet Take 1 tablet by mouth every 4 (four) hours as needed for Headaches 10 tablet 0   . calcium-vitamin D (OSCAL-500 + D) 500-200 MG-UNIT per tablet Take 1 tablet by mouth daily     . cetirizine (ZyrTEC Allergy) 10 MG tablet Take 1 tablet (10 mg total) by mouth daily 90 tablet 0   . Cyanocobalamin 500 MCG/0.1ML Solution 1 spray via nares weekly 1 Bottle 3   . montelukast (SINGULAIR) 10 MG tablet TAKE 1 TABLET(10 MG) BY MOUTH EVERY NIGHT 90 tablet 0   . Multiple Vitamins-Minerals (MULTIVITAMIN WITH MINERALS) tablet Take 1 tablet by mouth daily     . Spacer/Aero-Holding Chambers (AEROCHAMBER Z-STAT PLUS/MEDIUM) inhaler Use as  instructed 1 each 0   . vitamin D (CHOLECALCIFEROL) 25 MCG (1000 UT) tablet Take 1,000 Units by mouth daily     . Xifaxan 550 MG Tab TAKE 1 TABLET(550 MG) BY MOUTH THREE TIMES DAILY FOR 14 DAYS 42 tablet 0   . diclofenac Sodium (VOLTAREN) 1 % Gel topical gel Apply 4 g to affected area 4 times daily as needed.  Max: 16 g/joint/day.  Do not apply to broken, inflamed, or infected skin. 100 g 1     No current facility-administered medications for this visit.     Allergies   Allergen Reactions   . Bee Pollen Swelling     "Bee Stings".Marland KitchenMarland KitchenMarland KitchenStuffy nose; "throat swelling"     . Vicodin [Hydrocodone-Acetaminophen] Nausea And Vomiting       Past Medical History:   Diagnosis Date   . Anemia    . Arthritis    . Carotid stenosis    . Diarrhea 12/2016    Since I had my dual switch surgery   . Fatty liver 2017   . GERD (gastroesophageal reflux disease)    . Hepatic cirrhosis    . Hiatal hernia    . Hyperthyroidism 2013    Thyroid removed   . Sleep apnea     not using CPAP machine   . Vitamin D deficiency      Past Surgical History:   Procedure Laterality Date   . CHOLECYSTECTOMY  12/2016   . COLONOSCOPY, BIOPSY N/A 01/27/2018    Procedure: COLONOSCOPY, BIOPSY;  Surgeon: Shanon Brow, MD;  Location: [facility];  Service: Gastroenterology;  Laterality: N/A;   . 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 EGD, BIOPSY N/A 01/27/2018    Procedure: EGD, BIOPSY;  Surgeon: Shanon Brow, MD;  Location: [facility];  Service: Gastroenterology;  Laterality: N/A;   . GASTRECTOMY  09/2013   . HYSTERECTOMY  2015    Partial.   . LAPAROSCOPIC REVISION SLEEVE GASTRECTOMY TO GASTRIC BYPASS     . LAPAROSCOPIC, GASTRECTOMY SLEEVE     . SALPINGO OOPHERECTOMY Bilateral    . THYROIDECTOMY  2013       Family History   Problem Relation Age of Onset   . Cancer Mother         hx ovarian   . Hypertension Mother    . Inflammatory bowel disease Mother    . Heart failure Mother    . Angina Mother    . Migraines Mother    . Asthma Father    . Diabetes  Father    . Hyperlipidemia Father    . Hypertension Father    . Cancer Father         Currently has cancer   . Crohn's disease Father    . Asthma Sister    . Diabetes Sister    . Hyperlipidemia Sister    . Hypertension Sister    . Early death Sister    . Cancer Brother         hx of stomach   . Hyperlipidemia Brother    . Hypertension Brother    . Migraines Brother    . Learning disabilities Brother         Deaf   . Breast cancer Paternal Aunt         died of    . Breast cancer Other         maternal cousin-- survior   . Obesity Son        Social History     Socioeconomic History   . Marital status: Significant Other     Spouse name: Not on file   . Number of children: Not on file   . Years of education: Not on file   . Highest education level: Not on file   Occupational History   . Not on file   Tobacco Use   . Smoking status: Never Smoker   . Smokeless tobacco: Never Used   . Tobacco comment: None   Vaping Use   . Vaping Use: Never used   Substance and Sexual Activity   . Alcohol use: No   . Drug use: No   . Sexual activity: Not Currently     Partners: Male     Birth control/protection: Post-menopausal, Rhythm   Other Topics Concern   . Dietary supplements / vitamins Yes     Comment: Biotin B12 multi vitamins D3   . Anesthesia problems No   . Blood thinners No   . Pregnant No   . Future Children No   . Number of Pregnancies? No   . Number of children Yes     Comment: 1   . Miscarriages / Abortions? No   . Eats large amounts No   . Excessive Sweets No   . Skips meals No   . Eats excessive starches No   . Snacks or grazes No   . Emotional eater No   . Eats fried food No   . Eats fast food No   . Diet Center Yes   . Doylene Bode No   . [place] Weight Loss No   . Nutri-System No   . Opti-Fast / Medi-Fast Yes   . Overeaters Anonymous No   . [facility] Yes   . TOPS No   . Weight Watchers Yes   . Atkins Yes   . Binging / Purging No   . Calorie Counting Yes   . Fasting Yes   . High Protein Yes   . Low Carb  Yes   . Low Fat No   . [facility] Diet No   . Slim Fast Yes   . [facility] No   . Stationary cycle or treadmill Yes   . Gym/fitness Classes Yes   . Home exercise/video Yes   . Swimming Yes   . Weight training Yes   . Walking or running Yes   . Hospitalization No   . Hypnosis No   . Physical therapy Yes   . Psychological  therapy Yes   . Residential program No   . Acutrim No   . Byetta No   . Contrave No   . Dexatrim Yes   . Diethylpropion No   . Fastin No   . Fen - Phen Yes   . Ionamin / Adipex No   . Phentermine Yes   . Qsymia No   . Prozac Yes   . Saxenda No   . Topamax No   . Wellbutrin No   . Xenical (Orlistat, Alli) No   . Other Med No   . No impairment Yes   . Walks with cane/crutch Yes   . Requires a wheelchair No   . Bedridden No   . Are you currently being treated for depression? No   . Do you snore? No   . Are you receiving any medical or psychological services? No   . Do you ever wake up at night gasping for breath? No   . Do you have or have you been treated for an eating disorder? No   . Anyone ever told you that you stop breathing while asleep? No   . Do you exercise regularly? Yes   . Have you or family member ever have trouble with anesthesia? Yes   Social History Narrative   . Not on file     Social Determinants of Health     Financial Resource Strain:    . Difficulty of Paying Living Expenses:    Food Insecurity:    . Worried About Programme researcher, broadcasting/film/video in the Last Year:    . Barista in the Last Year:    Transportation Needs:    . Freight forwarder (Medical):    Marland Kitchen Lack of Transportation (Non-Medical):    Physical Activity:    . Days of Exercise per Week:    . Minutes of Exercise per Session:    Stress:    . Feeling of Stress :    Social Connections:    . Frequency of Communication with Friends and Family:    . Frequency of Social Gatherings with Friends and Family:    . Attends Religious Services:    . Active Member of Clubs or Organizations:    . Attends Banker Meetings:     Marland Kitchen Marital Status:    Intimate Partner Violence:    . Fear of Current or Ex-Partner:    . Emotionally Abused:    Marland Kitchen Physically Abused:    . Sexually Abused:      The following portions of the patient's history were reviewed and updated as appropriate: allergies, current medications, past family history, past medical history, past social history, past surgical history and problem list.    Review of Systems     Objective:   BP 146/81 (BP Site: Left arm, Patient Position: Sitting, Cuff Size: Medium)   Pulse (!) 54   Temp 98 F (36.7 C) (Temporal)   Resp 16   Wt 62.6 kg (138 lb)   LMP  (LMP Unknown)   BMI 24.84 kg/m   Wt Readings from Last 3 Encounters:   07/14/19 62.6 kg (138 lb)   07/05/19 62.3 kg (137 lb 5.6 oz)   06/01/19 65.8 kg (145 lb)     Physical Exam  Vitals reviewed.   Constitutional:       General: She is not in acute distress.     Appearance: She is not ill-appearing, toxic-appearing or diaphoretic.   Musculoskeletal:  Right shoulder: Tenderness and bony tenderness present. No swelling, deformity, effusion or crepitus. Decreased range of motion. Normal strength.      Left shoulder: No deformity. Normal range of motion.      Comments: Right shoulder - generalized tenderness to palpation (anterior, lateral, posterior)    No scapular winging    Positive Neer and Hawkins    No induced biceps tendon pain    Strong external rotation muscle strength    Neurological:      Mental Status: She is alert.          Assessment/Plan:       1. Chronic diarrhea  Pt has had extensive work-up in the past - ongoing evaluation with GI and recently prescribed rifaximin   Case was discussed with her gastroenterologist Dr. Roda Shutters today  Will recommend Imodium 2-4 mg every 6 hours up to 16 mg /day  Outpatient IV fluids not recommended at this time, favor oral hydration    2. Irritable bowel syndrome with diarrhea  As above    3. Tendinopathy of rotator cuff, right  - XR Shoulder Right 2+ Views  - XR Shoulder Right Axillary  View  - diclofenac Sodium (VOLTAREN) 1 % Gel topical gel; Apply 4 g to affected area 4 times daily as needed.  Max: 16 g/joint/day.  Do not apply to broken, inflamed, or infected skin.  Dispense: 100 g; Refill: 1  -     Referral to Physical Therapy-[facility]  Trial PT for ~ 8 weeks, then reassess  Risk & Benefits of the new medication(s) were explained to the patient (and family) who verbalized understanding & agreed to the treatment plan. Patient (family) encouraged to contact me/clinical staff with any questions/concerns    4. Fatty liver    5. Need for pneumococcal vaccine  - Pneumococcal polysaccharide vaccine 23-valent greater than or equal to 2yo subcutaneous/IM    6. Need for shingles vaccine  - Zoster Vaccine Recomb,Adjuvanted (IM)    The following activities were performed on the date of service:  preparing to see the patient: - chart review   - review of prior imaging tests  - review of consultant reports  - review of prior ED visits  obtaining and/or reviewing the separately obtained history  performing a medically appropriate examination and/or evaluation   counseling and educating the patient/family/caregiver  ordering medications, tests, or procedures  referring and communicating with other health care professionals (when not separately reported)   documenting clinical information in the electronic or other health records    Total time spent performing activities on date of service:  45  minutes          Return in about 2 months (around 09/14/2019) for right shoulder pain , Return sooner as needed.    Jules Husbands, DO

## 2019-07-14 NOTE — Progress Notes (Signed)
Have you seen any specialists/other providers since your last visit with [place]?    No    Arm preference verified?   Yes    The patient is due for AVD, shingle, high risk pneumonia

## 2019-07-15 ENCOUNTER — Encounter (INDEPENDENT_AMBULATORY_CARE_PROVIDER_SITE_OTHER): Payer: Self-pay

## 2019-07-19 ENCOUNTER — Telehealth (INDEPENDENT_AMBULATORY_CARE_PROVIDER_SITE_OTHER): Payer: Self-pay

## 2019-07-19 NOTE — Telephone Encounter (Signed)
Patient called back unable to send picture via my chart. picture was too big. Patient has an appt scheduled for 10:30 tomorrow. ok'd by [employer]

## 2019-07-19 NOTE — Telephone Encounter (Signed)
Patient c/o of having a reaction to the shingles vaccine. Patient received the vaccine on 07/14/19 to left deltoid. Patient stated her arm is red, swollen , itchy,streaks she said that look like she has busted blood vessel,also area is hot to touch. Patient stated the reaction started on Friday and thought her sx's would get better but did not. LPN asked if OTC was taken. She took tylenol 650 mg (2) on Saturday night/evening. She unable to take OTC pain relief due to stomach irritation.  Tracy [name] stated initially she was unable to move her arm but she is able to move it now.    Patient will take a picture and send through mychart. Once picture is uploaded will send to provider to review

## 2019-07-20 ENCOUNTER — Encounter (INDEPENDENT_AMBULATORY_CARE_PROVIDER_SITE_OTHER): Payer: Self-pay | Admitting: Family Medicine

## 2019-07-20 ENCOUNTER — Ambulatory Visit (INDEPENDENT_AMBULATORY_CARE_PROVIDER_SITE_OTHER): Payer: No Typology Code available for payment source | Admitting: Family Medicine

## 2019-07-20 VITALS — BP 129/79 | HR 52 | Temp 97.7°F | Resp 14 | Wt 139.6 lb

## 2019-07-20 DIAGNOSIS — L539 Erythematous condition, unspecified: Secondary | ICD-10-CM

## 2019-07-20 DIAGNOSIS — T50Z95A Adverse effect of other vaccines and biological substances, initial encounter: Secondary | ICD-10-CM

## 2019-07-20 NOTE — Progress Notes (Signed)
Have you seen any specialists/other providers since your last visit with us?    No    Arm preference verified?   Yes

## 2019-07-20 NOTE — Progress Notes (Signed)
Subjective:    Date: 07/20/2019 10:57 AM   Patient ID: Tracy [name] is a 54 y.o. female.    Chief Complaint   Patient presents with   . reaction to vaccine     shingrix,c/o dry skin, left arm redness noted to inner forearm, starting from outer part of arm near injection site, weakness      HPI   Pt received shingrix vaccination #1 on 07/14/2019, pt states hours after the vaccine she started to have swelling of the left arm.  Swelling has subsided, but still has some erythema lower in the arm.  Whole arm feels itchy, burning, sore.   Also c/o feeling fatigue and weak.   Has taken tylenol which helps. Denies fevers/chills.     No problems updated.    Patient Active Problem List   Diagnosis   . Migraine   . History of hypertension   . Abdominal cyst   . Hiatal hernia   . Hypokalemia   . S/P gastric bypass   . Fatty liver   . Postsurgical hypothyroidism   . Tubular adenoma of colon   . Allergic rhinitis   . Medication management   . Sleep difficulties   . Difficulty concentrating   . Forgetfulness   . Hx of migraine headaches   . Occipital headache   . Frontal headache   . Hx of concussion   . White matter abnormality on MRI of brain   . Hx of sleep apnea   . Irritable bowel syndrome with diarrhea   . Chronic diarrhea     Patient Care Team:  Jules Husbands, DO as PCP - General (Family Medicine)  Einar Grad, MD as Consulting Physician (Gastroenterology)  Zerita Boers, MD as Consulting Physician (Endocrinology, Diabetes and Metabolism)  Campbell Lerner, MD as Consulting Physician (Neurology)    Immunization History   Administered Date(s) Administered   . COVID-19 mRNA Vaccine Preservative Free 0.3 mL (PFIZER) 04/16/2019, 05/14/2019   . Pneumococcal 23 valent 07/14/2019   . Tdap 01/07/2011   . Zoster [facility]) Vaccine Recombinant 07/14/2019     Current Outpatient Medications   Medication Sig Dispense Refill   . albuterol sulfate HFA (PROVENTIL) 108 (90 Base) MCG/ACT inhaler Inhale 2 puffs into the lungs every 4  (four) hours as needed for Wheezing or Shortness of Breath (cough) 1 Inhaler 0   . Armour Thyroid 60 MG tablet TAKE 1 TABLET(60 MG) BY MOUTH DAILY 30 tablet 5   . Beclomethasone Dipropionate (Qnasl) 80 MCG/ACT Aero Soln 2 sprays by Nasal route daily 3 Inhaler 1   . Biotin 5 MG/ML Liquid Take by mouth daily        . budesonide-formoterol (SYMBICORT) 160-4.5 MCG/ACT inhaler Inhale 2 puffs into the lungs 2 (two) times daily 1 Inhaler 5   . butalbital-acetaminophen-caffeine (FIORICET, ESGIC) 50-325-40 MG per tablet Take 1 tablet by mouth every 4 (four) hours as needed for Headaches 10 tablet 0   . calcium-vitamin D (OSCAL-500 + D) 500-200 MG-UNIT per tablet Take 1 tablet by mouth daily     . cetirizine (ZyrTEC Allergy) 10 MG tablet Take 1 tablet (10 mg total) by mouth daily 90 tablet 0   . Cyanocobalamin 500 MCG/0.1ML Solution 1 spray via nares weekly 1 Bottle 3   . diclofenac Sodium (VOLTAREN) 1 % Gel topical gel Apply 4 g to affected area 4 times daily as needed.  Max: 16 g/joint/day.  Do not apply to broken, inflamed, or infected skin. 100 g 1   .  montelukast (SINGULAIR) 10 MG tablet TAKE 1 TABLET(10 MG) BY MOUTH EVERY NIGHT 90 tablet 0   . Multiple Vitamins-Minerals (MULTIVITAMIN WITH MINERALS) tablet Take 1 tablet by mouth daily     . Spacer/Aero-Holding Chambers (AEROCHAMBER Z-STAT PLUS/MEDIUM) inhaler Use as instructed 1 each 0   . vitamin D (CHOLECALCIFEROL) 25 MCG (1000 UT) tablet Take 1,000 Units by mouth daily     . calcium carbonate 1500 (600 Ca) MG Tab tablet Take 600 mg by mouth       No current facility-administered medications for this visit.     Allergies   Allergen Reactions   . Bee Pollen Swelling     "Bee Stings".Marland KitchenMarland KitchenMarland KitchenStuffy nose; "throat swelling"     . Vicodin [Hydrocodone-Acetaminophen] Nausea And Vomiting       Past Medical History:   Diagnosis Date   . Anemia    . Arthritis    . Carotid stenosis    . Diarrhea 12/2016    Since I had my dual switch surgery   . Fatty liver 2017   . GERD  (gastroesophageal reflux disease)    . Hepatic cirrhosis    . Hiatal hernia    . Hyperthyroidism 2013    Thyroid removed   . Sleep apnea     not using CPAP machine   . Vitamin D deficiency      Past Surgical History:   Procedure Laterality Date   . CHOLECYSTECTOMY  12/2016   . COLONOSCOPY, BIOPSY N/A 01/27/2018    Procedure: COLONOSCOPY, BIOPSY;  Surgeon: Shanon Brow, MD;  Location: [facility];  Service: Gastroenterology;  Laterality: N/A;   . 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 EGD, BIOPSY N/A 01/27/2018    Procedure: EGD, BIOPSY;  Surgeon: Shanon Brow, MD;  Location: [facility];  Service: Gastroenterology;  Laterality: N/A;   . GASTRECTOMY  09/2013   . HYSTERECTOMY  2015    Partial.   . LAPAROSCOPIC REVISION SLEEVE GASTRECTOMY TO GASTRIC BYPASS     . LAPAROSCOPIC, GASTRECTOMY SLEEVE     . SALPINGO OOPHERECTOMY Bilateral    . THYROIDECTOMY  2013       Family History   Problem Relation Age of Onset   . Cancer Mother         hx ovarian   . Hypertension Mother    . Inflammatory bowel disease Mother    . Heart failure Mother    . Angina Mother    . Migraines Mother    . Asthma Father    . Diabetes Father    . Hyperlipidemia Father    . Hypertension Father    . Cancer Father         Currently has cancer   . Crohn's disease Father    . Asthma Sister    . Diabetes Sister    . Hyperlipidemia Sister    . Hypertension Sister    . Early death Sister    . Cancer Brother         hx of stomach   . Hyperlipidemia Brother    . Hypertension Brother    . Migraines Brother    . Learning disabilities Brother         Deaf   . Breast cancer Paternal Aunt         died of    . Breast cancer Other         maternal cousin-- survior   . Obesity Son  Social History     Socioeconomic History   . Marital status: Significant Other     Spouse name: Not on file   . Number of children: Not on file   . Years of education: Not on file   . Highest education level: Not on file   Occupational History   . Not on file   Tobacco  Use   . Smoking status: Never Smoker   . Smokeless tobacco: Never Used   . Tobacco comment: None   Vaping Use   . Vaping Use: Never used   Substance and Sexual Activity   . Alcohol use: No   . Drug use: No   . Sexual activity: Not Currently     Partners: Male     Birth control/protection: Post-menopausal, Rhythm   Other Topics Concern   . Dietary supplements / vitamins Yes     Comment: Biotin B12 multi vitamins D3   . Anesthesia problems No   . Blood thinners No   . Pregnant No   . Future Children No   . Number of Pregnancies? No   . Number of children Yes     Comment: 1   . Miscarriages / Abortions? No   . Eats large amounts No   . Excessive Sweets No   . Skips meals No   . Eats excessive starches No   . Snacks or grazes No   . Emotional eater No   . Eats fried food No   . Eats fast food No   . Diet Center Yes   . Doylene Bode No   . [place] Weight Loss No   . Nutri-System No   . Opti-Fast / Medi-Fast Yes   . Overeaters Anonymous No   . [facility] Yes   . TOPS No   . Weight Watchers Yes   . Atkins Yes   . Binging / Purging No   . Calorie Counting Yes   . Fasting Yes   . High Protein Yes   . Low Carb Yes   . Low Fat No   . [facility] Diet No   . Slim Fast Yes   . [facility] No   . Stationary cycle or treadmill Yes   . Gym/fitness Classes Yes   . Home exercise/video Yes   . Swimming Yes   . Weight training Yes   . Walking or running Yes   . Hospitalization No   . Hypnosis No   . Physical therapy Yes   . Psychological therapy Yes   . Residential program No   . Acutrim No   . Byetta No   . Contrave No   . Dexatrim Yes   . Diethylpropion No   . Fastin No   . Fen - Phen Yes   . Ionamin / Adipex No   . Phentermine Yes   . Qsymia No   . Prozac Yes   . Saxenda No   . Topamax No   . Wellbutrin No   . Xenical (Orlistat, Alli) No   . Other Med No   . No impairment Yes   . Walks with cane/crutch Yes   . Requires a wheelchair No   . Bedridden No   . Are you currently being treated for depression? No   . Do you  snore? No   . Are you receiving any medical or psychological services? No   . Do you ever wake up at night gasping for breath?  No   . Do you have or have you been treated for an eating disorder? No   . Anyone ever told you that you stop breathing while asleep? No   . Do you exercise regularly? Yes   . Have you or family member ever have trouble with anesthesia? Yes   Social History Narrative   . Not on file     Social Determinants of Health     Financial Resource Strain:    . Difficulty of Paying Living Expenses:    Food Insecurity:    . Worried About Programme researcher, broadcasting/film/video in the Last Year:    . Barista in the Last Year:    Transportation Needs:    . Freight forwarder (Medical):    Marland Kitchen Lack of Transportation (Non-Medical):    Physical Activity:    . Days of Exercise per Week:    . Minutes of Exercise per Session:    Stress:    . Feeling of Stress :    Social Connections:    . Frequency of Communication with Friends and Family:    . Frequency of Social Gatherings with Friends and Family:    . Attends Religious Services:    . Active Member of Clubs or Organizations:    . Attends Banker Meetings:    Marland Kitchen Marital Status:    Intimate Partner Violence:    . Fear of Current or Ex-Partner:    . Emotionally Abused:    Marland Kitchen Physically Abused:    . Sexually Abused:      The following portions of the patient's history were reviewed and updated as appropriate: allergies, current medications, past family history, past medical history, past social history, past surgical history and problem list.    Review of Systems   Constitutional: Negative for chills and fever.   HENT: Negative.    Respiratory: Negative for cough and shortness of breath.    Skin: Positive for color change. Negative for rash.        Objective:   BP 129/79 (BP Site: Right arm, Patient Position: Sitting, Cuff Size: Medium)   Pulse (!) 52   Temp 97.7 F (36.5 C) (Temporal)   Resp 14   Wt 63.3 kg (139 lb 9.6 oz)   LMP  (LMP Unknown)   BMI  25.13 kg/m   Wt Readings from Last 3 Encounters:   07/20/19 63.3 kg (139 lb 9.6 oz)   07/14/19 62.6 kg (138 lb)   07/05/19 62.3 kg (137 lb 5.6 oz)     Physical Exam  Musculoskeletal:      Left shoulder: No swelling, deformity or effusion. Normal range of motion.      Left elbow: No swelling. Normal range of motion. No tenderness.   Skin:            Comments: Mild erythema left arm in area depicted  No increased warmth as compared to contralateral side  In induration/fluctuances  Deltoid/area of vaccination is normal - no erythema or swelling    Left shoulder ROM is symmetric to contralateral side and does not induce pain            Assessment/Plan:       1. Adverse effect of vaccine, initial encounter  consistent with local skin reaction to shingrix vaccination.   Expected self limited course  Does not have intrinsic shoulder joint signs or symptoms  No sign of infection  Continue symptomatic management   Use cool  compress for 20 min TID and PRN  Tylenol PRN  Encouraged stretching and ROM exercise of left UE  Patient advised to call or return to clinic if these symptoms worsen or fail to improve as anticipated.    Return if symptoms worsen or fail to improve.    Jules Husbands, DO

## 2019-07-22 ENCOUNTER — Encounter (INDEPENDENT_AMBULATORY_CARE_PROVIDER_SITE_OTHER): Payer: Self-pay | Admitting: Internal Medicine

## 2019-07-27 ENCOUNTER — Telehealth (INDEPENDENT_AMBULATORY_CARE_PROVIDER_SITE_OTHER): Payer: Self-pay | Admitting: Internal Medicine

## 2019-07-27 NOTE — Telephone Encounter (Signed)
Call patient but could not reach her.  Left voicemail to call back.    Would like to follow-up chronic diarrhea.  She had a recent ER visit more than diarrhea and dehydration requiring IV fluids.  The symptoms are stable or improved with recommended 2 weeks rifaximin therapy, would continue symptomatic treatment using Imodium 2 to 4 mg every 6 hours as needed for diarrhea and follow-up with the GI office as previously scheduled.    Einar Grad, MD  [facility]

## 2019-08-02 ENCOUNTER — Other Ambulatory Visit (INDEPENDENT_AMBULATORY_CARE_PROVIDER_SITE_OTHER): Payer: Self-pay | Admitting: Family Medicine

## 2019-08-07 NOTE — Telephone Encounter (Signed)
This encounter was created in error - please disregard.    Items noted as "reviewed" are for administrative purposes only and are not guaranteed by the provider to be accurate on this date.

## 2019-08-14 ENCOUNTER — Encounter (INDEPENDENT_AMBULATORY_CARE_PROVIDER_SITE_OTHER): Payer: Self-pay

## 2019-08-15 ENCOUNTER — Encounter (INDEPENDENT_AMBULATORY_CARE_PROVIDER_SITE_OTHER): Payer: Self-pay

## 2019-09-01 ENCOUNTER — Ambulatory Visit (INDEPENDENT_AMBULATORY_CARE_PROVIDER_SITE_OTHER): Payer: No Typology Code available for payment source | Admitting: Internal Medicine

## 2019-09-01 ENCOUNTER — Encounter (INDEPENDENT_AMBULATORY_CARE_PROVIDER_SITE_OTHER): Payer: Self-pay | Admitting: Internal Medicine

## 2019-09-01 VITALS — BP 176/101 | HR 48 | Ht 62.5 in | Wt 140.8 lb

## 2019-09-01 DIAGNOSIS — Z9049 Acquired absence of other specified parts of digestive tract: Secondary | ICD-10-CM

## 2019-09-01 DIAGNOSIS — Z903 Acquired absence of stomach [part of]: Secondary | ICD-10-CM

## 2019-09-01 DIAGNOSIS — E860 Dehydration: Secondary | ICD-10-CM

## 2019-09-01 DIAGNOSIS — R197 Diarrhea, unspecified: Secondary | ICD-10-CM

## 2019-09-01 NOTE — Progress Notes (Signed)
[address]  [place], [place].  [address]  Phone:[phone]  [phone]      FOLLOW UP VISIT    Date of Visit: 09/01/2019 4:29 PM  Patient ID: Tracy [name] is a 54 y.o. female.  Referring Physician: @REFPROVFL @     Telehealth: Verbal consent has been obtained from the patient to conduct a video and telephone visit to minimize exposure to COVID-19: yes     Reason for Consultation:   Diarrhea    History:   Tracy [name] is a 54 y.o. female who presented for follow-up of chronic diarrhea.    In the interim since her last GI visit:  -07/05/2019 she was seen in the ER for dehydration related to diarrhea and insufficient oral intake.  Lab tests were suggestive of mild dehydration with no AKI. She received IV fluids and was advised to pursue outpatient IV fluids.  -She followed up with PCP  Dr. Nechama Guard in July 2021.  I had a discussion with Dr. Nechama Guard, recommended Imodium 2 to 4 mg every 6 hours as needed for diarrhea and follow-up with the GI office as previously scheduled.  -She completed a 2 weeks course of rifaximin for IBS-D.  However did not notice significant improvement of her GI symptoms.    Today, she reported her diarrhea has been persistent with on average 4 to 8 loose/watery stools per day.  She felt that she often became dehydrated, however has not required to go to the ER for IV hydration.  She felt she could not tolerate sufficient oral intake for hydration because of her previous bariatric surgeries.  She spoke to the nurse of her previous pelvic surgery clinic and was advised to consider periodic IV hydration to prevent dehydration.    To review, she has had chronic diarrhea for more than 2 years.  She has on average 4-8 bowel months per day with loose, greasy stools, worse after eating, associate with lower abdominal cramps.  She has been following up with a [facility] since 2019.     Previous lab tests including CBC, blood chemistries was unremarkable.  CT  abdomen and pelvis in August 2019 showed findings concerning for ileitis. EGD and colonoscopy in January 2020 was unremarkable.  However the terminal ileum was not assessed.      She was advised to avoid fatty food and the use Imodium as needed for diarrhea, however the diarrhea has been persistent.  She denied seeing blood in the stool, constipation, nausea, vomiting, unintentional weight loss.    Work up and therapy trials from 01/2019 to 08/2019:  - C-reactive protein,  Celiac serology, fecal calprotectin for IBD for diarrhea, fecal Giardia Antigen, stool pancreatic elastase were negative.  - Colonoscopy 01/2019 showed a nodularity in the terminal ileum however biopsies were normal.    - Trial therapy using cholestyramine for bile acid diarrhea 4 months was completed.  She initially responded to bile acid binder, did not have long-lasting improvement in diarrhea.    -She completed a 2 weeks course of rifaximin for IBS-D.  However did not notice significant improvement of her GI symptoms.    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  She had a history of cholecystectomy.    Problem List:     Patient Active Problem List   Diagnosis   . Migraine   . History of hypertension   . Abdominal cyst   . Hiatal hernia   . Hypokalemia   .  S/P gastric bypass   . Fatty liver   . Postsurgical hypothyroidism   . Tubular adenoma of colon   . Allergic rhinitis   . Medication management   . Sleep difficulties   . Difficulty concentrating   . Forgetfulness   . Hx of migraine headaches   . Occipital headache   . Frontal headache   . Hx of concussion   . White matter abnormality on MRI of brain   . Hx of sleep apnea   . Irritable bowel syndrome with diarrhea   . Chronic diarrhea       Past Medical History:     Past Medical History:   Diagnosis Date   . Anemia    . Arthritis    . Carotid stenosis    . Diarrhea 12/2016    Since I had my dual switch surgery   . Fatty liver 2017   . GERD  (gastroesophageal reflux disease)    . Hepatic cirrhosis    . Hiatal hernia    . Hyperthyroidism 2013    Thyroid removed   . Sleep apnea     not using CPAP machine   . Vitamin D deficiency        Past Surgical History:     Past Surgical History:   Procedure Laterality Date   . CHOLECYSTECTOMY  12/2016   . COLONOSCOPY, BIOPSY N/A 01/27/2018    Procedure: COLONOSCOPY, BIOPSY;  Surgeon: Shanon Brow, MD;  Location: [facility];  Service: Gastroenterology;  Laterality: N/A;   . 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 EGD, BIOPSY N/A 01/27/2018    Procedure: EGD, BIOPSY;  Surgeon: Shanon Brow, MD;  Location: [facility];  Service: Gastroenterology;  Laterality: N/A;   . GASTRECTOMY  09/2013   . HYSTERECTOMY  2015    Partial.   . LAPAROSCOPIC REVISION SLEEVE GASTRECTOMY TO GASTRIC BYPASS     . LAPAROSCOPIC, GASTRECTOMY SLEEVE     . SALPINGO OOPHERECTOMY Bilateral    . THYROIDECTOMY  2013       Current Medications:   She  has a current medication list which includes the following prescription(s): albuterol sulfate hfa, armour thyroid, qnasl, biotin, budesonide-formoterol, butalbital-acetaminophen-caffeine, calcium carbonate, calcium-vitamin d, cetirizine, cyanocobalamin, diclofenac sodium, montelukast, multivitamin with minerals, aerochamber z-stat plus/medium, and vitamin d.     Outpatient Medications Marked as Taking for the 09/01/19 encounter (Office Visit) with Einar Grad, MD   Medication Sig Dispense Refill   . albuterol sulfate HFA (PROVENTIL) 108 (90 Base) MCG/ACT inhaler Inhale 2 puffs into the lungs every 4 (four) hours as needed for Wheezing or Shortness of Breath (cough) 1 Inhaler 0   . Armour Thyroid 60 MG tablet TAKE 1 TABLET(60 MG) BY MOUTH DAILY 30 tablet 5   . Beclomethasone Dipropionate (Qnasl) 80 MCG/ACT Aero Soln 2 sprays by Nasal route daily 3 Inhaler 1   . Biotin 5 MG/ML Liquid Take by mouth daily        . budesonide-formoterol (SYMBICORT) 160-4.5 MCG/ACT inhaler Inhale 2 puffs into the lungs  2 (two) times daily 1 Inhaler 5   . butalbital-acetaminophen-caffeine (FIORICET, ESGIC) 50-325-40 MG per tablet Take 1 tablet by mouth every 4 (four) hours as needed for Headaches 10 tablet 0   . calcium carbonate 1500 (600 Ca) MG Tab tablet Take 600 mg by mouth     . calcium-vitamin D (OSCAL-500 + D) 500-200 MG-UNIT per tablet Take 1 tablet by mouth daily     . cetirizine (ZyrTEC  Allergy) 10 MG tablet Take 1 tablet (10 mg total) by mouth daily 90 tablet 0   . Cyanocobalamin 500 MCG/0.1ML Solution 1 spray via nares weekly 1 Bottle 3   . diclofenac Sodium (VOLTAREN) 1 % Gel topical gel Apply 4 g to affected area 4 times daily as needed.  Max: 16 g/joint/day.  Do not apply to broken, inflamed, or infected skin. 100 g 1   . montelukast (SINGULAIR) 10 MG tablet TAKE 1 TABLET(10 MG) BY MOUTH EVERY NIGHT 90 tablet 0   . Multiple Vitamins-Minerals (MULTIVITAMIN WITH MINERALS) tablet Take 1 tablet by mouth daily     . Spacer/Aero-Holding Chambers (AEROCHAMBER Z-STAT PLUS/MEDIUM) inhaler Use as instructed 1 each 0   . vitamin D (CHOLECALCIFEROL) 25 MCG (1000 UT) tablet Take 1,000 Units by mouth daily         Allergies:     Allergies   Allergen Reactions   . Bee Pollen Swelling     "Bee Stings".Marland KitchenMarland KitchenMarland KitchenStuffy nose; "throat swelling"     . Vicodin [Hydrocodone-Acetaminophen] Nausea And Vomiting       Family History:     Family History   Problem Relation Age of Onset   . Cancer Mother         hx ovarian   . Hypertension Mother    . Inflammatory bowel disease Mother    . Heart failure Mother    . Angina Mother    . Migraines Mother    . Asthma Father    . Diabetes Father    . Hyperlipidemia Father    . Hypertension Father    . Cancer Father         Currently has cancer   . Crohn's disease Father    . Asthma Sister    . Diabetes Sister    . Hyperlipidemia Sister    . Hypertension Sister    . Early death Sister    . Cancer Brother         hx of stomach   . Hyperlipidemia Brother    . Hypertension Brother    . Migraines Brother    .  Learning disabilities Brother         Deaf   . Breast cancer Paternal Aunt         died of    . Breast cancer Other         maternal cousin-- survior   . Obesity Son        Social History:     Social History     Tobacco Use   . Smoking status: Never Smoker   . Smokeless tobacco: Never Used   . Tobacco comment: None   Vaping Use   . Vaping Use: Never used   Substance Use Topics   . Alcohol use: No   . Drug use: No       Review of Systems:   Constitutional: Denies fever, chills or weight loss.  Eyes: Denies Blurred vision, eye discharge or eye pain.   ENMT: Denies changes in hearing, nasal discharge, oral lesions or sore throat.  Respiratory: Denies wheezing or cough.  Cardiovascular: Denies chest pain or palpitations.  Gastrointestinal: See HPI.  Genitourinary: Denies gross hematuria or dark urine.  Musculoskeletal: Denies back pain or joint pain.  Neurologic: Denies slurred speech, hemiplegia or focal deficit.  Psychiatric: Denies depression or anxiety.   Hematologic: Denies easy bruising.  Integumentary:  Denies skin rashes or Jaundice.    Vital Signs:   BP Marland Kitchen)  176/101 (BP Site: Right arm)   Pulse (!) 48   Ht 1.588 m (5' 2.5")   Wt 63.9 kg (140 lb 12.8 oz)   LMP  (LMP Unknown)   BMI 25.34 kg/m      Weight Monitoring 07/14/2019 07/20/2019 09/01/2019   Height - - 158.8 cm   Height Method - - -   Weight 62.596 kg 63.322 kg 63.866 kg   Weight Method - - -   BMI (calculated) - - 25.4 kg/m2          Physical Exam:   General appearance - Well developed and well nourished, no acute distress.  No obvious abnormalities on video exam.    Labs Reviewed:     Recent Labs     07/05/19  0847 04/07/19  1005 01/20/18  0823   WBC 2.94* 2.88* 3.17   Hgb 10.9* 10.7* 12.9   Hematocrit 33.5* 34.3* 41.0   Platelets 232 216 194       Recent Labs     08/15/17  1237 08/14/17  0839   PT 14.2 14.3   PT INR 1.1 1.1       Recent Labs     07/05/19  0912 07/05/19  0847 04/07/19  1005 01/11/19  1637 03/02/18  1702 01/20/18  0823 08/25/17  1423  08/16/17  0402 08/15/17  0318 08/14/17  0858   Sodium  --  144 143  --   --  142   < >  --    < > 145   Potassium  --  4.5 3.8  --   --  4.1   < >  --    < > 3.1*   Chloride  --  112* 108  --   --  107   < >  --    < > 110   CO2  --  20* 24  --   --  27   < >  --    < > 27   BUN  --  16 14.0  --   --  14.0   < >  --    < > 11.0   Creatinine  --  0.9 0.9  --   --  1.0   < >  --    < > 1.0   Calcium  --  9.3 9.1  --   --  9.5   < >  --    < > 8.4*   Albumin  --  3.7 3.8  --   --  4.1   < >  --    < > 3.6   Protein, Total  --  6.4 6.3  --   --  6.8   < >  --    < > 5.8*   Bilirubin, Total  --  0.8 0.9  --   --  1.5*   < >  --    < > 1.0   Alkaline Phosphatase  --  123* 114*  --   --  115*   < >  --    < > 102   ALT  --  104* 26  --   --  31   < >  --    < > 37   AST (SGOT)  --  173* 30  --   --  45*   < >  --    < > 47*   Glucose  --  89 83  --   --  86   < >  --    < > 89   Lipase  --   --   --   --   --   --   --   --   --  <4*   TSH 4.79  --   --  2.73   < > 7.53*   < >  --    < >  --    TSH, Abn Reflex to Free T4, Serum  --   --  4.56  --   --   --   --   --   --   --    Ferritin  --   --  11.25  --   --   --   --  29.26  --   --     < > = values in this interval not displayed.       Rads:   No results found.    GI Procedures:       Assessment and Plan:     1. Diarrhea, unspecified type    2. Dehydration    3. History of sleeve gastrectomy    4. History of cholecystectomy     No orders of the defined types were placed in this encounter.     There are no discontinued medications.    Diagnostic data reviewed: Colonoscopy January 2020 was unremarkable except for 2 small polyps which were resected.  The terminal ileum was not examined.  Repeat colonoscopy in 5 years was recommended.  EGD in January 2020 showed post sleeve gastrectomy change, erythematous mucosa in the gastric antrum and was otherwise unremarkable.  CT abdomen and pelvis in August 2019 showed mild thickening of the distal small bowel near the terminal  ileum with stranding in the adjacent mesentery, consistent with ileitis.     # Chronic diarrhea.  Associate with mild abdominal cramps. Differential diagnosis for etiology include IBS-D, Small bowel malabsorption associated with sleeve gastrectomy and gastric bypass surgery. Previous work up ruled out celiac disease, bile acid diarrhea secondary to cholecystectomy, microscopic colitis, Crohn's disease involving the small bowel, X requiring pancreatic insufficiency, bile acids diarrhea, celiac disease, giardiasis.    # Dehydration.  Intermittent dehydration associated with chronic diarrhea and decreased oral intake due to intolerance likely related to her previous bariatric surgery.    Plan:  -Continue Imodium 2 to 4 mg every 6 hours as needed for diarrhea/loose stools.  -Encourage patient to increase oral fluid intake.  -If her oral intake is insufficient for hydration, would advise her to discuss with bariatric surgery to further evaluate for consideration of revision of her previous bariatric surgery to allow sufficient oral intake for hydration.  -I do not recommend periodical IV fluids for hydration.  Advised her to discuss with PCP for a referral to a tertiary care center such as [facility] for a second opinion.    Return if symptoms worsen or fail to improve.    It is a pleasure to participate in the care of Tracy [name]. If you have any questions or concerns, please feel free to contact us at [phone].    Einar Grad, MD

## 2019-09-01 NOTE — Patient Instructions (Signed)
Patient Instructions:    -Continue Imodium 2 to 4 mg every 6 hours as needed for diarrhea/loose stools.  -Encourage patient to increase oral fluid intake.  -If her oral intake is insufficient for hydration, would advise her to discuss with bariatric surgery to further evaluate for consideration of revision of her previous bariatric surgery to allow sufficient oral intake for hydration.  -I do not recommend periodical IV fluids for hydration.  Advised her to discuss with PCP for a referral to a tertiary care center such as [facility] for a second opinion.    Diagnostic tests and referrals placed today:    No orders of the defined types were placed in this encounter.      Start taking the following new medications prescribed today:  No orders of the defined types were placed in this encounter.       Stop taking the following medications:  There are no discontinued medications.    ---------------

## 2019-09-14 ENCOUNTER — Encounter (INDEPENDENT_AMBULATORY_CARE_PROVIDER_SITE_OTHER): Payer: Self-pay

## 2019-09-15 ENCOUNTER — Encounter (INDEPENDENT_AMBULATORY_CARE_PROVIDER_SITE_OTHER): Payer: Self-pay

## 2019-10-05 ENCOUNTER — Ambulatory Visit (INDEPENDENT_AMBULATORY_CARE_PROVIDER_SITE_OTHER): Payer: No Typology Code available for payment source

## 2019-10-05 ENCOUNTER — Telehealth (INDEPENDENT_AMBULATORY_CARE_PROVIDER_SITE_OTHER): Payer: No Typology Code available for payment source | Admitting: Family Medicine

## 2019-10-05 ENCOUNTER — Encounter (INDEPENDENT_AMBULATORY_CARE_PROVIDER_SITE_OTHER): Payer: Self-pay | Admitting: Family Medicine

## 2019-10-05 VITALS — Temp 98.2°F

## 2019-10-05 DIAGNOSIS — J029 Acute pharyngitis, unspecified: Secondary | ICD-10-CM

## 2019-10-05 DIAGNOSIS — J069 Acute upper respiratory infection, unspecified: Secondary | ICD-10-CM

## 2019-10-05 LAB — IHS AMB POCT SOFIA (TM) SARS CORONAVIRUS ANTIGEN FIA: Sofia SARS COV2 Antigen POCT: NEGATIVE

## 2019-10-05 NOTE — Patient Instructions (Signed)
Thank you for choosing [facility]. During your visit you received a test for COVID-19 and the test performed takes at least 3-5 days to result. The goal for results is 3-5 days, however with the current increase in testing volumes, tests can take up to 10 days for results to be ready. We appreciate your patience.    What should I do while I am waiting for my test results?  ?Take good care of yourself and be mindful of the safety of those around you:  ?Rest and stay well hydrated  .?Use acetaminophen (Tylenol) to help manage fever  .?Wash your hands often  .?Stay at home except to get medical care  .?Call ahead before seeing your doctor, any other medical provider, or seeking medical care at any healthcare facility  .?Isolate yourself as much as possible  .?Clean all "high touch" surfaces every day  .?Most people with COVID-19 infection will get better, however, a small number will develop worsening infection which may require more intensive treatment. Please contact your doctor or present to the nearest Emergency Department if you develop the warning signs of more severe infection such as:   ?A fever over 102 not controlled by acetaminophen (Tylenol)  ?Shortness of breath or chest pain  ?Worsening cough  ?Increasing weakness  ?Inability to tolerate oral fluids    ?Some general things to think about:-If you have a medical emergency and need to call 911, notify the person answering the phone that you have, or are being evaluated for COVID-19. If possible, put on a facemask before the ambulance arrives.-Persons who are placed under active monitoring or facilitated self-monitoring should follow instructions provided by their local health department or occupational health professionals, as appropriate..    When will I know the results of my COVID test?  After a minimum of 3-5 days has passed, there are multiple ways in which you can be informed about the results of your COVID 19 test results.  Please  ensure we have your most up to date contact information on file before you leave your appointment. .MyChart -you may be notified of results through your MyChart account. .The easiest way to find all test results done at an [facility] facility is through your MyChart account, [facility] online patient portal. This is also an easy way to connect with your [facility] primary care team for continued care, if this applies to you. If you do not already have MyChart activated, you will be given an activation code at the time of testing. Please note that for children age 12-17 MyChart is not available. .You may receive a call from your Primary Care Physician or the physician who ordered the test with the results. You may also receive a call from .  If none of the options above has helped you obtain your results, or you have additional questions, please contact the COVID 19 Notifications Call Center for further instructions at [phone], open 6 days a week, Monday-Saturday from 8:30am-5:00pm.Again, the goal for results is 3-5 days, however with the current increase in testing volumes, tests can take up to 10 days for results to be ready. We appreciate your patience.  .If you need help activating your MyChart account, please let us know.   When can I stop isolation? Until you receive your results,you should remain home and avoid contact with others (self-isolate).The decision to stop isolation precautions should be made on a case-by-case basis, in consultation with healthcare providers and state and local health departments..Here is some general   advice:  • If your test result is negative, you should stay home until you do not have a fever (without the use of fever reducing medication) or any respiratory symptoms for at least 3 days.   • If your test result is positive, you should stay home for at least 10 days from the day your symptoms started. If you are still having symptoms at the end of 10 days, continue in-home isolation  until 3 days after your symptoms stop. Talk to your healthcare provider to determine what follow-up is needed.You can also schedule telemedicine visits with your primary care physician to discuss ongoing symptoms or new concerns that may arise.  For further information on what you and others in your household should do please visit the Center for Disease Control (CDC) website @ https://www.cdc.gov/coronavirus/2019-ncov/about/steps-when-sick.html   If you are ill and need to be seen by a healthcare provider, and your primary physician is not available, the [facility] are open for walk in visits..[facility] -open M -F 8 am to 8 pm at the following locations:   [facility]: [address], [place], [place] [address] Open Daily 8am -8pm including weekends at the following location:  [facility]: [address], [place],  [address]  In addition, all [facility] can care for patients with all conditions, both COVID and non-COVID. Do not hesitate to seek care should you have a healthcare emergency.

## 2019-10-05 NOTE — Progress Notes (Signed)
Patient presented at the office for Covid nose swab test administration. Both nostril was swab, no reaction was noted and patient left in good condition.

## 2019-10-05 NOTE — Progress Notes (Signed)
Have you seen any specialists/other providers since your last visit with [place]?    No    Arm preference verified?   No N/A    The patient is due for Advance Directive, Infuenza Vaccine, shingrix

## 2019-10-05 NOTE — Progress Notes (Signed)
Subjective:    Date: 10/05/2019 11:49 AM   Patient ID: Tracy [name] is a 54 y.o. female.    This visit is being conducted via video and telephone.    Verbal consent has been obtained from the patient to conduct a video and telephone visit to minimize exposure to COVID-19: yes    Chief Complaint   Patient presents with   . covid test     HPI  Pt states she was exposed to someone with covid  Son tested positive, however states had no contact with him  However states her niece tested positive and she was around her niece last Saturday  Pt reports she started with a sore throat on Sunday (3 days ago), then having headache off and on        Fever or chills - no      Cough - slight cough, "here and there"      Shortness of breath or difficulty breathing - denies      Fatigue - felt really tired on Monday, better next day      Muscle or body aches - denies      Headache - yes      New loss of taste or smell - denies      Sore throat - yes      Congestion, nasal - yes       Runny nose - yes      Nausea or vomiting - nauseated on Sunday, no vomiting.      Diarrhea - chronic diarrhea, denies any change.     Has taken mucinex, elderberry, vitamins.     No problems updated.    Patient Active Problem List   Diagnosis   . Migraine   . History of hypertension   . Abdominal cyst   . Hiatal hernia   . Hypokalemia   . S/P gastric bypass   . Fatty liver   . Postsurgical hypothyroidism   . Tubular adenoma of colon   . Allergic rhinitis   . Medication management   . Sleep difficulties   . Difficulty concentrating   . Forgetfulness   . Hx of migraine headaches   . Occipital headache   . Frontal headache   . Hx of concussion   . White matter abnormality on MRI of brain   . Hx of sleep apnea   . Irritable bowel syndrome with diarrhea   . Chronic diarrhea     Patient Care Team:  Jules Husbands, DO as PCP - General (Family Medicine)  Einar Grad, MD as Consulting Physician (Gastroenterology)  Zerita Boers, MD as Consulting  Physician (Endocrinology, Diabetes and Metabolism)  Campbell Lerner, MD as Consulting Physician (Neurology)    Immunization History   Administered Date(s) Administered   . COVID-19 mRNA Vaccine Preservative Free 0.3 mL (PFIZER) 04/16/2019, 05/14/2019   . Pneumococcal 23 valent 07/14/2019   . Tdap 01/07/2011   . Zoster [facility]) Vaccine Recombinant 07/14/2019     Current Outpatient Medications   Medication Sig Dispense Refill   . albuterol sulfate HFA (PROVENTIL) 108 (90 Base) MCG/ACT inhaler Inhale 2 puffs into the lungs every 4 (four) hours as needed for Wheezing or Shortness of Breath (cough) 1 Inhaler 0   . Armour Thyroid 60 MG tablet TAKE 1 TABLET(60 MG) BY MOUTH DAILY 30 tablet 5   . Beclomethasone Dipropionate (Qnasl) 80 MCG/ACT Aero Soln 2 sprays by Nasal route daily 3 Inhaler 1   . Biotin 5 MG/ML  Liquid Take by mouth daily        . budesonide-formoterol (SYMBICORT) 160-4.5 MCG/ACT inhaler Inhale 2 puffs into the lungs 2 (two) times daily 1 Inhaler 5   . butalbital-acetaminophen-caffeine (FIORICET, ESGIC) 50-325-40 MG per tablet Take 1 tablet by mouth every 4 (four) hours as needed for Headaches 10 tablet 0   . calcium carbonate 1500 (600 Ca) MG Tab tablet Take 600 mg by mouth     . calcium-vitamin D (OSCAL-500 + D) 500-200 MG-UNIT per tablet Take 1 tablet by mouth daily     . cetirizine (ZyrTEC Allergy) 10 MG tablet Take 1 tablet (10 mg total) by mouth daily 90 tablet 0   . Cyanocobalamin 500 MCG/0.1ML Solution 1 spray via nares weekly 1 Bottle 3   . diclofenac Sodium (VOLTAREN) 1 % Gel topical gel Apply 4 g to affected area 4 times daily as needed.  Max: 16 g/joint/day.  Do not apply to broken, inflamed, or infected skin. 100 g 1   . montelukast (SINGULAIR) 10 MG tablet TAKE 1 TABLET(10 MG) BY MOUTH EVERY NIGHT 90 tablet 0   . Multiple Vitamins-Minerals (MULTIVITAMIN WITH MINERALS) tablet Take 1 tablet by mouth daily     . Spacer/Aero-Holding Chambers (AEROCHAMBER Z-STAT PLUS/MEDIUM) inhaler Use as  instructed 1 each 0   . vitamin D (CHOLECALCIFEROL) 25 MCG (1000 UT) tablet Take 1,000 Units by mouth daily       No current facility-administered medications for this visit.     Allergies   Allergen Reactions   . Bee Pollen Swelling     "Bee Stings".Marland KitchenMarland KitchenMarland KitchenStuffy nose; "throat swelling"     . Vicodin [Hydrocodone-Acetaminophen] Nausea And Vomiting       Past Medical History:   Diagnosis Date   . Anemia    . Arthritis    . Carotid stenosis    . Diarrhea 12/2016    Since I had my dual switch surgery   . Fatty liver 2017   . GERD (gastroesophageal reflux disease)    . Hepatic cirrhosis    . Hiatal hernia    . Hyperthyroidism 2013    Thyroid removed   . Sleep apnea     not using CPAP machine   . Vitamin D deficiency      Past Surgical History:   Procedure Laterality Date   . CHOLECYSTECTOMY  12/2016   . COLONOSCOPY, BIOPSY N/A 01/27/2018    Procedure: COLONOSCOPY, BIOPSY;  Surgeon: Shanon Brow, MD;  Location: [facility];  Service: Gastroenterology;  Laterality: N/A;   . 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 EGD, BIOPSY N/A 01/27/2018    Procedure: EGD, BIOPSY;  Surgeon: Shanon Brow, MD;  Location: [facility];  Service: Gastroenterology;  Laterality: N/A;   . GASTRECTOMY  09/2013   . HYSTERECTOMY  2015    Partial.   . LAPAROSCOPIC REVISION SLEEVE GASTRECTOMY TO GASTRIC BYPASS     . LAPAROSCOPIC, GASTRECTOMY SLEEVE     . SALPINGO OOPHERECTOMY Bilateral    . THYROIDECTOMY  2013       Family History   Problem Relation Age of Onset   . Cancer Mother         hx ovarian   . Hypertension Mother    . Inflammatory bowel disease Mother    . Heart failure Mother    . Angina Mother    . Migraines Mother    . Asthma Father    . Diabetes Father    . Hyperlipidemia Father    .  Hypertension Father    . Cancer Father         Currently has cancer   . Crohn's disease Father    . Asthma Sister    . Diabetes Sister    . Hyperlipidemia Sister    . Hypertension Sister    . Early death Sister    . Cancer Brother         hx of  stomach   . Hyperlipidemia Brother    . Hypertension Brother    . Migraines Brother    . Learning disabilities Brother         Deaf   . Breast cancer Paternal Aunt         died of    . Breast cancer Other         maternal cousin-- survior   . Obesity Son        Social History     Socioeconomic History   . Marital status: Significant Other     Spouse name: Not on file   . Number of children: Not on file   . Years of education: Not on file   . Highest education level: Not on file   Occupational History   . Not on file   Tobacco Use   . Smoking status: Never Smoker   . Smokeless tobacco: Never Used   . Tobacco comment: None   Vaping Use   . Vaping Use: Never used   Substance and Sexual Activity   . Alcohol use: No   . Drug use: No   . Sexual activity: Not Currently     Partners: Male     Birth control/protection: Post-menopausal, Rhythm   Other Topics Concern   . Dietary supplements / vitamins Yes     Comment: Biotin B12 multi vitamins D3   . Anesthesia problems No   . Blood thinners No   . Pregnant No   . Future Children No   . Number of Pregnancies? No   . Number of children Yes     Comment: 1   . Miscarriages / Abortions? No   . Eats large amounts No   . Excessive Sweets No   . Skips meals No   . Eats excessive starches No   . Snacks or grazes No   . Emotional eater No   . Eats fried food No   . Eats fast food No   . Diet Center Yes   . Doylene Bode No   . [place] Weight Loss No   . Nutri-System No   . Opti-Fast / Medi-Fast Yes   . Overeaters Anonymous No   . [facility] Yes   . TOPS No   . Weight Watchers Yes   . Atkins Yes   . Binging / Purging No   . Calorie Counting Yes   . Fasting Yes   . High Protein Yes   . Low Carb Yes   . Low Fat No   . [facility] Diet No   . Slim Fast Yes   . [facility] No   . Stationary cycle or treadmill Yes   . Gym/fitness Classes Yes   . Home exercise/video Yes   . Swimming Yes   . Weight training Yes   . Walking or running Yes   . Hospitalization No   . Hypnosis No   .  Physical therapy Yes   . Psychological therapy Yes   . Residential program No   .  Acutrim No   . Byetta No   . Contrave No   . Dexatrim Yes   . Diethylpropion No   . Fastin No   . Fen - Phen Yes   . Ionamin / Adipex No   . Phentermine Yes   . Qsymia No   . Prozac Yes   . Saxenda No   . Topamax No   . Wellbutrin No   . Xenical (Orlistat, Alli) No   . Other Med No   . No impairment Yes   . Walks with cane/crutch Yes   . Requires a wheelchair No   . Bedridden No   . Are you currently being treated for depression? No   . Do you snore? No   . Are you receiving any medical or psychological services? No   . Do you ever wake up at night gasping for breath? No   . Do you have or have you been treated for an eating disorder? No   . Anyone ever told you that you stop breathing while asleep? No   . Do you exercise regularly? Yes   . Have you or family member ever have trouble with anesthesia? Yes   Social History Narrative   . Not on file     Social Determinants of Health     Financial Resource Strain:    . Difficulty of Paying Living Expenses:    Food Insecurity:    . Worried About Programme researcher, broadcasting/film/video in the Last Year:    . Barista in the Last Year:    Transportation Needs:    . Freight forwarder (Medical):    Marland Kitchen Lack of Transportation (Non-Medical):    Physical Activity:    . Days of Exercise per Week:    . Minutes of Exercise per Session:    Stress:    . Feeling of Stress :    Social Connections:    . Frequency of Communication with Friends and Family:    . Frequency of Social Gatherings with Friends and Family:    . Attends Religious Services:    . Active Member of Clubs or Organizations:    . Attends Banker Meetings:    Marland Kitchen Marital Status:    Intimate Partner Violence:    . Fear of Current or Ex-Partner:    . Emotionally Abused:    Marland Kitchen Physically Abused:    . Sexually Abused:      The following portions of the patient's history were reviewed and updated as appropriate: allergies, current medications,  past family history, past medical history, past social history, past surgical history and problem list.    Review of Systems     Objective:   Temp 98.2 F (36.8 C) (Temporal)   LMP  (LMP Unknown)   Wt Readings from Last 3 Encounters:   09/01/19 63.9 kg (140 lb 12.8 oz)   07/20/19 63.3 kg (139 lb 9.6 oz)   07/14/19 62.6 kg (138 lb)     Physical Exam  Constitutional:       General: She is not in acute distress.     Appearance: She is not ill-appearing.   Pulmonary:      Comments: Patient speaks in full sentences throughout visit without difficulty, without shortness of breath  Neurological:      Mental Status: She is alert.          Assessment/Plan:       1. Sore throat  -  Sofia(TM)SARS COVID-19 Antigen FIA POCT; Future  - COVID-19 (SARS-CoV-2); Future    2. Upper respiratory tract infection, unspecified type    Treatment recommendations: optimize H20 hydration, OTC Mucinex twice a day as needed and Vitamin C 500mg  twice a day (oxidative support)Antibiotic therapy is not indicated - etiology most likely not bacterial.    Increase oral hydration and ensure adequate nutrition and rest  Signs and symptoms that warrant return/urgent/emergent evaluation discussed   Mucinex PRN congestion/cough  Tylenol PRN pain/myalgia  Vitamin D/Zinc/Vitamin C for supplement support  Encouraged continue regular handwashing, masking, and social distancing       Return if symptoms worsen or fail to improve, for Schedule your annual physical for when you are well.    Jules Husbands, DO

## 2019-10-06 LAB — COVID-19 (SARS-COV-2): SARS CoV 2 Overall Result: DETECTED — AB

## 2019-10-10 ENCOUNTER — Telehealth (INDEPENDENT_AMBULATORY_CARE_PROVIDER_SITE_OTHER): Payer: Self-pay | Admitting: Family Medicine

## 2019-10-10 NOTE — Telephone Encounter (Signed)
Pt requests callback at [phone] in regards to test results and not feeling well

## 2019-10-10 NOTE — Telephone Encounter (Signed)
Pleas advise.

## 2019-10-10 NOTE — Telephone Encounter (Signed)
I have attempted to reach patient by phone on 10/07/2019, this morning and again this afternoon.  Goes directly to voicemail, unable to connect with patient.

## 2019-10-11 ENCOUNTER — Telehealth (INDEPENDENT_AMBULATORY_CARE_PROVIDER_SITE_OTHER): Payer: Self-pay | Admitting: Family Medicine

## 2019-10-11 ENCOUNTER — Emergency Department
Admission: EM | Admit: 2019-10-11 | Discharge: 2019-10-11 | Disposition: A | Discharge status: Home / Self Care | Payer: No Typology Code available for payment source | Attending: Emergency Medicine | Admitting: Emergency Medicine

## 2019-10-11 DIAGNOSIS — Z23 Encounter for immunization: Secondary | ICD-10-CM | POA: Insufficient documentation

## 2019-10-11 DIAGNOSIS — R001 Bradycardia, unspecified: Secondary | ICD-10-CM | POA: Insufficient documentation

## 2019-10-11 DIAGNOSIS — Z7951 Long term (current) use of inhaled steroids: Secondary | ICD-10-CM | POA: Insufficient documentation

## 2019-10-11 DIAGNOSIS — R519 Headache, unspecified: Secondary | ICD-10-CM

## 2019-10-11 DIAGNOSIS — U071 COVID-19: Secondary | ICD-10-CM | POA: Insufficient documentation

## 2019-10-11 MED ORDER — DIPHENHYDRAMINE HCL 50 MG/ML IJ SOLN
25.00 mg | Freq: Once | INTRAMUSCULAR | Status: AC
Start: 2019-10-11 — End: 2019-10-11
  Administered 2019-10-11: 17:00:00 25 mg via INTRAVENOUS

## 2019-10-11 MED ORDER — SODIUM CHLORIDE 0.9 % IV SOLN
Freq: Once | INTRAVENOUS | Status: AC
Start: 2019-10-11 — End: 2019-10-11

## 2019-10-11 MED ORDER — KETOROLAC TROMETHAMINE 30 MG/ML IJ SOLN
15.00 mg | Freq: Once | INTRAMUSCULAR | Status: AC
Start: 2019-10-11 — End: 2019-10-11
  Administered 2019-10-11: 17:00:00 15 mg via INTRAVENOUS

## 2019-10-11 MED ORDER — ACETAMINOPHEN 325 MG PO TABS
650.0000 mg | ORAL_TABLET | Freq: Once | ORAL | Status: DC | PRN
Start: 2019-10-11 — End: 2019-10-11

## 2019-10-11 MED ORDER — DIPHENHYDRAMINE HCL 50 MG/ML IJ SOLN
50.0000 mg | Freq: Once | INTRAMUSCULAR | Status: DC | PRN
Start: 2019-10-11 — End: 2019-10-11

## 2019-10-11 MED ORDER — METOCLOPRAMIDE HCL 5 MG/ML IJ SOLN
10.00 mg | Freq: Once | INTRAMUSCULAR | Status: AC
Start: 2019-10-11 — End: 2019-10-11
  Administered 2019-10-11: 17:00:00 10 mg via INTRAVENOUS

## 2019-10-11 MED ORDER — HYDROCORTISONE SOD SUC (PF) 100 MG IJ SOLR (WRAP)
100.0000 mg | Freq: Once | INTRAMUSCULAR | Status: DC | PRN
Start: 2019-10-11 — End: 2019-10-11

## 2019-10-11 MED ORDER — ALBUTEROL SULFATE HFA 108 (90 BASE) MCG/ACT IN AERS
2.0000 | INHALATION_SPRAY | RESPIRATORY_TRACT | 0 refills | Status: DC | PRN
Start: 2019-10-11 — End: 2019-11-10

## 2019-10-11 MED ORDER — SODIUM CHLORIDE 0.9 % IV BOLUS
500.00 mL | Freq: Once | INTRAVENOUS | Status: DC | PRN
Start: 2019-10-11 — End: 2019-10-11

## 2019-10-11 MED ORDER — DIPHENHYDRAMINE HCL 50 MG/ML IJ SOLN
25.00 mg | Freq: Once | INTRAMUSCULAR | Status: DC | PRN
Start: 2019-10-11 — End: 2019-10-11

## 2019-10-11 MED ORDER — ONDANSETRON HCL 4 MG/2ML IJ SOLN
4.0000 mg | Freq: Once | INTRAMUSCULAR | Status: DC | PRN
Start: 2019-10-11 — End: 2019-10-11

## 2019-10-11 MED ORDER — SODIUM CHLORIDE 0.9 % IV BOLUS
500.00 mL | Freq: Once | INTRAVENOUS | Status: DC
Start: 2019-10-11 — End: 2019-10-11

## 2019-10-11 MED ORDER — FAMOTIDINE 10 MG/ML IV SOLN (WRAP)
20.0000 mg | Freq: Once | INTRAVENOUS | Status: DC | PRN
Start: 2019-10-11 — End: 2019-10-11

## 2019-10-11 MED ORDER — SODIUM CHLORIDE 0.9 % IV SOLN
25.00 mL/h | INTRAVENOUS | Status: DC | PRN
Start: 2019-10-11 — End: 2019-10-11

## 2019-10-11 MED ORDER — EPINEPHRINE HCL 1 MG/ML ADULT ANAPHYLAXIS KIT
0.3000 mg | Freq: Once | INTRAMUSCULAR | Status: DC | PRN
Start: 2019-10-11 — End: 2019-10-11

## 2019-10-11 MED ORDER — MAGNESIUM SULFATE IN D5W 1-5 GM/100ML-% IV SOLN
1.00 g | Freq: Once | INTRAVENOUS | Status: AC
Start: 2019-10-11 — End: 2019-10-11
  Administered 2019-10-11: 17:00:00 1 g via INTRAVENOUS

## 2019-10-11 NOTE — Discharge Instructions (Signed)
Follow-up with primary care doctor.  Use SPO2 detector for any shortness of breath.  Make sure you sit for a minute prior to looking at reading.  If less than 90 and return to the emergency department.  Can use Tylenol or Motrin as needed.  Can use albuterol 2 puffs every 4 hours as needed.  Return with new or worsening symptoms.   Make sure to continue to self isolate for 14 days from onset of symptoms.

## 2019-10-11 NOTE — Telephone Encounter (Signed)
Pt. Is calling back states she was having phone issues but would like a call back from Dr. Please advise. States she fixed her phone issue now.

## 2019-10-11 NOTE — ED Triage Notes (Signed)
+   COVID, headache, generalized weakness. Sent by Pmd for further eval. Pt in NAD

## 2019-10-11 NOTE — Telephone Encounter (Signed)
I was able to reach patient.  Phone call note documented under result note

## 2019-10-11 NOTE — EDIE (Signed)
COLLECTIVE?NOTIFICATION?10/11/2019 16:22?Tracy [name], Tracy [name]?MRN: [id number]    Criteria Met      COVID-19 Positive Lab Results    Security and Safety  No recent Security Events currently on file    ED Care Guidelines  There are currently no ED Care Guidelines for this patient. Please check your facility's medical records system.    Flags      Positive COVID-19 Lab Result - [facility] - A specimen collected from this patient was positive for COVID-19 / Attributed By: [place] [facility] / Attributed On: 10/08/2019           E.[name]. Visit Count (12 mo.)  Facility Visits   [facility] 1   [place] [facility] 1   [place] [facility] 1   Total 3   Note: Visits indicate total known visits.     Recent Emergency Department Visit Summary  Date Facility [facility] Type Diagnoses or Chief Complaint   Oct 11, 2019 [place] [facility] [place] [facility]      Covid-19      Jul 05, 2019 [facility] [place] [facility]      headache;abd pain;weakness      Headache      Abdominal Pain      Headache, unspecified      Dehydration      Noninfective gastroenteritis and colitis, unspecified      Mar 27, 2019 [facility] [place] [place] [facility]      CHEST PAIN      CHEST PAIN ADULT BACK PAIN      Chest pain, unspecified      4. Dorsalgia, unspecified          Recent Inpatient Visit Summary  No recorded inpatient visits.     Care Team  Provider Specialty Phone Fax Service Dates   Harland German , MD Family Medicine [phone] Current      Collective Portal  This patient has registered at the [facility] Emergency Room: [facility] Department   For more information visit: [email or web address]     PLEASE NOTE:     1.   Any care recommendations and other clinical information are provided as guidelines or for historical purposes only, and  providers should exercise their own clinical judgment when providing care.    2.   You may only use this information for purposes of treatment, payment or health care operations activities, and subject to the limitations of applicable Collective Policies.    3.   You should consult directly with the organization that provided a care guideline or other clinical history with any questions about additional information or accuracy or completeness of information provided.    ? 2021 [place], [employer] - [email or web address]

## 2019-10-11 NOTE — ED Provider Notes (Signed)
EMERGENCY DEPARTMENT NOTE     Patient initially seen and examined at   ED PHYSICIAN ASSIGNED     None         ED MIDLEVEL (APP) ASSIGNED     Date/Time Event User Comments    10/11/19 1636 PA/NP Provider Assigned Isidore Margraf, Marinus Maw, NP assigned as Nurse Practitioner          HISTORY OF PRESENT ILLNESS   Historian:Patient  Translator Used: No    Chief Complaint: Headache     Mechanism of Injury:       54 y.o. female history of asthma, GERD, anemia, hyperthyroidism, carotid stenosis, hepatic cirrhosis, migraines, Presents for monoclonal antibody infusion. Patient stated last Monday 8 days ago started having headache, sore throat loss of taste and smell got tested for Covid tested positive and was told to come into the emergency department for monoclonal infusion.  Patient says that she is fully vaccinated. No CP or SOB. No nausea vomiting diarrhea or abdominal pain no dysuria frequency.  1. Location of symptoms: head  2. Onset of symptoms: 8 days ago  3. What was patient doing when symptoms started (Context): see above  4. Severity: moderate  5. Timing: acute intermittent in severity   6. Activities that worsen symptoms: unknown  7. Activities that improve symptoms: medications  8. Quality: aching   9. Radiation of symptoms: no  10. Associated signs and Symptoms: see above  11. Are symptoms worsening? yes  MEDICAL HISTORY     Past Medical History:  Past Medical History:   Diagnosis Date   . Anemia    . Arthritis    . Carotid stenosis    . Diarrhea 12/2016    Since I had my dual switch surgery   . Fatty liver 2017   . GERD (gastroesophageal reflux disease)    . Hepatic cirrhosis    . Hiatal hernia    . Hyperthyroidism 2013    Thyroid removed   . Sleep apnea     not using CPAP machine   . Vitamin D deficiency        Past Surgical History:  Past Surgical History:   Procedure Laterality Date   . CHOLECYSTECTOMY  12/2016   . COLONOSCOPY, BIOPSY N/A 01/27/2018    Procedure: COLONOSCOPY, BIOPSY;   Surgeon: Shanon Brow, MD;  Location: [facility];  Service: Gastroenterology;  Laterality: N/A;   . 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 EGD, BIOPSY N/A 01/27/2018    Procedure: EGD, BIOPSY;  Surgeon: Shanon Brow, MD;  Location: [facility];  Service: Gastroenterology;  Laterality: N/A;   . GASTRECTOMY  09/2013   . HYSTERECTOMY  2015    Partial.   . LAPAROSCOPIC REVISION SLEEVE GASTRECTOMY TO GASTRIC BYPASS     . LAPAROSCOPIC, GASTRECTOMY SLEEVE     . SALPINGO OOPHERECTOMY Bilateral    . THYROIDECTOMY  2013       Social History:  Social History     Socioeconomic History   . Marital status: Significant Other     Spouse name: Not on file   . Number of children: Not on file   . Years of education: Not on file   . Highest education level: Not on file   Occupational History   . Not on file   Tobacco Use   . Smoking status: Never Smoker   . Smokeless tobacco: Never Used   . Tobacco comment: None   Vaping Use   .  Vaping Use: Never used   Substance and Sexual Activity   . Alcohol use: No   . Drug use: No   . Sexual activity: Not Currently     Partners: Male     Birth control/protection: Post-menopausal, Rhythm   Other Topics Concern   . Dietary supplements / vitamins Yes     Comment: Biotin B12 multi vitamins D3   . Anesthesia problems No   . Blood thinners No   . Pregnant No   . Future Children No   . Number of Pregnancies? No   . Number of children Yes     Comment: 1   . Miscarriages / Abortions? No   . Eats large amounts No   . Excessive Sweets No   . Skips meals No   . Eats excessive starches No   . Snacks or grazes No   . Emotional eater No   . Eats fried food No   . Eats fast food No   . Diet Center Yes   . Doylene Bode No   . [place] Weight Loss No   . Nutri-System No   . Opti-Fast / Medi-Fast Yes   . Overeaters Anonymous No   . [facility] Yes   . TOPS No   . Weight Watchers Yes   . Atkins Yes   . Binging / Purging No   . Calorie Counting Yes   . Fasting Yes   . High Protein Yes   . Low  Carb Yes   . Low Fat No   . [facility] Diet No   . Slim Fast Yes   . [facility] No   . Stationary cycle or treadmill Yes   . Gym/fitness Classes Yes   . Home exercise/video Yes   . Swimming Yes   . Weight training Yes   . Walking or running Yes   . Hospitalization No   . Hypnosis No   . Physical therapy Yes   . Psychological therapy Yes   . Residential program No   . Acutrim No   . Byetta No   . Contrave No   . Dexatrim Yes   . Diethylpropion No   . Fastin No   . Fen - Phen Yes   . Ionamin / Adipex No   . Phentermine Yes   . Qsymia No   . Prozac Yes   . Saxenda No   . Topamax No   . Wellbutrin No   . Xenical (Orlistat, Alli) No   . Other Med No   . No impairment Yes   . Walks with cane/crutch Yes   . Requires a wheelchair No   . Bedridden No   . Are you currently being treated for depression? No   . Do you snore? No   . Are you receiving any medical or psychological services? No   . Do you ever wake up at night gasping for breath? No   . Do you have or have you been treated for an eating disorder? No   . Anyone ever told you that you stop breathing while asleep? No   . Do you exercise regularly? Yes   . Have you or family member ever have trouble with anesthesia? Yes   Social History Narrative   . Not on file     Social Determinants of Health     Financial Resource Strain:    . Difficulty of Paying Living Expenses:    Food Insecurity:    .  Worried About Programme researcher, broadcasting/film/video in the Last Year:    . Barista in the Last Year:    Transportation Needs:    . Freight forwarder (Medical):    Marland Kitchen Lack of Transportation (Non-Medical):    Physical Activity:    . Days of Exercise per Week:    . Minutes of Exercise per Session:    Stress:    . Feeling of Stress :    Social Connections:    . Frequency of Communication with Friends and Family:    . Frequency of Social Gatherings with Friends and Family:    . Attends Religious Services:    . Active Member of Clubs or Organizations:    . Attends Banker  Meetings:    Marland Kitchen Marital Status:    Intimate Partner Violence:    . Fear of Current or Ex-Partner:    . Emotionally Abused:    Marland Kitchen Physically Abused:    . Sexually Abused:        Family History:  Family History   Problem Relation Age of Onset   . Cancer Mother         hx ovarian   . Hypertension Mother    . Inflammatory bowel disease Mother    . Heart failure Mother    . Angina Mother    . Migraines Mother    . Asthma Father    . Diabetes Father    . Hyperlipidemia Father    . Hypertension Father    . Cancer Father         Currently has cancer   . Crohn's disease Father    . Asthma Sister    . Diabetes Sister    . Hyperlipidemia Sister    . Hypertension Sister    . Early death Sister    . Cancer Brother         hx of stomach   . Hyperlipidemia Brother    . Hypertension Brother    . Migraines Brother    . Learning disabilities Brother         Deaf   . Breast cancer Paternal Aunt         died of    . Breast cancer Other         maternal cousin-- survior   . Obesity Son        Outpatient Medication:  Discharge Medication List as of 10/11/2019  7:20 PM      CONTINUE these medications which have NOT CHANGED    Details   Armour Thyroid 60 MG tablet TAKE 1 TABLET(60 MG) BY MOUTH DAILY, E-Rx      vitamin D (CHOLECALCIFEROL) 25 MCG (1000 UT) tablet Take 1,000 Units by mouth daily, Historical Med      !! albuterol sulfate HFA (PROVENTIL) 108 (90 Base) MCG/ACT inhaler Inhale 2 puffs into the lungs every 4 (four) hours as needed for Wheezing or Shortness of Breath (cough), Starting Fri 04/22/2019, E-Rx      Beclomethasone Dipropionate (Qnasl) 80 MCG/ACT Aero Soln 2 sprays by Nasal route daily, Starting Fri 04/22/2019, Normal      Biotin 5 MG/ML Liquid Take by mouth daily   , Historical Med      budesonide-formoterol (SYMBICORT) 160-4.5 MCG/ACT inhaler Inhale 2 puffs into the lungs 2 (two) times daily, Starting Thu 11/25/2018, Until Fri 11/25/2019, E-Rx      butalbital-acetaminophen-caffeine (FIORICET, ESGIC) 50-325-40 MG per tablet  Take 1 tablet by mouth  every 4 (four) hours as needed for Headaches, Starting Thu 10/15/2017, Print      calcium carbonate 1500 (600 Ca) MG Tab tablet Take 600 mg by mouth, Historical Med      calcium-vitamin D (OSCAL-500 + D) 500-200 MG-UNIT per tablet Take 1 tablet by mouth daily, Historical Med      cetirizine (ZyrTEC Allergy) 10 MG tablet Take 1 tablet (10 mg total) by mouth daily, Starting Thu 04/21/2019, E-Rx      Cyanocobalamin 500 MCG/0.1ML Solution 1 spray via nares weekly, E-Rx      diclofenac Sodium (VOLTAREN) 1 % Gel topical gel Apply 4 g to affected area 4 times daily as needed.  Max: 16 g/joint/day.  Do not apply to broken, inflamed, or infected skin., Normal      montelukast (SINGULAIR) 10 MG tablet TAKE 1 TABLET(10 MG) BY MOUTH EVERY NIGHT, E-Rx      Multiple Vitamins-Minerals (MULTIVITAMIN WITH MINERALS) tablet Take 1 tablet by mouth daily, Historical Med      Spacer/Aero-Holding Chambers (AEROCHAMBER Z-STAT PLUS/MEDIUM) inhaler Use as instructed, Print       !! - Potential duplicate medications found. Please discuss with provider.            REVIEW OF SYSTEMS   Review of Systems   Constitutional: Negative.    HENT: Positive for sore throat.    Respiratory: Negative.    Cardiovascular: Negative.    Gastrointestinal: Negative.    Genitourinary: Negative.    Musculoskeletal: Negative.    Skin: Negative.    Neurological: Positive for headaches. Negative for dizziness, tremors, seizures, syncope, facial asymmetry, speech difficulty, weakness, light-headedness and numbness.   Psychiatric/Behavioral: Negative.    All other systems reviewed and are negative.             PHYSICAL EXAM     ED Triage Vitals [10/11/19 1633]   Enc Vitals Group      BP 155/81      Heart Rate (!) 52      Resp Rate 15      Temp 97.8 F (36.6 C)      Temp src       SpO2 100 %      Weight 62.6 kg      Height       Head Circumference       Peak Flow       Pain Score 8      Pain Loc       Pain Edu?       Excl. in GC?      Physical  Exam  Constitutional:       General: She is not in acute distress.     Appearance: Normal appearance. She is not ill-appearing, toxic-appearing or diaphoretic.   HENT:      Head: Normocephalic and atraumatic.      Nose: Nose normal.      Mouth/Throat:      Mouth: Mucous membranes are moist.      Pharynx: Oropharynx is clear. No oropharyngeal exudate or posterior oropharyngeal erythema.   Eyes:      Extraocular Movements: Extraocular movements intact.      Conjunctiva/sclera: Conjunctivae normal.      Pupils: Pupils are equal, round, and reactive to light.   Cardiovascular:      Rate and Rhythm: Regular rhythm. Bradycardia present.      Pulses: Normal pulses.      Heart sounds: Normal heart sounds.   Pulmonary:  Effort: Pulmonary effort is normal.      Breath sounds: Normal breath sounds.   Abdominal:      General: Abdomen is flat.      Palpations: Abdomen is soft.   Musculoskeletal:         General: Normal range of motion.      Cervical back: Normal range of motion and neck supple. No rigidity or tenderness.   Skin:     General: Skin is warm.      Capillary Refill: Capillary refill takes less than 2 seconds.   Neurological:      General: No focal deficit present.      Mental Status: She is alert and oriented to person, place, and time.      Cranial Nerves: No cranial nerve deficit.      Sensory: No sensory deficit.      Motor: No weakness.      Coordination: Coordination normal.      Gait: Gait normal.   Psychiatric:         Mood and Affect: Mood normal.         Behavior: Behavior normal.         Thought Content: Thought content normal.         Judgment: Judgment normal.           MEDICAL DECISION MAKING     DISCUSSION    Patient alert, no acute distress, states having headache that Tylenol and Motrin has not been helping.    We will give magnesium, Benadryl, Reglan, fluids for migraine symptoms.    Discussed monoclonal antibody infusion, patient agrees and wants infusion.  Information sheet given.    Pt  tolerated infusion without problems, obs for 1 hours after infusion. reviewed home care of covid. Pt safe and stable for [place].      All labs and vital signs from the current visit have been reviewed and any abnormality that is present is not due to severe sepsis or septic shock.    Vital Signs: Reviewed the patient's vital signs.   Nursing Notes: Reviewed and utilized available nursing notes.  Medical Records Reviewed: Reviewed available past medical records.  Counseling: The emergency provider has spoken with the patient and discussed today's findings, in addition to providing specific details for the plan of care.  Questions are answered and there is agreement with the plan.        PULSE OXIMETRY    Oxygen Saturation by Pulse Oximetry: 100%  Interventions: none  Interpretation:  WNL ED NP     EMERGENCY DEPT. MEDICATIONS      ED Medication Orders (From admission, onward)    Start Ordered     Status Ordering Provider    10/11/19 1701 10/11/19 1702  casirivimab-imdevimab 600-600 mg 10 mL in sodium chloride 0.9 % 115 mL infusion (EUA)  Once     Route: Intravenous     Last MAR action: Given Czar Ysaguirre E    10/11/19 1659 10/11/19 1702    Continuous PRN     Route: Intravenous  Ordered Dose: 25 mL/hr     Discontinued Ashunti Schofield E    10/11/19 1659 10/11/19 1702    once PRN     Route: Intravenous  Ordered Dose: 500 mL     Discontinued Madhav Mohon E    10/11/19 1659 10/11/19 1702    once PRN     Route: Intravenous  Ordered Dose: 25 mg     Discontinued Sophina Mitten E  10/11/19 1659 10/11/19 1702    once PRN     Route: Intravenous  Ordered Dose: 50 mg     Discontinued Birdia Jaycox E    10/11/19 1659 10/11/19 1702    once PRN     Route: Intravenous  Ordered Dose: 20 mg     Discontinued Jarius Dieudonne E    10/11/19 1659 10/11/19 1702    once PRN     Route: Intravenous  Ordered Dose: 100 mg     Discontinued Esaw Knippel E    10/11/19 1659 10/11/19 1702    once PRN     Route:  Intramuscular  Ordered Dose: 0.3 mg     Discontinued Janece Laidlaw E    10/11/19 1659 10/11/19 1702    once PRN     Route: Oral  Ordered Dose: 650 mg     Discontinued Taegan Standage E    10/11/19 1659 10/11/19 1702    once PRN     Route: Intravenous  Ordered Dose: 4 mg     Discontinued Haskel Dewalt E    10/11/19 1653 10/11/19 1652  magnesium sulfate 1g in dextrose 5% IVPB (premix)  Once     Route: Intravenous  Ordered Dose: 1 g     Last MAR action: Stopped Hadassa Cermak E    10/11/19 1653 10/11/19 1652  diphenhydrAMINE (BENADRYL) injection 25 mg  Once     Route: Intravenous  Ordered Dose: 25 mg     Last MAR action: Given Davier Tramell E    10/11/19 1653 10/11/19 1652  metoclopramide (REGLAN) injection 10 mg  Once     Route: Intravenous  Ordered Dose: 10 mg     Last MAR action: Given Lejon Afzal E    10/11/19 1653 10/11/19 1652    Once     Route: Intravenous  Ordered Dose: 500 mL     Discontinued Reza Crymes E    10/11/19 1652 10/11/19 1652  ketorolac (TORADOL) injection 15 mg  Once     Route: Intravenous  Ordered Dose: 15 mg     Last MAR action: Given Arber Wiemers E          LABORATORY RESULTS    Ordered and independently interpreted AVAILABLE laboratory tests. Please see results section in chart for full details.  Results for orders placed or performed in visit on 10/05/19   COVID-19 (SARS-CoV-2)    Specimen: Nasopharynx; Nasopharyngeal Swab   Result Value Ref Range    Purpose of COVID testing Diagnostic -PUI     SARS-CoV-2 Specimen Source Nasal Swab     SARS CoV 2 Overall Result Detected (A)    [facility](TM)SARS COVID-19 Antigen FIA POCT   Result Value Ref Range    [facility] SARS COV2 Antigen POCT Negative Negative       CRITICAL CARE/PROCEDURES    Procedures      DIAGNOSIS      Diagnosis:  Final diagnoses:   COVID-19 virus detected   Nonintractable headache, unspecified chronicity pattern, unspecified headache type       Disposition:  ED Disposition     ED  Disposition Condition Date/Time Comment    Discharge  Tue Oct 11, 2019  7:20 PM Tracy [name] discharge to home/self care.    Condition at disposition: Stable          Prescriptions:  Discharge Medication List as of 10/11/2019  7:20 PM      START taking these medications    Details   !!  albuterol sulfate HFA (PROVENTIL) 108 (90 Base) MCG/ACT inhaler Inhale 2 puffs into the lungs every 4 (four) hours as needed for Wheezing or Shortness of Breath (coughing) Dispense with spacer, Starting Tue 10/11/2019, Until Thu 11/10/2019 at 2359, E-Rx       !! - Potential duplicate medications found. Please discuss with provider.      CONTINUE these medications which have NOT CHANGED    Details   Armour Thyroid 60 MG tablet TAKE 1 TABLET(60 MG) BY MOUTH DAILY, E-Rx      vitamin D (CHOLECALCIFEROL) 25 MCG (1000 UT) tablet Take 1,000 Units by mouth daily, Historical Med      !! albuterol sulfate HFA (PROVENTIL) 108 (90 Base) MCG/ACT inhaler Inhale 2 puffs into the lungs every 4 (four) hours as needed for Wheezing or Shortness of Breath (cough), Starting Fri 04/22/2019, E-Rx      Beclomethasone Dipropionate (Qnasl) 80 MCG/ACT Aero Soln 2 sprays by Nasal route daily, Starting Fri 04/22/2019, Normal      Biotin 5 MG/ML Liquid Take by mouth daily   , Historical Med      budesonide-formoterol (SYMBICORT) 160-4.5 MCG/ACT inhaler Inhale 2 puffs into the lungs 2 (two) times daily, Starting Thu 11/25/2018, Until Fri 11/25/2019, E-Rx      butalbital-acetaminophen-caffeine (FIORICET, ESGIC) 50-325-40 MG per tablet Take 1 tablet by mouth every 4 (four) hours as needed for Headaches, Starting Thu 10/15/2017, Print      calcium carbonate 1500 (600 Ca) MG Tab tablet Take 600 mg by mouth, Historical Med      calcium-vitamin D (OSCAL-500 + D) 500-200 MG-UNIT per tablet Take 1 tablet by mouth daily, Historical Med      cetirizine (ZyrTEC Allergy) 10 MG tablet Take 1 tablet (10 mg total) by mouth daily, Starting Thu 04/21/2019, E-Rx      Cyanocobalamin  500 MCG/0.1ML Solution 1 spray via nares weekly, E-Rx      diclofenac Sodium (VOLTAREN) 1 % Gel topical gel Apply 4 g to affected area 4 times daily as needed.  Max: 16 g/joint/day.  Do not apply to broken, inflamed, or infected skin., Normal      montelukast (SINGULAIR) 10 MG tablet TAKE 1 TABLET(10 MG) BY MOUTH EVERY NIGHT, E-Rx      Multiple Vitamins-Minerals (MULTIVITAMIN WITH MINERALS) tablet Take 1 tablet by mouth daily, Historical Med      Spacer/Aero-Holding Chambers (AEROCHAMBER Z-STAT PLUS/MEDIUM) inhaler Use as instructed, Print       !! - Potential duplicate medications found. Please discuss with provider.           Lewis Shock, NP  10/13/19 1954

## 2019-10-11 NOTE — Telephone Encounter (Signed)
Left VM for pt, I was returning her call to discuss her symptoms and assess how she is doing. Will try again later to reach pt. Goes straight to voicemail.

## 2019-10-14 ENCOUNTER — Encounter (INDEPENDENT_AMBULATORY_CARE_PROVIDER_SITE_OTHER): Payer: Self-pay

## 2019-10-15 ENCOUNTER — Encounter (INDEPENDENT_AMBULATORY_CARE_PROVIDER_SITE_OTHER): Payer: Self-pay

## 2019-11-07 ENCOUNTER — Telehealth (INDEPENDENT_AMBULATORY_CARE_PROVIDER_SITE_OTHER): Payer: Self-pay | Admitting: Family Medicine

## 2019-11-07 NOTE — Telephone Encounter (Signed)
Patient called stating that she needs to have a mri done as soon as possible. Patient stated that since she has received the pneumonia vaccine her sym has gotten worse. Patient stated that she is having pain from her shoulder to her neck down to her lower back all on the right side. Patient stated that she is no longer able to even reach back to scratch her back on her right side.    Please advise     Thank you

## 2019-11-07 NOTE — Telephone Encounter (Signed)
Left VM for pt regarding neck/shoulder pain. Recommend pt to call us for an appointment for provider to evaluate symptoms as shoulder/neck pain is not a common side effect from Shingles vaccine. If she is experiencing immense pain, it's best for her to see the ER as they have capabilities to order any imaging.

## 2019-11-07 NOTE — Telephone Encounter (Signed)
I will triage pt and call her. thanks

## 2019-11-09 ENCOUNTER — Ambulatory Visit (INDEPENDENT_AMBULATORY_CARE_PROVIDER_SITE_OTHER): Payer: No Typology Code available for payment source | Admitting: Family Medicine

## 2019-11-09 ENCOUNTER — Encounter (INDEPENDENT_AMBULATORY_CARE_PROVIDER_SITE_OTHER): Payer: Self-pay | Admitting: Family Medicine

## 2019-11-09 VITALS — BP 129/79 | HR 58 | Temp 97.6°F | Resp 14 | Wt 140.6 lb

## 2019-11-09 DIAGNOSIS — M67911 Unspecified disorder of synovium and tendon, right shoulder: Secondary | ICD-10-CM

## 2019-11-09 DIAGNOSIS — G8929 Other chronic pain: Secondary | ICD-10-CM

## 2019-11-09 DIAGNOSIS — M25511 Pain in right shoulder: Secondary | ICD-10-CM

## 2019-11-09 NOTE — Progress Notes (Signed)
Subjective:    Date: 11/09/2019 6:58 AM   Patient ID: Tracy [name] is a 54 y.o. female.    Chief Complaint   Patient presents with   . Pain     the patient has a c/o right shoulder pain and is requesting an MRI   . MRI     the patient would like a MRI of the head     HPI     Pt c/o pain of right lateral upper chest, upper lateral back,  right arm and right shoulder blade pain since pneumonia shot on 07/14/2019    Affecting her sleep    Pt denies any history of right shoulder pain/condition    Pt endorses numbness and tingling of right arm.     Pt reports pain radiates from around the shoulder to her neck, but denies pain from the neck area    Pt is taking tylenol and reports no improvement    Pt reports reduced range of motion of shoulder    Pt is right handed.     No problems updated.    Patient Active Problem List   Diagnosis   . Migraine   . History of hypertension   . Abdominal cyst   . Hiatal hernia   . Hypokalemia   . S/P gastric bypass   . Fatty liver   . Postsurgical hypothyroidism   . Tubular adenoma of colon   . Allergic rhinitis   . Sleep difficulties   . Difficulty concentrating   . Forgetfulness   . Hx of migraine headaches   . Occipital headache   . Frontal headache   . Hx of concussion   . White matter abnormality on MRI of brain   . Hx of sleep apnea   . Irritable bowel syndrome with diarrhea   . Chronic diarrhea     Patient Care Team:  Jules Husbands, DO as PCP - General (Family Medicine)  Einar Grad, MD as Consulting Physician (Gastroenterology)  Zerita Boers, MD as Consulting Physician (Endocrinology, Diabetes and Metabolism)  Campbell Lerner, MD as Consulting Physician (Neurology)    Immunization History   Administered Date(s) Administered   . COVID-19 mRNA Vaccine Preservative Free 0.3 mL (PFIZER) 04/16/2019, 05/14/2019   . Pneumococcal 23 valent 07/14/2019   . Tdap 01/07/2011   . Zoster [facility]) Vaccine Recombinant 07/14/2019     Current Outpatient Medications   Medication Sig  Dispense Refill   . albuterol sulfate HFA (PROVENTIL) 108 (90 Base) MCG/ACT inhaler Inhale 2 puffs into the lungs every 4 (four) hours as needed for Wheezing or Shortness of Breath (cough) 1 Inhaler 0   . albuterol sulfate HFA (PROVENTIL) 108 (90 Base) MCG/ACT inhaler Inhale 2 puffs into the lungs every 4 (four) hours as needed for Wheezing or Shortness of Breath (coughing) Dispense with spacer 6.7 g 0   . Armour Thyroid 60 MG tablet TAKE 1 TABLET(60 MG) BY MOUTH DAILY 30 tablet 5   . Beclomethasone Dipropionate (Qnasl) 80 MCG/ACT Aero Soln 2 sprays by Nasal route daily 3 Inhaler 1   . Biotin 5 MG/ML Liquid Take by mouth daily        . budesonide-formoterol (SYMBICORT) 160-4.5 MCG/ACT inhaler Inhale 2 puffs into the lungs 2 (two) times daily 1 Inhaler 5   . butalbital-acetaminophen-caffeine (FIORICET, ESGIC) 50-325-40 MG per tablet Take 1 tablet by mouth every 4 (four) hours as needed for Headaches 10 tablet 0   . calcium carbonate 1500 (600 Ca) MG Tab  tablet Take 600 mg by mouth     . calcium-vitamin D (OSCAL-500 + D) 500-200 MG-UNIT per tablet Take 1 tablet by mouth daily     . cetirizine (ZyrTEC Allergy) 10 MG tablet Take 1 tablet (10 mg total) by mouth daily 90 tablet 0   . Cyanocobalamin 500 MCG/0.1ML Solution 1 spray via nares weekly 1 Bottle 3   . diclofenac Sodium (VOLTAREN) 1 % Gel topical gel Apply 4 g to affected area 4 times daily as needed.  Max: 16 g/joint/day.  Do not apply to broken, inflamed, or infected skin. 100 g 1   . montelukast (SINGULAIR) 10 MG tablet TAKE 1 TABLET(10 MG) BY MOUTH EVERY NIGHT 90 tablet 0   . Multiple Vitamins-Minerals (MULTIVITAMIN WITH MINERALS) tablet Take 1 tablet by mouth daily     . Spacer/Aero-Holding Chambers (AEROCHAMBER Z-STAT PLUS/MEDIUM) inhaler Use as instructed 1 each 0   . vitamin D (CHOLECALCIFEROL) 25 MCG (1000 UT) tablet Take 1,000 Units by mouth daily       No current facility-administered medications for this visit.     Allergies   Allergen Reactions   .  Bee Pollen Swelling     "Bee Stings".Marland KitchenMarland KitchenMarland KitchenStuffy nose; "throat swelling"     . Vicodin [Hydrocodone-Acetaminophen] Nausea And Vomiting       Past Medical History:   Diagnosis Date   . Anemia    . Arthritis    . Carotid stenosis    . Diarrhea 12/2016    Since I had my dual switch surgery   . Fatty liver 2017   . GERD (gastroesophageal reflux disease)    . Hepatic cirrhosis    . Hiatal hernia    . Hyperthyroidism 2013    Thyroid removed   . Sleep apnea     not using CPAP machine   . Vitamin D deficiency      Past Surgical History:   Procedure Laterality Date   . CHOLECYSTECTOMY  12/2016   . COLONOSCOPY, BIOPSY N/A 01/27/2018    Procedure: COLONOSCOPY, BIOPSY;  Surgeon: Shanon Brow, MD;  Location: [facility];  Service: Gastroenterology;  Laterality: N/A;   . 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 EGD, BIOPSY N/A 01/27/2018    Procedure: EGD, BIOPSY;  Surgeon: Shanon Brow, MD;  Location: [facility];  Service: Gastroenterology;  Laterality: N/A;   . GASTRECTOMY  09/2013   . HYSTERECTOMY  2015    Partial.   . LAPAROSCOPIC REVISION SLEEVE GASTRECTOMY TO GASTRIC BYPASS     . LAPAROSCOPIC, GASTRECTOMY SLEEVE     . SALPINGO OOPHERECTOMY Bilateral    . THYROIDECTOMY  2013       Family History   Problem Relation Age of Onset   . Cancer Mother         hx ovarian   . Hypertension Mother    . Inflammatory bowel disease Mother    . Heart failure Mother    . Angina Mother    . Migraines Mother    . Asthma Father    . Diabetes Father    . Hyperlipidemia Father    . Hypertension Father    . Cancer Father         Currently has cancer   . Crohn's disease Father    . Asthma Sister    . Diabetes Sister    . Hyperlipidemia Sister    . Hypertension Sister    . Early death Sister    . Cancer Brother  hx of stomach   . Hyperlipidemia Brother    . Hypertension Brother    . Migraines Brother    . Learning disabilities Brother         Deaf   . Breast cancer Paternal Aunt         died of    . Breast cancer Other          maternal cousin-- survior   . Obesity Son        Social History     Socioeconomic History   . Marital status: Significant Other     Spouse name: Not on file   . Number of children: Not on file   . Years of education: Not on file   . Highest education level: Not on file   Occupational History   . Not on file   Tobacco Use   . Smoking status: Never Smoker   . Smokeless tobacco: Never Used   . Tobacco comment: None   Vaping Use   . Vaping Use: Never used   Substance and Sexual Activity   . Alcohol use: No   . Drug use: No   . Sexual activity: Not Currently     Partners: Male     Birth control/protection: Post-menopausal, Rhythm   Other Topics Concern   . Dietary supplements / vitamins Yes     Comment: Biotin B12 multi vitamins D3   . Anesthesia problems No   . Blood thinners No   . Pregnant No   . Future Children No   . Number of Pregnancies? No   . Number of children Yes     Comment: 1   . Miscarriages / Abortions? No   . Eats large amounts No   . Excessive Sweets No   . Skips meals No   . Eats excessive starches No   . Snacks or grazes No   . Emotional eater No   . Eats fried food No   . Eats fast food No   . Diet Center Yes   . Doylene Bode No   . [place] Weight Loss No   . Nutri-System No   . Opti-Fast / Medi-Fast Yes   . Overeaters Anonymous No   . [facility] Yes   . TOPS No   . Weight Watchers Yes   . Atkins Yes   . Binging / Purging No   . Calorie Counting Yes   . Fasting Yes   . High Protein Yes   . Low Carb Yes   . Low Fat No   . [facility] Diet No   . Slim Fast Yes   . [facility] No   . Stationary cycle or treadmill Yes   . Gym/fitness Classes Yes   . Home exercise/video Yes   . Swimming Yes   . Weight training Yes   . Walking or running Yes   . Hospitalization No   . Hypnosis No   . Physical therapy Yes   . Psychological therapy Yes   . Residential program No   . Acutrim No   . Byetta No   . Contrave No   . Dexatrim Yes   . Diethylpropion No   . Fastin No   . Fen - Phen Yes   . Ionamin /  Adipex No   . Phentermine Yes   . Qsymia No   . Prozac Yes   . Saxenda No   . Topamax No   .  Wellbutrin No   . Xenical (Orlistat, Alli) No   . Other Med No   . No impairment Yes   . Walks with cane/crutch Yes   . Requires a wheelchair No   . Bedridden No   . Are you currently being treated for depression? No   . Do you snore? No   . Are you receiving any medical or psychological services? No   . Do you ever wake up at night gasping for breath? No   . Do you have or have you been treated for an eating disorder? No   . Anyone ever told you that you stop breathing while asleep? No   . Do you exercise regularly? Yes   . Have you or family member ever have trouble with anesthesia? Yes   Social History Narrative   . Not on file     Social Determinants of Health     Financial Resource Strain:    . Difficulty of Paying Living Expenses: Not on file   Food Insecurity:    . Worried About Programme researcher, broadcasting/film/video in the Last Year: Not on file   . Ran Out of Food in the Last Year: Not on file   Transportation Needs:    . Lack of Transportation (Medical): Not on file   . Lack of Transportation (Non-Medical): Not on file   Physical Activity:    . Days of Exercise per Week: Not on file   . Minutes of Exercise per Session: Not on file   Stress:    . Feeling of Stress : Not on file   Social Connections:    . Frequency of Communication with Friends and Family: Not on file   . Frequency of Social Gatherings with Friends and Family: Not on file   . Attends Religious Services: Not on file   . Active Member of Clubs or Organizations: Not on file   . Attends Banker Meetings: Not on file   . Marital Status: Not on file   Intimate Partner Violence:    . Fear of Current or Ex-Partner: Not on file   . Emotionally Abused: Not on file   . Physically Abused: Not on file   . Sexually Abused: Not on file   Housing Stability:    . Unable to Pay for Housing in the Last Year: Not on file   . Number of Places Lived in the Last Year: Not on  file   . Unstable Housing in the Last Year: Not on file     The following portions of the patient's history were reviewed and updated as appropriate: allergies, current medications, past family history, past medical history, past social history, past surgical history and problem list.    Review of Systems   Constitutional: Negative for chills and fever.   Musculoskeletal: Positive for arthralgias and myalgias. Negative for joint swelling and neck stiffness.   Skin: Negative.         Objective:   BP 129/79 (BP Site: Left arm, Patient Position: Sitting, Cuff Size: Medium)   Pulse (!) 58   Temp 97.6 F (36.4 C) (Temporal)   Resp 14   Wt 63.8 kg (140 lb 9.6 oz)   LMP  (LMP Unknown)   BMI 25.31 kg/m   Wt Readings from Last 3 Encounters:   11/09/19 63.8 kg (140 lb 9.6 oz)   10/11/19 62.6 kg (138 lb)   09/01/19 63.9 kg (140 lb 12.8 oz)  Physical Exam  Vitals reviewed.   Constitutional:       General: She is not in acute distress.     Appearance: She is not ill-appearing.   Musculoskeletal:      Right shoulder: Tenderness and bony tenderness present. No swelling, deformity, effusion or crepitus. Decreased range of motion. Decreased strength.      Right upper arm: Tenderness and bony tenderness present. No swelling, edema or deformity.      Cervical back: No swelling, deformity, rigidity, spasms, tenderness or bony tenderness. Normal range of motion.      Comments: Left shoulder  No gross deformity, asymmetry, or edema  Scapular winging: no  Full ROM ext rot, int rot, flex, ext  No tenderness of the deltoid, trap, proximal biceps tendon, or AC joint  Neg neer  Negative hawkins  Neg speeds test  Strong external rotation MS ,Normal empty can test    Right shoulder  No gross deformity, asymmetry, or edema  Scapular winging: no  Reduced ROM in flexion about 10 degrees reduced compared with contralateral side  There is reduced extension about 5 degrees  There is reduced ext rot and int rotation about 5-10 degrees    THere is tenderness of the deltoid with mild palpation, There is tenderness of trap with mild palpation,   There is tenderness of proximal biceps tendon and AC joint  Positive neer, although may be equivocal due to overall discomfort with any movement  Positive hawkins - although may be equivocal due to overall discomfort with any movement  Neg speeds test  weak external rotation MS    Neurological:      Mental Status: She is alert.            Assessment/Plan:       1. Chronic right shoulder pain  - XR Shoulder Right 2+ Views  - XR Shoulder Right Axillary View  - Ambulatory referral to Orthopedic Surgery    2. Tendinopathy of right shoulder  - XR Shoulder Right 2+ Views  - XR Shoulder Right Axillary View  - Ambulatory referral to Orthopedic Surgery    Signs and symptoms consistent with tendinopathy. May have additional intrinsic shoulder derangement. Will order x-ray and get orthopedic consultation  Pt can take ibuprofen 600 mg every 6-8 hours PRN however caution to use as little as possible and not chronically given history of gastric bypass  Activity modification to reduce pain    Next visit: due for annual physical, schedule at earliest convenience, return sooner as needed    The following activities were performed on the date of service:  preparing to see the patient: - chart review   obtaining and/or reviewing the separately obtained history  performing a medically appropriate examination and/or evaluation   counseling and educating the patient/family/caregiver  ordering medications, tests, or procedures  documenting clinical information in the electronic or other health records    Total time spent performing activities on date of service:  34  minutes          Return for Annual Physical.    Jules Husbands, DO

## 2019-11-09 NOTE — Progress Notes (Signed)
Have you seen any specialists/other providers since your last visit with us?    No    Arm preference verified?   Yes    The patient is due for AVD

## 2019-11-14 ENCOUNTER — Encounter (INDEPENDENT_AMBULATORY_CARE_PROVIDER_SITE_OTHER): Payer: Self-pay

## 2019-11-15 ENCOUNTER — Encounter (INDEPENDENT_AMBULATORY_CARE_PROVIDER_SITE_OTHER): Payer: Self-pay

## 2019-12-05 ENCOUNTER — Other Ambulatory Visit (INDEPENDENT_AMBULATORY_CARE_PROVIDER_SITE_OTHER): Payer: Self-pay | Admitting: "Endocrinology

## 2019-12-05 DIAGNOSIS — E890 Postprocedural hypothyroidism: Secondary | ICD-10-CM

## 2019-12-14 ENCOUNTER — Encounter (INDEPENDENT_AMBULATORY_CARE_PROVIDER_SITE_OTHER): Payer: Self-pay

## 2019-12-15 ENCOUNTER — Encounter (INDEPENDENT_AMBULATORY_CARE_PROVIDER_SITE_OTHER): Payer: Self-pay

## 2019-12-27 ENCOUNTER — Telehealth (INDEPENDENT_AMBULATORY_CARE_PROVIDER_SITE_OTHER): Payer: Self-pay | Admitting: "Endocrinology

## 2019-12-27 NOTE — Telephone Encounter (Signed)
FYI      RN called and spoke to pt. Pt stated she is fatigue lately, feels like something in her throat. Pt stated she would like to see Dr Rivka Safer earlier then her scheduled appointment on March, 2022. RN scheduled appointment on 12/29/2019 at 2:40 PM. Pt is getting her labs today or tomorrow.

## 2019-12-29 ENCOUNTER — Encounter (INDEPENDENT_AMBULATORY_CARE_PROVIDER_SITE_OTHER): Payer: Self-pay | Admitting: "Endocrinology

## 2019-12-29 ENCOUNTER — Telehealth (INDEPENDENT_AMBULATORY_CARE_PROVIDER_SITE_OTHER): Payer: No Typology Code available for payment source | Admitting: "Endocrinology

## 2019-12-29 VITALS — Ht 62.5 in | Wt 132.0 lb

## 2019-12-29 DIAGNOSIS — E890 Postprocedural hypothyroidism: Secondary | ICD-10-CM

## 2019-12-29 DIAGNOSIS — R9082 White matter disease, unspecified: Secondary | ICD-10-CM

## 2019-12-29 NOTE — Progress Notes (Signed)
Verbal consent has been obtained from the patient to conduct a video visit.      Chief Complaint:  Chief Complaint   Patient presents with   . Hypothyroidism       HPI:  Tracy [name] is a 54 y.o. female with postsurgical hypothyroidism, s/p sleeve gastrectomy in 2014 and revision in 2018, who returns for follow up.    Tracy [name] Tracy [name] was diagnosed with hypothyroidism after total thyroidectomy in 2013 (benign).      Patient is taking Armour thyroid 60mg  daily.   Patient is taking the following supplements: Biotin, Calcium, Multivitamin, Vitamin D 1000 IU, vitamin B12 (weekly nasal spray), seaweed supplement.    Since the last OV she had covid19 infection. Since then c/o fatigue, weakness, further weight loss. Palpitations follow episodes of fatigue.  She also c/o brain fog.  C/o increased mucus secretion (thick and yellow), growth in bone of the mandible noted on the inside of the mouth.  The increased mucus secretion is noted in 11/2019.      Labs:  Component      Latest Ref Rng & Units 07/05/2019   TSH  0.35 - 4.94 uIU/mL 4.79   T4 Free 0.70 - 1.48 ng/dL 5.40   T3 Free  9.81 - 3.71 pg/mL 2.10       Problem List:  Patient Active Problem List   Diagnosis   . Migraine   . History of hypertension   . Abdominal cyst   . Hiatal hernia   . Hypokalemia   . S/P gastric bypass   . Fatty liver   . Postsurgical hypothyroidism   . Tubular adenoma of colon   . Allergic rhinitis   . Sleep difficulties   . Difficulty concentrating   . Forgetfulness   . Hx of migraine headaches   . Occipital headache   . Frontal headache   . Hx of concussion   . White matter abnormality on MRI of brain   . Hx of sleep apnea   . Irritable bowel syndrome with diarrhea   . Chronic diarrhea       Current Medications:  Current Outpatient Medications on File Prior to Visit   Medication Sig Dispense Refill   . albuterol sulfate HFA (PROVENTIL) 108 (90 Base) MCG/ACT inhaler Inhale 2 puffs into the lungs every 4 (four) hours as needed for Wheezing  or Shortness of Breath (cough) 1 Inhaler 0   . Armour Thyroid 60 MG tablet TAKE 1 TABLET(60 MG) BY MOUTH DAILY 30 tablet 5   . Beclomethasone Dipropionate (Qnasl) 80 MCG/ACT Aero Soln 2 sprays by Nasal route daily 3 Inhaler 1   . Biotin 5 MG/ML Liquid Take by mouth daily        . butalbital-acetaminophen-caffeine (FIORICET, ESGIC) 50-325-40 MG per tablet Take 1 tablet by mouth every 4 (four) hours as needed for Headaches 10 tablet 0   . calcium carbonate 1500 (600 Ca) MG Tab tablet Take 600 mg by mouth     . calcium-vitamin D (OSCAL-500 + D) 500-200 MG-UNIT per tablet Take 1 tablet by mouth daily     . cetirizine (ZyrTEC Allergy) 10 MG tablet Take 1 tablet (10 mg total) by mouth daily 90 tablet 0   . Cyanocobalamin 500 MCG/0.1ML Solution 1 spray via nares weekly 1 Bottle 3   . diclofenac Sodium (VOLTAREN) 1 % Gel topical gel Apply 4 g to affected area 4 times daily as needed.  Max: 16 g/joint/day.  Do not apply to broken,  inflamed, or infected skin. 100 g 1   . montelukast (SINGULAIR) 10 MG tablet TAKE 1 TABLET(10 MG) BY MOUTH EVERY NIGHT 90 tablet 0   . Multiple Vitamins-Minerals (MULTIVITAMIN WITH MINERALS) tablet Take 1 tablet by mouth daily     . Spacer/Aero-Holding Chambers (AEROCHAMBER Z-STAT PLUS/MEDIUM) inhaler Use as instructed 1 each 0   . vitamin D (CHOLECALCIFEROL) 25 MCG (1000 UT) tablet Take 1,000 Units by mouth daily       No current facility-administered medications on file prior to visit.       Allergies:  Allergies   Allergen Reactions   . Bee Pollen Swelling     "Bee Stings".Marland KitchenMarland KitchenMarland KitchenStuffy nose; "throat swelling"     . Vicodin [Hydrocodone-Acetaminophen] Nausea And Vomiting       Past Medical History:  Past Medical History:   Diagnosis Date   . Anemia    . Arthritis    . Carotid stenosis    . Diarrhea 12/2016    Since I had my dual switch surgery   . Fatty liver 2017   . GERD (gastroesophageal reflux disease)    . Hepatic cirrhosis    . Hiatal hernia    . Hyperthyroidism 2013    Thyroid removed   .  Sleep apnea     not using CPAP machine   . Vitamin D deficiency        Past Surgical History:  Past Surgical History:   Procedure Laterality Date   . CHOLECYSTECTOMY  12/2016   . COLONOSCOPY, BIOPSY N/A 01/27/2018    Procedure: COLONOSCOPY, BIOPSY;  Surgeon: Shanon Brow, MD;  Location: [facility];  Service: Gastroenterology;  Laterality: N/A;   . 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 EGD, BIOPSY N/A 01/27/2018    Procedure: EGD, BIOPSY;  Surgeon: Shanon Brow, MD;  Location: [facility];  Service: Gastroenterology;  Laterality: N/A;   . GASTRECTOMY  09/2013   . HYSTERECTOMY  2015    Partial.   . LAPAROSCOPIC REVISION SLEEVE GASTRECTOMY TO GASTRIC BYPASS     . LAPAROSCOPIC, GASTRECTOMY SLEEVE     . SALPINGO OOPHERECTOMY Bilateral    . THYROIDECTOMY  2013       Family History:  Family History   Problem Relation Age of Onset   . Cancer Mother         hx ovarian   . Hypertension Mother    . Inflammatory bowel disease Mother    . Heart failure Mother    . Angina Mother    . Migraines Mother    . Asthma Father    . Diabetes Father    . Hyperlipidemia Father    . Hypertension Father    . Cancer Father         Currently has cancer   . Crohn's disease Father    . Asthma Sister    . Diabetes Sister    . Hyperlipidemia Sister    . Hypertension Sister    . Early death Sister    . Cancer Brother         hx of stomach   . Hyperlipidemia Brother    . Hypertension Brother    . Migraines Brother    . Learning disabilities Brother         Deaf   . Breast cancer Paternal Aunt         died of    . Breast cancer Other         maternal cousin--  survior   . Obesity Son        Social History:  Social History     Socioeconomic History   . Marital status: Significant Other   Tobacco Use   . Smoking status: Never Smoker   . Smokeless tobacco: Never Used   . Tobacco comment: None   Vaping Use   . Vaping Use: Never used   Substance and Sexual Activity   . Alcohol use: No   . Drug use: No   . Sexual activity: Not Currently      Partners: Male     Birth control/protection: Post-menopausal, Rhythm   Other Topics Concern   . Dietary supplements / vitamins Yes     Comment: Biotin B12 multi vitamins D3   . Anesthesia problems No   . Blood thinners No   . Pregnant No   . Future Children No   . Number of Pregnancies? No   . Number of children Yes     Comment: 1   . Miscarriages / Abortions? No   . Eats large amounts No   . Excessive Sweets No   . Skips meals No   . Eats excessive starches No   . Snacks or grazes No   . Emotional eater No   . Eats fried food No   . Eats fast food No   . Diet Center Yes   . Doylene Bode No   . [place] Weight Loss No   . Nutri-System No   . Opti-Fast / Medi-Fast Yes   . Overeaters Anonymous No   . [facility] Yes   . TOPS No   . Weight Watchers Yes   . Atkins Yes   . Binging / Purging No   . Calorie Counting Yes   . Fasting Yes   . High Protein Yes   . Low Carb Yes   . Low Fat No   . [facility] Diet No   . Slim Fast Yes   . [facility] No   . Stationary cycle or treadmill Yes   . Gym/fitness Classes Yes   . Home exercise/video Yes   . Swimming Yes   . Weight training Yes   . Walking or running Yes   . Hospitalization No   . Hypnosis No   . Physical therapy Yes   . Psychological therapy Yes   . Residential program No   . Acutrim No   . Byetta No   . Contrave No   . Dexatrim Yes   . Diethylpropion No   . Fastin No   . Fen - Phen Yes   . Ionamin / Adipex No   . Phentermine Yes   . Qsymia No   . Prozac Yes   . Saxenda No   . Topamax No   . Wellbutrin No   . Xenical (Orlistat, Alli) No   . Other Med No   . No impairment Yes   . Walks with cane/crutch Yes   . Requires a wheelchair No   . Bedridden No   . Are you currently being treated for depression? No   . Do you snore? No   . Are you receiving any medical or psychological services? No   . Do you ever wake up at night gasping for breath? No   . Do you have or have you been treated for an eating disorder? No   . Anyone ever told you that you stop breathing  while asleep?  No   . Do you exercise regularly? Yes   . Have you or family member ever have trouble with anesthesia? Yes         Visit Vitals  Ht 1.588 m (5' 2.5")   Wt 59.9 kg (132 lb)   LMP  (LMP Unknown)   BMI 23.76 kg/m        BP Readings from Last 3 Encounters:   11/09/19 129/79   10/11/19 163/90   09/01/19 (!) 176/101        Wt Readings from Last 4 Encounters:   12/29/19 1446 59.9 kg (132 lb)   11/09/19 0910 63.8 kg (140 lb 9.6 oz)   10/11/19 1633 62.6 kg (138 lb)   09/01/19 1549 63.9 kg (140 lb 12.8 oz)        Physical Exam:  GENERAL APPEARANCE: alert, in no acute distress, well developed, well nourished  HEENT: no proptosis, no scleral icterus, no conjunctival erythema  PSYCHIATRIC: normal mood, appropriate affect      Assessment/Plan:  Dailyn Reith is a 54 y.o. female with    1. Postsurgical hypothyroidism    2. White matter abnormality on MRI of brain        1. Postoperative hypothyroidism   - Continue Armour 60mg  daily  - Check TFTs due now and adjust Armour as needed    2. White matter abnormality on MRI brain last MRI 09/2018 was told to repeat every year  - Referral to neurosurgery      Orders Placed This Encounter   . T3, free   . T4, free   . TSH   . Vitamin D,25 OH, Total   . Comprehensive metabolic panel   . Insulin-like growth factor   . Ambulatory referral to Neurosurgery        There are no discontinued medications.     Return in about 6 months (around 06/28/2020).    Zerita Boers, MD

## 2020-01-14 ENCOUNTER — Encounter (INDEPENDENT_AMBULATORY_CARE_PROVIDER_SITE_OTHER): Payer: Self-pay

## 2020-01-15 ENCOUNTER — Encounter (INDEPENDENT_AMBULATORY_CARE_PROVIDER_SITE_OTHER): Payer: Self-pay

## 2020-02-14 ENCOUNTER — Encounter (INDEPENDENT_AMBULATORY_CARE_PROVIDER_SITE_OTHER): Payer: Self-pay

## 2020-02-15 ENCOUNTER — Encounter (INDEPENDENT_AMBULATORY_CARE_PROVIDER_SITE_OTHER): Payer: Self-pay

## 2020-03-05 ENCOUNTER — Other Ambulatory Visit (INDEPENDENT_AMBULATORY_CARE_PROVIDER_SITE_OTHER): Payer: Self-pay

## 2020-03-05 NOTE — Progress Notes (Signed)
03/05/20 1535   Pt Outreach/Care Plan Metrics   Payor Grouping for [employer]   Care Plan Progress Refused   Outreach Status for Metrics listing Spoke with Member/Care Giver  (Spoke with patient)     Nurse Navigator spoke with patient today. Introduced the care management program explaining services and support. Patient gracefully refused participation. However, patient was provided with Nurse Navigator contact information and asked to call for any future care management needs and support.       Arva Chafe., RN, BSN, CDCES, ACM-RN   Patient Care Nurse Navigator  Ambulatory Care Management   T [phone]

## 2020-03-07 ENCOUNTER — Encounter (INDEPENDENT_AMBULATORY_CARE_PROVIDER_SITE_OTHER): Payer: Self-pay | Admitting: "Endocrinology

## 2020-03-07 ENCOUNTER — Telehealth (INDEPENDENT_AMBULATORY_CARE_PROVIDER_SITE_OTHER): Payer: No Typology Code available for payment source | Admitting: "Endocrinology

## 2020-03-07 VITALS — Ht 62.5 in | Wt 133.0 lb

## 2020-03-07 DIAGNOSIS — R9082 White matter disease, unspecified: Secondary | ICD-10-CM

## 2020-03-07 DIAGNOSIS — E890 Postprocedural hypothyroidism: Secondary | ICD-10-CM

## 2020-03-07 DIAGNOSIS — R519 Headache, unspecified: Secondary | ICD-10-CM

## 2020-03-07 DIAGNOSIS — R0789 Other chest pain: Secondary | ICD-10-CM

## 2020-03-07 NOTE — Progress Notes (Signed)
Chief Complaint:  Chief Complaint   Patient presents with   . Hypothyroidism       HPI:  Tracy [name] is a 55 y.o. female with postsurgical hypothyroidism, s/p sleeve gastrectomy (2014, revision in 2018), who returns for follow up.    Tracy [name] Tracy [name] was diagnosed with hypothyroidism after total thyroidectomy in 2013 (benign).      Patient is taking Armour thyroid 60mg  daily.   Patient is taking the following supplements: Biotin, Calcium, Multivitamin, Vitamin D3 1000 IU, vitamin B12 (weekly nasal spray), seaweed supplement.    She reports chest wall discomfort on the left side, cough.  She has chronic headaches and would like to follow up with neurology on abnormal MRI findings in the past.      Labs:  Component      Latest Ref Rng & Units 07/05/2019   TSH  0.35 - 4.94 uIU/mL 4.79   T4 Free 0.70 - 1.48 ng/dL 1.61   T3 Free  0.96 - 3.71 pg/mL 2.10       Problem List:  Patient Active Problem List   Diagnosis   . Migraine   . History of hypertension   . Abdominal cyst   . Hiatal hernia   . Hypokalemia   . S/P gastric bypass   . Fatty liver   . Postsurgical hypothyroidism   . Tubular adenoma of colon   . Allergic rhinitis   . Sleep difficulties   . Difficulty concentrating   . Forgetfulness   . Hx of migraine headaches   . Occipital headache   . Frontal headache   . Hx of concussion   . White matter abnormality on MRI of brain   . Hx of sleep apnea   . Irritable bowel syndrome with diarrhea   . Chronic diarrhea       Current Medications:  Current Outpatient Medications on File Prior to Visit   Medication Sig Dispense Refill   . Armour Thyroid 60 MG tablet TAKE 1 TABLET(60 MG) BY MOUTH DAILY 30 tablet 5   . Beclomethasone Dipropionate (Qnasl) 80 MCG/ACT Aero Soln 2 sprays by Nasal route daily 3 Inhaler 1   . Biotin 5 MG/ML Liquid Take by mouth daily        . butalbital-acetaminophen-caffeine (FIORICET, ESGIC) 50-325-40 MG per tablet Take 1 tablet by mouth every 4 (four) hours as needed for Headaches 10 tablet 0    . calcium carbonate 1500 (600 Ca) MG Tab tablet Take 600 mg by mouth     . calcium-vitamin D (OSCAL-500 + D) 500-200 MG-UNIT per tablet Take 1 tablet by mouth daily     . cetirizine (ZyrTEC Allergy) 10 MG tablet Take 1 tablet (10 mg total) by mouth daily 90 tablet 0   . Cyanocobalamin 500 MCG/0.1ML Solution 1 spray via nares weekly 1 Bottle 3   . diclofenac Sodium (VOLTAREN) 1 % Gel topical gel Apply 4 g to affected area 4 times daily as needed.  Max: 16 g/joint/day.  Do not apply to broken, inflamed, or infected skin. 100 g 1   . montelukast (SINGULAIR) 10 MG tablet TAKE 1 TABLET(10 MG) BY MOUTH EVERY NIGHT 90 tablet 0   . Multiple Vitamins-Minerals (MULTIVITAMIN WITH MINERALS) tablet Take 1 tablet by mouth daily     . Spacer/Aero-Holding Chambers (AEROCHAMBER Z-STAT PLUS/MEDIUM) inhaler Use as instructed 1 each 0   . vitamin D (CHOLECALCIFEROL) 25 MCG (1000 UT) tablet Take 1,000 Units by mouth daily     . albuterol  sulfate HFA (PROVENTIL) 108 (90 Base) MCG/ACT inhaler Inhale 2 puffs into the lungs every 4 (four) hours as needed for Wheezing or Shortness of Breath (cough) 1 Inhaler 0     No current facility-administered medications on file prior to visit.       Allergies:  Allergies   Allergen Reactions   . Bee Pollen Swelling     "Bee Stings".Marland KitchenMarland KitchenMarland KitchenStuffy nose; "throat swelling"     . Vicodin [Hydrocodone-Acetaminophen] Nausea And Vomiting       Past Medical History:  Past Medical History:   Diagnosis Date   . Anemia    . Arthritis    . Carotid stenosis    . Diarrhea 12/2016    Since I had my dual switch surgery   . Fatty liver 2017   . GERD (gastroesophageal reflux disease)    . Hepatic cirrhosis    . Hiatal hernia    . Hyperthyroidism 2013    Thyroid removed   . Sleep apnea     not using CPAP machine   . Vitamin D deficiency        Past Surgical History:  Past Surgical History:   Procedure Laterality Date   . CHOLECYSTECTOMY  12/2016   . COLONOSCOPY, BIOPSY N/A 01/27/2018    Procedure: COLONOSCOPY, BIOPSY;   Surgeon: Shanon Brow, MD;  Location: [facility];  Service: Gastroenterology;  Laterality: N/A;   . 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 EGD, BIOPSY N/A 01/27/2018    Procedure: EGD, BIOPSY;  Surgeon: Shanon Brow, MD;  Location: [facility];  Service: Gastroenterology;  Laterality: N/A;   . GASTRECTOMY  09/2013   . HYSTERECTOMY  2015    Partial.   . LAPAROSCOPIC REVISION SLEEVE GASTRECTOMY TO GASTRIC BYPASS     . LAPAROSCOPIC, GASTRECTOMY SLEEVE     . SALPINGO OOPHERECTOMY Bilateral    . THYROIDECTOMY  2013       Family History:  Family History   Problem Relation Age of Onset   . Cancer Mother         hx ovarian   . Hypertension Mother    . Inflammatory bowel disease Mother    . Heart failure Mother    . Angina Mother    . Migraines Mother    . Asthma Father    . Diabetes Father    . Hyperlipidemia Father    . Hypertension Father    . Cancer Father         Currently has cancer   . Crohn's disease Father    . Asthma Sister    . Diabetes Sister    . Hyperlipidemia Sister    . Hypertension Sister    . Early death Sister    . Cancer Brother         hx of stomach   . Hyperlipidemia Brother    . Hypertension Brother    . Migraines Brother    . Learning disabilities Brother         Deaf   . Breast cancer Paternal Aunt         died of    . Breast cancer Other         maternal cousin-- survior   . Obesity Son        Social History:  Social History     Socioeconomic History   . Marital status: Significant Other   Tobacco Use   . Smoking status: Never Smoker   . Smokeless tobacco:  Never Used   . Tobacco comment: None   Vaping Use   . Vaping Use: Never used   Substance and Sexual Activity   . Alcohol use: No   . Drug use: No   . Sexual activity: Not Currently     Partners: Male     Birth control/protection: Post-menopausal, Rhythm   Other Topics Concern   . Dietary supplements / vitamins Yes     Comment: Biotin B12 multi vitamins D3   . Anesthesia problems No   . Blood thinners No   . Pregnant No   . Future  Children No   . Number of Pregnancies? No   . Number of children Yes     Comment: 1   . Miscarriages / Abortions? No   . Eats large amounts No   . Excessive Sweets No   . Skips meals No   . Eats excessive starches No   . Snacks or grazes No   . Emotional eater No   . Eats fried food No   . Eats fast food No   . Diet Center Yes   . Doylene Bode No   . LA Weight Loss No   . Nutri-System No   . Opti-Fast / Medi-Fast Yes   . Overeaters Anonymous No   . [facility] Yes   . TOPS No   . Weight Watchers Yes   . Atkins Yes   . Binging / Purging No   . Calorie Counting Yes   . Fasting Yes   . High Protein Yes   . Low Carb Yes   . Low Fat No   . [facility] Diet No   . Slim Fast Yes   . [facility] No   . Stationary cycle or treadmill Yes   . Gym/fitness Classes Yes   . Home exercise/video Yes   . Swimming Yes   . Weight training Yes   . Walking or running Yes   . Hospitalization No   . Hypnosis No   . Physical therapy Yes   . Psychological therapy Yes   . Residential program No   . Acutrim No   . Byetta No   . Contrave No   . Dexatrim Yes   . Diethylpropion No   . Fastin No   . Fen - Phen Yes   . Ionamin / Adipex No   . Phentermine Yes   . Qsymia No   . Prozac Yes   . Saxenda No   . Topamax No   . Wellbutrin No   . Xenical (Orlistat, Alli) No   . Other Med No   . No impairment Yes   . Walks with cane/crutch Yes   . Requires a wheelchair No   . Bedridden No   . Are you currently being treated for depression? No   . Do you snore? No   . Are you receiving any medical or psychological services? No   . Do you ever wake up at night gasping for breath? No   . Do you have or have you been treated for an eating disorder? No   . Anyone ever told you that you stop breathing while asleep? No   . Do you exercise regularly? Yes   . Have you or family member ever have trouble with anesthesia? Yes         Visit Vitals  Ht 1.588 m (5' 2.5")   Wt 60.3 kg (133 lb)  LMP  (LMP Unknown)   BMI 23.94 kg/m        BP Readings from  Last 3 Encounters:   11/09/19 129/79   10/11/19 163/90   09/01/19 (!) 176/101        Wt Readings from Last 4 Encounters:   03/07/20 1547 60.3 kg (133 lb)   12/29/19 1446 59.9 kg (132 lb)   11/09/19 0910 63.8 kg (140 lb 9.6 oz)   10/11/19 1633 62.6 kg (138 lb)        Physical Exam:  GENERAL APPEARANCE: alert, in no acute distress, well developed, well nourished  PSYCHIATRIC: normal mood, appropriate affect      Assessment/Plan:  Tracy [name] is a 55 y.o. female with    1. Postsurgical hypothyroidism    2. Nonintractable headache, unspecified chronicity pattern, unspecified headache type    3. White matter abnormality on MRI of brain    4. Chest discomfort    5. H/O thyroidectomy        1. Postoperative hypothyroidism   - Continue Armour 60mg  daily  - Check TFTs and adjust Armour as needed    2. White matter abnormality on MRI brain/ headaches  - Referral to neurology    3. Chest discomfort Patient reports it's similar to when she had a goiter and would like to have an ultrasound to see whether the goiter has grown back  - Check thyroid ultrasound  - Advised to see PCP regarding symptoms and have a thorough cardiac and pulmonary evaluation as well      Orders Placed This Encounter   . [place] Head Neck Soft Tissue   . Ambulatory referral to Neurology        There are no discontinued medications.     Return in about 4 months (around 07/07/2020).    Zerita Boers, MD

## 2020-03-12 ENCOUNTER — Telehealth (INDEPENDENT_AMBULATORY_CARE_PROVIDER_SITE_OTHER): Payer: Self-pay | Admitting: Family Medicine

## 2020-03-12 NOTE — Telephone Encounter (Signed)
Pt calling in complaining of chest pain and headaches. Requesting to speak to nurse. Please advise

## 2020-03-12 NOTE — Telephone Encounter (Signed)
Left VM for pt to return call to discuss sx. She recently seen Endo specialist and mentioned a chest discomfort she has been feeling. Appt scheduled with provider for next week.

## 2020-03-14 ENCOUNTER — Encounter (INDEPENDENT_AMBULATORY_CARE_PROVIDER_SITE_OTHER): Payer: Self-pay

## 2020-03-21 ENCOUNTER — Ambulatory Visit (INDEPENDENT_AMBULATORY_CARE_PROVIDER_SITE_OTHER): Payer: No Typology Code available for payment source | Admitting: Family Medicine

## 2020-03-21 ENCOUNTER — Telehealth (INDEPENDENT_AMBULATORY_CARE_PROVIDER_SITE_OTHER): Payer: Self-pay | Admitting: Family Medicine

## 2020-03-21 ENCOUNTER — Encounter (INDEPENDENT_AMBULATORY_CARE_PROVIDER_SITE_OTHER): Payer: Self-pay | Admitting: Family Medicine

## 2020-03-22 ENCOUNTER — Ambulatory Visit (INDEPENDENT_AMBULATORY_CARE_PROVIDER_SITE_OTHER): Payer: No Typology Code available for payment source | Admitting: Family Medicine

## 2020-03-22 ENCOUNTER — Encounter (INDEPENDENT_AMBULATORY_CARE_PROVIDER_SITE_OTHER): Payer: Self-pay | Admitting: Family Medicine

## 2020-03-22 ENCOUNTER — Ambulatory Visit
Admission: RE | Admit: 2020-03-22 | Discharge: 2020-03-22 | Disposition: A | Discharge status: Home / Self Care | Payer: No Typology Code available for payment source | Source: Ambulatory Visit | Attending: "Endocrinology | Admitting: "Endocrinology

## 2020-03-22 VITALS — BP 150/83 | HR 50 | Temp 97.3°F | Resp 12 | Ht 61.5 in | Wt 133.0 lb

## 2020-03-22 DIAGNOSIS — R0789 Other chest pain: Secondary | ICD-10-CM | POA: Insufficient documentation

## 2020-03-22 DIAGNOSIS — J019 Acute sinusitis, unspecified: Secondary | ICD-10-CM

## 2020-03-22 DIAGNOSIS — G43809 Other migraine, not intractable, without status migrainosus: Secondary | ICD-10-CM

## 2020-03-22 DIAGNOSIS — E890 Postprocedural hypothyroidism: Secondary | ICD-10-CM | POA: Insufficient documentation

## 2020-03-22 MED ORDER — AMOXICILLIN-POT CLAVULANATE 875-125 MG PO TABS
1.0000 | ORAL_TABLET | Freq: Two times a day (BID) | ORAL | 0 refills | Status: AC
Start: 2020-03-22 — End: 2020-04-01

## 2020-03-22 NOTE — Progress Notes (Signed)
Have you seen any specialists/other providers since your last visit with [place]?    Dr. Rivka Safer for thyroid    Arm preference verified?   yes    The patient is due for   Pap 2016 (Partial hysterectomy)  Annual 2021

## 2020-03-22 NOTE — Progress Notes (Signed)
Subjective:    Date: 03/22/2020 8:50 AM   Patient ID: Tracy [name] is a 55 y.o. female.    Chief Complaint   Patient presents with   . Headache     C/o headaches has gotten worse x 3-4 months, having difficulty forming words x 2017, sinus pain and pressure, green discharge from eyes x 2-3wks.     HPI  + nasal congestion, dry cough, sinus pressure. States chills here and there.  Denies fever.   Denies gum pain or toothache.   Denies shortness of breath  Denies abdominal pain/diarrhea/nausea/vomiting.     No problems updated.    Patient Active Problem List   Diagnosis   . Migraine   . History of hypertension   . Abdominal cyst   . Hiatal hernia   . Hypokalemia   . S/P gastric bypass   . Fatty liver   . Postsurgical hypothyroidism   . Tubular adenoma of colon   . Allergic rhinitis   . Sleep difficulties   . Difficulty concentrating   . Forgetfulness   . Hx of migraine headaches   . Occipital headache   . Frontal headache   . Hx of concussion   . White matter abnormality on MRI of brain   . Hx of sleep apnea   . Irritable bowel syndrome with diarrhea   . Chronic diarrhea     Patient Care Team:  Jules Husbands, DO as PCP - General (Family Medicine)  Einar Grad, MD as Consulting Physician (Gastroenterology)  Zerita Boers, MD as Consulting Physician (Endocrinology, Diabetes and Metabolism)  Campbell Lerner, MD as Consulting Physician (Neurology)    Immunization History   Administered Date(s) Administered   . COVID-19 mRNA vaccine 12 years and above [employer] Cap) 30 mcg/0.3 mL 04/16/2019, 05/14/2019   . Pneumococcal 23 valent 07/14/2019   . Tdap 01/07/2011   . Zoster [facility]) Vaccine Recombinant 07/14/2019     Current Outpatient Medications   Medication Sig Dispense Refill   . albuterol sulfate HFA (PROVENTIL) 108 (90 Base) MCG/ACT inhaler Inhale 2 puffs into the lungs every 4 (four) hours as needed for Wheezing or Shortness of Breath (cough) 1 Inhaler 0   . Armour Thyroid 60 MG tablet TAKE 1 TABLET(60 MG)  BY MOUTH DAILY 30 tablet 5   . Beclomethasone Dipropionate (Qnasl) 80 MCG/ACT Aero Soln 2 sprays by Nasal route daily 3 Inhaler 1   . Biotin 5 MG/ML Liquid Take by mouth daily        . butalbital-acetaminophen-caffeine (FIORICET, ESGIC) 50-325-40 MG per tablet Take 1 tablet by mouth every 4 (four) hours as needed for Headaches 10 tablet 0   . calcium carbonate 1500 (600 Ca) MG Tab tablet Take 600 mg by mouth     . calcium-vitamin D (OSCAL-500 + D) 500-200 MG-UNIT per tablet Take 1 tablet by mouth daily     . cetirizine (ZyrTEC Allergy) 10 MG tablet Take 1 tablet (10 mg total) by mouth daily 90 tablet 0   . Cyanocobalamin 500 MCG/0.1ML Solution 1 spray via nares weekly 1 Bottle 3   . diclofenac Sodium (VOLTAREN) 1 % Gel topical gel Apply 4 g to affected area 4 times daily as needed.  Max: 16 g/joint/day.  Do not apply to broken, inflamed, or infected skin. 100 g 1   . montelukast (SINGULAIR) 10 MG tablet TAKE 1 TABLET(10 MG) BY MOUTH EVERY NIGHT 90 tablet 0   . Multiple Vitamins-Minerals (MULTIVITAMIN WITH MINERALS) tablet Take 1  tablet by mouth daily     . Spacer/Aero-Holding Chambers (AEROCHAMBER Z-STAT PLUS/MEDIUM) inhaler Use as instructed 1 each 0   . vitamin D (CHOLECALCIFEROL) 25 MCG (1000 UT) tablet Take 1,000 Units by mouth daily       No current facility-administered medications for this visit.     Allergies   Allergen Reactions   . Bee Pollen Swelling     "Bee Stings".Marland KitchenMarland KitchenMarland KitchenStuffy nose; "throat swelling"     . Vicodin [Hydrocodone-Acetaminophen] Nausea And Vomiting       Past Medical History:   Diagnosis Date   . Anemia    . Arthritis    . Carotid stenosis    . Diarrhea 12/2016    Since I had my dual switch surgery   . Fatty liver 2017   . GERD (gastroesophageal reflux disease)    . Hepatic cirrhosis    . Hiatal hernia    . Hyperthyroidism 2013    Thyroid removed   . Sleep apnea     not using CPAP machine   . Vitamin D deficiency      Past Surgical History:   Procedure Laterality Date   . CHOLECYSTECTOMY   12/2016   . COLONOSCOPY, BIOPSY N/A 01/27/2018    Procedure: COLONOSCOPY, BIOPSY;  Surgeon: Shanon Brow, MD;  Location: [facility];  Service: Gastroenterology;  Laterality: N/A;   . 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 EGD, BIOPSY N/A 01/27/2018    Procedure: EGD, BIOPSY;  Surgeon: Shanon Brow, MD;  Location: [facility];  Service: Gastroenterology;  Laterality: N/A;   . GASTRECTOMY  09/2013   . HYSTERECTOMY  2015    Partial.   . LAPAROSCOPIC REVISION SLEEVE GASTRECTOMY TO GASTRIC BYPASS     . LAPAROSCOPIC, GASTRECTOMY SLEEVE     . SALPINGO OOPHERECTOMY Bilateral    . THYROIDECTOMY  2013       Family History   Problem Relation Age of Onset   . Cancer Mother         hx ovarian   . Hypertension Mother    . Inflammatory bowel disease Mother    . Heart failure Mother    . Angina Mother    . Migraines Mother    . Asthma Father    . Diabetes Father    . Hyperlipidemia Father    . Hypertension Father    . Cancer Father         Currently has cancer   . Crohn's disease Father    . Asthma Sister    . Diabetes Sister    . Hyperlipidemia Sister    . Hypertension Sister    . Early death Sister    . Cancer Brother         hx of stomach   . Hyperlipidemia Brother    . Hypertension Brother    . Migraines Brother    . Learning disabilities Brother         Deaf   . Breast cancer Paternal Aunt         died of    . Breast cancer Other         maternal cousin-- survior   . Obesity Son        Social History     Socioeconomic History   . Marital status: Significant Other   Tobacco Use   . Smoking status: Never Smoker   . Smokeless tobacco: Never Used   . Tobacco comment: None   Vaping Use   .  Vaping Use: Never used   Substance and Sexual Activity   . Alcohol use: No   . Drug use: Not Currently   . Sexual activity: Not Currently     Partners: Male     Birth control/protection: Post-menopausal, Rhythm   Other Topics Concern   . Dietary supplements / vitamins Yes     Comment: Biotin B12 multi vitamins D3   . Anesthesia  problems No   . Blood thinners No   . Pregnant No   . Future Children No   . Number of Pregnancies? No   . Number of children Yes     Comment: 1   . Miscarriages / Abortions? No   . Eats large amounts No   . Excessive Sweets No   . Skips meals No   . Eats excessive starches No   . Snacks or grazes No   . Emotional eater No   . Eats fried food No   . Eats fast food No   . Diet Center Yes   . Doylene Bode No   . [facility] Weight Loss No   . Nutri-System No   . Opti-Fast / Medi-Fast Yes   . Overeaters Anonymous No   . [facility] Yes   . TOPS No   . Weight Watchers Yes   . Atkins Yes   . Binging / Purging No   . Calorie Counting Yes   . Fasting Yes   . High Protein Yes   . Low Carb Yes   . Low Fat No   . [facility] Diet No   . Slim Fast Yes   . [facility] No   . Stationary cycle or treadmill Yes   . Gym/fitness Classes Yes   . Home exercise/video Yes   . Swimming Yes   . Weight training Yes   . Walking or running Yes   . Hospitalization No   . Hypnosis No   . Physical therapy Yes   . Psychological therapy Yes   . Residential program No   . Acutrim No   . Byetta No   . Contrave No   . Dexatrim Yes   . Diethylpropion No   . Fastin No   . Fen - Phen Yes   . Ionamin / Adipex No   . Phentermine Yes   . Qsymia No   . Prozac Yes   . Saxenda No   . Topamax No   . Wellbutrin No   . Xenical (Orlistat, Alli) No   . Other Med No   . No impairment Yes   . Walks with cane/crutch Yes   . Requires a wheelchair No   . Bedridden No   . Are you currently being treated for depression? No   . Do you snore? No   . Are you receiving any medical or psychological services? No   . Do you ever wake up at night gasping for breath? No   . Do you have or have you been treated for an eating disorder? No   . Anyone ever told you that you stop breathing while asleep? No   . Do you exercise regularly? Yes   . Have you or family member ever have trouble with anesthesia? Yes     The following portions of the patient's history were reviewed  and updated as appropriate: allergies, current medications, past family history, past medical history, past social history, past surgical history and problem list.  Review of Systems     Objective:   BP 150/83   Pulse (!) 50   Temp 97.3 F (36.3 C)   Resp 12   Ht 1.562 m (5' 1.5")   Wt 60.3 kg (133 lb)   LMP  (LMP Unknown)   BMI 24.72 kg/m   Wt Readings from Last 3 Encounters:   03/22/20 60.3 kg (133 lb)   03/07/20 60.3 kg (133 lb)   12/29/19 59.9 kg (132 lb)     Physical Exam  Vitals and nursing note reviewed.   Constitutional:       General: She is not in acute distress.  HENT:      Right Ear: Tympanic membrane normal.      Left Ear: Tympanic membrane normal.      Nose:      Right Turbinates: Enlarged.      Left Turbinates: Enlarged.      Right Sinus: Maxillary sinus tenderness and frontal sinus tenderness present.      Left Sinus: Maxillary sinus tenderness and frontal sinus tenderness present.      Mouth/Throat:      Mouth: Mucous membranes are moist.      Pharynx: Oropharynx is clear. Uvula midline. No pharyngeal swelling, oropharyngeal exudate, posterior oropharyngeal erythema or uvula swelling.      Tonsils: No tonsillar exudate.   Eyes:      Conjunctiva/sclera: Conjunctivae normal.   Cardiovascular:      Rate and Rhythm: Normal rate and regular rhythm.      Heart sounds: S1 normal and S2 normal.   Pulmonary:      Effort: Pulmonary effort is normal.      Breath sounds: Normal breath sounds. No decreased breath sounds, wheezing, rhonchi or rales.   Chest:   Breasts:      Right: No supraclavicular adenopathy.      Left: No supraclavicular adenopathy.       Musculoskeletal:      Cervical back: Neck supple.   Lymphadenopathy:      Head:      Right side of head: No submental, submandibular or tonsillar adenopathy.      Left side of head: No submental, submandibular or tonsillar adenopathy.      Cervical: No cervical adenopathy.      Upper Body:      Right upper body: No supraclavicular adenopathy.       Left upper body: No supraclavicular adenopathy.   Skin:     Findings: No rash.            Assessment/Plan:       1. Other migraine without status migrainosus, not intractable  - Ambulatory referral to Neurology    2. Acute sinusitis, recurrence not specified, unspecified location  - amoxicillin-clavulanate (AUGMENTIN) 875-125 MG per tablet; Take 1 tablet by mouth in the morning and 1 tablet in the evening. Do all this for 10 days.  Dispense: 20 tablet; Refill: 0    Status: waxing and waning  Treatment recommendations: optimize H20 hydration and OTC Mucinex twice a day as neededDue to persistent symptoms and typical physical exam findings, recommend antibiotic therapy. Complete entire course of antibiotic therapy as directed.    Increase oral hydration and ensure adequate nutrition and rest  Signs and symptoms that warrant return/urgent/emergent evaluation discussed   Mucinex PRN congestion/cough  Tylenol PRN pain/myalgia  Vitamin D/Zinc/Vitamin C for supplement support  Encouraged continue regular handwashing, masking, and social distancing     Return for Annual Physical,  Return sooner as needed.    Marline Backbone, DO

## 2020-03-23 ENCOUNTER — Emergency Department
Admission: EM | Admit: 2020-03-23 | Discharge: 2020-03-23 | Disposition: A | Discharge status: Home / Self Care | Payer: No Typology Code available for payment source | Attending: Emergency Medicine | Admitting: Emergency Medicine

## 2020-03-23 ENCOUNTER — Emergency Department: Payer: No Typology Code available for payment source

## 2020-03-23 DIAGNOSIS — R519 Headache, unspecified: Secondary | ICD-10-CM | POA: Insufficient documentation

## 2020-03-23 DIAGNOSIS — I7789 Other specified disorders of arteries and arterioles: Secondary | ICD-10-CM | POA: Insufficient documentation

## 2020-03-23 LAB — CBC AND DIFFERENTIAL
Absolute NRBC: 0 10*3/uL (ref 0.00–0.00)
Basophils Absolute Automated: 0.03 10*3/uL (ref 0.00–0.08)
Basophils Automated: 0.8 %
Eosinophils Absolute Automated: 0.14 10*3/uL (ref 0.00–0.44)
Eosinophils Automated: 3.6 %
Hematocrit: 31.8 % — ABNORMAL LOW (ref 34.7–43.7)
Hgb: 9.9 g/dL — ABNORMAL LOW (ref 11.4–14.8)
Immature Granulocytes Absolute: 0.01 10*3/uL (ref 0.00–0.07)
Immature Granulocytes: 0.3 %
Lymphocytes Absolute Automated: 1.47 10*3/uL (ref 0.42–3.22)
Lymphocytes Automated: 38 %
MCH: 28.4 pg (ref 25.1–33.5)
MCHC: 31.1 g/dL — ABNORMAL LOW (ref 31.5–35.8)
MCV: 91.1 fL (ref 78.0–96.0)
MPV: 11.7 fL (ref 8.9–12.5)
Monocytes Absolute Automated: 0.33 10*3/uL (ref 0.21–0.85)
Monocytes: 8.5 %
Neutrophils Absolute: 1.89 10*3/uL (ref 1.10–6.33)
Neutrophils: 48.8 %
Nucleated RBC: 0 /100 WBC (ref 0.0–0.0)
Platelets: 207 10*3/uL (ref 142–346)
RBC: 3.49 10*6/uL — ABNORMAL LOW (ref 3.90–5.10)
RDW: 15 % (ref 11–15)
WBC: 3.87 10*3/uL (ref 3.10–9.50)

## 2020-03-23 LAB — BASIC METABOLIC PANEL
Anion Gap: 7 (ref 5.0–15.0)
BUN: 14 mg/dL (ref 7.0–19.0)
CO2: 24 mEq/L (ref 22–29)
Calcium: 8.7 mg/dL (ref 8.5–10.5)
Chloride: 110 mEq/L (ref 100–111)
Creatinine: 0.8 mg/dL (ref 0.6–1.0)
Glucose: 84 mg/dL (ref 70–100)
Potassium: 3.8 mEq/L (ref 3.5–5.1)
Sodium: 141 mEq/L (ref 136–145)

## 2020-03-23 LAB — GFR: EGFR: >60

## 2020-03-23 MED ORDER — IOHEXOL 350 MG/ML IV SOLN
75.0000 mL | Freq: Once | INTRAVENOUS | Status: AC | PRN
Start: 2020-03-23 — End: 2020-03-23
  Administered 2020-03-23: 75 mL via INTRAVENOUS

## 2020-03-23 MED ORDER — SODIUM CHLORIDE 0.9 % IV BOLUS
1000.0000 mL | Freq: Once | INTRAVENOUS | Status: DC
Start: 2020-03-23 — End: 2020-03-24

## 2020-03-23 MED ORDER — BUTALBITAL-APAP-CAFFEINE 50-325-40 MG PO TABS
1.0000 | ORAL_TABLET | Freq: Once | ORAL | Status: AC
Start: 2020-03-23 — End: 2020-03-23
  Administered 2020-03-23: 23:00:00 1 via ORAL
  Filled 2020-03-23: qty 1

## 2020-03-23 MED ORDER — METOCLOPRAMIDE HCL 5 MG/ML IJ SOLN
10.0000 mg | Freq: Once | INTRAMUSCULAR | Status: AC
Start: 2020-03-23 — End: 2020-03-23
  Administered 2020-03-23: 23:00:00 10 mg via INTRAVENOUS
  Filled 2020-03-23: qty 2

## 2020-03-23 MED ORDER — BUTALBITAL-APAP-CAFFEINE 50-325-40 MG PO TABS
1.0000 | ORAL_TABLET | ORAL | 0 refills | Status: AC | PRN
Start: 2020-03-23 — End: ?

## 2020-03-23 MED ORDER — DIPHENHYDRAMINE HCL 50 MG/ML IJ SOLN
25.0000 mg | Freq: Once | INTRAMUSCULAR | Status: DC
Start: 2020-03-23 — End: 2020-03-24
  Filled 2020-03-23: qty 1

## 2020-03-23 NOTE — ED Provider Notes (Addendum)
ATTENDING NOTE    I saw and evaluated the patient. Discussed with resident/ midlevel and agree with the resident's/ midlevel's findings and plan as documented in the resident's note.    CC: Tracy [name] is a 55 y.o. female c/o headache and blurry vision. Pt has hx of carotid abnormality for which she sees a specialist based in [place]. Pt's neurologist is in [place] area; she has not been taking any medications for this.     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 Nonintractable headache, unspecified chronicity pattern, unspecified headache type        Petra Kuba, MD  03/24/20 1525       Shara Blazing, MD  03/28/20 [phone]

## 2020-03-23 NOTE — Discharge Instructions (Addendum)
Dear Ms. Manson Passey:    Thank you for choosing the [facility] Emergency Department, the premier emergency department in the [place] area.  I hope your visit today was EXCELLENT. You will receive a survey via text message that will give you the opportunity to provide feedback to your team about your visit. Please do not hesitate to reach out with any questions!    Specific instructions for your visit today:    Continue to take Motrin or Tylenol for pain as needed.  For severe pain try Fioricet.  Please follow-up with vascular specialist for further evaluation.  Return immediately if worsening pain, vision changes, neck pain, fever or if any other concerns or questions.      IF YOU DO NOT CONTINUE TO IMPROVE OR YOUR CONDITION WORSENS, PLEASE CONTACT YOUR DOCTOR OR RETURN IMMEDIATELY TO THE EMERGENCY DEPARTMENT.    Sincerely,  Shara Blazing, MD  Attending Emergency Physician  [facility] Emergency Department      OBTAINING A PRIMARY CARE APPOINTMENT    Primary care physicians (PCPs, also known as primary care doctors) are either internists or family medicine doctors. Both types of PCPs focus on health promotion, disease prevention, patient education and counseling, and treatment of acute and chronic medical conditions.    If you need a primary care doctor, please call the below number and ask who is receiving new patients.     [facility]  Telephone:  [phone]  [email or web address]    DOCTOR REFERRALS  Call [phone] (available 24 hours a day, 7 days a week) if you need any further referrals and we can help you find a primary care doctor or specialist.  Also, available online at:  [email or web address]    YOUR CONTACT INFORMATION  Before leaving please check with registration to make sure we have an up-to-date contact number.  You can call registration at [phone] to update your information.  For questions about your hospital bill, please call [phone].  For  questions about your Emergency Dept Physician bill please call [phone].      FREE HEALTH SERVICES  If you need help with health or social services, please call 2-1-1 for a free referral to resources in your area.  2-1-1 is a free service connecting people with information on health insurance, free clinics, pregnancy, mental health, dental care, food assistance, housing, and substance abuse counseling.  Also, available online at:  http://www.[facility].org    ORTHOPEDIC INJURY   Please know that significant injuries can exist even when an initial x-ray is read as normal or negative.  This can occur because some fractures (broken bones) are not initially visible on x-rays.  For this reason, close outpatient follow-up with your primary care doctor or bone specialist (orthopedist) is required.    MEDICATIONS AND FOLLOWUP  Please be aware that some prescription medications can cause drowsiness.  Use caution when driving or operating machinery.    The examination and treatment you have received in our Emergency Department is provided on an emergency basis, and is not intended to be a substitute for your primary care physician.  It is important that your doctor checks you again and that you report any new or remaining problems at that time.      24 HOUR PHARMACIES  The nearest 24 hour pharmacy is:    [facility]  [address]  [place], [place] [address]  [phone]      ASSISTANCE WITH INSURANCE    Affordable Care  Act  (ACA)  Call to start or finish an application, compare plans, enroll or ask a question.  [place]: [phone]  Web:  Healthcare.gov    Help Enrolling in [place]  [phone] (TOLL-FREE)  (731)[phone] (TTY)  Web:  Http://www.coverva.org    Local Help Enrolling in the [place]  269-211-3114 (MAIN)  Email:  health-help@nvfs .org  Web:  http://lewis-perez.info/  Address:  [address] [place], [place]  [address]    SEDATING MEDICATIONS  Sedating medications include strong pain medications (e.g. narcotics), muscle relaxers, benzodiazepines (used for anxiety and as muscle relaxers), Benadryl/diphenhydramine and other antihistamines for allergic reactions/itching, and other medications.  If you are unsure if you have received a sedating medication, please ask your physician or nurse.  If you received a sedating medication: DO NOT drive a car. DO NOT operate machinery. DO NOT perform jobs where you need to be alert.  DO NOT drink alcoholic beverages while taking this medicine.     If you get dizzy, sit or lie down at the first signs. Be careful going up and down stairs.  Be extra careful to prevent falls.     Never give this medicine to others.     Keep this medicine out of reach of children.     Do not take or save old medicines. Throw them away when outdated.     Keep all medicines in a cool, dry place. DO NOT keep them in your bathroom medicine cabinet or in a cabinet above the stove.    MEDICATION REFILLS  Please be aware that we cannot refill any prescriptions through the ER. If you need further treatment from what is provided at your ER visit, please follow up with your primary care doctor or your pain management specialist.    [facility]  Did you know [facility] has two freestanding ERs located just a few miles away?  [facility] [place] have short wait times, easy free parking directly in front of the building and top patient satisfaction scores - and the same Board Certified Emergency Medicine doctors as [facility].

## 2020-03-26 ENCOUNTER — Encounter (INDEPENDENT_AMBULATORY_CARE_PROVIDER_SITE_OTHER): Payer: Self-pay | Admitting: Surgery

## 2020-03-26 NOTE — Progress Notes (Deleted)
CLINIC PROGRESS NOTE    VASCULAR SURGERY & ENDOVASCULAR INTERVENTION     Date: 03/26/2020  Patient Name: Tracy [name]  Attending Physician: Benita Gutter, MD, FACS, RPVI  Chief Complaint: No chief complaint on file.      History of Present Illness:   Tracy [name] is a 55 y.o. female who presents to the clinic today for ***    Past Medical History:     Past Medical History:   Diagnosis Date   . Anemia    . Arthritis    . Carotid stenosis    . Diarrhea 12/2016    Since I had my dual switch surgery   . Fatty liver 2017   . GERD (gastroesophageal reflux disease)    . Hepatic cirrhosis    . Hiatal hernia    . Hyperthyroidism 2013    Thyroid removed   . Sleep apnea     not using CPAP machine   . Vitamin D deficiency        Past Surgical History:     Past Surgical History:   Procedure Laterality Date   . CHOLECYSTECTOMY  12/2016   . COLONOSCOPY, BIOPSY N/A 01/27/2018    Procedure: COLONOSCOPY, BIOPSY;  Surgeon: Shanon Brow, MD;  Location: [facility];  Service: Gastroenterology;  Laterality: N/A;   . 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 EGD, BIOPSY N/A 01/27/2018    Procedure: EGD, BIOPSY;  Surgeon: Shanon Brow, MD;  Location: [facility];  Service: Gastroenterology;  Laterality: N/A;   . GASTRECTOMY  09/2013   . HYSTERECTOMY  2015    Partial.   . LAPAROSCOPIC REVISION SLEEVE GASTRECTOMY TO GASTRIC BYPASS     . LAPAROSCOPIC, GASTRECTOMY SLEEVE     . SALPINGO OOPHERECTOMY Bilateral    . THYROIDECTOMY  2013       Family History:     Family History   Problem Relation Age of Onset   . Cancer Mother         hx ovarian   . Hypertension Mother    . Inflammatory bowel disease Mother    . Heart failure Mother    . Angina Mother    . Migraines Mother    . Asthma Father    . Diabetes Father    . Hyperlipidemia Father    . Hypertension Father    . Cancer Father         Currently has cancer   . Crohn's disease Father    . Asthma Sister    . Diabetes Sister    . Hyperlipidemia Sister    . Hypertension Sister     . Early death Sister    . Cancer Brother         hx of stomach   . Hyperlipidemia Brother    . Hypertension Brother    . Migraines Brother    . Learning disabilities Brother         Deaf   . Breast cancer Paternal Aunt         died of    . Breast cancer Other         maternal cousin-- survior   . Obesity Son        Social History:     Social History     Tobacco Use   . Smoking status: Never Smoker   . Smokeless tobacco: Never Used   . Tobacco comment: None   Vaping Use   . Vaping Use:  Never used   Substance Use Topics   . Alcohol use: No   . Drug use: Not Currently       Allergies:     Allergies   Allergen Reactions   . Bee Pollen Swelling     "Bee Stings".Marland KitchenMarland KitchenMarland KitchenStuffy nose; "throat swelling"     . Vicodin [Hydrocodone-Acetaminophen] Nausea And Vomiting       Medications:     Current Outpatient Medications:   .  albuterol sulfate HFA (PROVENTIL) 108 (90 Base) MCG/ACT inhaler, Inhale 2 puffs into the lungs every 4 (four) hours as needed for Wheezing or Shortness of Breath (cough), Disp: 1 Inhaler, Rfl: 0  .  amoxicillin-clavulanate (AUGMENTIN) 875-125 MG per tablet, Take 1 tablet by mouth in the morning and 1 tablet in the evening. Do all this for 10 days., Disp: 20 tablet, Rfl: 0  .  Armour Thyroid 60 MG tablet, TAKE 1 TABLET(60 MG) BY MOUTH DAILY, Disp: 30 tablet, Rfl: 5  .  Beclomethasone Dipropionate (Qnasl) 80 MCG/ACT Aero Soln, 2 sprays by Nasal route daily, Disp: 3 Inhaler, Rfl: 1  .  Biotin 5 MG/ML Liquid, Take by mouth daily  , Disp: , Rfl:   .  butalbital-acetaminophen-caffeine (FIORICET) 50-325-40 MG per tablet, Take 1 tablet by mouth every 4 (four) hours as needed for Headaches, Disp: 15 tablet, Rfl: 0  .  calcium carbonate 1500 (600 Ca) MG Tab tablet, Take 600 mg by mouth, Disp: , Rfl:   .  calcium-vitamin D (OSCAL-500 + D) 500-200 MG-UNIT per tablet, Take 1 tablet by mouth daily, Disp: , Rfl:   .  cetirizine (ZyrTEC Allergy) 10 MG tablet, Take 1 tablet (10 mg total) by mouth daily, Disp: 90 tablet, Rfl:  0  .  Cyanocobalamin 500 MCG/0.1ML Solution, 1 spray via nares weekly, Disp: 1 Bottle, Rfl: 3  .  diclofenac Sodium (VOLTAREN) 1 % Gel topical gel, Apply 4 g to affected area 4 times daily as needed.  Max: 16 g/joint/day.  Do not apply to broken, inflamed, or infected skin., Disp: 100 g, Rfl: 1  .  montelukast (SINGULAIR) 10 MG tablet, TAKE 1 TABLET(10 MG) BY MOUTH EVERY NIGHT, Disp: 90 tablet, Rfl: 0  .  Multiple Vitamins-Minerals (MULTIVITAMIN WITH MINERALS) tablet, Take 1 tablet by mouth daily, Disp: , Rfl:   .  Spacer/Aero-Holding Chambers (AEROCHAMBER Z-STAT PLUS/MEDIUM) inhaler, Use as instructed, Disp: 1 each, Rfl: 0  .  vitamin D (CHOLECALCIFEROL) 25 MCG (1000 UT) tablet, Take 1,000 Units by mouth daily, Disp: , Rfl:     Review of Systems:   General: negative for - chills, fever, night sweats  Psychological: negative for - depression, amnesia, suicidal ideation  Ophthalmic: negative for - blurry vision, double vision, blindness  ENT: negative for - epistaxis, deafness, tinnitus, vertigo, dysphagia  Allergy and Immunology: negative for - hives  Hematological and Lymphatic:  negative for - bleeding problems, blood clots, jaundice, swollen lymph nodes  Endocrine:  negative for - palpitations, polydipsia/polyuria  Respiratory: negative for - cough, hemoptysis, shortness of breath, wheezing  Cardiovascular: negative for - chest pain, irregular heartbeat, palpitations  Gastrointestinal: negative for - abdominal pain, early satiety, blood in stools, constipation, diarrhea, hematemesis, melena, nausea/vomiting  Genito-Urinary: negative for - dysuria, hematuria  Musculoskeletal: negative for - joint pain, joint swelling, muscle pain or muscular weakness  Neurological: negative for - TIA or stroke symptoms, dizziness, headaches, memory loss, seizures, paralysis  Dermatological:  negative for lumps, pruritis or rash    Physical Exam:  There were no vitals filed for this visit.    There is no height or weight on file  to calculate BMI.    HEENT:  N/C, AT, CN 2-12 intact, no LAD, no TMG, O/P clear, no carotid bruits  CARDIAC:  S1, S2, RRR, no M/G/R  CHEST:  CTA  ABD:  S/NT/ND, no pulsatile abdominal masses, no tenderness, no guarding, no hernias, normal BS, no abdominal or femoral bruits   EXTR:  No C/C/E, no ulcers, no varicose veins  PULSES:    R: 2+ radial, ulnar, brachial, carotid, femoral, popliteal, DP, PT  L:  2+ radial, ulnar, brachial, carotid, femoral, popliteal, DP, PT    Labs:   None    Rads:   Per HPI    Problem List:     Patient Active Problem List   Diagnosis   . Migraine   . History of hypertension   . Abdominal cyst   . Hiatal hernia   . Hypokalemia   . S/P gastric bypass   . Fatty liver   . Postsurgical hypothyroidism   . Tubular adenoma of colon   . Allergic rhinitis   . Sleep difficulties   . Difficulty concentrating   . Forgetfulness   . Hx of migraine headaches   . Occipital headache   . Frontal headache   . Hx of concussion   . White matter abnormality on MRI of brain   . Hx of sleep apnea   . Irritable bowel syndrome with diarrhea   . Chronic diarrhea       Assessment:   55 y.o. female with    No diagnosis found.  ***    Plan:   ***    No follow-ups on file.    Thank you for allowing me the privilege of participating in the care of your patient.  Please call me if you have any questions.      Sincerely,    Maseer Lutricia Horsfall, MD, FACS, RPVI  Vascular Surgery, Endovascular Intervention & Vascular Medicine    [facility]  Assistant Professor of Vascular Surgery   [facility]  Tel: [phone] / Fax: [phone]  ***  No orders of the defined types were placed in this encounter.        This note was generated by the Epic EMR system/ Dragon speech recognition and may contain inherent errors or omissions not intended by the user. Grammatical errors, random word insertions, deletions, pronoun errors and incomplete sentences are occasional consequences of  this technology due to software limitations. Not all errors are caught or corrected. If there are questions or concerns about the content of this note or information contained within the body of this dictation they should be addressed directly with the author for clarification.

## 2020-03-28 ENCOUNTER — Encounter (INDEPENDENT_AMBULATORY_CARE_PROVIDER_SITE_OTHER): Payer: Self-pay

## 2020-03-28 NOTE — Progress Notes (Signed)
Room:         Appt Date/Time: 3/23 130pm    55 y.o. female   PCP: Jules Husbands, DO  Referring Physician:   PCP  New Patient Follow-Up Post-op   [place] [facility] [place] [facility] Other Imaging CT head and neck 3/18 Epic report attached      Chief Complaint/HPI:  DX.CAROTID STENOSIS; went to ed with nonintractable headache         Allergies:   Allergies   Allergen Reactions   . Bee Pollen Swelling     "Bee Stings".Marland KitchenMarland KitchenMarland KitchenStuffy nose; "throat swelling"     . Vicodin [Hydrocodone-Acetaminophen] Nausea And Vomiting       Selected Medications:  none  Coumadin Xarelto Eliquis Pradaxa Lovenox Other thinner:    Plavix Aspirin Brillinta Effient  Pletal Trental Fish Oil   Insulin Metformin Other Diabetes Atorvastatin Rosuvastatin Other Chol: augmentin   MHx: none listed   Stroke / TIA / Seizures HTN / HLD  Diabetes   MI / CAD A-fib / Arrhythmia  COPD / Asthma / (other lung)   Kidney Disease Liver Disease DVT / Coagulopathy   Wounds Spine / other MSK  Cancer            Smoker           Former Smoker   x       Never Smoker    Vital Signs this Visit Pulses  HT  BP R BP L Temp   Rad Ulnar Brach Fem Pop DP PT        R          WT  Pulse R Pulse L SpO2       L          Imaging results:        Plan of Care:

## 2020-03-29 ENCOUNTER — Encounter (INDEPENDENT_AMBULATORY_CARE_PROVIDER_SITE_OTHER): Payer: Self-pay | Admitting: Surgery

## 2020-03-29 ENCOUNTER — Ambulatory Visit (INDEPENDENT_AMBULATORY_CARE_PROVIDER_SITE_OTHER): Payer: No Typology Code available for payment source | Admitting: Surgery

## 2020-03-29 VITALS — BP 140/84 | HR 52 | Ht 62.5 in | Wt 139.0 lb

## 2020-03-29 DIAGNOSIS — Z8679 Personal history of other diseases of the circulatory system: Secondary | ICD-10-CM

## 2020-03-29 NOTE — Progress Notes (Signed)
OFFICE VISIT  Vascular and Endovascular Surgery      Date Time: March 29, 2020  1:18 PM  Consulting Physician: Clarene Critchley. White, MD  Consulting Service: Vascular and Endovascular Surgery    REASON FOR OFFICE VISIT:    No chief complaint on file.      HISTORY OF PRESENT ILLNESS:  Tracy [name] is a 55 y.o. female with a several year hx of headaches. Recently she also notes some problems with memory and word finding. 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 She was seen and evaluated by Dr. Eben Burow at [facility]. Non operative therapy was recommended at that time. She was recently seen in the ED for worsening of her headache. 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 She has no amaurosis fugax, slurred speech, facial droop, unilateral limb weakness or other sym[ptms consistent with TIAs or strokes.      Past Medical History:   Diagnosis Date   . Anemia    . Arthritis    . Carotid stenosis    . Diarrhea 12/2016    Since I had my dual switch surgery   . Fatty liver 2017   . GERD (gastroesophageal reflux disease)    . Hepatic cirrhosis    . Hiatal hernia    . Hyperthyroidism 2013    Thyroid removed   . Sleep apnea     not using CPAP machine   . Vitamin D deficiency        Past Surgical History:   Procedure Laterality Date   . CHOLECYSTECTOMY  12/2016   . COLONOSCOPY, BIOPSY N/A 01/27/2018    Procedure: COLONOSCOPY, BIOPSY;  Surgeon: Shanon Brow, MD;  Location: [facility];  Service: Gastroenterology;  Laterality: N/A;   . 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 EGD, BIOPSY N/A 01/27/2018    Procedure: EGD, BIOPSY;  Surgeon: Shanon Brow, MD;  Location: [facility];  Service: Gastroenterology;  Laterality: N/A;   . GASTRECTOMY  09/2013   . HYSTERECTOMY  2015    Partial.   . LAPAROSCOPIC REVISION SLEEVE GASTRECTOMY TO GASTRIC BYPASS     . LAPAROSCOPIC, GASTRECTOMY SLEEVE     . SALPINGO OOPHERECTOMY Bilateral    . THYROIDECTOMY  2013        Family History   Problem Relation Age of Onset   . Cancer Mother         hx ovarian   . Hypertension Mother    . Inflammatory bowel disease Mother    . Heart failure Mother    . Angina Mother    . Migraines Mother    . Asthma Father    . Diabetes Father    . Hyperlipidemia Father    . Hypertension Father    . Cancer Father         Currently has cancer   . Crohn's disease Father    . Asthma Sister    . Diabetes Sister    . Hyperlipidemia Sister    . Hypertension Sister    . Early death Sister    . Cancer Brother         hx of stomach   . Hyperlipidemia Brother    . Hypertension Brother    . Migraines Brother    . Learning disabilities Brother         Deaf   . Breast cancer Paternal Aunt  died of    . Breast cancer Other         maternal cousin-- survior   . Obesity Son        Allergies   Allergen Reactions   . Bee Pollen Swelling     "Bee Stings".Marland KitchenMarland KitchenMarland KitchenStuffy nose; "throat swelling"     . Vicodin [Hydrocodone-Acetaminophen] Nausea And Vomiting       No current facility-administered medications for this visit.     CURRENT MEDICATIONS:    Current Outpatient Medications:   .  albuterol sulfate HFA (PROVENTIL) 108 (90 Base) MCG/ACT inhaler, Inhale 2 puffs into the lungs every 4 (four) hours as needed for Wheezing or Shortness of Breath (cough), Disp: 1 Inhaler, Rfl: 0  .  amoxicillin-clavulanate (AUGMENTIN) 875-125 MG per tablet, Take 1 tablet by mouth in the morning and 1 tablet in the evening. Do all this for 10 days., Disp: 20 tablet, Rfl: 0  .  Armour Thyroid 60 MG tablet, TAKE 1 TABLET(60 MG) BY MOUTH DAILY, Disp: 30 tablet, Rfl: 5  .  Beclomethasone Dipropionate (Qnasl) 80 MCG/ACT Aero Soln, 2 sprays by Nasal route daily, Disp: 3 Inhaler, Rfl: 1  .  Biotin 5 MG/ML Liquid, Take by mouth daily  , Disp: , Rfl:   .  butalbital-acetaminophen-caffeine (FIORICET) 50-325-40 MG per tablet, Take 1 tablet by mouth every 4 (four) hours as needed for Headaches, Disp: 15 tablet, Rfl: 0  .  calcium carbonate 1500 (600  Ca) MG Tab tablet, Take 600 mg by mouth, Disp: , Rfl:   .  calcium-vitamin D (OSCAL-500 + D) 500-200 MG-UNIT per tablet, Take 1 tablet by mouth daily, Disp: , Rfl:   .  cetirizine (ZyrTEC Allergy) 10 MG tablet, Take 1 tablet (10 mg total) by mouth daily, Disp: 90 tablet, Rfl: 0  .  Cyanocobalamin 500 MCG/0.1ML Solution, 1 spray via nares weekly, Disp: 1 Bottle, Rfl: 3  .  diclofenac Sodium (VOLTAREN) 1 % Gel topical gel, Apply 4 g to affected area 4 times daily as needed.  Max: 16 g/joint/day.  Do not apply to broken, inflamed, or infected skin., Disp: 100 g, Rfl: 1  .  montelukast (SINGULAIR) 10 MG tablet, TAKE 1 TABLET(10 MG) BY MOUTH EVERY NIGHT, Disp: 90 tablet, Rfl: 0  .  Multiple Vitamins-Minerals (MULTIVITAMIN WITH MINERALS) tablet, Take 1 tablet by mouth daily, Disp: , Rfl:   .  Spacer/Aero-Holding Chambers (AEROCHAMBER Z-STAT PLUS/MEDIUM) inhaler, Use as instructed, Disp: 1 each, Rfl: 0  .  vitamin D (CHOLECALCIFEROL) 25 MCG (1000 UT) tablet, Take 1,000 Units by mouth daily, Disp: , Rfl:       REVIEW OF SYSTEMS:  A comprehensive twelve point review of systems was: Negative in the neurologic, respiratory, cardiac, vascular, gastrointestinal, genitourinary, musculoskeletal, immunologic,psychiatric or endocrinologic systems except as mentioned above.     PHYSICAL EXAMINATION:  Visit Vitals  LMP  (LMP Unknown)         General: NAD; patient appears stated age, speaks in full sentences  Neuro: Grossly intact; AAOx3, appropriate mood and affect  Psych: Appropriate mood and affect  HEENT: Atraumatic, normocephalic  Skin:  Color appropriate for race, Skin warm and dry  Extremities:  Warm, dry, Motor Strength: Bilaterally symmetrical upper and lower extremity motor strength    Vascular exam:    Radial (Right): Palpable  Radial (Left): Palpable    LABORATORY AND IMAGING STUDIES:    CBC:   WBC   Date/Time Value Ref Range Status   03/23/2020 07:29 PM 3.87 3.10 -  9.50 x10 3/uL Final     RBC   Date/Time Value Ref Range  Status   03/23/2020 07:29 PM 3.49 (L) 3.90 - 5.10 x10 6/uL Final     Hgb   Date/Time Value Ref Range Status   03/23/2020 07:29 PM 9.9 (L) 11.4 - 14.8 g/dL Final     Hematocrit   Date/Time Value Ref Range Status   03/23/2020 07:29 PM 31.8 (L) 34.7 - 43.7 % Final     MCV   Date/Time Value Ref Range Status   03/23/2020 07:29 PM 91.1 78.0 - 96.0 fL Final     MCHC   Date/Time Value Ref Range Status   03/23/2020 07:29 PM 31.1 (L) 31.5 - 35.8 g/dL Final     RDW   Date/Time Value Ref Range Status   03/23/2020 07:29 PM 15 11 - 15 % Final     Platelets   Date/Time Value Ref Range Status   03/23/2020 07:29 PM 207 142 - 346 x10 3/uL Final       CMP:   Sodium   Date/Time Value Ref Range Status   03/23/2020 07:29 PM 141 136 - 145 mEq/L Final     Potassium   Date/Time Value Ref Range Status   03/23/2020 07:29 PM 3.8 3.5 - 5.1 mEq/L Final     Chloride   Date/Time Value Ref Range Status   03/23/2020 07:29 PM 110 100 - 111 mEq/L Final     CO2   Date/Time Value Ref Range Status   03/23/2020 07:29 PM 24 22 - 29 mEq/L Final     Glucose   Date/Time Value Ref Range Status   03/23/2020 07:29 PM 84 70 - 100 mg/dL Final     Comment:     ADA guidelines for diabetes mellitus:  Fasting:  Equal to or greater than 126 mg/dL  Random:   Equal to or greater than 200 mg/dL       BUN   Date/Time Value Ref Range Status   03/23/2020 07:29 PM 14.0 7.0 - 19.0 mg/dL Final     Protein, Total   Date/Time Value Ref Range Status   07/05/2019 08:47 AM 6.4 6.0 - 8.3 g/dL Final     Alkaline Phosphatase   Date/Time Value Ref Range Status   07/05/2019 08:47 AM 123 (H) 37 - 106 U/L Final     AST (SGOT)   Date/Time Value Ref Range Status   07/05/2019 08:47 AM 173 (H) 5 - 34 U/L Final     ALT   Date/Time Value Ref Range Status   07/05/2019 08:47 AM 104 (H) 0 - 55 U/L Final     Anion Gap   Date/Time Value Ref Range Status   03/23/2020 07:29 PM 7.0 5.0 - 15.0 Final     Comment:     Calculated AGAP = Na - (CL + CO2)  Interpret with caution; calculated AGAP may not  reflect patient's  true clinical status.         Lipid Panel   Cholesterol   Date/Time Value Ref Range Status   04/07/2019 10:05 AM 98 0 - 199 mg/dL Final     Triglycerides   Date/Time Value Ref Range Status   04/07/2019 10:05 AM 34 34 - 149 mg/dL Final     HDL   Date/Time Value Ref Range Status   04/07/2019 10:05 AM 32 (L) 40 - 9,999 mg/dL Final     Comment:     An HDL cholesterol <40 mg/dL is low and  constitutes a  coronary heart disease risk factor, and HDL-C>59 mg/dL is  a negative risk factor for CHD.  Ref: American Heart Association; Circulation 2004         Coags:   PT   Date/Time Value Ref Range Status   08/15/2017 12:37 PM 14.2 12.6 - 15.0 sec Final     PT INR   Date/Time Value Ref Range Status   08/15/2017 12:37 PM 1.1 0.9 - 1.1 Final     Comment:     Recommended Ranges for Protime INR:    2.0-3.0 for most medical and surgical thromboembolic states    2.5-3.5 for artificial heart valves  INR result may not represent exact Warfarin dosing level during  the transition period from Heparin to Warfarin therapy.  Results should be interpreted based on current anticoagulant  therapy and patient's clinical presentation.       PTT   Date/Time Value Ref Range Status   08/14/2017 08:39 AM 27 23 - 37 sec Final     Comment:     In vivo therapeutic range of heparin (0.3 - 0.7 IU/mL)  correlate with the following APTT times: 64 - 102 seconds.  Results should be interpreted based on current anticoagulant  therapy and patient's clinical presentation.         Radiology:  CT Angiogram Head Neck    Result Date: 03/23/2020  HISTORY: Neck pain and vision changes. COMPARISON: None. TECHNIQUE: A CT angiogram of the head and neck  was obtained  with IV contrast.  The patient received an intravenous injection of 75 mL of Omnipaque-350 contrast material. Maximum intensity projection images and/or 3-D reconstructed images were submitted and reviewed.  Coronal and sagittal reconstructed MPR images are reviewed. Any proximal internal  carotid artery narrowing was determined utilizing the distal internal carotid artery as a reference, similar to the NASCET methodology. The following dose reduction techniques were utilized: automated exposure control and/or adjustment of the mA and/or KV according to patient size, and the use of an iterative reconstruction technique. FINDINGS:  The great vessels originate from the aortic arch. The right common carotid artery is patent. There is a shelf like/linear thin smooth filling defect along the posterior wall of the right internal carotid artery just beyond the carotid bifurcation consistent with a carotid web. There is no stenosis of the proximal right internal carotid artery  based on NASCET criteria.  The more distal cervical right internal carotid artery is patent. The left common carotid artery is patent. There is calcified and noncalcified atherosclerotic plaque at the left carotid bifurcation without stenosis of the proximal left internal carotid artery based on NASCET criteria.  The more distal cervical left internal carotid artery is patent.   Both the right and left vertebral arteries are patent in the neck.  No arterial dissection is seen. No abnormalities of the extracranial or petrous internal carotid arteries are seen. The carotid siphons are normal in caliber. The bilateral supraclinoid internal carotid bifurcate into normal appearing anterior and middle cerebral arterial distributions. The bilateral extracranial vertebral arteries are normal in caliber. The intradural distal vertebral arteries fuse to form a normal caliber basilar artery. The basilar artery bifurcates into normal appearing posterior cerebral arterial distributions. Specifically, no focal stenosis or aneurysm is identified.     1. A right carotid web is present. 2. There is no stenosis of the right common carotid artery bifurcation and proximal right internal carotid artery carotid artery  based on NASCET criteria.  3. There is  no stenosis of the left common carotid artery bifurcation and proximal left internal carotid artery carotid artery  based on NASCET criteria. 4. The vertebral arteries are patent. 5. Unremarkable CTA of the brain. Neldon Mc, MD  03/23/2020 9:56 PM    CT Head WO Contrast    Result Date: 03/23/2020  HISTORY:  Neck pain and vision disturbance COMPARISON: None TECHNIQUE: Unenhanced CT scan of the brain was performed.. The following dose reduction techniques were utilized: automated exposure control and/or adjustment of the mA and/or kV according to patient size, and the use of iterative reconstruction technique. FINDINGS: The cerebral ventricles, cortical sulci, basilar cisterns are within normal limits. Gray-white differentiation is within normal limits. There is no acute intracranial hemorrhage or mass effect. The calvarium appears intact. The visualized upper paranasal sinuses and mastoid air cells are clear. IMPRESSION  :No acute intracranial hemorrhage or mass effect is identified. 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 No suspicious masses are currently seen in the neck. No pathologic adenopathy is seen. No suspicious cysts or collection are seen      Unremarkable appearance of the neck. Laurena Slimmer, MD  03/23/2020 10:14 PM          Patient Active Problem List   Diagnosis   . Migraine   . History of hypertension   . Abdominal cyst   . Hiatal hernia   . Hypokalemia   . S/P gastric bypass   . Fatty liver   . Postsurgical hypothyroidism   . Tubular adenoma of colon   . Allergic rhinitis   . Sleep difficulties   . Difficulty concentrating   . Forgetfulness   . Hx of migraine headaches   . Occipital headache   . Frontal headache   . Hx of concussion   . White matter  abnormality on MRI of brain   . Hx of sleep apnea   . Irritable bowel syndrome with diarrhea   . Chronic diarrhea         ASSESSMENT:  55 y.o. 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 This has been present for several years and the patient has not focal symptoms. I emailed the vascular surgeon who saw her in 2018 at [place]. We will also obtain a carotid duplex to assess hemodynamic signiifcance of this lesion. Patient also scheduled to follow up with neurologist.     PLAN:  - Carotid duplex.      Clarene Critchley. White, MD    Vascular and Endovascular Surgery  [facility]  [facility]  [phone]

## 2020-03-30 NOTE — ED Provider Notes (Signed)
History     Chief Complaint   Patient presents with   . Headache   . Blurred Vision     Pt with hx of " carotid artery abnormality" for several years  Was first diagnosed when she was living in [place]; course of tx was close monitoring per patient. No surgical intervention was recommended  Since then pt has relocated to the [place]; pt notes she feels her physicians are not taking her condition seriously and she is also concerned that even though she knows all her records arein the system, she is not convinced that her physicians have reviewed them and are addressing the issue  Pt has seen at neurologist but not definite treatment plan  Presents to er today with constant h/a which she feels has worsened and is now causing neck pressure  Patient denied any blurred vision even though triage note mentions episodes of blurred vision.    The history is provided by the patient. No language interpreter was used.   Headache       Past Medical History:   Diagnosis Date   . Anemia    . Arthritis    . Carotid stenosis    . Diarrhea 12/2016    Since I had my dual switch surgery   . Fatty liver 2017   . GERD (gastroesophageal reflux disease)    . Hepatic cirrhosis    . Hiatal hernia    . Hyperthyroidism 2013    Thyroid removed   . Sleep apnea     not using CPAP machine   . Vitamin D deficiency        Past Surgical History:   Procedure Laterality Date   . CHOLECYSTECTOMY  12/2016   . COLONOSCOPY, BIOPSY N/A 01/27/2018    Procedure: COLONOSCOPY, BIOPSY;  Surgeon: Shanon Brow, MD;  Location: [facility];  Service: Gastroenterology;  Laterality: N/A;   . 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 EGD, BIOPSY N/A 01/27/2018    Procedure: EGD, BIOPSY;  Surgeon: Shanon Brow, MD;  Location: [facility];  Service: Gastroenterology;  Laterality: N/A;   . GASTRECTOMY  09/2013   . HYSTERECTOMY  2015    Partial.   . LAPAROSCOPIC REVISION SLEEVE GASTRECTOMY TO GASTRIC BYPASS     . LAPAROSCOPIC, GASTRECTOMY SLEEVE     . SALPINGO  OOPHERECTOMY Bilateral    . THYROIDECTOMY  2013       Family History   Problem Relation Age of Onset   . Cancer Mother         hx ovarian   . Hypertension Mother    . Inflammatory bowel disease Mother    . Heart failure Mother    . Angina Mother    . Migraines Mother    . Asthma Father    . Diabetes Father    . Hyperlipidemia Father    . Hypertension Father    . Cancer Father         Currently has cancer   . Crohn's disease Father    . Asthma Sister    . Diabetes Sister    . Hyperlipidemia Sister    . Hypertension Sister    . Early death Sister    . Cancer Brother         hx of stomach   . Hyperlipidemia Brother    . Hypertension Brother    . Migraines Brother    . Learning disabilities Brother  Deaf   . Breast cancer Paternal Aunt         died of    . Breast cancer Other         maternal cousin-- survior   . Obesity Son        Social  Social History     Tobacco Use   . Smoking status: Never Smoker   . Smokeless tobacco: Never Used   . Tobacco comment: None   Vaping Use   . Vaping Use: Never used   Substance Use Topics   . Alcohol use: No   . Drug use: Not Currently       .     Allergies   Allergen Reactions   . Bee Pollen Swelling     "Bee Stings".Marland KitchenMarland KitchenMarland KitchenStuffy nose; "throat swelling"     . Vicodin [Hydrocodone-Acetaminophen] Nausea And Vomiting       Home Medications             albuterol sulfate HFA (PROVENTIL) 108 (90 Base) MCG/ACT inhaler     Inhale 2 puffs into the lungs every 4 (four) hours as needed for Wheezing or Shortness of Breath (cough)     amoxicillin-clavulanate (AUGMENTIN) 875-125 MG per tablet     Take 1 tablet by mouth in the morning and 1 tablet in the evening. Do all this for 10 days.     Armour Thyroid 60 MG tablet     TAKE 1 TABLET(60 MG) BY MOUTH DAILY     Beclomethasone Dipropionate (Qnasl) 80 MCG/ACT Aero Soln     2 sprays by Nasal route daily     Biotin 5 MG/ML Liquid     Take by mouth daily        calcium carbonate 1500 (600 Ca) MG Tab tablet     Take 600 mg by mouth      calcium-vitamin D (OSCAL-500 + D) 500-200 MG-UNIT per tablet     Take 1 tablet by mouth daily     cetirizine (ZyrTEC Allergy) 10 MG tablet     Take 1 tablet (10 mg total) by mouth daily     Cyanocobalamin 500 MCG/0.1ML Solution     1 spray via nares weekly     diclofenac Sodium (VOLTAREN) 1 % Gel topical gel     Apply 4 g to affected area 4 times daily as needed.  Max: 16 g/joint/day.  Do not apply to broken, inflamed, or infected skin.     montelukast (SINGULAIR) 10 MG tablet     TAKE 1 TABLET(10 MG) BY MOUTH EVERY NIGHT     Multiple Vitamins-Minerals (MULTIVITAMIN WITH MINERALS) tablet     Take 1 tablet by mouth daily     Spacer/Aero-Holding Chambers (AEROCHAMBER Z-STAT PLUS/MEDIUM) inhaler     Use as instructed     vitamin D (CHOLECALCIFEROL) 25 MCG (1000 UT) tablet     Take 1,000 Units by mouth daily                     Review of Systems   Neurological: Positive for headaches.   All other systems reviewed and are negative.      Physical Exam    BP: 152/84, Heart Rate: 66, Temp: 98.3 F (36.8 C), Resp Rate: 18, SpO2: 99 %, Weight: 62.1 kg    Physical Exam  Vitals and nursing note reviewed.   Constitutional:       General: She is not in acute distress.     Appearance: She  is well-developed. She is not diaphoretic.   HENT:      Head: Normocephalic and atraumatic.      Right Ear: External ear normal.      Left Ear: External ear normal.      Nose: Nose normal.   Eyes:      General:         Right eye: No discharge.         Left eye: No discharge.      Conjunctiva/sclera: Conjunctivae normal.      Pupils: Pupils are equal, round, and reactive to light.   Neck:      Trachea: Phonation normal. No tracheal deviation.   Cardiovascular:      Rate and Rhythm: Normal rate and regular rhythm.      Pulses:           Radial pulses are 2+ on the right side and 2+ on the left side.   Pulmonary:      Effort: Pulmonary effort is normal. No respiratory distress.   Musculoskeletal:         General: Normal range of motion.       Cervical back: Normal range of motion and neck supple.   Skin:     General: Skin is warm and dry.   Neurological:      Mental Status: She is alert and oriented to person, place, and time.      GCS: GCS eye subscore is 4. GCS verbal subscore is 5. GCS motor subscore is 6.      Cranial Nerves: No cranial nerve deficit.   Psychiatric:         Speech: Speech normal.           MDM and ED Course     Spoke to patient at length.  Reviewed all labs and imaging studies.  No acute findings on CTA of head and neck.  CTA shows a carotid valve on the right side without any evidence of stenosis in either left or right common carotid.  The vertebral arteries are patent.  Normal CTA of the brain.  Patient was advised to follow-up with vascular specialist for further evaluation of carotid valve.  Patient went clinic headache with no significant change in nature of headaches.  Patient is concerned was that no one has followed up on the abnormal CTA finding from 2017.  Advised patient to follow-up with vascular specialist and with neurologist for management of headaches.  Patient seems to understand and is comfortable with plan.  Patient received pain medication which helped alleviate the pain.    ED Medication Orders (From admission, onward)    Start Ordered     Status Ordering Provider    03/23/20 2241 03/23/20 2240  butalbital-acetaminophen-caffeine (FIORICET) 50-325-40 MG per tablet 1 tablet  Once        Route: Oral  Ordered Dose: 1 tablet     Last MAR action: Given Tammala Weider    03/23/20 2238 03/23/20 2237    Once        Route: Intravenous  Ordered Dose: 1,000 mL     Discontinued Sara Selvidge    03/23/20 2238 03/23/20 2237  metoclopramide (REGLAN) injection 10 mg  Once        Route: Intravenous  Ordered Dose: 10 mg     Last MAR action: Given Eliga Arvie    03/23/20 2238 03/23/20 2237    Once        Route: Intravenous  Ordered Dose: 25  mg     Discontinued Tommi Rumps    03/23/20 2133 03/23/20 2134  iohexol (OMNIPAQUE) 350  MG/ML injection 75 mL  IMG once as needed        Route: Intravenous  Ordered Dose: 75 mL     Last MAR action: Imaging Agent Given Ramzey Petrovic             MDM  Number of Diagnoses or Management Options     Amount and/or Complexity of Data Reviewed  Clinical lab tests: ordered and reviewed  Tests in the radiology section of CPT: ordered and reviewed  Discuss the patient with other providers: yes    Risk of Complications, Morbidity, and/or Mortality  Presenting problems: moderate  Diagnostic procedures: moderate  Management options: moderate                     Procedures    Clinical Impression & Disposition     Clinical Impression  Final diagnoses:   Nonintractable headache, unspecified chronicity pattern, unspecified headache type        ED Disposition     ED Disposition   Discharge    Condition   --    Date/Time   Fri Mar 23, 2020 10:35 PM    Comment   Tate Jerkins discharge to home/self care.    Condition at disposition: Stable                Discharge Medication List as of 03/23/2020 10:38 PM           Treatment Team: Scribe: Ernest Pine, [place]  03/30/20 [phone]

## 2020-04-13 ENCOUNTER — Encounter (INDEPENDENT_AMBULATORY_CARE_PROVIDER_SITE_OTHER): Payer: Self-pay

## 2020-04-14 ENCOUNTER — Encounter (INDEPENDENT_AMBULATORY_CARE_PROVIDER_SITE_OTHER): Payer: Self-pay

## 2020-05-08 ENCOUNTER — Telehealth (INDEPENDENT_AMBULATORY_CARE_PROVIDER_SITE_OTHER): Payer: Self-pay | Admitting: Family Medicine

## 2020-05-08 NOTE — Telephone Encounter (Signed)
[place] from [facility] is requesting pt's recent office notes, lab results & imaging results (brain).     Ph. [phone] ext. [phone]  Fax. [phone]

## 2020-05-11 NOTE — Telephone Encounter (Signed)
sent 

## 2020-05-13 ENCOUNTER — Encounter (INDEPENDENT_AMBULATORY_CARE_PROVIDER_SITE_OTHER): Payer: Self-pay

## 2020-05-14 ENCOUNTER — Encounter (INDEPENDENT_AMBULATORY_CARE_PROVIDER_SITE_OTHER): Payer: Self-pay

## 2020-05-17 ENCOUNTER — Encounter (INDEPENDENT_AMBULATORY_CARE_PROVIDER_SITE_OTHER): Payer: Self-pay | Admitting: Neurology

## 2020-05-17 ENCOUNTER — Ambulatory Visit (INDEPENDENT_AMBULATORY_CARE_PROVIDER_SITE_OTHER): Payer: 59 | Admitting: Neurology

## 2020-05-17 VITALS — BP 145/80 | HR 50 | Ht 62.5 in | Wt 135.6 lb

## 2020-05-17 DIAGNOSIS — G43019 Migraine without aura, intractable, without status migrainosus: Secondary | ICD-10-CM

## 2020-05-17 DIAGNOSIS — I779 Disorder of arteries and arterioles, unspecified: Secondary | ICD-10-CM

## 2020-05-17 DIAGNOSIS — R9082 White matter disease, unspecified: Secondary | ICD-10-CM

## 2020-05-17 MED ORDER — UBROGEPANT 100 MG PO TABS
100.0000 mg | ORAL_TABLET | Freq: Once | ORAL | 5 refills | Status: DC | PRN
Start: 2020-05-17 — End: 2021-07-02

## 2020-05-17 NOTE — Progress Notes (Signed)
05/17/2020    Patient ID: Tracy [name]    Chief Complaint:    Chief Complaint   Patient presents with   . second opinio twisted atery on her neck    . Neck Pain     heada   . Headache     History of Present Illness:  Tracy [name] is a 55 y.o. female who presents for follow up of headache, neck pain.  She was previously seen by my colleague Dr. Artemio Aly.  She last saw him on 08/04/2018.  At that time he recommended MRI brain, home sleep study, Mg/B2/CoQ10 for headache prevention.     Her home sleep study was normal.   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     She has a several year history of headaches.   States she had headaches in her 73s.   They went away and came back.   Pain starts in the back of her head and moves to the front.   Tension or tightness.   Can be on either side.   Awakens her from sleep frequently.   Can worsen into a pulsating headache that lasts all day.   Associated with generalized weakness/fatigue, light and sound sensitivity, nausea.   Can last all day.   She treats with Butalbital-Acetaminophen-Caffeine for acute relief.   Works if "she catches it in time".  Worse with stress.   Ranges from 5-10 days per month.     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   Was seen at [facility].   Recommended non-operative management.   Seen in ED for worsening headaches in 03/2020.   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   Seen by Vascular Surgery in late March.   Carotid duplex recommended.     States she is taking baby aspirin 81 mg.     She was evaluated by Dr. Peggye Pitt at [facility].   Follow up MRI was ordered.     MyChart Hit-6 Headache Impact Test  Question 05/17/2020 8:20 AM EDT - Filed by Patient   When you have headaches, how often is the pain severe? Sometimes   How often do headaches limit your ability to do usual daily activities including household  work, work, school, or social activities? Very Often   When you have a headache, how often do you wish you could lie down? Very Often   In the past 4 weeks, how often have you felt too tired to do work or daily activities because of your headaches? Rarely   In the past 4 weeks, how often have you felt fed up or irritated because of your headaches? Rarely   In the past 4 weeks, how often did headaches limit your ability to concentrate on work or daily activities? Rarely   HIT-6 Total Score (range: 36 - 78) 56Critical       Previous Preventive Medications:  *Topiramate    Previous Acute Medications:  Acetaminophen  Butalbital-Acetaminophen-Caffeine    Review of Systems:  Constitutional: Negative for fever.   Respiratory: Negative for shortness of breath.    Cardiovascular: Negative for chest pain.   Gastrointestinal: Negative for abdominal pain. Positive for diarrhea.   Neurological: Negative for visual changes, alteration in awareness, speech or language problems, facial droop, focal weakness, focal numbness, limb incoordination, gait ataxia, dysphagia, or dizziness. Positive for neck pain, headaches, back pain, trouble  sleeping.    Psychiatric/Behavioral: Negative for mood disturbance.   All other systems reviewed and are negative except pertinent positives as noted above in HPI.     Past Medical History:  Past Medical History:   Diagnosis Date   . Anemia    . Arthritis    . Carotid stenosis    . Diarrhea 12/2016    Since I had my dual switch surgery   . Fatty liver 2017   . GERD (gastroesophageal reflux disease)    . Hepatic cirrhosis    . Hiatal hernia    . Hyperthyroidism 2013    Thyroid removed   . Sleep apnea     not using CPAP machine   . Vitamin D deficiency      Past Surgical History:  Past Surgical History:   Procedure Laterality Date   . CHOLECYSTECTOMY  12/2016   . COLONOSCOPY, BIOPSY N/A 01/27/2018    Procedure: COLONOSCOPY, BIOPSY;  Surgeon: Shanon Brow, MD;  Location: [facility];   Service: Gastroenterology;  Laterality: N/A;   . 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 EGD, BIOPSY N/A 01/27/2018    Procedure: EGD, BIOPSY;  Surgeon: Shanon Brow, MD;  Location: [facility];  Service: Gastroenterology;  Laterality: N/A;   . GASTRECTOMY  09/2013   . HYSTERECTOMY  2015    Partial.   . LAPAROSCOPIC REVISION SLEEVE GASTRECTOMY TO GASTRIC BYPASS     . LAPAROSCOPIC, GASTRECTOMY SLEEVE     . SALPINGO OOPHERECTOMY Bilateral    . THYROIDECTOMY  2013     Allergies:  Bee pollen and Vicodin [hydrocodone-acetaminophen]    Family History:  Family History   Problem Relation Age of Onset   . Cancer Mother         hx ovarian   . Hypertension Mother    . Inflammatory bowel disease Mother    . Heart failure Mother    . Angina Mother    . Migraines Mother    . Asthma Father    . Diabetes Father    . Hyperlipidemia Father    . Hypertension Father    . Cancer Father         Currently has cancer   . Crohn's disease Father    . Asthma Sister    . Diabetes Sister    . Hyperlipidemia Sister    . Hypertension Sister    . Early death Sister    . Cancer Brother         hx of stomach   . Hyperlipidemia Brother    . Hypertension Brother    . Migraines Brother    . Learning disabilities Brother         Deaf   . Breast cancer Paternal Aunt         died of    . Breast cancer Other         maternal cousin-- survior   . Obesity Son    No other family history related to current complaints.    Social History:  Social History     Tobacco Use   . Smoking status: Never Smoker   . Smokeless tobacco: Never Used   . Tobacco comment: None   Vaping Use   . Vaping Use: Never used   Substance Use Topics   . Alcohol use: No   . Drug use: Not Currently       Medications:  Current Outpatient Medications   Medication Sig Dispense Refill   .  albuterol sulfate HFA (PROVENTIL) 108 (90 Base) MCG/ACT inhaler Inhale 2 puffs into the lungs every 4 (four) hours as needed for Wheezing or Shortness of Breath (cough) 1 Inhaler 0   . Armour Thyroid 60 MG  tablet TAKE 1 TABLET(60 MG) BY MOUTH DAILY 30 tablet 5   . Beclomethasone Dipropionate (Qnasl) 80 MCG/ACT Aero Soln 2 sprays by Nasal route daily 3 Inhaler 1   . Biotin 5 MG/ML Liquid Take by mouth daily        . butalbital-acetaminophen-caffeine (FIORICET) 50-325-40 MG per tablet Take 1 tablet by mouth every 4 (four) hours as needed for Headaches 15 tablet 0   . calcium carbonate 1500 (600 Ca) MG Tab tablet Take 600 mg by mouth     . calcium-vitamin D (OSCAL-500 + D) 500-200 MG-UNIT per tablet Take 1 tablet by mouth daily     . cetirizine (ZyrTEC Allergy) 10 MG tablet Take 1 tablet (10 mg total) by mouth daily 90 tablet 0   . Cyanocobalamin 500 MCG/0.1ML Solution 1 spray via nares weekly 1 Bottle 3   . diclofenac Sodium (VOLTAREN) 1 % Gel topical gel Apply 4 g to affected area 4 times daily as needed.  Max: 16 g/joint/day.  Do not apply to broken, inflamed, or infected skin. 100 g 1   . montelukast (SINGULAIR) 10 MG tablet TAKE 1 TABLET(10 MG) BY MOUTH EVERY NIGHT 90 tablet 0   . Multiple Vitamins-Minerals (MULTIVITAMIN WITH MINERALS) tablet Take 1 tablet by mouth daily     . Spacer/Aero-Holding Chambers (AEROCHAMBER Z-STAT PLUS/MEDIUM) inhaler Use as instructed 1 each 0   . vitamin D (CHOLECALCIFEROL) 25 MCG (1000 UT) tablet Take 1,000 Units by mouth daily       No current facility-administered medications for this visit.       General Exam:  BP 145/80 (BP Site: Left arm, Patient Position: Sitting, Cuff Size: Small)   Pulse (!) 50   Ht 1.588 m (5' 2.5")   Wt 61.5 kg (135 lb 9.6 oz)   LMP  (LMP Unknown)   BMI 24.41 kg/m   Gen:  Well-developed, well-nourished, NAD.  HEENT:  NC/AT.  Neck:  Supple. Normal range of motion.   CV:  RRR.  S1/S2.  No murmurs.  No carotid bruits.   Lungs:  CTA bilaterally.   Extrem:  No edema.  Skin:  No obvious rashes or lesions.   Neuro Exam:  MENTAL STATUS:  Awake, alert, oriented x 3.  Normal attention and concentration. Follows commands.  Speech fluent, non-dysarthric. Recent and  remote memory intact.  Fund of knowledge appeared appropriate for education level.   CRANIAL NERVES:  PERRL.  VFFTC.  EOMI, no nystagmus.  Normal facial sensation to light touch.  No facial asymmetry.  Hearing intact to conversational voice.  Palate elevates in midline. Shoulder shrug symmetric.  Tongue protrusion midline.  MOTOR:  Normal bulk and tone.  Strength is full and symmetric in all 4 extremities, proximal and distal.  No drift.  No adventitious movements.   SENSATION:  Intact to light touch.   COORDINATION:  No dysmetria on finger to nose testing.  Normal RAMs.   DTRs: Symmetric.   GAIT:  Normal gait and station.     Investigations:  Labs:  Lab Results   Component Value Date    WBC 3.87 03/23/2020    HGB 9.9 (L) 03/23/2020    HCT 31.8 (L) 03/23/2020    MCV 91.1 03/23/2020    PLT 207 03/23/2020     '  Chemistry        Component Value Date/Time    NA 141 03/23/2020 1929    K 3.8 03/23/2020 1929    CL 110 03/23/2020 1929    CO2 24 03/23/2020 1929    BUN 14.0 03/23/2020 1929    CREAT 0.8 03/23/2020 1929    GLU 84 03/23/2020 1929        Component Value Date/Time    CA 8.7 03/23/2020 1929    ALKPHOS 123 (H) 07/05/2019 0847    AST 173 (H) 07/05/2019 0847    ALT 104 (H) 07/05/2019 0847    BILITOTAL 0.8 07/05/2019 0847        Imaging:  CT Head Without Contrast 03/23/2020:  IMPRESSION  :No acute intracranial hemorrhage or mass effect is  identified.    CTA Head/Neck 03/23/2020:  IMPRESSION:   1. A right carotid web is present.   2. There is no stenosis of the right common carotid artery bifurcation  and proximal right internal carotid artery carotid artery  based on  NASCET criteria.    3. There is no stenosis of the left common carotid artery bifurcation  and proximal left internal carotid artery carotid artery  based on  NASCET criteria.  4. The vertebral arteries are patent.  5. Unremarkable CTA of the brain.    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 Follow-up MRI of the brain with and without contrast is  recommended in 3 months.    Other Studies:  N/a    Records Reviewed:  Vascular surgery note from 03/29/2020  ED notes from 03/23/2020  Telemed note by Dr. Stacy Gardner from 08/04/2018    Impression:  Winta Barcelo is a 55 y.o. female who presents for follow up of headaches, carotid artery irregularity.     Patient has a long standing history of migraine, since her 81s, and current headaches are migrainous in character.  I feel the carotid irregularity (web vs. Chronic dissection) is incidental.      Recommendations:  1. Intractable migraine without aura and without status migrainosus  -We discussed option of prescription strength preventive med vs. Supplements; she would like to start with trials of Magnesium and Riboflavin for migraine prevention; Medication dosing schedule and potential side effects were reviewed.  -Trial of Ubrogepant for acute relief; Medication dosing schedule and potential side effects were reviewed.  -Would avoid triptans and DHE due to vascular lesion  -Patient was counseled via AVS to follow the SEEDS for success in headache management, including Sleep hygiene, Exercising regularly, Eating healthy and regular meals, Drinking water, keeping a headache Diary, and Stress reduction.  -Patient was counseled via AVS on the risk of acute medication overuse headache and was advised to limit acute medication use to no more than 3 days per week    2. White matter abnormality on MRI of brain  -Follow up MRI ordered by outside neurologist  -Patient to update me once this is done so I can follow up    3. Carotid artery disorder  -Stable across serial scans  -Continue aspirin    Follow up in 2 months.      Adonis Huguenin, MD  [facility]  Board Certified in Adult Neurology by the American Board of Psychiatry and Neurology (ABPN)  Board Certified in Headache  Medicine by the [employer]  [employer] (UCNS)  05/17/2020    Approximately 40 minutes were spent on the day of service including 25 minutes face-to-face office visit time with patient, 5 minutes coordinating care, 5 minutes on record review, and 5 minutes on documentation.

## 2020-05-17 NOTE — Patient Instructions (Signed)
-  Follow the SEEDS for success in headache management, including Sleep hygiene, Exercising regularly, Eating healthy and regular meals, Drinking water, keeping a headache Diary, and Stress reduction.    -Vitamin B2 (also called riboflavin) can be used for migraine prevention. The recommended dose is 400 mg daily.  This may turn the urine a bright yellow or orange color.     -Magnesium supplementation can help decrease headaches.  Start with 200-250 mg once a day.  Increase as tolerated to 400-500 mg twice a day.  Magnesium can have a laxative effect, so side effects can include diarrhea and stomach cramping.     -Magnesium and/or Riboflavin may need to be taken daily for 1-2 months before a positive effect on headache frequency and severity is noted.     -Try Bernita Raisin (also called ubrogepant) for headache relief/rescue.  Take one 100 mg pill at headache onset.  You may repeat once after 2 hours. Max of 2 doses in 24 hours. You may be eligible for the Ubrelvy patient copay card.  Sign up at:  [email or web address]    -Avoid use of any "triptans" or over-the-counter rescue/abortive medications (e.g. Advil/Ibuprofen, Aleve/Naproxen, Tylenol/Acetaminophen, Excedrin/Aspirin-Acetaminophen-Caffeine) more than 3 days per week to prevent rebound headaches    -Update me after you've had your MRI    -Otherwise follow up in 2-3 months

## 2020-05-18 ENCOUNTER — Encounter (INDEPENDENT_AMBULATORY_CARE_PROVIDER_SITE_OTHER): Payer: Self-pay | Admitting: Family Medicine

## 2020-05-21 ENCOUNTER — Telehealth: Payer: Self-pay

## 2020-05-21 NOTE — Telephone Encounter (Signed)
Requesting Prior Authorization for:  Ubrelvy 100mg  tabs  PA Outcome Approved  Quantity of 16 for a day supply of 30  Insurance [employer]  Reference # [id number]  Dates in effect 05/21/20 through 08/19/20  May fill #16/30 days  Co-pay $0.00

## 2020-06-03 ENCOUNTER — Other Ambulatory Visit (INDEPENDENT_AMBULATORY_CARE_PROVIDER_SITE_OTHER): Payer: Self-pay | Admitting: "Endocrinology

## 2020-06-03 DIAGNOSIS — E890 Postprocedural hypothyroidism: Secondary | ICD-10-CM

## 2020-06-05 NOTE — Telephone Encounter (Signed)
Please advise pt to have labs done. Rx as requested by pharmacy for Armour sent.

## 2020-06-05 NOTE — Telephone Encounter (Signed)
She said she will get them done

## 2020-06-13 ENCOUNTER — Encounter (INDEPENDENT_AMBULATORY_CARE_PROVIDER_SITE_OTHER): Payer: Self-pay

## 2020-06-14 ENCOUNTER — Encounter (INDEPENDENT_AMBULATORY_CARE_PROVIDER_SITE_OTHER): Payer: Self-pay

## 2020-07-13 ENCOUNTER — Encounter (INDEPENDENT_AMBULATORY_CARE_PROVIDER_SITE_OTHER): Payer: Self-pay

## 2020-07-14 ENCOUNTER — Encounter (INDEPENDENT_AMBULATORY_CARE_PROVIDER_SITE_OTHER): Payer: Self-pay

## 2020-08-03 ENCOUNTER — Ambulatory Visit (INDEPENDENT_AMBULATORY_CARE_PROVIDER_SITE_OTHER): Payer: 59 | Admitting: Family Medicine

## 2020-08-03 ENCOUNTER — Encounter (INDEPENDENT_AMBULATORY_CARE_PROVIDER_SITE_OTHER): Payer: Self-pay | Admitting: Family Medicine

## 2020-08-03 VITALS — BP 160/81 | HR 49 | Temp 97.2°F | Resp 14 | Ht 62.52 in | Wt 137.6 lb

## 2020-08-03 DIAGNOSIS — E890 Postprocedural hypothyroidism: Secondary | ICD-10-CM

## 2020-08-03 DIAGNOSIS — G8929 Other chronic pain: Secondary | ICD-10-CM

## 2020-08-03 DIAGNOSIS — M25511 Pain in right shoulder: Secondary | ICD-10-CM

## 2020-08-03 DIAGNOSIS — M67911 Unspecified disorder of synovium and tendon, right shoulder: Secondary | ICD-10-CM

## 2020-08-03 DIAGNOSIS — I10 Essential (primary) hypertension: Secondary | ICD-10-CM

## 2020-08-03 DIAGNOSIS — R079 Chest pain, unspecified: Secondary | ICD-10-CM

## 2020-08-03 LAB — HEMOLYSIS INDEX: Hemolysis Index: 4 Index (ref 0–24)

## 2020-08-03 LAB — LIPID PANEL
Cholesterol / HDL Ratio: 3 Index
Cholesterol: 96 mg/dL (ref 0–199)
HDL: 32 mg/dL — ABNORMAL LOW (ref 40–9999)
LDL Calculated: 57 mg/dL (ref 0–99)
Triglycerides: 36 mg/dL (ref 34–149)
VLDL Calculated: 7 mg/dL — ABNORMAL LOW (ref 10–40)

## 2020-08-03 LAB — COMPREHENSIVE METABOLIC PANEL
ALT: 19 U/L (ref 0–55)
AST (SGOT): 26 U/L (ref 5–34)
Albumin/Globulin Ratio: 1.5 (ref 0.9–2.2)
Albumin: 3.7 g/dL (ref 3.5–5.0)
Alkaline Phosphatase: 125 U/L — ABNORMAL HIGH (ref 37–117)
Anion Gap: 7 (ref 5.0–15.0)
BUN: 15 mg/dL (ref 7.0–19.0)
Bilirubin, Total: 0.8 mg/dL (ref 0.2–1.2)
CO2: 27 mEq/L (ref 21–29)
Calcium: 8.6 mg/dL (ref 8.5–10.5)
Chloride: 109 mEq/L (ref 100–111)
Creatinine: 0.9 mg/dL (ref 0.4–1.5)
Globulin: 2.5 g/dL (ref 2.0–3.6)
Glucose: 85 mg/dL (ref 70–100)
Potassium: 3.5 mEq/L (ref 3.5–5.1)
Protein, Total: 6.2 g/dL (ref 6.0–8.3)
Sodium: 143 mEq/L (ref 136–145)

## 2020-08-03 LAB — GFR: EGFR: >60

## 2020-08-03 LAB — HEMOGLOBIN A1C
Average Estimated Glucose: 91.1 mg/dL
Hemoglobin A1C: 4.8 % (ref 4.6–5.9)

## 2020-08-03 LAB — T3, FREE: T3, Free: 2.91 pg/mL (ref 1.71–3.71)

## 2020-08-03 LAB — TSH: TSH: 7.19 u[IU]/mL — ABNORMAL HIGH (ref 0.35–4.94)

## 2020-08-03 LAB — T4, FREE: T4 Free: 0.7 ng/dL (ref 0.70–1.48)

## 2020-08-03 LAB — VITAMIN D,25 OH,TOTAL: Vitamin D, 25 OH, Total: 38 ng/mL (ref 30–100)

## 2020-08-03 NOTE — Progress Notes (Signed)
Subjective:    Date: 08/03/2020 9:33 AM   Patient ID: Tracy [name] is a 55 y.o. female.    Chief Complaint   Patient presents with    pain     Shoulder pain about a month, back pain on and off    Shortness of Breath     About a week     HPI    Pt c/o recurrent right shoulder pain. She was seen for this in 11/2019, she later saw orthopedics and reports had an xray and then an injection that helped for a few months, but started to bother her again for the past month.  There is pain with rasing the arm, similar to before.  No new injury.    Pt shortness of breath within the last 1.5 weeks. Chest feels heavy and like pressure.  No acute URI symptoms. Reports occasional palpitations.   Lab Results   Component Value Date    CHOL 98 04/07/2019    CHOL 102 01/20/2018     Lab Results   Component Value Date    HDL 32 (L) 04/07/2019    HDL 38 (L) 01/20/2018     Lab Results   Component Value Date    LDL 59 04/07/2019    LDL 57 01/20/2018     Lab Results   Component Value Date    TRIG 34 04/07/2019    TRIG 37 01/20/2018     The ASCVD Risk score (Arnett DK, et al., 2019) failed to calculate for the following reasons:    The valid total cholesterol range is 130 to 320 mg/dL    Lab Results   Component Value Date    HGBA1C 4.5 (L) 04/07/2019    HGBA1C 4.7 01/20/2018    HGBA1C 4.5 (L) 08/15/2017       HTN - pt reports her home BP is runnning 120-130s/80s    No problems updated.    Patient Active Problem List   Diagnosis    Migraine    History of hypertension    Abdominal cyst    Hiatal hernia    Hypokalemia    S/P gastric bypass    Fatty liver    Postsurgical hypothyroidism    Tubular adenoma of colon    Allergic rhinitis    Sleep difficulties    Difficulty concentrating    Forgetfulness    Hx of migraine headaches    Occipital headache    Frontal headache    Hx of concussion    White matter abnormality on MRI of brain    Hx of sleep apnea    Irritable bowel syndrome with diarrhea    Chronic diarrhea     Patient Care  Team:  Jules Husbands, DO as PCP - General (Family Medicine)  Einar Grad, MD as Consulting Physician (Gastroenterology)  Zerita Boers, MD as Consulting Physician (Endocrinology, Diabetes and Metabolism)  Campbell Lerner, MD as Consulting Physician (Neurology)  Frankey Shown Gerome Apley, MD as Consulting Physician (Neurology)  Angelene Giovanni, MD as Consulting Physician (Vascular Surgery)    Immunization History   Administered Date(s) Administered    COVID-19 mRNA vaccine 12 years and above [employer] Cap) 30 mcg/0.3 mL 04/16/2019, 05/14/2019    Pneumococcal 23 valent 07/14/2019    Tdap 01/07/2011    Zoster [facility]) Vaccine Recombinant 07/14/2019     Current Outpatient Medications   Medication Sig Dispense Refill    albuterol sulfate HFA (PROVENTIL) 108 (90 Base) MCG/ACT inhaler Inhale 2 puffs  into the lungs every 4 (four) hours as needed for Wheezing or Shortness of Breath (cough) 1 Inhaler 0    Armour Thyroid 60 MG tablet TAKE 1 TABLET(60 MG) BY MOUTH DAILY 30 tablet 1    Beclomethasone Dipropionate (Qnasl) 80 MCG/ACT Aero Soln 2 sprays by Nasal route daily 3 Inhaler 1    Biotin 5 MG/ML Liquid Take by mouth daily         butalbital-acetaminophen-caffeine (FIORICET) 50-325-40 MG per tablet Take 1 tablet by mouth every 4 (four) hours as needed for Headaches 15 tablet 0    calcium carbonate 1500 (600 Ca) MG Tab tablet Take 600 mg by mouth      calcium-vitamin D (OSCAL-500 + D) 500-200 MG-UNIT per tablet Take 1 tablet by mouth daily      cetirizine (ZyrTEC Allergy) 10 MG tablet Take 1 tablet (10 mg total) by mouth daily 90 tablet 0    Cyanocobalamin 500 MCG/0.1ML Solution 1 spray via nares weekly 1 Bottle 3    diclofenac Sodium (VOLTAREN) 1 % Gel topical gel Apply 4 g to affected area 4 times daily as needed.  Max: 16 g/joint/day.  Do not apply to broken, inflamed, or infected skin. 100 g 1    montelukast (SINGULAIR) 10 MG tablet TAKE 1 TABLET(10 MG) BY MOUTH EVERY NIGHT 90 tablet 0    Multiple Vitamins-Minerals  (MULTIVITAMIN WITH MINERALS) tablet Take 1 tablet by mouth daily      Spacer/Aero-Holding Chambers (AEROCHAMBER Z-STAT PLUS/MEDIUM) inhaler Use as instructed 1 each 0    ubrogepant (Ubrelvy) 100 MG tablet Take 1 tablet (100 mg total) by mouth once as needed (migraine) May repeat once after 2 hours.  Max of 200 mg/24 hours. 16 tablet 5    vitamin D (CHOLECALCIFEROL) 25 MCG (1000 UT) tablet Take 1,000 Units by mouth daily       No current facility-administered medications for this visit.     Allergies   Allergen Reactions    Bee Pollen Swelling     "Bee Stings".Marland KitchenMarland KitchenMarland KitchenStuffy nose; "throat swelling"      Vicodin [Hydrocodone-Acetaminophen] Nausea And Vomiting       Past Medical History:   Diagnosis Date    Anemia     Arthritis     Carotid stenosis     Diarrhea 12/2016    Since I had my dual switch surgery    Fatty liver 2017    GERD (gastroesophageal reflux disease)     Hepatic cirrhosis     Hiatal hernia     Hyperthyroidism 2013    Thyroid removed    Sleep apnea     not using CPAP machine    Vitamin D deficiency      Past Surgical History:   Procedure Laterality Date    CHOLECYSTECTOMY  12/2016    COLONOSCOPY, BIOPSY N/A 01/27/2018    Procedure: COLONOSCOPY, BIOPSY;  Surgeon: Shanon Brow, MD;  Location: [facility];  Service: Gastroenterology;  Laterality: N/A;    DUODENAL SWITCH  12/2016    EGD, BIOPSY N/A 01/27/2018    Procedure: EGD, BIOPSY;  Surgeon: Shanon Brow, MD;  Location: [facility];  Service: Gastroenterology;  Laterality: N/A;    GASTRECTOMY  09/2013    HYSTERECTOMY  2015    Partial.    LAPAROSCOPIC REVISION SLEEVE GASTRECTOMY TO GASTRIC BYPASS      LAPAROSCOPIC, GASTRECTOMY SLEEVE      SALPINGO OOPHERECTOMY Bilateral     THYROIDECTOMY  2013  Family History   Problem Relation Age of Onset    Cancer Mother         hx ovarian    Hypertension Mother     Inflammatory bowel disease Mother     Heart failure Mother     Angina Mother     Migraines Mother     Asthma Father     Diabetes  Father     Hyperlipidemia Father     Hypertension Father     Cancer Father         Currently has cancer    Crohn's disease Father     Asthma Sister     Diabetes Sister     Hyperlipidemia Sister     Hypertension Sister     Early death Sister     Cancer Brother         hx of stomach    Hyperlipidemia Brother     Hypertension Brother     Migraines Brother     Learning disabilities Brother         Deaf    Breast cancer Paternal Aunt         died of     Breast cancer Other         maternal cousin-- survior    Obesity Son        Social History     Socioeconomic History    Marital status: Significant Other   Tobacco Use    Smoking status: Never    Smokeless tobacco: Never    Tobacco comments:     None   Vaping Use    Vaping Use: Never used   Substance and Sexual Activity    Alcohol use: No    Drug use: Not Currently    Sexual activity: Not Currently     Partners: Male     Birth control/protection: Post-menopausal, Rhythm   Other Topics Concern    Dietary supplements / vitamins Yes     Comment: Biotin B12 multi vitamins D3    Anesthesia problems No    Blood thinners No    Pregnant No    Future Children No    Number of Pregnancies? No    Number of children Yes     Comment: 1    Miscarriages / Abortions? No    Eats large amounts No    Excessive Sweets No    Skips meals No    Eats excessive starches No    Snacks or grazes No    Emotional eater No    Eats fried food No    Eats fast food No    Diet Center Yes    Doylene Bode No    LA Weight Loss No    Nutri-System No    Opti-Fast / Medi-Fast Yes    Overeaters Anonymous No    [facility] Yes    TOPS No    Weight Watchers Yes    Atkins Yes    Binging / Purging No    Calorie Counting Yes    Fasting Yes    High Protein Yes    Low Carb Yes    Low Fat No    [facility] Diet No    Slim Fast Yes    [place] No    Stationary cycle or treadmill Yes    Gym/fitness Classes Yes    Home exercise/video Yes    Swimming Yes    Weight training Yes  Walking or running Yes     Hospitalization No    Hypnosis No    Physical therapy Yes    Psychological therapy Yes    Residential program No    Acutrim No    [place] No    Contrave No    Dexatrim Yes    Diethylpropion No    Fastin No    Fen - Phen Yes    Ionamin / Adipex No    Phentermine Yes    Qsymia No    Prozac Yes    Saxenda No    Topamax No    Wellbutrin No    Xenical (Orlistat, Alli) No    Other Med No    No impairment Yes    Walks with cane/crutch Yes    Requires a wheelchair No    Bedridden No    Are you currently being treated for depression? No    Do you snore? No    Are you receiving any medical or psychological services? No    Do you ever wake up at night gasping for breath? No    Do you have or have you been treated for an eating disorder? No    Anyone ever told you that you stop breathing while asleep? No    Do you exercise regularly? Yes    Have you or family member ever have trouble with anesthesia? Yes     Social Determinants of Health     Financial Resource Strain: High Risk    Difficulty of Paying Living Expenses: Hard   Food Insecurity: No Food Insecurity    Worried About Programme researcher, broadcasting/film/video in the Last Year: Never true    Ran Out of Food in the Last Year: Never true   Transportation Needs: No Transportation Needs    Lack of Transportation (Medical): No    Lack of Transportation (Non-Medical): No   Physical Activity: Sufficiently Active    Days of Exercise per Week: 5 days    Minutes of Exercise per Session: 40 min   Stress: Stress Concern Present    Feeling of Stress : Rather much   Social Connections: Moderately Integrated    Frequency of Communication with Friends and Family: More than three times a week    Frequency of Social Gatherings with Friends and Family: Once a week    Attends Religious Services: More than 4 times per year    Active Member of [employer] or Organizations: Yes    Attends Engineer, structural: More than 4 times per year    Marital Status: Never married   Catering manager Violence: Not At Risk     Fear of Current or Ex-Partner: No    Emotionally Abused: No    Physically Abused: No    Sexually Abused: No   Housing Stability: High Risk    Unable to Pay for Housing in the Last Year: Yes    Number of Places Lived in the Last Year: 1    Unstable Housing in the Last Year: No     The following portions of the patient's history were reviewed and updated as appropriate: allergies, current medications, past family history, past medical history, past social history, past surgical history and problem list.    Review of Systems     Objective:   BP 160/81 (BP Site: Right arm, Patient Position: Sitting, Cuff Size: Medium)   Pulse (!) 49   Temp 97.2 F (36.2 C) (Temporal)   Resp 14  Ht 1.588 m (5' 2.52")   Wt 62.4 kg (137 lb 9.6 oz)   LMP  (LMP Unknown)   SpO2 99%   BMI 24.75 kg/m   Wt Readings from Last 3 Encounters:   08/03/20 62.4 kg (137 lb 9.6 oz)   05/17/20 61.5 kg (135 lb 9.6 oz)   03/29/20 63 kg (139 lb)     Physical Exam       Assessment/Plan:       1. Tendinopathy of right shoulder  Follup with Dr. Ninfa Linden consider repeat CS injection  Consider PT  - Ambulatory referral to Orthopedic Surgery    2. Chronic right shoulder pain  - Ambulatory referral to Orthopedic Surgery    3. Postsurgical hypothyroidism  - Insulin-like growth factor  - Comprehensive metabolic panel  - Vitamin D,25 OH, Total  - TSH  - T4, free  - T3, free    4. Primary hypertension  Lower at home, suspect reactive due to pain   Continue to monitor    5. Chest pain, unspecified type  - Referral to Cardiology - EXTERNAL  - Lipid panel  - Hemoglobin A1C      Return for Preventive Wellness Visit.    Jules Husbands, DO

## 2020-08-03 NOTE — Progress Notes (Signed)
Have you seen any specialists/other providers since your last visit with [place]?    Yes    Arm preference verified?   Yes    The patient is due for statin use, advance directive, annual exam

## 2020-08-07 LAB — INSULIN-LIKE GROWTH FACTOR
IGF-1, Z Score: -0.84 SD (ref ?–2.0)
Insulin-like Growth Factor 1: 72 ng/mL (ref 40–217)

## 2020-08-13 ENCOUNTER — Encounter (INDEPENDENT_AMBULATORY_CARE_PROVIDER_SITE_OTHER): Payer: Self-pay

## 2020-08-14 ENCOUNTER — Encounter (INDEPENDENT_AMBULATORY_CARE_PROVIDER_SITE_OTHER): Payer: Self-pay

## 2020-08-15 ENCOUNTER — Other Ambulatory Visit (INDEPENDENT_AMBULATORY_CARE_PROVIDER_SITE_OTHER): Payer: Self-pay | Admitting: "Endocrinology

## 2020-08-15 DIAGNOSIS — E890 Postprocedural hypothyroidism: Secondary | ICD-10-CM

## 2020-08-16 NOTE — Telephone Encounter (Signed)
RN called and spoke to the pt. Pt stated that she is taking her Armour thyroid 60mg  everyday. However, she is taking her biotin and multivitamin about 1-2 hours away of her thyroid medication. Pt stated that she only has 3 more pills of Armour thyroid and needs refill urgently. Please advise. Pt could be responded through [facility] [employer].      Pt also complained of having problem with her neck, she sates feeling like something is stuck in her throat for few minutes and wanted to get it checked. Scheduled appointment for 08/21/20 at 2:40 pm.

## 2020-08-16 NOTE — Telephone Encounter (Signed)
Please check with pt, I have not heard from her after my message to her re lab results:    "Hi Tracy [name],   The thyroid hormone level is a bit low.  Have you missed any dosages of the Armour in the past month that may explain the low thyroid levels or do you also take a biotin supplement which may affect the thyroid levels?  If you have been taking it regularly, the dosage of the armour will need to be increased by 15mg  to 75mg  daily. "    If we increase the dosage, please advise for patient to check labs in 6 weeks after making the change and also for her to schedule a follow up viist as per my visit note. Thanks

## 2020-08-17 MED ORDER — ARMOUR THYROID 60 MG PO TABS
60.0000 mg | ORAL_TABLET | Freq: Every day | ORAL | 1 refills | Status: DC
Start: 2020-08-17 — End: 2021-02-21

## 2020-08-17 NOTE — Telephone Encounter (Signed)
RN called and informed pt.

## 2020-08-17 NOTE — Telephone Encounter (Signed)
Since TSH is possible affected by biotin I sent Rx for Amour 60mg  daily and we will discuss further at visit next week.

## 2020-08-17 NOTE — Addendum Note (Signed)
Addended byZerita Boers on: 08/17/2020 01:12 AM     Modules accepted: Orders

## 2020-08-21 ENCOUNTER — Ambulatory Visit (INDEPENDENT_AMBULATORY_CARE_PROVIDER_SITE_OTHER): Payer: 59 | Admitting: "Endocrinology

## 2020-08-21 VITALS — BP 149/90 | HR 54 | Temp 98.7°F | Wt 137.4 lb

## 2020-08-21 DIAGNOSIS — E890 Postprocedural hypothyroidism: Secondary | ICD-10-CM

## 2020-08-21 DIAGNOSIS — M810 Age-related osteoporosis without current pathological fracture: Secondary | ICD-10-CM | POA: Insufficient documentation

## 2020-08-21 DIAGNOSIS — R0789 Other chest pain: Secondary | ICD-10-CM

## 2020-08-21 NOTE — Progress Notes (Signed)
Chief Complaint:  Chief Complaint   Patient presents with    Hypothyroidism       HPI:  Tracy [name] is a 55 y.o. female with postsurgical hypothyroidism, chronic sinusitis, s/p sleeve gastrectomy (2014, revision in 2018), who returns for follow up.    Tracy [name] is s/p total thyroidectomy in 2013 (benign).      She reports chronic mucus buildup like she had prior to having thyroidectomy.  She is concerned about re-growth of goiter which has occurred to other family members.  She c/o chest wall discomfort since covid infection.  She has seen her PCP for this.     Patient is taking Armour thyroid 60mg  daily.   Patient is taking the following supplements: Biotin, Calcium, Multivitamin, Vitamin D3 1000 IU, vitamin B12 (weekly nasal spray), seaweed supplement.      Labs:  Component      Latest Ref Rng & Units 07/05/2019   TSH  0.35 - 4.94 uIU/mL 4.79   T4 Free 0.70 - 1.48 ng/dL 1.61   T3 Free  0.96 - 3.71 pg/mL 2.10     Component      Latest Ref Rng & Units 08/03/2020   Vitamin D 25-OH Total      30 - 100 ng/mL 38   TSH    0.35 - 4.94 uIU/mL 7.19 (H)   T4 Free   0.70 - 1.48 ng/dL 0.45   T3 Free  4.09 - 3.71 pg/mL 2.91       Imaging:  Thyroid ultrasound 03/22/20 [place]: s/p thyroidectomy, no abnormalities    Problem List:  Patient Active Problem List   Diagnosis    Migraine    History of hypertension    Abdominal cyst    Hiatal hernia    Hypokalemia    S/P gastric bypass    Fatty liver    Postsurgical hypothyroidism    Tubular adenoma of colon    Allergic rhinitis    Sleep difficulties    Difficulty concentrating    Forgetfulness    Hx of migraine headaches    Occipital headache    Frontal headache    Hx of concussion    White matter abnormality on MRI of brain    Hx of sleep apnea    Irritable bowel syndrome with diarrhea    Chronic diarrhea    Preventative health care       Current Medications:  Current Outpatient Medications on File Prior to Visit   Medication Sig Dispense Refill    albuterol sulfate HFA  (PROVENTIL) 108 (90 Base) MCG/ACT inhaler Inhale 2 puffs into the lungs every 4 (four) hours as needed for Wheezing or Shortness of Breath (cough) 1 Inhaler 0    Armour Thyroid 60 MG tablet Take 1 tablet (60 mg total) by mouth daily 90 tablet 1    Beclomethasone Dipropionate (Qnasl) 80 MCG/ACT Aero Soln 2 sprays by Nasal route daily 3 Inhaler 1    Biotin 5 MG/ML Liquid Take by mouth daily         butalbital-acetaminophen-caffeine (FIORICET) 50-325-40 MG per tablet Take 1 tablet by mouth every 4 (four) hours as needed for Headaches 15 tablet 0    calcium carbonate 1500 (600 Ca) MG Tab tablet Take 600 mg by mouth      cetirizine (ZyrTEC Allergy) 10 MG tablet Take 1 tablet (10 mg total) by mouth daily 90 tablet 0    Cyanocobalamin 500 MCG/0.1ML Solution 1 spray via nares weekly (Patient taking differently:  Pt stated currently on oral Vitamin B12 daily) 1 Bottle 3    diclofenac Sodium (VOLTAREN) 1 % Gel topical gel Apply 4 g to affected area 4 times daily as needed.  Max: 16 g/joint/day.  Do not apply to broken, inflamed, or infected skin. 100 g 1    montelukast (SINGULAIR) 10 MG tablet TAKE 1 TABLET(10 MG) BY MOUTH EVERY NIGHT 90 tablet 0    Multiple Vitamins-Minerals (MULTIVITAMIN WITH MINERALS) tablet Take 1 tablet by mouth daily      Spacer/Aero-Holding Chambers (AEROCHAMBER Z-STAT PLUS/MEDIUM) inhaler Use as instructed 1 each 0    ubrogepant (Ubrelvy) 100 MG tablet Take 1 tablet (100 mg total) by mouth once as needed (migraine) May repeat once after 2 hours.  Max of 200 mg/24 hours. (Patient not taking: Reported on 09/18/2020) 16 tablet 5    vitamin D (CHOLECALCIFEROL) 25 MCG (1000 UT) tablet Take 1,000 Units by mouth daily       No current facility-administered medications on file prior to visit.       Allergies:  Allergies   Allergen Reactions    Bee Pollen Swelling     "Bee Stings".Marland KitchenMarland KitchenMarland KitchenStuffy nose; "throat swelling"      Vicodin [Hydrocodone-Acetaminophen] Nausea And Vomiting       Past Medical  History:  Past Medical History:   Diagnosis Date    Anemia     Arthritis     Carotid stenosis     Diarrhea 12/2016    Since I had my dual switch surgery    Fatty liver 2017    GERD (gastroesophageal reflux disease)     Hepatic cirrhosis     Hiatal hernia     Hyperthyroidism 2013    Thyroid removed    Sleep apnea     not using CPAP machine    Vitamin D deficiency        Past Surgical History:  Past Surgical History:   Procedure Laterality Date    CHOLECYSTECTOMY  12/2016    COLONOSCOPY, BIOPSY N/A 01/27/2018    Procedure: COLONOSCOPY, BIOPSY;  Surgeon: Shanon Brow, MD;  Location: [facility];  Service: Gastroenterology;  Laterality: N/A;    DUODENAL SWITCH  12/2016    EGD, BIOPSY N/A 01/27/2018    Procedure: EGD, BIOPSY;  Surgeon: Shanon Brow, MD;  Location: [facility];  Service: Gastroenterology;  Laterality: N/A;    GASTRECTOMY  09/2013    HYSTERECTOMY  2015    Partial.    LAPAROSCOPIC REVISION SLEEVE GASTRECTOMY TO GASTRIC BYPASS      LAPAROSCOPIC, GASTRECTOMY SLEEVE      SALPINGO OOPHERECTOMY Bilateral     THYROIDECTOMY  2013       Family History:  Family History   Problem Relation Age of Onset    Cancer Mother         hx ovarian    Hypertension Mother     Inflammatory bowel disease Mother     Heart failure Mother     Angina Mother     Migraines Mother     Asthma Father     Diabetes Father     Hyperlipidemia Father     Hypertension Father     Cancer Father         Currently has cancer    Crohn's disease Father     Asthma Sister     Diabetes Sister     Hyperlipidemia Sister     Hypertension Sister     Early death  Sister     Cancer Brother         hx of stomach    Hyperlipidemia Brother     Hypertension Brother     Migraines Brother     Learning disabilities Brother         Deaf    Breast cancer Paternal Aunt         died of     Breast cancer Other         maternal cousin-- survior    Obesity Son        Social History:  Social History     Socioeconomic History    Marital status:  Significant Other   Tobacco Use    Smoking status: Never    Smokeless tobacco: Never    Tobacco comments:     None   Vaping Use    Vaping Use: Never used   Substance and Sexual Activity    Alcohol use: No    Drug use: Not Currently    Sexual activity: Not Currently     Partners: Male     Birth control/protection: Post-menopausal, Rhythm   Other Topics Concern    Dietary supplements / vitamins Yes     Comment: Biotin B12 multi vitamins D3    Anesthesia problems No    Blood thinners No    Pregnant No    Future Children No    Number of Pregnancies? No    Number of children Yes     Comment: 1    Miscarriages / Abortions? No    Eats large amounts No    Excessive Sweets No    Skips meals No    Eats excessive starches No    Snacks or grazes No    Emotional eater No    Eats fried food No    Eats fast food No    Diet Center Yes    Doylene Bode No    [facility] Weight Loss No    Nutri-System No    Opti-Fast / Medi-Fast Yes    Overeaters Anonymous No    [facility] Yes    TOPS No    Weight Watchers Yes    Atkins Yes    Binging / Purging No    Calorie Counting Yes    Fasting Yes    High Protein Yes    Low Carb Yes    Low Fat No    [facility] Diet No    Slim Fast Yes    [place] No    Stationary cycle or treadmill Yes    Gym/fitness Classes Yes    Home exercise/video Yes    Swimming Yes    Weight training Yes    Walking or running Yes    Hospitalization No    Hypnosis No    Physical therapy Yes    Psychological therapy Yes    Residential program No    Acutrim No    [place] No    Contrave No    Dexatrim Yes    Diethylpropion No    Fastin No    Fen - Phen Yes    Ionamin / Adipex No    Phentermine Yes    Qsymia No    Prozac Yes    Saxenda No    Topamax No    Wellbutrin No    Xenical (Orlistat, Alli) No    Other Med No    No impairment Yes    Walks with  cane/crutch Yes    Requires a wheelchair No    Bedridden No    Are you currently being treated for depression? No    Do you snore? No    Are you receiving any medical or  psychological services? No    Do you ever wake up at night gasping for breath? No    Do you have or have you been treated for an eating disorder? No    Anyone ever told you that you stop breathing while asleep? No    Do you exercise regularly? Yes    Have you or family member ever have trouble with anesthesia? Yes     Social Determinants of Health     Financial Resource Strain: High Risk    Difficulty of Paying Living Expenses: Hard   Food Insecurity: No Food Insecurity    Worried About Programme researcher, broadcasting/film/video in the Last Year: Never true    Ran Out of Food in the Last Year: Never true   Transportation Needs: No Transportation Needs    Lack of Transportation (Medical): No    Lack of Transportation (Non-Medical): No   Physical Activity: Sufficiently Active    Days of Exercise per Week: 5 days    Minutes of Exercise per Session: 40 min   Stress: Stress Concern Present    Feeling of Stress : Rather much   Social Connections: Moderately Integrated    Frequency of Communication with Friends and Family: More than three times a week    Frequency of Social Gatherings with Friends and Family: Once a week    Attends Religious Services: More than 4 times per year    Active Member of [employer] or Organizations: Yes    Attends Engineer, structural: More than 4 times per year    Marital Status: Never married   Catering manager Violence: Not At Risk    Fear of Current or Ex-Partner: No    Emotionally Abused: No    Physically Abused: No    Sexually Abused: No   Housing Stability: High Risk    Unable to Pay for Housing in the Last Year: Yes    Number of Places Lived in the Last Year: 1    Unstable Housing in the Last Year: No         Visit Vitals  BP 149/90 (BP Site: Right arm, Patient Position: Sitting, Cuff Size: Medium)   Pulse (!) 54   Temp 98.7 F (37.1 C) (Temporal)   Wt 62.3 kg (137 lb 6.4 oz)   LMP  (LMP Unknown)   BMI 24.71 kg/m        BP Readings from Last 3 Encounters:   09/18/20 167/89   08/21/20 149/90   08/03/20  160/81        Wt Readings from Last 4 Encounters:   09/18/20 0926 62.4 kg (137 lb 9.6 oz)   08/21/20 1504 62.3 kg (137 lb 6.4 oz)   08/03/20 0910 62.4 kg (137 lb 9.6 oz)   05/17/20 1442 61.5 kg (135 lb 9.6 oz)        Physical Exam:  GENERAL APPEARANCE: alert, in no acute distress, well developed, well nourished  ORAL CAVITY: normal oropharynx, normal oral mucosa, normal dentition  NECK/THYROID: s/p thyroidectomy, no palpable neck mass or cervical LAD  HEART: S1, S2 normal, no murmurs, regular rate and rhythm  LUNGS: clear to auscultation bilaterally, no wheezes, rales, rhonchi  PSYCHIATRIC: normal mood, appropriate affect  Assessment/Plan:  Riley Papin is a 55 y.o. female with    1. Postoperative hypothyroidism    2. Chest discomfort        1. Postoperative hypothyroidism   - Continue Armour 60mg  daily  - Check TFTs and adjust Armour as needed    2. Chest discomfort Normal ultrasound  - Follow up with PCP  - Check CT chest      Orders Placed This Encounter    CT chest with contrast    T4, free    TSH          There are no discontinued medications.     Return in about 6 months (around 02/21/2021).    Zerita Boers, MD

## 2020-09-04 ENCOUNTER — Telehealth (INDEPENDENT_AMBULATORY_CARE_PROVIDER_SITE_OTHER): Payer: Self-pay

## 2020-09-04 NOTE — Telephone Encounter (Signed)
[place] radiology called to request for RN to call this patients insurance for the PA.     The insurance number is [phone]  Case ID# [id number]    Patient is scheduled for imaging on 08/31.   I was told it's an urgent

## 2020-09-05 ENCOUNTER — Ambulatory Visit
Admission: RE | Admit: 2020-09-05 | Discharge: 2020-09-05 | Disposition: A | Discharge status: Home / Self Care | Payer: 59 | Source: Ambulatory Visit | Attending: "Endocrinology | Admitting: "Endocrinology

## 2020-09-05 ENCOUNTER — Ambulatory Visit (INDEPENDENT_AMBULATORY_CARE_PROVIDER_SITE_OTHER): Payer: 59 | Admitting: Cardiovascular Disease

## 2020-09-05 DIAGNOSIS — R0789 Other chest pain: Secondary | ICD-10-CM | POA: Insufficient documentation

## 2020-09-05 DIAGNOSIS — E890 Postprocedural hypothyroidism: Secondary | ICD-10-CM | POA: Insufficient documentation

## 2020-09-05 LAB — WHOLE BLOOD CREATININE WITH GFR POCT
GFR POCT: >60 mL/min/{1.73_m2} (ref >60)
Whole Blood Creatinine POCT: 0.7 mg/dL (ref 0.5–1.1)

## 2020-09-05 MED ORDER — IOHEXOL 350 MG/ML IV SOLN
75.0000 mL | Freq: Once | INTRAVENOUS | Status: AC | PRN
Start: 2020-09-05 — End: 2020-09-05
  Administered 2020-09-05: 11:00:00 75 mL via INTRAVENOUS

## 2020-09-05 NOTE — Telephone Encounter (Signed)
Please call [place] radiology regarding question about this imaging study. Typically PAs for imaging is done by Radiology department.

## 2020-09-06 ENCOUNTER — Telehealth (INDEPENDENT_AMBULATORY_CARE_PROVIDER_SITE_OTHER): Payer: Self-pay | Admitting: "Endocrinology

## 2020-09-06 NOTE — Telephone Encounter (Signed)
Marchelle Gearing from [employer] dept [phone]) called to let us know that pt ins denied the chest scan yesterday 8/31 that Dr. Rivka Safer requested because there was no chest x-ray showing an abnormal result. Can we do a P2P (peer to peer) she asked?

## 2020-09-06 NOTE — Telephone Encounter (Signed)
RN contacted [employer] Department, informed RN that we would need to contact insurance to schedule this peer-to-peer. Informed RN that imaging was already done yesterday 09/05/20.    Phone: [phone]  Case: [id number]    RN contacted [facility], spoke to nurse [facility]    Need abnormal chest x-ray results. Are looking for this to obtain approval of the CT.  8:15 AM 09/07/20 for peer-to-peer review.  Dr. Markham Jordan will conduct review. Call will come from [phone] number

## 2020-09-06 NOTE — Telephone Encounter (Signed)
Please advise thanks.

## 2020-09-06 NOTE — Telephone Encounter (Signed)
Yes I can do a peer to peer.  They would be able to call my cell phone for that if needed.  Patient should not have had CT scan done if it was not approved yet though.  Please advise martha from financial dept and provide them with my contact number if needed. Thanks

## 2020-09-07 NOTE — Telephone Encounter (Signed)
RN contacted [employer] Department, spoke to     Informed that CT scan received approval. States they received call this morning letting them know that approval was obtained.    No further action is needed at this time.

## 2020-09-07 NOTE — Telephone Encounter (Signed)
Ct approved by peer to peer review, authorization code [id number].  Please notify radiology. Thank you

## 2020-09-14 ENCOUNTER — Encounter (INDEPENDENT_AMBULATORY_CARE_PROVIDER_SITE_OTHER): Payer: Self-pay

## 2020-09-18 ENCOUNTER — Ambulatory Visit (INDEPENDENT_AMBULATORY_CARE_PROVIDER_SITE_OTHER): Payer: 59 | Admitting: Family Medicine

## 2020-09-18 ENCOUNTER — Encounter (INDEPENDENT_AMBULATORY_CARE_PROVIDER_SITE_OTHER): Payer: Self-pay | Admitting: Family Medicine

## 2020-09-18 VITALS — BP 167/89 | HR 47 | Temp 97.2°F | Ht 62.5 in | Wt 137.6 lb

## 2020-09-18 DIAGNOSIS — Z01419 Encounter for gynecological examination (general) (routine) without abnormal findings: Secondary | ICD-10-CM

## 2020-09-18 DIAGNOSIS — Z1231 Encounter for screening mammogram for malignant neoplasm of breast: Secondary | ICD-10-CM

## 2020-09-18 DIAGNOSIS — Z0000 Encounter for general adult medical examination without abnormal findings: Secondary | ICD-10-CM | POA: Insufficient documentation

## 2020-09-18 NOTE — Progress Notes (Signed)
Subjective:    Date: 09/18/2020 10:08 AM   Patient ID: Tracy [name] is a 55 y.o. female.    Chief Complaint   Patient presents with    Annual Exam     fasting     HPI    Pt presents for routine health maintenance visit/physical    Diet: 0 servings fruit/day; 1 serving vegetable per day  Exercise:   Dental: walks  Vision: pt reports last eye exam last year. She will schedule.     Pt states she could not see cardiologist because "insurance would not cover it.  Advised to contact insurance for participating cardiologist.     Pt reports home BP 120s/70s (highest 134/81)      The ASCVD Risk score (Arnett DK, et al., 2019) failed to calculate for the following reasons:    The valid total cholesterol range is 130 to 320 mg/dL  Lab Results   Component Value Date    CHOL 96 08/03/2020    CHOL 98 04/07/2019    CHOL 102 01/20/2018     Lab Results   Component Value Date    HDL 32 (L) 08/03/2020    HDL 32 (L) 04/07/2019    HDL 38 (L) 01/20/2018     Lab Results   Component Value Date    LDL 57 08/03/2020    LDL 59 04/07/2019    LDL 57 01/20/2018     Lab Results   Component Value Date    TRIG 36 08/03/2020    TRIG 34 04/07/2019    TRIG 37 01/20/2018       Patient Active Problem List    Diagnosis Date Noted    Preventative health care 09/18/2020     Colonoscopy 01/2018, 2 polyps found,  w/ Dr. Foy [place], advised to repeat in 5 years    09/18/2020- pt reports last pap 2020.  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  Postsurgical for goiter.  Lab Results   Component Value Date    TSH 7.53 (H) 01/20/2018       Hypothyroid but per pt was advised to remain on same medication. Followed by Endo.      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       2018    Lab Results   Component Value Date    WBC 3.17 01/20/2018    HGB 12.9 01/20/2018    HCT 41.0 01/20/2018    MCV 95.8 01/20/2018    PLT 194 01/20/2018     Lab Results   Component Value Date    CREAT 1.0 01/20/2018    BUN 14.0 01/20/2018    NA 142 01/20/2018    K 4.1 01/20/2018    CL 107 01/20/2018    CO2 27 01/20/2018  Lab Results   Component Value Date    B12 715 01/20/2018     Lab Results   Component Value Date    VITD 57 01/20/2018     Lab Results   Component Value Date    CA 9.5 01/20/2018             Migraine 08/14/2017    History of hypertension 08/14/2017       BP Readings from Last 3 Encounters:   02/16/18 118/68   01/27/18 (!) 165/94   01/20/18 146/90           Abdominal cyst 08/14/2017    Hiatal hernia 08/14/2017    Hypokalemia 08/14/2017       Lab Results   Component Value Date    CREAT 1.0 01/20/2018    BUN 14.0 01/20/2018    NA 142 01/20/2018    K 4.1 01/20/2018    CL 107 01/20/2018    CO2 27 01/20/2018           Fatty liver 01/07/2015     CT abdomen 08/2017 showed mild fatty infiltration.  Lab Results   Component Value Date    ALT 31 01/20/2018    AST 45 (H) 01/20/2018    ALKPHOS 115 (H) 01/20/2018    BILITOTAL 1.5 (H) 01/20/2018           Allergic rhinitis 09/14/2012     C/o nasal congestion to pollen.  Relieved with Qnsal. Flonase, Nasonex ineffective.       Patient Care Team:  Jules Husbands, DO as PCP - General (Family Medicine)  Einar Grad, MD as Consulting Physician (Gastroenterology)  Zerita Boers, MD as Consulting Physician (Endocrinology, Diabetes and Metabolism)  Campbell Lerner, MD as Consulting Physician (Neurology)  Verlene Mayer, MD as Consulting  Physician (Neurology)  Angelene Giovanni, MD as Consulting Physician (Vascular Surgery)    Immunization History   Administered Date(s) Administered    COVID-19 mRNA vaccine 12 years and above [employer] Cap) 30 mcg/0.3 mL 04/16/2019, 05/14/2019    Pneumococcal 23 valent 07/14/2019    Tdap 01/07/2011    Zoster [facility]) Vaccine Recombinant 07/14/2019     Current Outpatient Medications   Medication Sig Dispense Refill    albuterol sulfate HFA (PROVENTIL) 108 (90 Base) MCG/ACT inhaler Inhale 2 puffs into the lungs every 4 (four) hours as needed for Wheezing or Shortness of Breath (cough) 1 Inhaler 0    Armour Thyroid 60 MG tablet Take 1 tablet (60 mg total) by mouth daily 90 tablet 1    Beclomethasone Dipropionate (Qnasl) 80 MCG/ACT Aero Soln 2 sprays by Nasal route daily 3 Inhaler 1    Biotin 5 MG/ML Liquid Take by mouth daily         butalbital-acetaminophen-caffeine (FIORICET) 50-325-40 MG per tablet Take 1 tablet by mouth every 4 (four) hours as needed for Headaches 15 tablet 0    calcium carbonate 1500 (600 Ca) MG Tab tablet Take 600 mg by mouth      cetirizine (ZyrTEC Allergy) 10 MG tablet Take 1 tablet (10 mg total) by mouth daily 90 tablet 0    Cyanocobalamin 500 MCG/0.1ML Solution 1 spray via nares weekly (Patient taking differently: Pt stated currently on oral Vitamin B12 daily) 1 Bottle 3    diclofenac Sodium (VOLTAREN) 1 % Gel topical gel Apply 4 g to affected area 4 times daily as needed.  Max: 16 g/joint/day.  Do not apply to broken, inflamed, or infected skin. 100  g 1    montelukast (SINGULAIR) 10 MG tablet TAKE 1 TABLET(10 MG) BY MOUTH EVERY NIGHT 90 tablet 0    Multiple Vitamins-Minerals (MULTIVITAMIN WITH MINERALS) tablet Take 1 tablet by mouth daily      vitamin D (CHOLECALCIFEROL) 25 MCG (1000 UT) tablet Take 1,000 Units by mouth daily      Spacer/Aero-Holding Chambers (AEROCHAMBER Z-STAT PLUS/MEDIUM) inhaler Use as instructed 1 each 0    ubrogepant (Ubrelvy) 100 MG tablet Take 1 tablet  (100 mg total) by mouth once as needed (migraine) May repeat once after 2 hours.  Max of 200 mg/24 hours. (Patient not taking: Reported on 09/18/2020) 16 tablet 5     No current facility-administered medications for this visit.     Allergies   Allergen Reactions    Bee Pollen Swelling     "Bee Stings".Marland KitchenMarland KitchenMarland KitchenStuffy nose; "throat swelling"      Vicodin [Hydrocodone-Acetaminophen] Nausea And Vomiting       Past Medical History:   Diagnosis Date    Anemia     Arthritis     Carotid stenosis     Diarrhea 12/2016    Since I had my dual switch surgery    Fatty liver 2017    GERD (gastroesophageal reflux disease)     Hepatic cirrhosis     Hiatal hernia     Hyperthyroidism 2013    Thyroid removed    Sleep apnea     not using CPAP machine    Vitamin D deficiency      Past Surgical History:   Procedure Laterality Date    CHOLECYSTECTOMY  12/2016    COLONOSCOPY, BIOPSY N/A 01/27/2018    Procedure: COLONOSCOPY, BIOPSY;  Surgeon: Shanon Brow, MD;  Location: [facility];  Service: Gastroenterology;  Laterality: N/A;    DUODENAL SWITCH  12/2016    EGD, BIOPSY N/A 01/27/2018    Procedure: EGD, BIOPSY;  Surgeon: Shanon Brow, MD;  Location: [facility];  Service: Gastroenterology;  Laterality: N/A;    GASTRECTOMY  09/2013    HYSTERECTOMY  2015    Partial.    LAPAROSCOPIC REVISION SLEEVE GASTRECTOMY TO GASTRIC BYPASS      LAPAROSCOPIC, GASTRECTOMY SLEEVE      SALPINGO OOPHERECTOMY Bilateral     THYROIDECTOMY  2013     Family History   Problem Relation Age of Onset    Cancer Mother         hx ovarian    Hypertension Mother     Inflammatory bowel disease Mother     Heart failure Mother     Angina Mother     Migraines Mother     Asthma Father     Diabetes Father     Hyperlipidemia Father     Hypertension Father     Cancer Father         Currently has cancer    Crohn's disease Father     Asthma Sister     Diabetes Sister     Hyperlipidemia Sister     Hypertension Sister     Early death Sister     Cancer Brother          hx of stomach    Hyperlipidemia Brother     Hypertension Brother     Migraines Brother     Learning disabilities Brother         Deaf    Breast cancer Paternal Aunt         died of  Breast cancer Other         maternal cousin-- survior    Obesity Son      Social History     Socioeconomic History    Marital status: Significant Other   Tobacco Use    Smoking status: Never    Smokeless tobacco: Never    Tobacco comments:     None   Vaping Use    Vaping Use: Never used   Substance and Sexual Activity    Alcohol use: No    Drug use: Not Currently    Sexual activity: Not Currently     Partners: Male     Birth control/protection: Post-menopausal, Rhythm   Other Topics Concern    Dietary supplements / vitamins Yes     Comment: Biotin B12 multi vitamins D3    Anesthesia problems No    Blood thinners No    Pregnant No    Future Children No    Number of Pregnancies? No    Number of children Yes     Comment: 1    Miscarriages / Abortions? No    Eats large amounts No    Excessive Sweets No    Skips meals No    Eats excessive starches No    Snacks or grazes No    Emotional eater No    Eats fried food No    Eats fast food No    Diet Center Yes    Doylene Bode No    LA Weight Loss No    Nutri-System No    Opti-Fast / Medi-Fast Yes    Overeaters Anonymous No    [facility] Yes    TOPS No    Weight Watchers Yes    Atkins Yes    Binging / Purging No    Calorie Counting Yes    Fasting Yes    High Protein Yes    Low Carb Yes    Low Fat No    [facility] Diet No    Slim Fast Yes    [place] No    Stationary cycle or treadmill Yes    Gym/fitness Classes Yes    Home exercise/video Yes    Swimming Yes    Weight training Yes    Walking or running Yes    Hospitalization No    Hypnosis No    Physical therapy Yes    Psychological therapy Yes    Residential program No    Acutrim No    [place] No    Contrave No    Dexatrim Yes    Diethylpropion No    Fastin No    Fen - Phen Yes    Ionamin / Adipex No    Phentermine Yes     Qsymia No    Prozac Yes    Saxenda No    Topamax No    Wellbutrin No    Xenical (Orlistat, Alli) No    Other Med No    No impairment Yes    Walks with cane/crutch Yes    Requires a wheelchair No    Bedridden No    Are you currently being treated for depression? No    Do you snore? No    Are you receiving any medical or psychological services? No    Do you ever wake up at night gasping for breath? No    Do you have or have you been treated for an eating disorder? No  Anyone ever told you that you stop breathing while asleep? No    Do you exercise regularly? Yes    Have you or family member ever have trouble with anesthesia? Yes     Social Determinants of Health     Financial Resource Strain: High Risk    Difficulty of Paying Living Expenses: Hard   Food Insecurity: No Food Insecurity    Worried About Programme researcher, broadcasting/film/video in the Last Year: Never true    Ran Out of Food in the Last Year: Never true   Transportation Needs: No Transportation Needs    Lack of Transportation (Medical): No    Lack of Transportation (Non-Medical): No   Physical Activity: Sufficiently Active    Days of Exercise per Week: 5 days    Minutes of Exercise per Session: 40 min   Stress: Stress Concern Present    Feeling of Stress : Rather much   Social Connections: Moderately Integrated    Frequency of Communication with Friends and Family: More than three times a week    Frequency of Social Gatherings with Friends and Family: Once a week    Attends Religious Services: More than 4 times per year    Active Member of [employer] or Organizations: Yes    Attends Engineer, structural: More than 4 times per year    Marital Status: Never married   Catering manager Violence: Not At Risk    Fear of Current or Ex-Partner: No    Emotionally Abused: No    Physically Abused: No    Sexually Abused: No   Housing Stability: High Risk    Unable to Pay for Housing in the Last Year: Yes    Number of Places Lived in the Last Year: 1    Unstable Housing in the Last  Year: No     The following portions of the patient's history were reviewed and updated as appropriate: allergies, current medications, past family history, past medical history, past social history, past surgical history and problem list.    Review of Systems   Constitutional:  Positive for fatigue. Negative for chills and fever.   HENT:  Negative for congestion and sore throat.    Eyes:  Negative for pain and visual disturbance.   Respiratory:  Negative for cough and shortness of breath.    Cardiovascular:  Negative for chest pain.   Gastrointestinal:  Negative for abdominal pain.   Endocrine: Negative for cold intolerance and heat intolerance.   Genitourinary:  Negative for difficulty urinating and dysuria.   Musculoskeletal:  Positive for arthralgias. Negative for back pain.   Skin:  Negative for rash.   Neurological:  Positive for headaches. Negative for light-headedness.   Hematological:  Does not bruise/bleed easily.   Psychiatric/Behavioral:  Negative for dysphoric mood. The patient is not nervous/anxious.    All other systems reviewed and are negative.       Objective:   BP 167/89 (BP Site: Right arm, Patient Position: Sitting, Cuff Size: Small)   Pulse (!) 47   Temp 97.2 F (36.2 C)   Ht 1.588 m (5' 2.5")   Wt 62.4 kg (137 lb 9.6 oz)   LMP  (LMP Unknown)   BMI 24.77 kg/m   Wt Readings from Last 3 Encounters:   09/18/20 62.4 kg (137 lb 9.6 oz)   08/21/20 62.3 kg (137 lb 6.4 oz)   08/03/20 62.4 kg (137 lb 9.6 oz)     Physical Exam  Vitals reviewed.  Constitutional:       General: She is not in acute distress.     Appearance: Normal appearance. She is well-developed.   HENT:      Head: Normocephalic and atraumatic.      Right Ear: Hearing, tympanic membrane, ear canal and external ear normal.      Left Ear: Hearing, tympanic membrane, ear canal and external ear normal.      Mouth/Throat:      Pharynx: Uvula midline.   Eyes:      Conjunctiva/sclera: Conjunctivae normal.      Pupils: Pupils are equal,  round, and reactive to light.   Neck:      Thyroid: No thyroid mass or thyromegaly.      Vascular: No carotid bruit.      Trachea: Trachea normal.   Cardiovascular:      Rate and Rhythm: Normal rate and regular rhythm.      Heart sounds: S1 normal and S2 normal. No murmur heard.  Pulmonary:      Effort: Pulmonary effort is normal.      Breath sounds: Normal breath sounds. No decreased breath sounds, wheezing, rhonchi or rales.   Abdominal:      General: Bowel sounds are normal. There is no distension.      Palpations: Abdomen is soft. There is no mass.      Tenderness: There is no abdominal tenderness.   Musculoskeletal:      Cervical back: Neck supple. No edema.   Lymphadenopathy:      Cervical: No cervical adenopathy.      Upper Body:      Right upper body: No supraclavicular adenopathy.      Left upper body: No supraclavicular adenopathy.   Skin:     General: Skin is warm and dry.      Findings: No rash.   Psychiatric:         Behavior: Behavior is cooperative.          Assessment/Plan:       1. Routine general medical examination at a health care facility    2. Preventative health care    3. Encounter for screening mammogram for malignant neoplasm of breast  - Mammo Screening 3D/Tomo Bilateral    4. Routine gynecological examination  - Ambulatory referral to Gynecology    Recommend updated covid booster vaccine    Colon cancer screening: Colonoscopy is up to date.  Reviewed next colonoscopy due date.   Breast cancer screening: Mammogram is due or soon to be due - order entered  Cervical cancer screening: pt reports last pap 2020.  Pt denies prior abnormal pap  Osteoporosis screening: not indicated  Hepatitis C Screening:  Lab Results   Component Value Date    HCVAB Non-Reactive 08/15/2017     HIV screening - Reviewed risks and benefits of testing, the implications of HIV test results, and how test results will be communicated - patient declines screening and had no questions.    Discussed the importance of  regular health exams, dental and eye exams.  Counseled on nutrition, regular aerobic/strenthening exercise, and maintaining/reaching a healthy weight.  BMI reviewed. Counseled on avoiding misuse of alcohol and avoiding/cessation of tobacco and drug use.  Immunizations recommended as per ACIP. Discussed and ordered age/gender appropriate screening tests.   Handout given to patient on health maintenance/preventive care      Return in about 3 months (around 12/18/2020) for chronic disease management, Return sooner as needed.    Trula Ore  Lynelle Smoke, DO

## 2020-09-18 NOTE — Patient Instructions (Signed)
Prevention Guidelines, Women Ages 50 to 64  Screening tests and vaccines are an important part of managing your health. A screening test is done to find possible disorders or diseases in people who don't have any symptoms. The goal is to find a disease early so lifestyle changes can be made and you can be watched more closely to reduce the risk of disease, or to detect it early enough to treat it most effectively. Screening tests are not considered diagnostic, but are used to determine if more testing is needed. Health counseling is essential, too. Below are guidelines for these, for women ages 50 to 64. Keep in mind that screening advice varies among expert groups. Talk with your healthcare provider about which tests are best for you and to make sure you're up to date on what you need.  Screening Who needs it How often   Type 2 diabetes or prediabetes All women beginning at age 45 and women without symptoms at any age who are overweight or obese and have 1 or more additional risk factors for diabetes. At  least every 3 years   Type 2 diabetes or prediabetes All women diagnosed with gestational diabetes Lifelong testing at least every 3 years   Type 2 diabetes All women with prediabetes Every year   Unhealthy alcohol use All women in this age group At routine exams   Blood pressure All women in this age group Yearly checkup if your blood pressure is normal  Normal blood pressure is less than 120/80 mm Hg  If your blood pressure reading is higher than normal, follow the advice of your healthcare provider   Breast cancer All women at average risk in this age group Yearly mammogram should be done until age 54. At age 55, you can switch to every other year or choose to continue yearly.  All women should know how their breasts normally look and feel and know the possible benefits and risks of breast cancer screening with mammograms.   Cervical cancer All women in this age group, except women who have had a complete  hysterectomy Pap test every 3 years or Pap test with human papillomavirus (HPV) test every 5 years   Chlamydia Women who are sexually active and at increased risk for infection At yearly routine exams   Colorectal cancer All women at average risk in this age group Multiple tests are available and are used at different times. Possible tests include:  Colonoscopy every 10 years, or  Flexible sigmoidoscopy every 5 years (or every 10 years with yearly fecal immunochemical test (FIT) stool test), or  CT colonography (virtual colonoscopy) every 5 years, or  Yearly fecal occult blood test, or  Yearly FIT test, or  Stool DNA test, every 1 to 3 years  If you choose a test other than a colonoscopy and have an abnormal test result, you will need to follow up with a colonoscopy. Talk with your healthcare provider about which tests are best for you.  Some people should be screened using a different schedule because of their personal or family health history. Talk with your healthcare provider about your health history.   Depression All women in this age group At routine exams   Gonorrhea Sexually active women at increased risk for infection At yearly routine exams   Hepatitis C Anyone at increased risk; 1 time for those born between 1945 and 1965 At routine exams   High cholesterol or triglycerides All women in this age group who are   at risk for coronary artery disease At least every 5 years; talk with your healthcare provider about your risk   HIV All women At least once during your lifetime; yearly if at high risk   Lung cancer Women between the ages of 50 and 80 who are in fairly good health, are at higher risk for lung cancer, and who:     Currently smoke or have  quit within past 15 years, and     Have a 20-pack-year smoking history (1 pack/day for 20 years or 2 packs/day for 10 years)  Expert groups vary a bit in their advice so talk with your provider Yearly lung cancer screening with a low-dose CT scan (LDCT); talk with  your healthcare provider about your risks and situation   Obesity All women in this age group At yearly routine exams   Osteoporosis Women who are postmenopausal Talk with your healthcare provider   Syphilis Women at increased risk for infection At routine exams; talk with your healthcare provider   Tuberculosis Women at increased risk for infection Talk with your healthcare provider   Vision All women in this age group Talk with your healthcare provider   Vaccine Who needs it How often   Chickenpox (varicella) All women in this age group who have no record of this infection or vaccine 2 doses; the second dose should be given at least 4 weeks after the first dose   Hepatitis A Women at increased risk for infection 2 or 3 doses (depending on the vaccine) given at least 6 months apart; talk with your healthcare provider   Hepatitis B Women at increased risk for infection 2 or 3 doses (depending on the vaccine) ; second dose should be given 1 month after the first dose; if a third dose, it should be given at least 2 months after the second dose and at least 4 months after the first dose; talk with your healthcare provider   Haemophilus influenzae Type B (HIB) Women at increased risk for infection 1 or 3 doses; talk with your healthcare provider   Influenza (flu) All women in this age group Once a year   COVID-19 All women in this age group 1 to 2 doses depending on vaccine; talk with your healthcare provider   Measles, mumps, rubella (MMR) Women in this age group born in 1957 or later who have no record of these infections or vaccines 1 or 2doses   Meningococcal Women at increased risk for infection 1 or more doses; talk with your healthcare provider   Pneumococcal conjugate vaccine (PCV13) and pneumococcal polysaccharide vaccine (PPSV23) Women at increased risk for infection PCV13: 1 dose ages 19 to 64 (protects against 13 types of pneumococcal bacteria)  PPSV23: 1 or 2 doses through age 64(protects against 23 types  of pneumococcal bacteria)  Talk with your healthcare provider   Tetanus/diphtheria/pertussis (Td/Tdap) booster All women in this age group Td every 10 years, or a 1-time dose of Tdap instead of a Td booster after age 18, then Td every 10 years   Recombinant zoster vaccine (RZV) All women ages 50 and older If 2 doses; the 2nd dose is given 2 to 6 months after the first. This is given even if you've had shingles before or had a previous zoster live vaccine.   Counseling Who needs it How often   BRCA gene mutation testing for breast and ovarian cancer susceptibility Women with increased risk for having gene mutation When your risk is known; talk   with your healthcare provider   Breast cancer and chemoprevention Women at high risk for breast cancer When your risk is known; talk with your healthcare provider   Diet and exercise Women who are overweight or obese When diagnosed, and then at routine exams   Sexually transmitted infection prevention Women at increased risk for infection At routine exams; talk with your healthcare provider   Use of daily aspirin Women ages 50 and up who are at high risk for cardiovascular health problems and not at increased risk for bleeding as identified by their healthcare provider When your risk is known; talk with your healthcare provider   Use of tobacco and the health effects it can cause All women in this age group Every exam   [employer] last reviewed this educational content on 06/07/2018   2000-2022 [employer]. All rights reserved. This information is not intended as a substitute for professional medical care. Always follow your healthcare professional's instructions.

## 2020-09-18 NOTE — Progress Notes (Signed)
Have you seen any specialists/other providers since your last visit with [place]?    Yes, edno and recent CT scan     Arm preference verified?   Yes    The patient is due for covid vaccine, advance directive and Statin use

## 2020-09-19 ENCOUNTER — Other Ambulatory Visit (INDEPENDENT_AMBULATORY_CARE_PROVIDER_SITE_OTHER): Payer: Self-pay | Admitting: "Endocrinology

## 2020-09-19 DIAGNOSIS — E890 Postprocedural hypothyroidism: Secondary | ICD-10-CM

## 2020-09-19 LAB — TSH: TSH: 6.88 u[IU]/mL — ABNORMAL HIGH (ref 0.450–4.500)

## 2020-09-19 LAB — T4, FREE: T4, Free: 0.93 ng/dL (ref 0.82–1.77)

## 2020-09-19 MED ORDER — ARMOUR THYROID 15 MG PO TABS
15.0000 mg | ORAL_TABLET | Freq: Every day | ORAL | 1 refills | Status: DC
Start: 2020-09-19 — End: 2020-11-19

## 2020-09-26 ENCOUNTER — Other Ambulatory Visit (INDEPENDENT_AMBULATORY_CARE_PROVIDER_SITE_OTHER): Payer: Self-pay | Admitting: Family Medicine

## 2020-09-26 ENCOUNTER — Ambulatory Visit
Admission: RE | Admit: 2020-09-26 | Discharge: 2020-09-26 | Disposition: A | Discharge status: Home / Self Care | Payer: 59 | Source: Ambulatory Visit | Attending: Family Medicine | Admitting: Family Medicine

## 2020-09-26 DIAGNOSIS — N644 Mastodynia: Secondary | ICD-10-CM

## 2020-10-02 ENCOUNTER — Other Ambulatory Visit (INDEPENDENT_AMBULATORY_CARE_PROVIDER_SITE_OTHER): Payer: Self-pay | Admitting: Family Medicine

## 2020-10-02 ENCOUNTER — Ambulatory Visit
Admission: RE | Admit: 2020-10-02 | Discharge: 2020-10-02 | Disposition: A | Discharge status: Home / Self Care | Payer: 59 | Source: Ambulatory Visit | Attending: Family Medicine | Admitting: Family Medicine

## 2020-10-02 DIAGNOSIS — N644 Mastodynia: Secondary | ICD-10-CM

## 2020-10-14 ENCOUNTER — Encounter (INDEPENDENT_AMBULATORY_CARE_PROVIDER_SITE_OTHER): Payer: Self-pay

## 2020-11-18 ENCOUNTER — Other Ambulatory Visit (INDEPENDENT_AMBULATORY_CARE_PROVIDER_SITE_OTHER): Payer: Self-pay | Admitting: "Endocrinology

## 2020-11-18 DIAGNOSIS — E890 Postprocedural hypothyroidism: Secondary | ICD-10-CM

## 2020-11-19 MED ORDER — ARMOUR THYROID 15 MG PO TABS
15.0000 mg | ORAL_TABLET | Freq: Every day | ORAL | 1 refills | Status: DC
Start: 2020-11-19 — End: 2021-01-25

## 2020-11-19 NOTE — Telephone Encounter (Signed)
RN called and spoke to the pt. Pt stated that she can get her labs done by Thursday but she only has 1 tab of armour thyroid 15 mg to take it tomorrow. She stated she will only be taking 60 mg until her labs after her 15 mg runs [place]. RN informed pt that she will be calling her back if anything changes.

## 2020-11-19 NOTE — Addendum Note (Signed)
Addended byZerita Boers on: 11/19/2020 05:13 PM     Modules accepted: Orders

## 2020-11-19 NOTE — Telephone Encounter (Signed)
Please advise pt she is due for labs to check the new dosage.

## 2020-11-19 NOTE — Telephone Encounter (Signed)
Refill sent for armour 15mg .  Please advise pt she should have the labs done while taking the total dosage of Armour along with the 15mg  tablet, she should not miss a dose before the labs as the results will not be accurate.

## 2020-11-20 NOTE — Telephone Encounter (Signed)
RN called and informed pt.

## 2020-12-04 ENCOUNTER — Encounter (INDEPENDENT_AMBULATORY_CARE_PROVIDER_SITE_OTHER): Payer: Self-pay | Admitting: Family Medicine

## 2020-12-04 ENCOUNTER — Telehealth (INDEPENDENT_AMBULATORY_CARE_PROVIDER_SITE_OTHER): Payer: 59 | Admitting: Family Medicine

## 2020-12-04 DIAGNOSIS — H1031 Unspecified acute conjunctivitis, right eye: Secondary | ICD-10-CM

## 2020-12-04 DIAGNOSIS — J019 Acute sinusitis, unspecified: Secondary | ICD-10-CM

## 2020-12-04 DIAGNOSIS — M791 Myalgia, unspecified site: Secondary | ICD-10-CM

## 2020-12-04 DIAGNOSIS — J3489 Other specified disorders of nose and nasal sinuses: Secondary | ICD-10-CM

## 2020-12-04 MED ORDER — AMOXICILLIN-POT CLAVULANATE 875-125 MG PO TABS
1.0000 | ORAL_TABLET | Freq: Two times a day (BID) | ORAL | 0 refills | Status: AC
Start: 2020-12-04 — End: 2020-12-14

## 2020-12-04 MED ORDER — POLYMYXIN B-TRIMETHOPRIM 10000-0.1 UNIT/ML-% OP SOLN
1.0000 [drp] | OPHTHALMIC | 0 refills | Status: DC
Start: 2020-12-04 — End: 2021-05-08

## 2020-12-04 NOTE — Progress Notes (Signed)
Subjective:    Date: 12/04/2020 2:09 PM   Patient ID: Tracy [name] is a 55 y.o. female.    This visit is being conducted via video and telephone.    Verbal consent has been obtained from the patient to conduct a video and telephone visit to minimize exposure to COVID-19: yes    Patient confirms she is in the state if [place] at the time of this visit    Chief Complaint   Patient presents with    Eye Drainage    Nasal Congestion            HPI  Pt c/o symptoms which began Friday afternoon (4 days ago)  Pt c/o mucus drainage from right eye, rhinorrhea, congestion  Headache.   Taking Nyquil cold and flu  Mucus coming out of right eye.         Fever or chills - 100.3 on Friday, no fever since then.      Cough - states has improved      Shortness of breath or difficulty breathing- denies      Fatigue - yes      Muscle or body aches - yes      Headache - yes      New loss of taste or smell - denies      Sore throat - yes      Congestion - yes      Runny nose - yes      Nausea or vomiting - denies      Diarrhea - denies    No problems updated.    Patient Active Problem List   Diagnosis    Migraine    History of hypertension    Abdominal cyst    Hiatal hernia    Hypokalemia    S/P gastric bypass    Fatty liver    Postsurgical hypothyroidism    Tubular adenoma of colon    Allergic rhinitis    Sleep difficulties    Difficulty concentrating    Forgetfulness    Hx of migraine headaches    Occipital headache    Frontal headache    Hx of concussion    White matter abnormality on MRI of brain    Hx of sleep apnea    Irritable bowel syndrome with diarrhea    Chronic diarrhea    Preventative health care     Patient Care Team:  Jules Husbands, DO as PCP - General (Family Medicine)  Einar Grad, MD as Consulting Physician (Gastroenterology)  Zerita Boers, MD as Consulting Physician (Endocrinology, Diabetes and Metabolism)  Campbell Lerner, MD as Consulting Physician (Neurology)  Frankey Shown Gerome Apley, MD as Consulting Physician  (Neurology)  Angelene Giovanni, MD as Consulting Physician (Vascular Surgery)    Immunization History   Administered Date(s) Administered    COVID-19 mRNA MONOVALENT vaccine PRIMARY SERIES 12 years and above [employer]) 30 mcg/0.3 mL (DILUTE BEFORE USE) 04/16/2019, 05/14/2019    Pneumococcal 23 valent 07/14/2019    Tdap 01/07/2011    Zoster [facility]) Vaccine Recombinant 07/14/2019     Current Outpatient Medications   Medication Sig Dispense Refill    albuterol sulfate HFA (PROVENTIL) 108 (90 Base) MCG/ACT inhaler Inhale 2 puffs into the lungs every 4 (four) hours as needed for Wheezing or Shortness of Breath (cough) 1 Inhaler 0    amoxicillin-clavulanate (AUGMENTIN) 875-125 MG per tablet Take 1 tablet by mouth 2 (two) times daily for 10 days 20 tablet 0  Armour Thyroid 15 MG tablet Take 1 tablet (15 mg) by mouth daily 30 tablet 1    Armour Thyroid 60 MG tablet Take 1 tablet (60 mg total) by mouth daily 90 tablet 1    Beclomethasone Dipropionate (Qnasl) 80 MCG/ACT Aero Soln 2 sprays by Nasal route daily 3 Inhaler 1    Biotin 5 MG/ML Liquid Take by mouth daily         butalbital-acetaminophen-caffeine (FIORICET) 50-325-40 MG per tablet Take 1 tablet by mouth every 4 (four) hours as needed for Headaches 15 tablet 0    calcium carbonate 1500 (600 Ca) MG Tab tablet Take 600 mg by mouth      cetirizine (ZyrTEC Allergy) 10 MG tablet Take 1 tablet (10 mg total) by mouth daily 90 tablet 0    Cyanocobalamin 500 MCG/0.1ML Solution 1 spray via nares weekly (Patient taking differently: Pt stated currently on oral Vitamin B12 daily) 1 Bottle 3    diclofenac Sodium (VOLTAREN) 1 % Gel topical gel Apply 4 g to affected area 4 times daily as needed.  Max: 16 g/joint/day.  Do not apply to broken, inflamed, or infected skin. 100 g 1    montelukast (SINGULAIR) 10 MG tablet TAKE 1 TABLET(10 MG) BY MOUTH EVERY NIGHT 90 tablet 0    Multiple Vitamins-Minerals (MULTIVITAMIN WITH MINERALS) tablet Take 1 tablet by mouth daily       Spacer/Aero-Holding Chambers (AEROCHAMBER Z-STAT PLUS/MEDIUM) inhaler Use as instructed 1 each 0    trimethoprim-polymyxin b (POLYTRIM) ophthalmic solution Place 1 drop into the right eye every 4 (four) hours 10 mL 0    ubrogepant (Ubrelvy) 100 MG tablet Take 1 tablet (100 mg total) by mouth once as needed (migraine) May repeat once after 2 hours.  Max of 200 mg/24 hours. 16 tablet 5    vitamin D (CHOLECALCIFEROL) 25 MCG (1000 UT) tablet Take 1,000 Units by mouth daily       No current facility-administered medications for this visit.     Allergies   Allergen Reactions    Bee Pollen Swelling     "Bee Stings".Marland KitchenMarland KitchenMarland KitchenStuffy nose; "throat swelling"      Vicodin [Hydrocodone-Acetaminophen] Nausea And Vomiting       Past Medical History:   Diagnosis Date    Anemia     Arthritis     Carotid stenosis     Diarrhea 12/2016    Since I had my dual switch surgery    Fatty liver 2017    GERD (gastroesophageal reflux disease)     Hepatic cirrhosis     Hiatal hernia     Hyperthyroidism 2013    Thyroid removed    Sleep apnea     not using CPAP machine    Vitamin D deficiency      Past Surgical History:   Procedure Laterality Date    CHOLECYSTECTOMY  12/2016    COLONOSCOPY, BIOPSY N/A 01/27/2018    Procedure: COLONOSCOPY, BIOPSY;  Surgeon: Shanon Brow, MD;  Location: [facility];  Service: Gastroenterology;  Laterality: N/A;    DUODENAL SWITCH  12/2016    EGD, BIOPSY N/A 01/27/2018    Procedure: EGD, BIOPSY;  Surgeon: Shanon Brow, MD;  Location: [facility];  Service: Gastroenterology;  Laterality: N/A;    GASTRECTOMY  09/2013    HYSTERECTOMY  2015    Partial.    LAPAROSCOPIC REVISION SLEEVE GASTRECTOMY TO GASTRIC BYPASS      LAPAROSCOPIC, GASTRECTOMY SLEEVE      SALPINGO OOPHERECTOMY  Bilateral     THYROIDECTOMY  2013       Family History   Problem Relation Age of Onset    Cancer Mother         hx ovarian    Hypertension Mother     Inflammatory bowel disease Mother     Heart failure Mother     Angina Mother      Migraines Mother     Asthma Father     Diabetes Father     Hyperlipidemia Father     Hypertension Father     Cancer Father         Currently has cancer    Crohn's disease Father     Asthma Sister     Diabetes Sister     Hyperlipidemia Sister     Hypertension Sister     Early death Sister     Cancer Brother         hx of stomach    Hyperlipidemia Brother     Hypertension Brother     Migraines Brother     Learning disabilities Brother         Deaf    Breast cancer Paternal Aunt         died of     Breast cancer Other         maternal cousin-- survior    Obesity Son        Social History     Socioeconomic History    Marital status: Significant Other   Tobacco Use    Smoking status: Never    Smokeless tobacco: Never    Tobacco comments:     None   Vaping Use    Vaping Use: Never used   Substance and Sexual Activity    Alcohol use: No    Drug use: Not Currently    Sexual activity: Not Currently     Partners: Male     Birth control/protection: Post-menopausal, Rhythm   Other Topics Concern    Dietary supplements / vitamins Yes     Comment: Biotin B12 multi vitamins D3    Anesthesia problems No    Blood thinners No    Pregnant No    Future Children No    Number of Pregnancies? No    Number of children Yes     Comment: 1    Miscarriages / Abortions? No    Eats large amounts No    Excessive Sweets No    Skips meals No    Eats excessive starches No    Snacks or grazes No    Emotional eater No    Eats fried food No    Eats fast food No    Diet Center Yes    Doylene Bode No    LA Weight Loss No    Nutri-System No    Opti-Fast / Medi-Fast Yes    Overeaters Anonymous No    [facility] Yes    TOPS No    Weight Watchers Yes    Atkins Yes    Binging / Purging No    Calorie Counting Yes    Fasting Yes    High Protein Yes    Low Carb Yes    Low Fat No    [facility] Diet No    Slim Fast Yes    [facility] No    Stationary cycle or treadmill Yes    Gym/fitness Classes Yes    Home exercise/video Yes  Swimming Yes     Weight training Yes    Walking or running Yes    Hospitalization No    Hypnosis No    Physical therapy Yes    Psychological therapy Yes    Residential program No    Acutrim No    [place] No    Contrave No    Dexatrim Yes    Diethylpropion No    Fastin No    Fen - Phen Yes    Ionamin / Adipex No    Phentermine Yes    Qsymia No    Prozac Yes    Saxenda No    Topamax No    Wellbutrin No    Xenical (Orlistat, Alli) No    Other Med No    No impairment Yes    Walks with cane/crutch Yes    Requires a wheelchair No    Bedridden No    Are you currently being treated for depression? No    Do you snore? No    Are you receiving any medical or psychological services? No    Do you ever wake up at night gasping for breath? No    Do you have or have you been treated for an eating disorder? No    Anyone ever told you that you stop breathing while asleep? No    Do you exercise regularly? Yes    Have you or family member ever have trouble with anesthesia? Yes     Social Determinants of Health     Financial Resource Strain: High Risk    Difficulty of Paying Living Expenses: Hard   Food Insecurity: No Food Insecurity    Worried About Programme researcher, broadcasting/film/video in the Last Year: Never true    Ran Out of Food in the Last Year: Never true   Transportation Needs: No Transportation Needs    Lack of Transportation (Medical): No    Lack of Transportation (Non-Medical): No   Physical Activity: Sufficiently Active    Days of Exercise per Week: 5 days    Minutes of Exercise per Session: 40 min   Stress: Stress Concern Present    Feeling of Stress : Rather much   Social Connections: Moderately Integrated    Frequency of Communication with Friends and Family: More than three times a week    Frequency of Social Gatherings with Friends and Family: Once a week    Attends Religious Services: More than 4 times per year    Active Member of [employer] or Organizations: Yes    Attends Engineer, structural: More than 4 times per year    Marital Status: Never  married   Catering manager Violence: Not At Risk    Fear of Current or Ex-Partner: No    Emotionally Abused: No    Physically Abused: No    Sexually Abused: No   Housing Stability: High Risk    Unable to Pay for Housing in the Last Year: Yes    Number of Places Lived in the Last Year: 1    Unstable Housing in the Last Year: No     The following portions of the patient's history were reviewed and updated as appropriate: allergies, current medications, past family history, past medical history, past social history, past surgical history and problem list.    Review of Systems     Objective:   LMP  (LMP Unknown)   Wt Readings from Last 3 Encounters:   09/18/20 62.4 kg (137 lb  9.6 oz)   08/21/20 62.3 kg (137 lb 6.4 oz)   08/03/20 62.4 kg (137 lb 9.6 oz)     Physical Exam  Constitutional:       General: She is not in acute distress.     Appearance: She is not ill-appearing.   Eyes:      General: Lids are normal.         Right eye: No discharge.         Left eye: No discharge.      Extraocular Movements:      Right eye: Normal extraocular motion.      Left eye: Normal extraocular motion.      Conjunctiva/sclera:      Right eye: Right conjunctiva is not injected. No chemosis or exudate.     Left eye: Left conjunctiva is not injected. No chemosis or exudate.  Pulmonary:      Comments: Patient speaks in full sentences throughout video visit without any difficulty breathing/shortness of breath    Neurological:      Mental Status: She is alert.        Assessment/Plan:       1. Acute sinusitis, recurrence not specified, unspecified location  Suspect viral  Symptomatic management discussed  If symptoms persist /worsen next 3 days, start antibiotic   - amoxicillin-clavulanate (AUGMENTIN) 875-125 MG per tablet; Take 1 tablet by mouth 2 (two) times daily for 10 days  Dispense: 20 tablet; Refill: 0    Increase oral hydration and ensure adequate nutrition and rest  Signs and symptoms that warrant return/urgent/emergent evaluation  discussed   Mucinex PRN congestion/cough  Tylenol PRN pain/myalgia    2. Acute conjunctivitis of right eye, unspecified acute conjunctivitis type  - trimethoprim-polymyxin b (POLYTRIM) ophthalmic solution; Place 1 drop into the right eye every 4 (four) hours  Dispense: 10 mL; Refill: 0    3. Rhinorrhea  - Sofia(TM) SARS COVID19 & Flu A/B POCT; Future  - COVID-19 (SARS-CoV-2); Future    4. Myalgia  - Sofia(TM) SARS COVID19 & Flu A/B POCT; Future  - COVID-19 (SARS-CoV-2); Future    Advised nurse visit to complete the tests ordered.    Pt states she would schedule, however wants to check if insurance covers first.     Return if symptoms worsen or fail to improve.    Jules Husbands, DO

## 2020-12-19 ENCOUNTER — Other Ambulatory Visit (FREE_STANDING_LABORATORY_FACILITY): Payer: 59

## 2020-12-19 DIAGNOSIS — E890 Postprocedural hypothyroidism: Secondary | ICD-10-CM

## 2020-12-19 LAB — T4, FREE: T4 Free: 0.77 ng/dL (ref 0.69–1.48)

## 2020-12-19 LAB — T3, FREE: T3, Free: 2.27 pg/mL (ref 1.71–3.71)

## 2020-12-19 LAB — TSH: TSH: 0.8 u[IU]/mL (ref 0.35–4.94)

## 2021-01-25 ENCOUNTER — Other Ambulatory Visit (INDEPENDENT_AMBULATORY_CARE_PROVIDER_SITE_OTHER): Payer: Self-pay | Admitting: "Endocrinology

## 2021-01-25 DIAGNOSIS — E890 Postprocedural hypothyroidism: Secondary | ICD-10-CM

## 2021-02-21 ENCOUNTER — Other Ambulatory Visit (INDEPENDENT_AMBULATORY_CARE_PROVIDER_SITE_OTHER): Payer: Self-pay | Admitting: "Endocrinology

## 2021-02-21 DIAGNOSIS — E890 Postprocedural hypothyroidism: Secondary | ICD-10-CM

## 2021-02-26 ENCOUNTER — Other Ambulatory Visit (INDEPENDENT_AMBULATORY_CARE_PROVIDER_SITE_OTHER): Payer: Self-pay | Admitting: "Endocrinology

## 2021-02-26 DIAGNOSIS — E890 Postprocedural hypothyroidism: Secondary | ICD-10-CM

## 2021-05-08 ENCOUNTER — Encounter (INDEPENDENT_AMBULATORY_CARE_PROVIDER_SITE_OTHER): Payer: Self-pay | Admitting: Family Medicine

## 2021-05-08 ENCOUNTER — Ambulatory Visit (INDEPENDENT_AMBULATORY_CARE_PROVIDER_SITE_OTHER): Payer: 59 | Admitting: Family Medicine

## 2021-05-08 ENCOUNTER — Telehealth (INDEPENDENT_AMBULATORY_CARE_PROVIDER_SITE_OTHER): Payer: Self-pay | Admitting: Family Medicine

## 2021-05-08 VITALS — BP 159/85 | HR 57 | Temp 97.7°F | Resp 20 | Ht 62.5 in | Wt 131.0 lb

## 2021-05-08 DIAGNOSIS — J019 Acute sinusitis, unspecified: Secondary | ICD-10-CM

## 2021-05-08 DIAGNOSIS — F5089 Other specified eating disorder: Secondary | ICD-10-CM

## 2021-05-08 LAB — CBC AND DIFFERENTIAL
Absolute NRBC: 0 10*3/uL (ref 0.00–0.00)
Basophils Absolute Automated: 0.05 10*3/uL (ref 0.00–0.08)
Basophils Automated: 1.4 %
Eosinophils Absolute Automated: 0.35 10*3/uL (ref 0.00–0.44)
Eosinophils Automated: 9.6 %
Hematocrit: 33 % — ABNORMAL LOW (ref 34.7–43.7)
Hgb: 10.1 g/dL — ABNORMAL LOW (ref 11.4–14.8)
Immature Granulocytes Absolute: 0 10*3/uL (ref 0.00–0.07)
Immature Granulocytes: 0 %
Instrument Absolute Neutrophil Count: 1.1 10*3/uL (ref 1.10–6.33)
Lymphocytes Absolute Automated: 1.77 10*3/uL (ref 0.42–3.22)
Lymphocytes Automated: 48.8 %
MCH: 26.9 pg (ref 25.1–33.5)
MCHC: 30.6 g/dL — ABNORMAL LOW (ref 31.5–35.8)
MCV: 88 fL (ref 78.0–96.0)
MPV: 11.6 fL (ref 8.9–12.5)
Monocytes Absolute Automated: 0.36 10*3/uL (ref 0.21–0.85)
Monocytes: 9.9 %
Neutrophils Absolute: 1.1 10*3/uL (ref 1.10–6.33)
Neutrophils: 30.3 %
Nucleated RBC: 0 /100 WBC (ref 0.0–0.0)
Platelets: 204 10*3/uL (ref 142–346)
RBC: 3.75 10*6/uL — ABNORMAL LOW (ref 3.90–5.10)
RDW: 16 % — ABNORMAL HIGH (ref 11–15)
WBC: 3.63 10*3/uL (ref 3.10–9.50)

## 2021-05-08 LAB — IRON PROFILE
Iron Saturation: 10 % — ABNORMAL LOW (ref 15–50)
Iron: 37 ug/dL — ABNORMAL LOW (ref 40–145)
TIBC: 374 ug/dL (ref 265–497)
UIBC: 337 ug/dL (ref 126–382)

## 2021-05-08 LAB — VITAMIN B12: Vitamin B-12: 1031 pg/mL — ABNORMAL HIGH (ref 211–911)

## 2021-05-08 LAB — HEMOLYSIS INDEX: Hemolysis Index: 5 Index (ref 0–24)

## 2021-05-08 LAB — FOLATE: Folate: 7 ng/mL

## 2021-05-08 MED ORDER — AMOXICILLIN-POT CLAVULANATE 875-125 MG PO TABS
1.0000 | ORAL_TABLET | Freq: Two times a day (BID) | ORAL | 0 refills | Status: AC
Start: 2021-05-08 — End: 2021-05-18

## 2021-05-08 NOTE — Telephone Encounter (Signed)
Called patient and left a detailed message stating that Dr. Nechama Guard stated that she will need to follow up with her endocrinologist to have her thyroid checked.

## 2021-05-08 NOTE — Progress Notes (Signed)
Have you seen any specialists/other providers since your last visit with [place]?    No    The patient is due for:   Health Maintenance Due   Topic Date Due    Statin Use  Never done    Advance Directive on File  Never done    Tetanus Ten-Year  01/06/2021

## 2021-05-08 NOTE — Progress Notes (Signed)
Subjective     Tracy [name] is a 56 y.o. female who presents today for   Chief Complaint   Patient presents with    Cough     Patient states she has has a cough, congestion, sneezing approx 1 week, no fevers    Nasal Congestion     HPIPt c/o rhinorrhea, sneezing, and ear itching which started on Monday 9 days ago. +purulent sinus drainage.   +Cough +Headache.   Ears itching.   Covid test - took 3 at home which were negative.   Denies shortness of breath.  Denies fever/chills.     Pt requesting to have iron checked.  States she likes to eat ice and this has been going on for many years.     Lab Results   Component Value Date    WBC 3.87 03/23/2020    HGB 9.9 (L) 03/23/2020    HCT 31.8 (L) 03/23/2020    MCV 91.1 03/23/2020    PLT 207 03/23/2020     Lab Results   Component Value Date    IRON 54 04/07/2019    TIBC 408 04/07/2019    FERRITIN 11.25 04/07/2019       No problems updated.    Patient Active Problem List   Diagnosis    Migraine    History of hypertension    Abdominal cyst    Hiatal hernia    Hypokalemia    S/P gastric bypass    Fatty liver    Postsurgical hypothyroidism    Tubular adenoma of colon    Allergic rhinitis    Sleep difficulties    Difficulty concentrating    Forgetfulness    Hx of migraine headaches    Occipital headache    Frontal headache    Hx of concussion    White matter abnormality on MRI of brain    Hx of sleep apnea    Irritable bowel syndrome with diarrhea    Chronic diarrhea    Preventative health care     Current Outpatient Medications   Medication Sig Dispense Refill    Armour Thyroid 15 MG tablet Take 1 tablet by mouth once daily 30 tablet 5    Armour Thyroid 60 MG tablet TAKE 1 TABLET BY MOUTH EVERY DAY 30 tablet 5    Beclomethasone Dipropionate (Qnasl) 80 MCG/ACT Aero Soln 2 sprays by Nasal route daily 3 Inhaler 1    Biotin 5 MG/ML Liquid Take by mouth daily         butalbital-acetaminophen-caffeine (FIORICET) 50-325-40 MG per tablet Take 1 tablet by mouth every 4 (four) hours  as needed for Headaches 15 tablet 0    calcium carbonate 1500 (600 Ca) MG Tab tablet Take 600 mg by mouth      cetirizine (ZyrTEC Allergy) 10 MG tablet Take 1 tablet (10 mg total) by mouth daily 90 tablet 0    Cyanocobalamin 500 MCG/0.1ML Solution 1 spray via nares weekly (Patient taking differently: Pt stated currently on oral Vitamin B12 5000mcg daily) 1 Bottle 3    diclofenac Sodium (VOLTAREN) 1 % Gel topical gel Apply 4 g to affected area 4 times daily as needed.  Max: 16 g/joint/day.  Do not apply to broken, inflamed, or infected skin. 100 g 1    montelukast (SINGULAIR) 10 MG tablet TAKE 1 TABLET(10 MG) BY MOUTH EVERY NIGHT 90 tablet 0    Multiple Vitamins-Minerals (MULTIVITAMIN WITH MINERALS) tablet Take 1 tablet by mouth daily      Spacer/Aero-Holding  Chambers (AEROCHAMBER Z-STAT PLUS/MEDIUM) inhaler Use as instructed 1 each 0    vitamin D (CHOLECALCIFEROL) 25 MCG (1000 UT) tablet Take 1,000 Units by mouth daily      albuterol sulfate HFA (PROVENTIL) 108 (90 Base) MCG/ACT inhaler Inhale 2 puffs into the lungs every 4 (four) hours as needed for Wheezing or Shortness of Breath (cough) (Patient not taking: Reported on 05/08/2021) 1 Inhaler 0    amoxicillin-clavulanate (AUGMENTIN) 875-125 MG per tablet Take 1 tablet by mouth 2 (two) times daily for 10 days 20 tablet 0    ubrogepant (Ubrelvy) 100 MG tablet Take 1 tablet (100 mg total) by mouth once as needed (migraine) May repeat once after 2 hours.  Max of 200 mg/24 hours. (Patient not taking: Reported on 05/08/2021) 16 tablet 5     No current facility-administered medications for this visit.     Allergies   Allergen Reactions    Bee Pollen Swelling     "Bee Stings".Marland KitchenMarland KitchenMarland KitchenStuffy nose; "throat swelling"      Vicodin [Hydrocodone-Acetaminophen] Nausea And Vomiting       Patient Care Team:  Jules Husbands, DO as PCP - General (Family Medicine)  Einar Grad, MD as Consulting Physician (Gastroenterology)  Zerita Boers, MD as Consulting Physician (Endocrinology, Diabetes  and Metabolism)  Campbell Lerner, MD as Consulting Physician (Neurology)  Frankey Shown Gerome Apley, MD as Consulting Physician (Neurology)  Angelene Giovanni, MD as Consulting Physician (Vascular Surgery)    Review of Systems    Objective     BP 159/85 (BP Site: Left arm, Patient Position: Sitting, Cuff Size: Medium)   Pulse (!) 57   Temp 97.7 F (36.5 C)   Resp 20   Ht 1.588 m (5' 2.5")   Wt 59.4 kg (131 lb)   LMP  (LMP Unknown)   SpO2 98%   BMI 23.58 kg/m   Wt Readings from Last 3 Encounters:   05/08/21 59.4 kg (131 lb)   09/18/20 62.4 kg (137 lb 9.6 oz)   08/21/20 62.3 kg (137 lb 6.4 oz)     Physical Exam  Vitals reviewed.   Constitutional:       General: She is not in acute distress.  HENT:      Right Ear: Tympanic membrane normal. No middle ear effusion. Tympanic membrane is not injected, erythematous or bulging.      Left Ear: Tympanic membrane normal.  No middle ear effusion. Tympanic membrane is not injected, erythematous or bulging.      Mouth/Throat:      Pharynx: Oropharynx is clear. Posterior oropharyngeal erythema present. No oropharyngeal exudate.      Tonsils: No tonsillar exudate.   Eyes:      Conjunctiva/sclera:      Right eye: Right conjunctiva is not injected. No exudate.     Left eye: Left conjunctiva is not injected. No exudate.  Pulmonary:      Effort: Pulmonary effort is normal. No respiratory distress.      Breath sounds: No decreased breath sounds, wheezing or rhonchi.   Lymphadenopathy:      Head:      Right side of head: No submental, submandibular, tonsillar, preauricular, posterior auricular or occipital adenopathy.      Left side of head: No submental, submandibular, tonsillar, preauricular, posterior auricular or occipital adenopathy.      Cervical: No cervical adenopathy.      Right cervical: No superficial, deep or posterior cervical adenopathy.     Left cervical: No superficial, deep or  posterior cervical adenopathy.   Skin:     Findings: No rash.   Neurological:      Mental Status: She  is alert.         Diagnostic Tests:    Assessment & Plan     1. Acute sinusitis, recurrence not specified, unspecified location  - amoxicillin-clavulanate (AUGMENTIN) 875-125 MG per tablet; Take 1 tablet by mouth 2 (two) times daily for 10 days  Dispense: 20 tablet; Refill: 0    Treatment recommendations: optimize H20 hydration and OTC Mucinex DM twice a day as needed  Due to persistent symptoms and typical physical exam findings, recommend antibiotic therapy. Recommend treatment with Augmentin therapy.  Side effects of Augmentin include but not limited to rash, GI upset, diarrhea, or dizziness. Complete entire course of antibiotic therapy as directed.  Increase oral hydration and ensure adequate nutrition and rest  Mucinex PRN congestion/cough  Tylenol PRN pain/myalgia    2. Pica  - CBC and differential  - IRON PROFILE  - Vitamin B12  - Folate    Due for follow-up with endocrinology Dr. Rivka Safer - advised to schedule.     Return in about 19 weeks (around 09/18/2021) for Preventive Wellness Visit, Return sooner as needed.

## 2021-05-13 ENCOUNTER — Encounter (INDEPENDENT_AMBULATORY_CARE_PROVIDER_SITE_OTHER): Payer: Self-pay | Admitting: Family Medicine

## 2021-05-13 DIAGNOSIS — D649 Anemia, unspecified: Secondary | ICD-10-CM | POA: Insufficient documentation

## 2021-06-25 ENCOUNTER — Telehealth (INDEPENDENT_AMBULATORY_CARE_PROVIDER_SITE_OTHER): Payer: 59 | Admitting: Family Medicine

## 2021-06-25 ENCOUNTER — Encounter (INDEPENDENT_AMBULATORY_CARE_PROVIDER_SITE_OTHER): Payer: Self-pay | Admitting: Family Medicine

## 2021-06-25 DIAGNOSIS — R9082 White matter disease, unspecified: Secondary | ICD-10-CM

## 2021-06-25 DIAGNOSIS — G43019 Migraine without aura, intractable, without status migrainosus: Secondary | ICD-10-CM

## 2021-06-25 NOTE — Progress Notes (Signed)
Subjective:    Date: 06/25/2021 3:09 PM   Patient ID: Tracy [name] is a 56 y.o. female.    This visit is being conducted via video and audio.    Verbal consent has been obtained from the patient to conduct a video and audio visit.    Patient confirms she is in the state of [place] at the time of this visit    Chief Complaint   Patient presents with    Headache     Requesting MRI and referral to neurology     HPI  Pt with history of migraines, reports has been having off and on pressure in the back of her head that is also like a pulsating sensation similar to migraines, however usually has pulsating sensation on the top of the head.  Reports this has been ongoing at least the last 2 months and occurs every few days and can last for about a day.  Also reports having a tightness of her neck on the right side. States that the headache resolves with tylenol and advil.  She denies taking these medications more than 3 days per week.    She occasionally has tearing of the right eye.    She was prescribed ubrogepant by neurology Dr. Frankey Shown, however states she never got this from the pharmacy.  She is not sure, but thinks it may have been too costly so she never got it.   She is taking magnesium, but denies taking riboflavin.  She has seen DR. Stacy Gardner in the past, in 2020.  Her last visit with DR. Cinski about 1 year ago.  She was also seeing a neurologist - Dr. Su Hilt at [facility] [place] who reportedly ordered an MRI however patient states she did not go through with this because of some insurance related issues with the office.   She endorses occasional blurry vision  Occasional nausea  Denies photo or phonophobia.     No problems updated.    Patient Active Problem List   Diagnosis    Migraine    History of hypertension    Abdominal cyst    Hiatal hernia    Hypokalemia    S/P gastric bypass    Fatty liver    Postsurgical hypothyroidism    Tubular adenoma of colon    Allergic rhinitis    Sleep difficulties     Difficulty concentrating    Forgetfulness    Hx of migraine headaches    Occipital headache    Frontal headache    Hx of concussion    White matter abnormality on MRI of brain    Hx of sleep apnea    Irritable bowel syndrome with diarrhea    Chronic diarrhea    Preventative health care    Normocytic anemia     Patient Care Team:  Jules Husbands, DO as PCP - General (Family Medicine)  Einar Grad, MD as Consulting Physician (Gastroenterology)  Zerita Boers, MD as Consulting Physician (Endocrinology, Diabetes and Metabolism)  Campbell Lerner, MD as Consulting Physician (Neurology)  Frankey Shown Gerome Apley, MD as Consulting Physician (Neurology)  Angelene Giovanni, MD as Consulting Physician (Vascular Surgery)    Immunization History   Administered Date(s) Administered    COVID-19 mRNA BIVALENT vaccine 12 years and above [employer]) 30 mcg/0.3 mL 11/21/2020    COVID-19 mRNA MONOVALENT vaccine PRIMARY SERIES 12 years and above (Pfizer) 30 mcg/0.3 mL (DILUTE BEFORE USE) 04/16/2019, 05/14/2019    Pneumococcal 23 valent 07/14/2019  Tdap 01/07/2011    Zoster [facility]) Vaccine Recombinant 07/14/2019     Current Outpatient Medications   Medication Sig Dispense Refill    albuterol sulfate HFA (PROVENTIL) 108 (90 Base) MCG/ACT inhaler Inhale 2 puffs into the lungs every 4 (four) hours as needed for Wheezing or Shortness of Breath (cough) (Patient not taking: Reported on 05/08/2021) 1 Inhaler 0    Armour Thyroid 15 MG tablet Take 1 tablet by mouth once daily 30 tablet 5    Armour Thyroid 60 MG tablet TAKE 1 TABLET BY MOUTH EVERY DAY 30 tablet 5    Beclomethasone Dipropionate (Qnasl) 80 MCG/ACT Aero Soln 2 sprays by Nasal route daily 3 Inhaler 1    Biotin 5 MG/ML Liquid Take by mouth daily         butalbital-acetaminophen-caffeine (FIORICET) 50-325-40 MG per tablet Take 1 tablet by mouth every 4 (four) hours as needed for Headaches 15 tablet 0    calcium carbonate 1500 (600 Ca) MG Tab tablet Take 600 mg by mouth      cetirizine (ZyrTEC  Allergy) 10 MG tablet Take 1 tablet (10 mg total) by mouth daily 90 tablet 0    Cyanocobalamin 500 MCG/0.1ML Solution 1 spray via nares weekly (Patient taking differently: Pt stated currently on oral Vitamin B12 daily) 1 Bottle 3    diclofenac Sodium (VOLTAREN) 1 % Gel topical gel Apply 4 g to affected area 4 times daily as needed.  Max: 16 g/joint/day.  Do not apply to broken, inflamed, or infected skin. 100 g 1    montelukast (SINGULAIR) 10 MG tablet TAKE 1 TABLET(10 MG) BY MOUTH EVERY NIGHT 90 tablet 0    Multiple Vitamins-Minerals (MULTIVITAMIN WITH MINERALS) tablet Take 1 tablet by mouth daily      Spacer/Aero-Holding Chambers (AEROCHAMBER Z-STAT PLUS/MEDIUM) inhaler Use as instructed 1 each 0    ubrogepant (Ubrelvy) 100 MG tablet Take 1 tablet (100 mg total) by mouth once as needed (migraine) May repeat once after 2 hours.  Max of 200 mg/24 hours. (Patient not taking: Reported on 05/08/2021) 16 tablet 5    vitamin D (CHOLECALCIFEROL) 25 MCG (1000 UT) tablet Take 1,000 Units by mouth daily       No current facility-administered medications for this visit.     Allergies   Allergen Reactions    Bee Pollen Swelling     "Bee Stings".Marland KitchenMarland KitchenMarland KitchenStuffy nose; "throat swelling"      Vicodin [Hydrocodone-Acetaminophen] Nausea And Vomiting       Past Medical History:   Diagnosis Date    Anemia     Arthritis     Carotid stenosis     Diarrhea 12/2016    Since I had my dual switch surgery    Fatty liver 2017    GERD (gastroesophageal reflux disease)     Hepatic cirrhosis     Hiatal hernia     Hyperthyroidism 2013    Thyroid removed    Sleep apnea     not using CPAP machine    Vitamin D deficiency      Past Surgical History:   Procedure Laterality Date    CHOLECYSTECTOMY  12/2016    COLONOSCOPY, BIOPSY N/A 01/27/2018    Procedure: COLONOSCOPY, BIOPSY;  Surgeon: Shanon Brow, MD;  Location: [facility];  Service: Gastroenterology;  Laterality: N/A;    DUODENAL SWITCH  12/2016    EGD, BIOPSY N/A 01/27/2018     Procedure: EGD, BIOPSY;  Surgeon: Shanon Brow, MD;  Location: [facility];  Service: Gastroenterology;  Laterality: N/A;    GASTRECTOMY  09/2013    HYSTERECTOMY  2015    Partial.    LAPAROSCOPIC REVISION SLEEVE GASTRECTOMY TO GASTRIC BYPASS      LAPAROSCOPIC, GASTRECTOMY SLEEVE      SALPINGO OOPHERECTOMY Bilateral     THYROIDECTOMY  2013       Family History   Problem Relation Age of Onset    Cancer Mother         hx ovarian    Hypertension Mother     Inflammatory bowel disease Mother     Heart failure Mother     Angina Mother     Migraines Mother     Asthma Father     Diabetes Father     Hyperlipidemia Father     Hypertension Father     Cancer Father         Currently has cancer    Crohn's disease Father     Asthma Sister     Diabetes Sister     Hyperlipidemia Sister     Hypertension Sister     Early death Sister     Cancer Brother         hx of stomach    Hyperlipidemia Brother     Hypertension Brother     Migraines Brother     Learning disabilities Brother         Deaf    Breast cancer Paternal Aunt         died of     Breast cancer Other         maternal cousin-- survior    Obesity Son        Social History     Socioeconomic History    Marital status: Significant Other   Tobacco Use    Smoking status: Never    Smokeless tobacco: Never    Tobacco comments:     None   Vaping Use    Vaping status: Never Used   Substance and Sexual Activity    Alcohol use: No    Drug use: Not Currently    Sexual activity: Not Currently     Partners: Male     Birth control/protection: Post-menopausal, Rhythm   Other Topics Concern    Dietary supplements / vitamins Yes     Comment: Biotin B12 multi vitamins D3    Anesthesia problems No    Blood thinners No    Pregnant No    Future Children No    Number of Pregnancies? No    Number of children Yes     Comment: 1    Miscarriages / Abortions? No    Eats large amounts No    Excessive Sweets No    Skips meals No    Eats excessive starches No    Snacks or grazes No    Emotional  eater No    Eats fried food No    Eats fast food No    Diet Center Yes    Doylene Bode No    LA Weight Loss No    Nutri-System No    Opti-Fast / Medi-Fast Yes    Overeaters Anonymous No    [facility] Yes    TOPS No    Weight Watchers Yes    Atkins Yes    Binging / Purging No    Calorie Counting Yes    Fasting Yes    High Protein Yes  Low Carb Yes    Low Fat No    [facility] Diet No    Slim Fast Yes    [place] No    Stationary cycle or treadmill Yes    Gym/fitness Classes Yes    Home exercise/video Yes    Swimming Yes    Weight training Yes    Walking or running Yes    Hospitalization No    Hypnosis No    Physical therapy Yes    Psychological therapy Yes    Residential program No    Acutrim No    [place] No    Contrave No    Dexatrim Yes    Diethylpropion No    Fastin No    Fen - Phen Yes    Ionamin / Adipex No    Phentermine Yes    Qsymia No    Prozac Yes    Saxenda No    Topamax No    Wellbutrin No    Xenical (Orlistat, Alli) No    Other Med No    No impairment Yes    Walks with cane/crutch Yes    Requires a wheelchair No    Bedridden No    Are you currently being treated for depression? No    Do you snore? No    Are you receiving any medical or psychological services? No    Do you ever wake up at night gasping for breath? No    Do you have or have you been treated for an eating disorder? No    Anyone ever told you that you stop breathing while asleep? No    Do you exercise regularly? Yes    Have you or family member ever have trouble with anesthesia? Yes     Social Determinants of Health     Financial Resource Strain: Medium Risk (06/25/2021)    Overall Financial Resource Strain (CARDIA)     Difficulty of Paying Living Expenses: Somewhat hard   Food Insecurity: Food Insecurity Present (06/25/2021)    Hunger Vital Sign     Worried About Running Out of Food in the Last Year: Never true     Ran Out of Food in the Last Year: Sometimes true   Transportation Needs: No Transportation Needs  (06/25/2021)    PRAPARE - Therapist, art (Medical): No     Lack of Transportation (Non-Medical): No   Physical Activity: Sufficiently Active (06/25/2021)    Exercise Vital Sign     Days of Exercise per Week: 6 days     Minutes of Exercise per Session: 60 min   Stress: No Stress Concern Present (06/25/2021)    [employer] of Occupational Health - Occupational Stress Questionnaire     Feeling of Stress : Only a little   Social Connections: Moderately Integrated (05/17/2020)    Social Connection and Isolation Panel [NHANES]     Frequency of Communication with Friends and Family: More than three times a week     Frequency of Social Gatherings with Friends and Family: Once a week     Attends Religious Services: More than 4 times per year     Active Member of [employer] or Organizations: Yes     Attends Banker Meetings: More than 4 times per year     Marital Status: Never married   Intimate Partner Violence: Not At Risk (05/17/2020)    Humiliation, Afraid, Rape, and Kick questionnaire     Fear  of Current or Ex-Partner: No     Emotionally Abused: No     Physically Abused: No     Sexually Abused: No   Housing Stability: High Risk (06/25/2021)    Housing Stability Vital Sign     Unable to Pay for Housing in the Last Year: Yes     Number of Places Lived in the Last Year: 1     Unstable Housing in the Last Year: No     The following portions of the patient's history were reviewed and updated as appropriate: allergies, current medications, past family history, past medical history, past social history, past surgical history and problem list.    Review of Systems     Objective:   LMP  (LMP Unknown)   Wt Readings from Last 3 Encounters:   05/08/21 59.4 kg (131 lb)   09/18/20 62.4 kg (137 lb 9.6 oz)   08/21/20 62.3 kg (137 lb 6.4 oz)     Physical Exam       Assessment/Plan:       1. Intractable migraine without aura and without status migrainosus  Pt states her headache responds to tylenol and  ibuprofen and she is not taking excessive amounts to be concerned about overuse headache, so continue this  Ubrogepant reportedly too costly and she never picked up. Advise to discuss alternative with neurologist.   Taken magnesium / riboflavin for prevention  Follow-up with neurology  - Referral to Neurology ([place]); Future    2. White matter abnormality on MRI of brain  Followup with neurology  - MRI Brain W WO Contrast; Future  - Referral to Neurology ([place]); Future    Return to discuss low iron - pt to schedule at earliest convenience.    The following activities were performed on the date of service:  preparing to see the patient: - chart review   - review of prior imaging tests  - review of consultant reports  obtaining and/or reviewing the separately obtained history  performing a medically appropriate examination and/or evaluation   counseling and educating the patient/family/caregiver  ordering medications, tests, or procedures  documenting clinical information in the electronic or other health records    Total time spent performing activities on date of service:  41  minutes          Jules Husbands, DO

## 2021-07-02 ENCOUNTER — Encounter: Payer: Self-pay | Admitting: Family Nurse Practitioner

## 2021-07-02 ENCOUNTER — Ambulatory Visit: Payer: 59 | Attending: Family Nurse Practitioner | Admitting: Family Nurse Practitioner

## 2021-07-02 VITALS — Ht 63.0 in | Wt 133.0 lb

## 2021-07-02 DIAGNOSIS — M5481 Occipital neuralgia: Secondary | ICD-10-CM

## 2021-07-02 DIAGNOSIS — G43019 Migraine without aura, intractable, without status migrainosus: Secondary | ICD-10-CM

## 2021-07-02 DIAGNOSIS — R9082 White matter disease, unspecified: Secondary | ICD-10-CM

## 2021-07-02 DIAGNOSIS — I779 Disorder of arteries and arterioles, unspecified: Secondary | ICD-10-CM

## 2021-07-02 MED ORDER — GABAPENTIN 300 MG PO CAPS
300.0000 mg | ORAL_CAPSULE | Freq: Every evening | ORAL | 2 refills | Status: DC
Start: 2021-07-02 — End: 2021-08-20

## 2021-07-02 NOTE — Progress Notes (Signed)
07/02/2021    Patient ID: Tracy [name]    Chief Complaint:    Chief Complaint   Patient presents with    Migraine     History of Present Illness:  Tracy [name] is a 56 y.o. female who presents for follow up of headache, neck pain, carotid artery irregularity.     She was last seen by Dr. Frankey Shown on 05-17-20. At that time Dr. Frankey Shown  recommended MRI brain,  Mg/B2/CoQ10 for headache prevention, trial of ubrogepant.    July 02 2021    The headaches start in the occipital, sub occipital, and travels up and to the temporal area.   Says these are different than the migraines she has had in the past.   Pain starts usually on the right, and then encompasses the entire back of her head.  She describes the pain as severe. Pain be shooting or stabbing.  The area is sensitive to the touch.  At night the pain is more of a throbbing, pulsating. She has to sleep with a certain type of pillow for comfort as as the entire back of head is painful  She denies photophobia, photophobia. Patient does report intermittent blurry vision of the right eye.  Patient's 30-day headache frequency/severity profile is at least 20 days/ several    The migraines she had in the past were frontal and were associated with photophobia, photophobia.  Patient did not trial ubrogepant due to cost    Over the weekend on 6/24 patient went to [facility] emergency room for severe headache.  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      Patient has an MRI scheduled for July 7 at [facility]    She has not seen vascular surgery in over a year, and  is interested in possible stenting if appropriate.      05-17-20    Her home sleep study was normal.   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     She has a several year history of headaches.   States she had headaches in her 60s.   They went away and came back.   Pain starts in the back of her head and moves to the front.   Tension or tightness.   Can be on either side.   Awakens her from sleep frequently.   Can worsen into a pulsating headache that lasts all day.   Associated with generalized weakness/fatigue, light and sound sensitivity, nausea.   Can last all day.   She treats with Butalbital-Acetaminophen-Caffeine for acute relief.   Works if "she catches it in time".  Worse with stress.   Ranges from 5-10 days per month.     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   Was seen at [facility].   Recommended non-operative management.   Seen in ED for worsening headaches in 03/2020.   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   Seen by Vascular Surgery in late March.   Carotid duplex recommended.     States she is taking baby aspirin 81 mg.     She was evaluated by Dr. Peggye Pitt at [facility].   Follow up MRI was ordered.  Mychart Hit-6 Headache Impact Test    Question 07/02/2021  8:56 AM EDT - [name] by Patient   When you have headaches, how often is the pain severe? Very Often   How often do headaches limit your ability to do usual daily activities including household work, work, school, or social activities? Sometimes   When you have a headache, how often do you wish you could lie down? Sometimes   In the past 4 weeks, how often have you felt too tired to do work or daily activities because of your headaches? Very Often   In the past 4 weeks, how often have you felt fed up or irritated because of your headaches? Very Often   In the past 4 weeks, how often did headaches limit your ability to concentrate on work or daily activities? Very Often   HIT-6 Total Score (range: 36 - 78) 64 Critical        Previous Preventive Medications:  *Topiramate    Previous Acute  Medications:  Acetaminophen  Butalbital-Acetaminophen-Caffeine  sumatriptan    Review of Systems:    Negative fever, chest pain, SOB   All other systems reviewed and are negative except pertinent positives as noted above in HPI.      Past Medical History:  Past Medical History:   Diagnosis Date    Anemia     Arthritis     Carotid stenosis     Diarrhea 12/2016    Since I had my dual switch surgery    Fatty liver 2017    GERD (gastroesophageal reflux disease)     Hepatic cirrhosis     Hiatal hernia     Hyperthyroidism 2013    Thyroid removed    Sleep apnea     not using CPAP machine    Vitamin D deficiency      Past Surgical History:  Past Surgical History:   Procedure Laterality Date    CHOLECYSTECTOMY  12/2016    COLONOSCOPY, BIOPSY N/A 01/27/2018    Procedure: COLONOSCOPY, BIOPSY;  Surgeon: Shanon Brow, MD;  Location: [facility];  Service: Gastroenterology;  Laterality: N/A;    DUODENAL SWITCH  12/2016    EGD, BIOPSY N/A 01/27/2018    Procedure: EGD, BIOPSY;  Surgeon: Shanon Brow, MD;  Location: [facility];  Service: Gastroenterology;  Laterality: N/A;    GASTRECTOMY  09/2013    HYSTERECTOMY  2015    Partial.    LAPAROSCOPIC REVISION SLEEVE GASTRECTOMY TO GASTRIC BYPASS      LAPAROSCOPIC, GASTRECTOMY SLEEVE      SALPINGO OOPHERECTOMY Bilateral     THYROIDECTOMY  2013     Allergies:  Bee pollen and Vicodin [hydrocodone-acetaminophen]    Family History:  Family History   Problem Relation Age of Onset    Cancer Mother         hx ovarian    Hypertension Mother     Inflammatory bowel disease Mother     Heart failure Mother     Angina Mother     Migraines Mother     Asthma Father     Diabetes Father     Hyperlipidemia Father     Hypertension Father     Cancer Father         Currently has cancer    Crohn's disease Father     Asthma Sister     Diabetes Sister     Hyperlipidemia Sister     Hypertension Sister     Early  death Sister     Cancer Brother         hx of stomach    Hyperlipidemia Brother      Hypertension Brother     Migraines Brother     Learning disabilities Brother         Deaf    Breast cancer Paternal Aunt         died of     Breast cancer Other         maternal cousin-- survior    Obesity Son    No other family history related to current complaints.    Social History:  Social History     Tobacco Use    Smoking status: Never    Smokeless tobacco: Never    Tobacco comments:     None   Vaping Use    Vaping Use: Never used   Substance Use Topics    Alcohol use: No    Drug use: Not Currently       Medications:  Current Outpatient Medications   Medication Sig Dispense Refill    Armour Thyroid 15 MG tablet Take 1 tablet by mouth once daily 30 tablet 5    Armour Thyroid 60 MG tablet TAKE 1 TABLET BY MOUTH EVERY DAY 30 tablet 5    Beclomethasone Dipropionate (Qnasl) 80 MCG/ACT Aero Soln 2 sprays by Nasal route daily 3 Inhaler 1    Biotin 5 MG/ML Liquid Take by mouth daily         butalbital-acetaminophen-caffeine (FIORICET) 50-325-40 MG per tablet Take 1 tablet by mouth every 4 (four) hours as needed for Headaches 15 tablet 0    calcium carbonate 1500 (600 Ca) MG Tab tablet Take 1 tablet (600 mg) by mouth      cetirizine (ZyrTEC Allergy) 10 MG tablet Take 1 tablet (10 mg total) by mouth daily 90 tablet 0    diclofenac Sodium (VOLTAREN) 1 % Gel topical gel Apply 4 g to affected area 4 times daily as needed.  Max: 16 g/joint/day.  Do not apply to broken, inflamed, or infected skin. 100 g 1    montelukast (SINGULAIR) 10 MG tablet TAKE 1 TABLET(10 MG) BY MOUTH EVERY NIGHT 90 tablet 0    Multiple Vitamins-Minerals (MULTIVITAMIN WITH MINERALS) tablet Take 1 tablet by mouth daily      Spacer/Aero-Holding Chambers (AEROCHAMBER Z-STAT PLUS/MEDIUM) inhaler Use as instructed 1 each 0    vitamin D (CHOLECALCIFEROL) 25 MCG (1000 UT) tablet Take 1 tablet (1,000 Units) by mouth daily      albuterol sulfate HFA (PROVENTIL) 108 (90 Base) MCG/ACT inhaler Inhale 2 puffs into the lungs every 4 (four) hours as needed for  Wheezing or Shortness of Breath (cough) (Patient not taking: Reported on 05/08/2021) 1 Inhaler 0    Cyanocobalamin 500 MCG/0.1ML Solution 1 spray via nares weekly (Patient not taking: Reported on 07/02/2021) 1 Bottle 3    gabapentin (Neurontin) 300 MG capsule Take 1 capsule (300 mg) by mouth nightly 30 capsule 2     No current facility-administered medications for this visit.       General Exam:  Ht 1.6 m (5\' 3" )   Wt 60.3 kg (133 lb)   LMP  (LMP Unknown)   BMI 23.56 kg/m     General Exam:  Gen: pleasant, NAD. Appears stated age.  HEENT: sclera non-icteric.  Respiratory: no respiratory distress.   Mental Status: alert, appropriate, speech fluent, follows commands.  CN: EOM intact, face symmetric, hearing intact to voice.  Gait: normal    Investigations:  Labs:  Lab Results   Component Value Date    WBC 3.63 05/08/2021    HGB 10.1 (L) 05/08/2021    HCT 33.0 (L) 05/08/2021    MCV 88.0 05/08/2021    PLT 204 05/08/2021     '  Chemistry        Component Value Date/Time    NA 143 08/03/2020 0937    K 3.5 08/03/2020 0937    CL 109 08/03/2020 0937    CO2 27 08/03/2020 0937    BUN 15.0 08/03/2020 0937    CREAT 0.9 08/03/2020 0937    GLU 85 08/03/2020 0937        Component Value Date/Time    CA 8.6 08/03/2020 0937    ALKPHOS 125 (H) 08/03/2020 0937    AST 26 08/03/2020 0937    ALT 19 08/03/2020 0937    BILITOTAL 0.8 08/03/2020 0937        Imaging:  CT Head Without Contrast 03/23/2020:  IMPRESSION  :No acute intracranial hemorrhage or mass effect is  identified.    CTA Head/Neck 03/23/2020:  IMPRESSION:   1. A right carotid web is present.   2. There is no stenosis of the right common carotid artery bifurcation  and proximal right internal carotid artery carotid artery  based on  NASCET criteria.    3. There is no stenosis of the left common carotid artery bifurcation  and proximal left internal carotid artery carotid artery  based on  NASCET criteria.  4. The vertebral arteries are patent.  5. Unremarkable CTA of the  brain.    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 Follow-up MRI of the brain with and without contrast is  recommended in 3 months.         Imaging Results - CT CTA HEAD, NECK (06/29/2021 3:51 PM EDT)  Impressions   06/29/2021 4:27 PM EDT    IMPRESSION:  1. Normal CT angio head. No intracranial large vessel occlusion or  stenosis  2. No significant carotid or vertebral artery stenosis in the neck  3. 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  Possible dissection or web.      Electronically Signed   By: Marlan Palau M.D.   On: 06/29/2021 16:27           Imaging Results - CT CTA HEAD, NECK (06/29/2021 3:51 PM EDT)  Procedure Note   Camelia Phenes, MD - 06/29/2021    Formatting of this note might be different from the original.  CLINICAL DATA: Headache.    EXAM:  CT ANGIOGRAPHY HEAD AND NECK    TECHNIQUE:  Multidetector CT imaging of the head and neck was performed using  the standard protocol during bolus administration of intravenous  contrast. Multiplanar CT image reconstructions and MIPs were  obtained to evaluate the vascular anatomy. Carotid stenosis  measurements (when applicable) are obtained utilizing NASCET  criteria, using the distal internal carotid diameter as the  denominator.    RADIATION DOSE REDUCTION: This exam was performed according to the  departmental dose-optimization program which includes automated  exposure control, adjustment of the mA and/or kV according to  patient size and/or use of iterative reconstruction technique.    CONTRAST: 80 mL Omnipaque 350 IV    COMPARISON: None Available.  FINDINGS:  CT HEAD FINDINGS    Brain: No evidence of acute infarction, hemorrhage, hydrocephalus,  extra-axial collection or mass lesion/mass effect.    Vascular: Negative for hyperdense vessel    Skull: Negative    Sinuses:  Paranasal sinuses clear. Mastoid clear.    Orbits: Negative    Review of the MIP images confirms the above findings    CTA NECK FINDINGS    Aortic arch: Normal aortic arch. Proximal great vessels patent. Left  vertebral artery origin from the arch.    Right carotid system: Right carotid widely patent. Small web-like  indentation of the proximal right internal carotid artery without  significant stenosis. Possible small dissection or web.    Left carotid system: Mild atherosclerotic calcification left carotid  bifurcation without stenosis.    Vertebral arteries: Left vertebral artery origin from the arch. Both  vertebral arteries patent to the skull base without stenosis.    Skeleton: No focal skeletal abnormality.    Other neck: Negative for mass or adenopathy in the neck.    Upper chest: Lung apices clear bilaterally.    Review of the MIP images confirms the above findings    CTA HEAD FINDINGS    Anterior circulation: Internal carotid artery widely patent through  the skull base and cavernous segments. No calcification. Anterior  and middle cerebral arteries normal bilaterally.    Posterior circulation: Both vertebral arteries patent to the  basilar. PICA patent bilaterally. Basilar patent. Superior  cerebellar and posterior cerebral arteries patent. No stenosis or  occlusion.    Venous sinuses: Normal venous enhancement    Anatomic variants: None    Review of the MIP images confirms the above findings    IMPRESSION  IMPRESSION:  1. Normal CT angio head. No intracranial large vessel occlusion or  stenosis  2. No significant carotid or vertebral artery stenosis in the neck  3. 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  Possible dissection or web.      Electronically Signed  By: Marlan Palau M.D.  On: 06/29/2021 16:27       Other Studies:  N/a    Records Reviewed:  [facility] Reining ED notes, 06/29/21  Dr. Quincy Simmonds DO 06/25/21  Dr. Clarene Duke notes 05/17/20  Vascular surgery note from  03/29/2020      Impression:      Dajana Gehrig is a 56 y.o. female who presents for follow up of headaches, carotid artery irregularity.     -Patients occipital severe stabbing, shooting headaches, associated with cutaneous allodynia are consistent with bilateral occipital neuralgia     -the carotid irregularity (web vs. Chronic dissection) is incidental.      Plan/ Recommendations:    1. Bilateral occipital neuralgia  - gabapentin (Neurontin) 300 MG capsule; Take 1 capsule (300 mg) by mouth nightly  Dispense: 30 capsule; Refill: 2  - Occipital Nerve Block; Future    2. Carotid artery disorder  - Ambulatory referral to Vascular Surgery; Future    3. Intractable migraine without aura and without status migrainosus  - gabapentin (Neurontin) 300 MG capsule; Take 1 capsule (300 mg) by mouth nightly  Dispense: 30 capsule; Refill: 2  - Occipital Nerve Block; Future    4. White matter abnormality on MRI of brain      -- Begin Gabapentin 300mg  for headache prevention  --  Will begin ONB prior authorization  -- May continue Magnesium and Riboflavin for migraine prevention  -- Would avoid triptans and DHE due to  vascular lesion  -- proceed with MRI in July  -- referral placed to vascular surgery for carotid irregularity  -- Continue aspirin    Discussed the importance of lifestyle modifications:  - sleep hygiene ( keep a regular sleep schedule) maintaining appropriate hydration,   - avoid overuse of caffeine,   - eat regular meals - minimizing processed foods and emphasizing lean proteins and fruits and vegetables.     --Keep headache diary, record frequency of headache, location, duration, intensity, preceding symptoms (aura occurrence), triggers and pain relieving measures.  -- recommend Migraine Buddy app     -Remember to limit acute headache treatment medicines (like triptans or anti-inflammatories) to 2-3 days/week to avoid medication-overuse headache.      The patient should seek emergent care if there is any change  in the symptoms. Proper use and all side effects of medications discussed    RTC for ONB sooner if needed      Please contact me with any questions. Patients and [place] Providers can reach me via MyChart.      45  minutes were spent on the day of service including face to face time with patient, coordinating care, record review, and documentation.       Dr. Adonis Huguenin was available in a supervisory role    Lowanda Foster FNP-C  [facility]   [facility]: [phone])- [phone]  [place]: [phone])- [phone]

## 2021-07-02 NOTE — Patient Instructions (Addendum)
Our plan:     -- Begin Gabapentin 300mg  for headache prevention  --  Will begin ONB prior authorization  -- May continue Magnesium and Riboflavin for migraine prevention  -- Would avoid triptans and DHE due to vascular lesion  -- proceed with MRI in July  -- referral placed to vascular surgery for carotid irregularity  -- Continue aspirin    Here is information on occipital nerve blocks    [email or web address]      Discussed the importance of lifestyle modifications:  - sleep hygiene ( keep a regular sleep schedule) maintaining appropriate hydration,   - avoid overuse of caffeine,   - eat regular meals - minimizing processed foods and emphasizing lean proteins and fruits and vegetables.     --Keep headache diary, record frequency of headache, location, duration, intensity, preceding symptoms (aura occurrence), triggers and pain relieving measures.  -- recommend Migraine Buddy app     -Remember to limit acute headache treatment medicines (like triptans or anti-inflammatories) to 2-3 days/week to avoid medication-overuse headache.         For migraine education resources:  American Headache Society: https://americanheadachesociety.org  American Migraine Foundation: https://americanmigrainefoundation.org    Today's Visit:      In today's visit, I reviewed your medications and records relating your health - prior testing, blood work, reports of other health care providers present in your electronic medical record.     If you have pertinent records from any non-[place] doctors that you would like to review, please have them sent to [place] or bring to them to your next office visit.     A copy of today's visit will be sent to your referring doctor and/or primary care doctor, if you have one listed in our system.      Patient satisfaction survey:      If you receive a patient satisfaction survey, I would greatly appreciate it if you would complete it.  We value your feedback.     Contact me online:      Patient Portal online - Please sign up for MyChart -- this is the best way to communicate with your team here.  There is a messaging feature, where you can [place] messages anytime of day.  It is the best way to communicate with [place] and get test results, medication refills, or ask questions.    If you are having a medical emergency -- call 911, DO NOT SEND A MESSAGE THROUGH MYCHART.     Coupons for medication:      If you have any trouble affording your medications, check out www.goodrx.com for coupons and competitive prices in your area.  If you need further assistance, let us know so we can work with you and your insurance to make sure you get the best care.    Lowanda Foster FNP-C  [facility]   [facility]: [phone])- [phone]  : [phone])- [phone]

## 2021-07-12 ENCOUNTER — Ambulatory Visit
Admission: RE | Admit: 2021-07-12 | Discharge: 2021-07-12 | Disposition: A | Discharge status: Home / Self Care | Payer: 59 | Source: Ambulatory Visit | Attending: Family Medicine | Admitting: Family Medicine

## 2021-07-12 DIAGNOSIS — R9082 White matter disease, unspecified: Secondary | ICD-10-CM | POA: Insufficient documentation

## 2021-07-12 MED ORDER — GADOTERATE MEGLUMINE 5 MMOL/10ML IV SOLN (CLARISCAN)
10.0000 mL | Freq: Once | INTRAVENOUS | Status: AC | PRN
Start: 2021-07-12 — End: 2021-07-12
  Administered 2021-07-12: 10 mL via INTRAVENOUS

## 2021-08-19 ENCOUNTER — Other Ambulatory Visit (INDEPENDENT_AMBULATORY_CARE_PROVIDER_SITE_OTHER): Payer: Self-pay | Admitting: "Endocrinology

## 2021-08-19 DIAGNOSIS — E890 Postprocedural hypothyroidism: Secondary | ICD-10-CM

## 2021-08-20 ENCOUNTER — Ambulatory Visit: Payer: 59 | Attending: Neurology | Admitting: Neurology

## 2021-08-20 ENCOUNTER — Encounter: Payer: Self-pay | Admitting: Neurology

## 2021-08-20 ENCOUNTER — Other Ambulatory Visit: Payer: Self-pay | Admitting: "Endocrinology

## 2021-08-20 VITALS — Ht 63.0 in | Wt 131.0 lb

## 2021-08-20 DIAGNOSIS — M5481 Occipital neuralgia: Secondary | ICD-10-CM

## 2021-08-20 DIAGNOSIS — E890 Postprocedural hypothyroidism: Secondary | ICD-10-CM

## 2021-08-20 DIAGNOSIS — I779 Disorder of arteries and arterioles, unspecified: Secondary | ICD-10-CM

## 2021-08-20 DIAGNOSIS — G43009 Migraine without aura, not intractable, without status migrainosus: Secondary | ICD-10-CM

## 2021-08-20 DIAGNOSIS — R9082 White matter disease, unspecified: Secondary | ICD-10-CM

## 2021-08-20 MED ORDER — GABAPENTIN 100 MG PO CAPS
100.0000 mg | ORAL_CAPSULE | Freq: Every day | ORAL | 2 refills | Status: DC | PRN
Start: 2021-08-20 — End: 2022-06-17

## 2021-08-20 NOTE — Telephone Encounter (Signed)
Name, strength, directions of requested refill(s):    Armour Thyroid 60 MG tablet  Armour Thyroid 15 MG tablet    How much medication is remaining: 3    Pharmacy to send refill to or patient to pick up rx from office (mark requested pharmacy in BOLD):      [facility] [address] [place] - [address]  [address]  [place] [address]  Phone: [phone] Fax: [phone]    [id number] [place], [place] - [address]  [address]  [facility] [place] [address]  Phone: [phone] Fax: [phone]    [id number] - [place], [place] - [address] AT [facility] [address] [facility] [address]  [address] [place] [address]  Phone: [phone] Fax: [phone]        Please mark "X" next to the preferred call back number:    Mobile: [phone] (mobile) X   Home: [phone] (home)    Work: @WORKPHONE @        Medication refill request, see above. Thank you   Patient has been informed that medication refill requests should be called in up to one week prior to running out of medication.    Additional Notes:  Next Visit:08/21/2020

## 2021-08-20 NOTE — Telephone Encounter (Signed)
Follow up none  Last visit 08/21/2020

## 2021-08-20 NOTE — Progress Notes (Signed)
08/20/2021    Verbal consent has been obtained from the patient to conduct a telemedicine visit encounter to minimize exposure to COVID-19: Yes  A two-way, synchronous, real- time, audio and visual interactive communication system between the patient and myself was utilized.     Patient ID: Tracy [name]    Chief Complaint:    Chief Complaint   Patient presents with    Migraine     History of Present Illness:  Tracy [name] is a 56 y.o. female who presents for follow up of headache, neck pain.  She was last seen by me on 05/17/2020.  At that I recommended starting Magnesium and Riboflavin, trial of Ubrogepant, review of planned follow up MRI, continuation of aspirin. In the interim she saw NP [facility] on 07/02/2021. At that time, she diagnosed her with occipital neuralgia, chronic carotid irregularity.  She was started on Gabapentin, referred her to Vascular Surgery, and recommended consideration of occipital nerve block.     She is still noticing stabbing headache pain 3-4 days per month.   Has been treating with Sumatriptan (from another provider).   Had some sedation on Gabapentin 300 mg qHS dose.   Has not been able to get appointment with her previous Vascular Surgeon.   Interested in another opinion.     Interval History 07/02/2021:  The headaches start in the occipital, sub occipital, and travels up and to the temporal area.   Says these are different than the migraines she has had in the past.   Pain starts usually on the right, and then encompasses the entire back of her head.  She describes the pain as severe. Pain be shooting or stabbing.  The area is sensitive to the touch.  At night the pain is more of a throbbing, pulsating. She has to sleep with a certain type of pillow for comfort as the entire back of head is painful  She denies photophobia, photophobia. Patient does report intermittent blurry vision of the right eye.  Patient's 30-day headache frequency/severity profile is at least 20 days/  several  The migraines she had in the past were frontal and were associated with photophobia, photophobia.  Patient did not trial ubrogepant due to cost  Over the weekend on 6/24 patient went to [facility] emergency room for severe headache.  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    Patient has an MRI scheduled for July 7 at [facility]  She has not seen vascular surgery in over a year, and  is interested in possible stenting if appropriate.    History Retained from Initial 05/17/2020 Visit:  Her home sleep study was normal.   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   She has a several year history of headaches.   States she had headaches in her 43s.   They went away and came back.   Pain starts in the back of her head and moves to the front.   Tension or tightness.   Can be on either side.   Awakens her from sleep frequently.   Can worsen into a pulsating headache that lasts all day.   Associated with generalized weakness/fatigue, light and sound sensitivity, nausea.   Can last all day.   She treats with Butalbital-Acetaminophen-Caffeine  for acute relief.   Works if "she catches it in time".  Worse with stress.   Ranges from 5-10 days per month.   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   Was seen at [facility].   Recommended non-operative management.   Seen in ED for worsening headaches in 03/2020.   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   Seen by Vascular Surgery in late March.   Carotid duplex recommended.   States she is taking baby aspirin 81 mg.   She was evaluated by Dr. Peggye Pitt at [facility].   Follow up MRI was ordered.     Previous  Preventive Medications:  Gabapentin  Topiramate    Previous Acute Medications:  Acetaminophen  Butalbital-Acetaminophen-Caffeine  Sumatriptan  Ubrogepant - prescribed but didn't try due to cost    Past Medical History:  Past Medical History:   Diagnosis Date    Anemia     Arthritis     Carotid stenosis     Diarrhea 12/2016    Since I had my dual switch surgery    Fatty liver 2017    GERD (gastroesophageal reflux disease)     Hepatic cirrhosis     Hiatal hernia     Hyperthyroidism 2013    Thyroid removed    Sleep apnea     not using CPAP machine    Vitamin D deficiency      Past Surgical History:  Past Surgical History:   Procedure Laterality Date    CHOLECYSTECTOMY  12/2016    COLONOSCOPY, BIOPSY N/A 01/27/2018    Procedure: COLONOSCOPY, BIOPSY;  Surgeon: Shanon Brow, MD;  Location: [facility];  Service: Gastroenterology;  Laterality: N/A;    DUODENAL SWITCH  12/2016    EGD, BIOPSY N/A 01/27/2018    Procedure: EGD, BIOPSY;  Surgeon: Shanon Brow, MD;  Location: [facility];  Service: Gastroenterology;  Laterality: N/A;    GASTRECTOMY  09/2013    HYSTERECTOMY  2015    Partial.    LAPAROSCOPIC REVISION SLEEVE GASTRECTOMY TO GASTRIC BYPASS      LAPAROSCOPIC, GASTRECTOMY SLEEVE      SALPINGO OOPHERECTOMY Bilateral     THYROIDECTOMY  2013     Allergies:  Bee pollen and Vicodin [hydrocodone-acetaminophen]    Family History:  Family History   Problem Relation Age of Onset    Cancer Mother         hx ovarian    Hypertension Mother     Inflammatory bowel disease Mother     Heart failure Mother     Angina Mother     Migraines Mother     Asthma Father     Diabetes Father     Hyperlipidemia Father     Hypertension Father     Cancer Father         Currently has cancer    Crohn's disease Father     Asthma Sister     Diabetes Sister     Hyperlipidemia Sister     Hypertension Sister     Early death Sister     Cancer Brother         hx of stomach    Hyperlipidemia Brother     Hypertension Brother      Migraines Brother     Learning disabilities Brother         Deaf    Breast cancer Paternal Aunt  died of     Breast cancer Other         maternal cousin-- survior    Obesity Son    No other family history related to current complaints.    Social History:  Social History     Tobacco Use    Smoking status: Never    Smokeless tobacco: Never    Tobacco comments:     None   Vaping Use    Vaping Use: Never used   Substance Use Topics    Alcohol use: No    Drug use: Not Currently     Medications:  Current Outpatient Medications   Medication Sig Dispense Refill    Armour Thyroid 15 MG tablet Take 1 tablet by mouth once daily 30 tablet 5    Armour Thyroid 60 MG tablet TAKE 1 TABLET BY MOUTH EVERY DAY 30 tablet 5    Beclomethasone Dipropionate (Qnasl) 80 MCG/ACT Aero Soln 2 sprays by Nasal route daily 3 Inhaler 1    Biotin 5 MG/ML Liquid Take by mouth daily         butalbital-acetaminophen-caffeine (FIORICET) 50-325-40 MG per tablet Take 1 tablet by mouth every 4 (four) hours as needed for Headaches 15 tablet 0    calcium carbonate 1500 (600 Ca) MG Tab tablet Take 1 tablet (600 mg) by mouth      cetirizine (ZyrTEC Allergy) 10 MG tablet Take 1 tablet (10 mg total) by mouth daily 90 tablet 0    diclofenac Sodium (VOLTAREN) 1 % Gel topical gel Apply 4 g to affected area 4 times daily as needed.  Max: 16 g/joint/day.  Do not apply to broken, inflamed, or infected skin. 100 g 1    gabapentin (Neurontin) 300 MG capsule Take 1 capsule (300 mg) by mouth nightly 30 capsule 2    montelukast (SINGULAIR) 10 MG tablet TAKE 1 TABLET(10 MG) BY MOUTH EVERY NIGHT 90 tablet 0    Multiple Vitamins-Minerals (MULTIVITAMIN WITH MINERALS) tablet Take 1 tablet by mouth daily      Spacer/Aero-Holding Chambers (AEROCHAMBER Z-STAT PLUS/MEDIUM) inhaler Use as instructed 1 each 0    vitamin D (CHOLECALCIFEROL) 25 MCG (1000 UT) tablet Take 1 tablet (1,000 Units) by mouth daily      albuterol sulfate HFA (PROVENTIL) 108 (90 Base) MCG/ACT inhaler  Inhale 2 puffs into the lungs every 4 (four) hours as needed for Wheezing or Shortness of Breath (cough) (Patient not taking: Reported on 05/08/2021) 1 Inhaler 0     No current facility-administered medications for this visit.     General Exam:  Ht 1.6 m (5\' 3" )   Wt 59.4 kg (131 lb)   LMP  (LMP Unknown)   BMI 23.21 kg/m   Neuro Exam:    MENTAL STATUS:  Awake, alert, oriented.  Attentive.  Follows commands.  Speech fluent, non-dysarthric, with normal naming.   CRANIAL NERVES:  EOMI.  Face symmetric.  Hearing grossly intact.  Shoulders symmetric.   MOTOR:  Moves all extremities symmetrically antigravity.  No adventitious movements.   COORDINATION:  No dysmetria.   GAIT:  Normal gait and station.     Investigations:  Labs Reviewed:  Lab Results   Component Value Date    WBC 3.63 05/08/2021    HGB 10.1 (L) 05/08/2021    HCT 33.0 (L) 05/08/2021    MCV 88.0 05/08/2021    PLT 204 05/08/2021     '  Chemistry        Component Value Date/Time  NA 143 08/03/2020 0937    K 3.5 08/03/2020 0937    CL 109 08/03/2020 0937    CO2 27 08/03/2020 0937    BUN 15.0 08/03/2020 0937    CREAT 0.9 08/03/2020 0937    GLU 85 08/03/2020 0937        Component Value Date/Time    CA 8.6 08/03/2020 0937    ALKPHOS 125 (H) 08/03/2020 0937    AST 26 08/03/2020 0937    ALT 19 08/03/2020 0937    BILITOTAL 0.8 08/03/2020 0937        Imaging:  MRI Brain With and Without Contrast 07/12/2021:  IMPRESSION:   No new abnormality seen. Stable size of small nonspecific focus  of white matter change in the right cerebral hemisphere.     CTA Head/Neck 06/29/2021:  IMPRESSION:   1. Normal CT angio head. No intracranial large vessel occlusion or   stenosis   2. No significant carotid or vertebral artery stenosis in the neck   3. 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   Possible dissection or web.     Other Studies:  N/a    Records Reviewed:  ED notes 06/29/2021    Impression:  Starling Jessie is a 56 y.o. female who presents for follow  up of headaches, carotid artery irregularity.  Patient has a long standing history of migraine.  Current headaches are clinically consistent with occipital neuralgia.  As she is noting some side effects on Gabapentin 300 mg, will decrease dose.  I feel the carotid irregularity (web vs. Chronic dissection) is incidental.      Recommendations:  1. Occipital neuralgia  2. Migraine without aura and without status migrainosus, not intractable  -Decrease Gabapentin to 100 mg daily prn  -Would avoid triptans and DHE due to vascular lesion  -Patient was counseled via AVS to follow the SEEDS for success in headache management, including Sleep hygiene, Exercising regularly, Eating healthy and regular meals, Drinking water, keeping a headache Diary, and Stress reduction.  -Patient was counseled via AVS on the risk of acute medication overuse headache and was advised to limit acute medication use to no more than 3 days per week    3. White matter abnormality on MRI of brain  -Stable across serial scans    4. Carotid artery disorder  -Also stable across serial scans  -Continue aspirin  -She will set up follow up with vascular surgery    Follow up with [place] after vascular surgery evaluation.     Adonis Huguenin, MD  [facility]  Board Certified in Adult Neurology by the American Board of Psychiatry and Neurology (ABPN)  Board Certified in Headache Medicine by the [employer] (UCNS)  08/20/2021

## 2021-08-20 NOTE — Patient Instructions (Addendum)
-  Follow the SEEDS for success in headache management, including Sleep hygiene, Exercising regularly, Eating healthy and regular meals, Drinking water, keeping a headache Diary, and Stress reduction.     -Decrease Gabapentin to 100 mg up to once per day as needed for breakthrough pain    -Stop using the Sumatriptan    -Set up follow up with Vascular Surgery    -Follow up with [place] after Vascular Surgery evaluation

## 2021-08-21 MED ORDER — THYROID 60 MG PO TABS
60.0000 mg | ORAL_TABLET | Freq: Every day | ORAL | 2 refills | Status: DC
Start: 2021-08-21 — End: 2022-06-17

## 2021-08-21 MED ORDER — ARMOUR THYROID 15 MG PO TABS
15.0000 mg | ORAL_TABLET | Freq: Every day | ORAL | 2 refills | Status: DC
Start: 2021-08-21 — End: 2022-01-08

## 2021-08-21 NOTE — Telephone Encounter (Signed)
3 month script for Armour thyroid sent to pharmacy. Pt overdue for a follow up appt and needs to be scheduled.

## 2021-08-21 NOTE — Telephone Encounter (Signed)
Called and scheduled for video on 10/27 with Dr. Rivka Safer.

## 2021-11-01 ENCOUNTER — Encounter (INDEPENDENT_AMBULATORY_CARE_PROVIDER_SITE_OTHER): Payer: Self-pay | Admitting: "Endocrinology

## 2021-11-01 ENCOUNTER — Telehealth (INDEPENDENT_AMBULATORY_CARE_PROVIDER_SITE_OTHER): Payer: 59 | Admitting: "Endocrinology

## 2021-11-01 VITALS — Ht 62.5 in | Wt 133.0 lb

## 2021-11-01 DIAGNOSIS — R5383 Other fatigue: Secondary | ICD-10-CM

## 2021-11-01 DIAGNOSIS — E890 Postprocedural hypothyroidism: Secondary | ICD-10-CM

## 2021-11-01 DIAGNOSIS — D509 Iron deficiency anemia, unspecified: Secondary | ICD-10-CM

## 2021-11-01 NOTE — Progress Notes (Signed)
Verbal consent has been obtained from the patient to conduct a video visit.  Patient has verified that he/she are physically located in the state of [place] during the time of our appointment.      Chief Complaint:  Chief Complaint   Patient presents with    Hypothyroidism       HPI:  Tracy [name] is a 56 y.o. female with postsurgical hypothyroidism, chronic sinusitis, s/p sleeve gastrectomy (2014, revision in 2018), who returns for follow up.    Tracy [name] is s/p total thyroidectomy in 2013 (benign).      Last OV 08/2020:  She reports chronic mucus buildup like she had prior to having thyroidectomy.  She is concerned about re-growth of goiter which has occurred to other family members.  She c/o chest wall discomfort since covid infection.  She has seen her PCP for this.     She reports chest tightness and mucus production for the last 8 months. She has not had antibiotics for this.  She reports problems with sinuses.     Patient is taking Armour thyroid 60mg  daily + 15mg  daily.  She takes her vitamins within an hour after Armour.  She is taking more vitamins at this time.      She reports decrease in muscle mass/ strength, fatigue mid-day, hypersomnolence in the last two months.    She reports shortness of breath when going up and down stairs, tremors in hands.    Has anemia post bariatric surgery, unable to tolerate PO iron.  Has not seen hematology for this yet.      Labs:  Component      Latest Ref Rng & Units 07/05/2019   TSH  0.35 - 4.94 uIU/mL 4.79   T4 Free 0.70 - 1.48 ng/dL 9.56   T3 Free  2.13 - 3.71 pg/mL 2.10     Component      Latest Ref Rng & Units 08/03/2020   Vitamin D 25-OH Total      30 - 100 ng/mL 38   TSH    0.35 - 4.94 uIU/mL 7.19 (H)   T4 Free   0.70 - 1.48 ng/dL 0.86   T3 Free  5.78 - 3.71 pg/mL 2.91     Component      Latest Ref Rng 08/03/2020 09/18/2020 12/19/2020   TSH   0.35 - 4.94 uIU/mL 7.19 (H)  6.880 (H)  0.80    T4 Free  0.69 - 1.48 ng/dL 4.69  6.29  5.28    T3 Free   1.71 -  3.71 pg/mL 2.91   2.27           Imaging:  Thyroid ultrasound 03/22/20 [facility]: s/p thyroidectomy, no abnormalities    Problem List:  Patient Active Problem List   Diagnosis    Migraine    History of hypertension    Abdominal cyst    Hiatal hernia    Hypokalemia    S/P gastric bypass    Fatty liver    Postsurgical hypothyroidism    Tubular adenoma of colon    Allergic rhinitis    Sleep difficulties    Difficulty concentrating    Forgetfulness    Hx of migraine headaches    Occipital headache    Frontal headache    Hx of concussion    White matter abnormality on MRI of brain    Hx of sleep apnea    Irritable bowel syndrome with diarrhea    Chronic diarrhea  Preventative health care    Normocytic anemia       Current Medications:  Current Outpatient Medications on File Prior to Visit   Medication Sig Dispense Refill    albuterol sulfate HFA (PROVENTIL) 108 (90 Base) MCG/ACT inhaler Inhale 2 puffs into the lungs every 4 (four) hours as needed for Wheezing or Shortness of Breath (cough) 1 Inhaler 0    Armour Thyroid 15 MG tablet Take 1 tablet (15 mg) by mouth daily 30 tablet 2    Beclomethasone Dipropionate (Qnasl) 80 MCG/ACT Aero Soln 2 sprays by Nasal route daily 3 Inhaler 1    Biotin 5 MG/ML Liquid Take by mouth daily         butalbital-acetaminophen-caffeine (FIORICET) 50-325-40 MG per tablet Take 1 tablet by mouth every 4 (four) hours as needed for Headaches 15 tablet 0    calcium carbonate 1500 (600 Ca) MG Tab tablet Take 1 tablet (600 mg) by mouth      cetirizine (ZyrTEC Allergy) 10 MG tablet Take 1 tablet (10 mg total) by mouth daily 90 tablet 0    diclofenac Sodium (VOLTAREN) 1 % Gel topical gel Apply 4 g to affected area 4 times daily as needed.  Max: 16 g/joint/day.  Do not apply to broken, inflamed, or infected skin. 100 g 1    gabapentin (NEURONTIN) 100 MG capsule Take 1 capsule (100 mg) by mouth daily as needed (headache pain) 30 capsule 2    montelukast (SINGULAIR) 10 MG tablet TAKE 1 TABLET(10 MG) BY  MOUTH EVERY NIGHT 90 tablet 0    Multiple Vitamins-Minerals (MULTIVITAMIN WITH MINERALS) tablet Take 1 tablet by mouth daily      Spacer/Aero-Holding Chambers (AEROCHAMBER Z-STAT PLUS/MEDIUM) inhaler Use as instructed 1 each 0    thyroid (Armour Thyroid) 60 MG tablet Take 1 tablet (60 mg) by mouth daily 30 tablet 2    vitamin D (CHOLECALCIFEROL) 25 MCG (1000 UT) tablet Take 1 tablet (1,000 Units) by mouth daily       No current facility-administered medications on file prior to visit.       Allergies:  Allergies   Allergen Reactions    Bee Pollen Swelling     "Bee Stings".Marland KitchenMarland KitchenMarland KitchenStuffy nose; "throat swelling"      Vicodin [Hydrocodone-Acetaminophen] Nausea And Vomiting       Past Medical History:  Past Medical History:   Diagnosis Date    Anemia     Arthritis     Carotid stenosis     Diarrhea 12/2016    Since I had my dual switch surgery    Fatty liver 2017    GERD (gastroesophageal reflux disease)     Hepatic cirrhosis     Hiatal hernia     Hyperthyroidism 2013    Thyroid removed    Sleep apnea     not using CPAP machine    Vitamin D deficiency        Past Surgical History:  Past Surgical History:   Procedure Laterality Date    CHOLECYSTECTOMY  12/2016    COLONOSCOPY, BIOPSY N/A 01/27/2018    Procedure: COLONOSCOPY, BIOPSY;  Surgeon: Shanon Brow, MD;  Location: [facility];  Service: Gastroenterology;  Laterality: N/A;    DUODENAL SWITCH  12/2016    EGD, BIOPSY N/A 01/27/2018    Procedure: EGD, BIOPSY;  Surgeon: Shanon Brow, MD;  Location: [facility];  Service: Gastroenterology;  Laterality: N/A;    GASTRECTOMY  09/2013    HYSTERECTOMY  2015  Partial.    LAPAROSCOPIC REVISION SLEEVE GASTRECTOMY TO GASTRIC BYPASS      LAPAROSCOPIC, GASTRECTOMY SLEEVE      SALPINGO OOPHERECTOMY Bilateral     THYROIDECTOMY  2013       Family History:  Family History   Problem Relation Age of Onset    Cancer Mother         hx ovarian    Hypertension Mother     Inflammatory bowel disease Mother     Heart failure  Mother     Angina Mother     Migraines Mother     Asthma Father     Diabetes Father     Hyperlipidemia Father     Hypertension Father     Cancer Father         Currently has cancer    Crohn's disease Father     Asthma Sister     Diabetes Sister     Hyperlipidemia Sister     Hypertension Sister     Early death Sister     Cancer Brother         hx of stomach    Hyperlipidemia Brother     Hypertension Brother     Migraines Brother     Learning disabilities Brother         Deaf    Breast cancer Paternal Aunt         died of     Breast cancer Other         maternal cousin-- survior    Obesity Son        Social History:  Social History     Socioeconomic History    Marital status: Significant Other   Tobacco Use    Smoking status: Never    Smokeless tobacco: Never    Tobacco comments:     None   Vaping Use    Vaping Use: Never used   Substance and Sexual Activity    Alcohol use: No    Drug use: Not Currently    Sexual activity: Not Currently     Partners: Male     Birth control/protection: Post-menopausal, Rhythm   Other Topics Concern    Dietary supplements / vitamins Yes     Comment: Biotin B12 multi vitamins D3    Anesthesia problems No    Blood thinners No    Pregnant No    Future Children No    Number of Pregnancies? No    Number of children Yes     Comment: 1    Miscarriages / Abortions? No    Eats large amounts No    Excessive Sweets No    Skips meals No    Eats excessive starches No    Snacks or grazes No    Emotional eater No    Eats fried food No    Eats fast food No    Diet Center Yes    Doylene Bode No    LA Weight Loss No    Nutri-System No    Opti-Fast / Medi-Fast Yes    Overeaters Anonymous No    [facility] Yes    TOPS No    Weight Watchers Yes    Atkins Yes    Binging / Purging No    Calorie Counting Yes    Fasting Yes    High Protein Yes    Low Carb Yes    Low Fat No    [facility] Diet No  Slim Fast Yes    [employer] No    Stationary cycle or treadmill Yes    Gym/fitness Classes Yes     Home exercise/video Yes    Swimming Yes    Weight training Yes    Walking or running Yes    Hospitalization No    Hypnosis No    Physical therapy Yes    Psychological therapy Yes    Residential program No    Acutrim No    [place]etta No    Contrave No    Dexatrim Yes    Diethylpropion No    Fastin No    Fen - Phen Yes    Ionamin / Adipex No    Phentermine Yes    Qsymia No    Prozac Yes    Saxenda No    Topamax No    Wellbutrin No    Xenical (Orlistat, Alli) No    Other Med No    No impairment Yes    Walks with cane/crutch Yes    Requires a wheelchair No    Bedridden No    Are you currently being treated for depression? No    Do you snore? No    Are you receiving any medical or psychological services? No    Do you ever wake up at night gasping for breath? No    Do you have or have you been treated for an eating disorder? No    Anyone ever told you that you stop breathing while asleep? No    Do you exercise regularly? Yes    Have you or family member ever have trouble with anesthesia? Yes     Social Determinants of Health     Financial Resource Strain: Medium Risk (06/25/2021)    Overall Financial Resource Strain (CARDIA)     Difficulty of Paying Living Expenses: Somewhat hard   Food Insecurity: Food Insecurity Present (06/25/2021)    Hunger Vital Sign     Worried About Running Out of Food in the Last Year: Never true     Ran Out of Food in the Last Year: Sometimes true   Transportation Needs: No Transportation Needs (06/25/2021)    PRAPARE - Therapist, art (Medical): No     Lack of Transportation (Non-Medical): No   Physical Activity: Sufficiently Active (06/25/2021)    Exercise Vital Sign     Days of Exercise per Week: 6 days     Minutes of Exercise per Session: 60 min   Stress: No Stress Concern Present (06/25/2021)    [employer] of Occupational Health - Occupational Stress Questionnaire     Feeling of Stress : Only a little   Social Connections: Moderately Integrated (05/17/2020)     Social Connection and Isolation Panel [NHANES]     Frequency of Communication with Friends and Family: More than three times a week     Frequency of Social Gatherings with Friends and Family: Once a week     Attends Religious Services: More than 4 times per year     Active Member of [employer] or Organizations: Yes     Attends Banker Meetings: More than 4 times per year     Marital Status: Never married   Intimate Partner Violence: Not At Risk (05/17/2020)    Humiliation, Afraid, Rape, and Kick questionnaire     Fear of Current or Ex-Partner: No     Emotionally Abused: No     Physically Abused:  No     Sexually Abused: No   Housing Stability: High Risk (06/25/2021)    Housing Stability Vital Sign     Unable to Pay for Housing in the Last Year: Yes     Number of Places Lived in the Last Year: 1     Unstable Housing in the Last Year: No         Visit Vitals  Ht 1.588 m (5' 2.5")   Wt 60.3 kg (133 lb)   LMP  (LMP Unknown)   BMI 23.94 kg/m        BP Readings from Last 3 Encounters:   05/08/21 159/85   09/18/20 167/89   08/21/20 149/90        Wt Readings from Last 4 Encounters:   11/01/21 1136 60.3 kg (133 lb)   08/20/21 0805 59.4 kg (131 lb)   07/02/21 0851 60.3 kg (133 lb)   05/08/21 1446 59.4 kg (131 lb)        Physical Exam:  GENERAL APPEARANCE: alert, in no acute distress, well developed, well nourished  PSYCHIATRIC: normal mood, appropriate affect        Assessment/Plan:  Tracy [name] is a 55 y.o. female with    1. Postoperative hypothyroidism    2. Other fatigue    3. Iron deficiency anemia, unspecified iron deficiency anemia type          1. Postoperative hypothyroidism   - Continue Armour 60mg  + 15mg  daily for now  - Check TFTs and adjust Armour as needed  - Follow up with ENT or pulmonary for c/o mucus production in throat, chest discomfort, sinus problems, discussed that symptoms are not related to thyroid since imaging was normal with absent thyroid gland last year    2. Fatigue  - Check labs  including cortisol, CBC, iron profile  - Referred to hematology for iron deficiency anemia unable to tolerate PO        Orders Placed This Encounter    T3, free    T4, free    TSH    Comprehensive metabolic panel    Cortisol    Ferritin    CBC without differential    IRON PROFILE          There are no discontinued medications.     Return in about 6 months (around 05/03/2022).    Zerita Boers, MD

## 2021-11-05 ENCOUNTER — Telehealth (INDEPENDENT_AMBULATORY_CARE_PROVIDER_SITE_OTHER): Payer: Self-pay

## 2021-11-05 ENCOUNTER — Other Ambulatory Visit: Payer: 59

## 2021-11-05 DIAGNOSIS — E890 Postprocedural hypothyroidism: Secondary | ICD-10-CM

## 2021-11-05 DIAGNOSIS — D509 Iron deficiency anemia, unspecified: Secondary | ICD-10-CM

## 2021-11-05 DIAGNOSIS — R5383 Other fatigue: Secondary | ICD-10-CM

## 2021-11-05 LAB — CBC
Absolute NRBC: 0 10*3/uL (ref 0.00–0.00)
Hematocrit: 31.8 % — ABNORMAL LOW (ref 34.7–43.7)
Hgb: 9.6 g/dL — ABNORMAL LOW (ref 11.4–14.8)
MCH: 24.9 pg — ABNORMAL LOW (ref 25.1–33.5)
MCHC: 30.2 g/dL — ABNORMAL LOW (ref 31.5–35.8)
MCV: 82.6 fL (ref 78.0–96.0)
MPV: 12.3 fL (ref 8.9–12.5)
Nucleated RBC: 0 /100 WBC (ref 0.0–0.0)
Platelets: 232 10*3/uL (ref 142–346)
RBC: 3.85 10*6/uL — ABNORMAL LOW (ref 3.90–5.10)
RDW: 17 % — ABNORMAL HIGH (ref 11–15)
WBC: 3.05 10*3/uL — ABNORMAL LOW (ref 3.10–9.50)

## 2021-11-05 LAB — COMPREHENSIVE METABOLIC PANEL
ALT: 117 U/L — ABNORMAL HIGH (ref 0–55)
AST (SGOT): 162 U/L — ABNORMAL HIGH (ref 5–41)
Albumin/Globulin Ratio: 1.7 (ref 0.9–2.2)
Albumin: 3.7 g/dL (ref 3.5–5.0)
Alkaline Phosphatase: 145 U/L — ABNORMAL HIGH (ref 37–117)
Anion Gap: 8 (ref 5.0–15.0)
BUN: 12 mg/dL (ref 7.0–21.0)
Bilirubin, Total: 0.6 mg/dL (ref 0.2–1.2)
CO2: 24 mEq/L (ref 17–29)
Calcium: 8.6 mg/dL (ref 8.5–10.5)
Chloride: 111 mEq/L (ref 99–111)
Creatinine: 0.8 mg/dL (ref 0.4–1.0)
Globulin: 2.2 g/dL (ref 2.0–3.6)
Glucose: 83 mg/dL (ref 70–100)
Potassium: 3.7 mEq/L (ref 3.5–5.3)
Protein, Total: 5.9 g/dL — ABNORMAL LOW (ref 6.0–8.3)
Sodium: 143 mEq/L (ref 135–145)
eGFR: >60 mL/min/{1.73_m2} (ref >=60)

## 2021-11-05 LAB — HEMOLYSIS INDEX: Hemolysis Index: 0 Index (ref 0–24)

## 2021-11-05 LAB — FERRITIN: Ferritin: 8.9 ng/mL (ref 4.60–204.00)

## 2021-11-05 LAB — CORTISOL: Cortisol: 7.9 ug/dL

## 2021-11-05 LAB — T3, FREE: T3, Free: 2.14 pg/mL (ref 1.71–3.71)

## 2021-11-05 LAB — T4, FREE: T4 Free: 0.66 ng/dL — ABNORMAL LOW (ref 0.69–1.48)

## 2021-11-05 LAB — TSH: TSH: 2.55 u[IU]/mL (ref 0.35–4.94)

## 2021-11-05 NOTE — Telephone Encounter (Signed)
The most recent office notes has been send to Hematologist [phone] per patient requested.

## 2021-11-06 ENCOUNTER — Encounter (INDEPENDENT_AMBULATORY_CARE_PROVIDER_SITE_OTHER): Payer: Self-pay

## 2021-11-07 ENCOUNTER — Telehealth (INDEPENDENT_AMBULATORY_CARE_PROVIDER_SITE_OTHER): Payer: Self-pay | Admitting: Family Medicine

## 2021-11-07 ENCOUNTER — Telehealth: Payer: Self-pay | Admitting: "Endocrinology

## 2021-11-07 LAB — IRON PROFILE
Iron Saturation: 6 % (calc) — ABNORMAL LOW (ref 16–45)
Iron: 22 ug/dL — ABNORMAL LOW (ref 45–160)
TIBC: 400 mcg/dL (calc) (ref 250–450)

## 2021-11-07 NOTE — Telephone Encounter (Signed)
Patient will like for her labs to be reviewed and to have a order for a  US of the liver to be placed today.    Please advise     Thank you

## 2021-11-07 NOTE — Telephone Encounter (Signed)
Note to scheduling agent: This telephone encounter should be sent to the appropriate clinical/nurse pool. This telephone encounter should not be sent as a high/low priority.    This telephone encounter is being sent on behalf of Tracy [name]. The patient has completed their labs and is requesting a telephone call from the clinical staff to further discuss their lab results and have questions answered.      The patient can be reached at: Mobile [phone] (mobile) or Home [phone] (home)      Next Visit is scheduled for: No Appointment scheduled.     Additional Notes: Patient would like to receive a call back today to discuss lab results, patient is asking to speak with Dr. Rivka Safer as she does not think Dr. Rivka Safer nurses will be able to answer her questions.     Thank you

## 2021-11-07 NOTE — Telephone Encounter (Signed)
I have not ordered labs for patient since 05/2021. She has an upcoming appointment scheduled in a few days, will plan to address concerns at that time.

## 2021-11-07 NOTE — Telephone Encounter (Signed)
Message relayed to patient. Thank you.

## 2021-11-08 NOTE — Telephone Encounter (Signed)
I spoke with patient.  Discussed lab results.  Patient already seeing hematology for iron infusions.  She noted the high AST ALT on recent labs.  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  She will follow-up with her primary care doctor for this.  Thyroid hormone levels are in okay range.  There is a little room to increase the dosage if she continues to feel fatigued after receiving iron infusions.  We will discuss this at her future follow-up visit.  She will continue with current dosage of Armour thyroid for now .

## 2021-11-14 ENCOUNTER — Encounter (INDEPENDENT_AMBULATORY_CARE_PROVIDER_SITE_OTHER): Payer: Self-pay | Admitting: Family Medicine

## 2021-11-14 ENCOUNTER — Ambulatory Visit (INDEPENDENT_AMBULATORY_CARE_PROVIDER_SITE_OTHER): Payer: Medicaid Other | Admitting: Family Medicine

## 2021-11-14 VITALS — BP 162/86 | HR 50 | Temp 97.6°F | Resp 12 | Ht 62.5 in | Wt 132.0 lb

## 2021-11-14 DIAGNOSIS — R7989 Other specified abnormal findings of blood chemistry: Secondary | ICD-10-CM

## 2021-11-14 DIAGNOSIS — J329 Chronic sinusitis, unspecified: Secondary | ICD-10-CM

## 2021-11-14 DIAGNOSIS — K760 Fatty (change of) liver, not elsewhere classified: Secondary | ICD-10-CM

## 2021-11-14 LAB — HEMOLYSIS INDEX: Hemolysis Index: 5 Index (ref 0–24)

## 2021-11-14 LAB — IRON PROFILE
Iron Saturation: 7 % — ABNORMAL LOW (ref 15–50)
Iron: 23 ug/dL — ABNORMAL LOW (ref 32–157)
TIBC: 353 ug/dL (ref 265–497)
UIBC: 330 ug/dL (ref 126–382)

## 2021-11-14 LAB — TRANSFERRIN: Transferrin: 339 mg/dL (ref 174–382)

## 2021-11-14 NOTE — Progress Notes (Signed)
Subjective     Tracy [name] is a 56 y.o. female who presents today for   Chief Complaint   Patient presents with    lab results     F/U, Discuss referrals      HPI    Pt had labs recently with endocrinologist, LFTS elevated. Pt denies any abdominal pain, jaundice, or new symptms.   Acute hepatitis Panel negative in 08/15/17  Normal liver US 07/05/2019  CT abdomen 08/2017 showed mild fatty infiltration.  Component      Latest Ref Rng 08/14/2017 08/15/2017 08/16/2017 08/25/2017 01/20/2018 04/07/2019 07/05/2019 08/03/2020 11/05/2021   AST      5 - 41 U/L 47 (H)  377 (H)  171 (H)  54 (H)  45 (H)  30  173 (H)  26  162 (H)    AST        515 (H)           ALT      0 - 55 U/L 37  311 (H)  206 (H)  46  31  26  104 (H)  19  117 (H)    ALT        306 (H)           Alkaline Phosphatase      37 - 117 U/L 102  185 (H)  148 (H)  109 (H)  115 (H)  114 (H)  123 (H)  125 (H)  145 (H)    Alkaline Phosphatase        162 (H)           Bilirubin Total      0.2 - 1.2 mg/dL 1.0  0.8  1.0  0.4  1.5 (H)  0.9  0.8  0.8  0.6    Bilirubin Total        0.6                Pt requesting referral to  ENT  Pt reports constantly has mucus and has coughing in the morning. Pt states she discussed with her endocrinologist and recommended referral to ENT.    Pt also request referral to vascular surgery.  She was referred by neurology in [place], however has not scheduled an appointment and did not seem to know contact information for vascular.     Problem   Hepatic Steatosis    CT abdomen 08/2017 showed mild fatty infiltration.  Acute hepatitis Panel negative in 08/15/17  Normal liver US 07/05/2019  Lab Results   Component Value Date    ALT 117 (H) 11/05/2021    AST 162 (H) 11/05/2021    ALKPHOS 145 (H) 11/05/2021    BILITOTAL 0.6 11/05/2021   FIB-4 Calculation: 3.55 at 11/14/2021 12:40 PM  Calculated from:  SGOT/AST: 162 U/L at 11/05/2021 11:42 AM  SGPT/ALT: 117 U/L at 11/05/2021 11:42 AM  Platelets: 232 x10 3/uL at 11/05/2021 11:42 AM  Age: 21 years                Patient Active Problem List   Diagnosis    Migraine    History of hypertension    Abdominal cyst    Hiatal hernia    Hypokalemia    S/P gastric bypass    Hepatic steatosis    Postsurgical hypothyroidism    Tubular adenoma of colon    Allergic rhinitis    Sleep difficulties    Difficulty concentrating    Forgetfulness    Hx of migraine headaches  Occipital headache    Frontal headache    Hx of concussion    White matter abnormality on MRI of brain    Hx of sleep apnea    Irritable bowel syndrome with diarrhea    Chronic diarrhea    Preventative health care    Normocytic anemia     Current Outpatient Medications   Medication Sig Dispense Refill    albuterol sulfate HFA (PROVENTIL) 108 (90 Base) MCG/ACT inhaler Inhale 2 puffs into the lungs every 4 (four) hours as needed for Wheezing or Shortness of Breath (cough) 1 Inhaler 0    Armour Thyroid 15 MG tablet Take 1 tablet (15 mg) by mouth daily 30 tablet 2    Beclomethasone Dipropionate (Qnasl) 80 MCG/ACT Aero Soln 2 sprays by Nasal route daily 3 Inhaler 1    Biotin 5 MG/ML Liquid Take by mouth daily         butalbital-acetaminophen-caffeine (FIORICET) 50-325-40 MG per tablet Take 1 tablet by mouth every 4 (four) hours as needed for Headaches 15 tablet 0    calcium carbonate 1500 (600 Ca) MG Tab tablet Take 1 tablet (600 mg) by mouth      cetirizine (ZyrTEC Allergy) 10 MG tablet Take 1 tablet (10 mg total) by mouth daily 90 tablet 0    diclofenac Sodium (VOLTAREN) 1 % Gel topical gel Apply 4 g to affected area 4 times daily as needed.  Max: 16 g/joint/day.  Do not apply to broken, inflamed, or infected skin. 100 g 1    gabapentin (NEURONTIN) 100 MG capsule Take 1 capsule (100 mg) by mouth daily as needed (headache pain) 30 capsule 2    montelukast (SINGULAIR) 10 MG tablet TAKE 1 TABLET(10 MG) BY MOUTH EVERY NIGHT 90 tablet 0    Multiple Vitamins-Minerals (MULTIVITAMIN WITH MINERALS) tablet Take 1 tablet by mouth daily      Spacer/Aero-Holding Chambers  (AEROCHAMBER Z-STAT PLUS/MEDIUM) inhaler Use as instructed 1 each 0    SUMAtriptan (IMITREX) 50 MG tablet Take 1 tablet (50 mg) by mouth as needed      thyroid (Armour Thyroid) 60 MG tablet Take 1 tablet (60 mg) by mouth daily 30 tablet 2    vitamin D (CHOLECALCIFEROL) 25 MCG (1000 UT) tablet Take 1 tablet (1,000 Units) by mouth daily       No current facility-administered medications for this visit.     Allergies   Allergen Reactions    Bee Pollen Swelling     "Bee Stings".Marland KitchenMarland KitchenMarland KitchenStuffy nose; "throat swelling"      Vicodin [Hydrocodone-Acetaminophen] Nausea And Vomiting       Patient Care Team:  Jules Husbands, DO as PCP - General (Family Medicine)  Einar Grad, MD as Consulting Physician (Gastroenterology)  Zerita Boers, MD as Consulting Physician (Endocrinology, Diabetes and Metabolism)  Campbell Lerner, MD as Consulting Physician (Neurology)  Verlene Mayer, MD as Consulting Physician (Neurology)  Angelene Giovanni, MD as Consulting Physician (Vascular Surgery)  Royann Shivers, MD as Consulting Physician (Medical Oncology)    Review of Systems    Objective     BP 162/86 (BP Site: Right arm, Patient Position: Sitting, Cuff Size: Medium)   Pulse (!) 50   Temp 97.6 F (36.4 C)   Resp 12   Ht 1.588 m (5' 2.5")   Wt 59.9 kg (132 lb)   LMP  (LMP Unknown)   BMI 23.76 kg/m   Wt Readings from Last 3 Encounters:   11/14/21 59.9 kg (132 lb)  11/01/21 60.3 kg (133 lb)   08/20/21 59.4 kg (131 lb)     Physical Exam    Diagnostic Tests:    Assessment & Plan     1. Hepatic steatosis  - Hepatitis B (HBV) Surface Antibody Quant  - Hepatitis B (HBV) Core antibody, IgM  - Hepatitis B (HBV) Surface Antigen w/ Reflex to Confirmation  - Hepatitis C (HCV) antibody, Total  - IRON PROFILE  - Transferrin  - Hepatitis A antibody, IGG  - Referral to Gastroenterology (EXTERNAL); Future    2. Elevated LFTs  - Hepatitis B (HBV) Surface Antibody Quant  - Hepatitis B (HBV) Core antibody, IgM  - Hepatitis B (HBV) Surface Antigen w/  Reflex to Confirmation  - Hepatitis C (HCV) antibody, Total  - IRON PROFILE  - Transferrin  - Hepatitis A antibody, IGG  - Referral to Gastroenterology (EXTERNAL); Future    3. Chronic sinusitis, unspecified location  - Referral to ENT - EXTERNAL; Future    Hepatic steatosis  A/P: high-risk for advanced fibrosis  Referred to GI for further evaluation    Patient was provided contact information to schedule with vascular surgery.  Pt was advised if any specialist does not accept her insurance, to contact insurance for participating providers.   You can schedule an appointment with one of our vascular physicians by calling [phone].     The following activities were performed on the date of service:  preparing to see the patient: - chart review   - review of prior labs  - review of prior imaging tests  - review of consultant reports  obtaining and/or reviewing the separately obtained history  counseling and educating the patient/family/caregiver  ordering medications, tests, or procedures  documenting clinical information in the electronic or other health records    Total time spent performing activities on date of service:  35  minutes            Return in about 3 months (around 02/14/2022) for chronic disease management, Return sooner as needed.

## 2021-11-14 NOTE — Progress Notes (Signed)
Have you seen any specialists/other providers since your last visit with [place]?    No     The patient is due for:   Health Maintenance Due   Topic Date Due    Statin Use  Never done    Advance Directive on File  Never done    Tetanus Ten-Year  01/06/2021    Annual Exam  09/18/2021

## 2021-11-14 NOTE — Assessment & Plan Note (Addendum)
A/P: high-risk for advanced fibrosis  Referred to GI for further evaluation

## 2021-11-15 LAB — HEPATITIS A AB, IGG: Hepatitis A Ab, IGG: NONREACTIVE

## 2021-11-15 LAB — HEPATITIS B SURFACE ANTIBODY: HEPATITIS B SURFACE ANTIBODY: <3.31 m[IU]/mL

## 2021-11-15 LAB — HEPATITIS C ANTIBODY: Hepatitis C, AB: NONREACTIVE

## 2021-11-15 LAB — HEPATITIS B SURFACE ANTIGEN W/ REFLEX TO CONFIRMATION: Hepatitis B Surface Antigen: NONREACTIVE

## 2021-11-15 LAB — HEPATITIS B CORE ANTIBODY, IGM: Hepatitis B Core IgM: NONREACTIVE

## 2021-11-18 ENCOUNTER — Other Ambulatory Visit (INDEPENDENT_AMBULATORY_CARE_PROVIDER_SITE_OTHER): Payer: Self-pay | Admitting: Family Medicine

## 2021-11-18 DIAGNOSIS — Z9884 Bariatric surgery status: Secondary | ICD-10-CM

## 2021-11-18 DIAGNOSIS — E611 Iron deficiency: Secondary | ICD-10-CM

## 2021-11-18 DIAGNOSIS — K760 Fatty (change of) liver, not elsewhere classified: Secondary | ICD-10-CM

## 2021-11-18 DIAGNOSIS — Z23 Encounter for immunization: Secondary | ICD-10-CM

## 2021-11-18 DIAGNOSIS — D649 Anemia, unspecified: Secondary | ICD-10-CM

## 2021-11-18 MED ORDER — FERROUS GLUCONATE 324 (38 FE) MG PO TABS
324.0000 mg | ORAL_TABLET | ORAL | 0 refills | Status: AC
Start: 2021-11-18 — End: ?

## 2021-11-18 MED ORDER — VITAMIN C 250 MG PO TABS
250.0000 mg | ORAL_TABLET | ORAL | 0 refills | Status: AC
Start: 2021-11-18 — End: ?

## 2021-12-03 ENCOUNTER — Other Ambulatory Visit (INDEPENDENT_AMBULATORY_CARE_PROVIDER_SITE_OTHER): Payer: Self-pay | Admitting: Nurse Practitioner

## 2021-12-03 DIAGNOSIS — E890 Postprocedural hypothyroidism: Secondary | ICD-10-CM

## 2021-12-03 MED ORDER — ARMOUR THYROID 60 MG PO TABS
60.0000 mg | ORAL_TABLET | Freq: Every day | ORAL | 3 refills | Status: DC
Start: 2021-12-03 — End: 2022-04-14

## 2021-12-03 NOTE — Telephone Encounter (Signed)
Last Visit 11/01/2021    ICD: E89.0    Due for f/u 05/03/2022    F/u appt None    Last lab 11/05/2021

## 2021-12-03 NOTE — Telephone Encounter (Signed)
The pt is calling regarding refilling Armour Thyroid 60mg . The pt is out of medication and will need the refill submitted urgently. Please contact the pt with any questions or concerns.

## 2022-01-07 ENCOUNTER — Telehealth (INDEPENDENT_AMBULATORY_CARE_PROVIDER_SITE_OTHER): Payer: 59 | Admitting: Family Medicine

## 2022-01-07 ENCOUNTER — Encounter (INDEPENDENT_AMBULATORY_CARE_PROVIDER_SITE_OTHER): Payer: Self-pay | Admitting: Family Medicine

## 2022-01-07 VITALS — Ht 62.5 in | Wt 117.0 lb

## 2022-01-07 DIAGNOSIS — K529 Noninfective gastroenteritis and colitis, unspecified: Secondary | ICD-10-CM

## 2022-01-07 DIAGNOSIS — R5383 Other fatigue: Secondary | ICD-10-CM

## 2022-01-07 DIAGNOSIS — Z1231 Encounter for screening mammogram for malignant neoplasm of breast: Secondary | ICD-10-CM

## 2022-01-07 DIAGNOSIS — D509 Iron deficiency anemia, unspecified: Secondary | ICD-10-CM

## 2022-01-07 DIAGNOSIS — R634 Abnormal weight loss: Secondary | ICD-10-CM

## 2022-01-07 NOTE — Progress Notes (Signed)
Have you seen any specialists/other providers since your last visit with [place]?    Yes, ED     The patient is due for:   Health Maintenance Due   Topic Date Due    Statin Use  Never done    Advance Directive on File  Never done    Tetanus Ten-Year  01/06/2021    Annual Exam  09/18/2021

## 2022-01-07 NOTE — Progress Notes (Signed)
Subjective:    Date: 01/07/2022 12:11 PM   Patient ID: Tracy [name] is a 57 y.o. female.    This visit is being conducted via video and audio.    Verbal consent has been obtained from the patient to conduct a video and audio visit.    Patient confirms she is in the state of [place] at the time of this visit    Chief Complaint   Patient presents with    Labs Only     Pt states she would like to discuss lab work to be completed, F/U from Iron infusion       HPI  Pt with history of gastic sleeve 2015 and gastric bypass 2018  She presents to discuss iron deficiency and requesting blood transfusion.   Pt reports she is losing weight - about 15 lbs in the past month.  States she eats well.   She denies bright red blood per rectum or stool, but reports has seen black stools, but not consistently.    She saw heme/onc Dr. Onnie Graham on 11/05/2021 and subsequent to that appointment, patient reports that she had an iron infusion in December (x1).  She reports chronic fatigue. Also reports chronic diarrhea x years.   She states that the hematologist recommended that she see GI for evaluation.  Pt reports she was not given a referral and has not made an appointment with GI yet.   She does have a f/u with Dr. Gasper Lloyd tomorrow.     Lab Results   Component Value Date    TSH 2.55 11/05/2021    TSH 0.80 12/19/2020    TSH 6.880 (H) 09/18/2020    TSH 7.19 (H) 08/03/2020    TSH 4.79 07/05/2019    TSH 4.56 04/07/2019       No problems updated.    Patient Active Problem List   Diagnosis    Migraine    History of hypertension    Abdominal cyst    Hiatal hernia    Hypokalemia    S/P gastric bypass    Hepatic steatosis    Postsurgical hypothyroidism    Tubular adenoma of colon    Allergic rhinitis    Sleep difficulties    Difficulty concentrating    Forgetfulness    Hx of migraine headaches    Occipital headache    Frontal headache    Hx of concussion    White matter abnormality on MRI of brain    Hx of sleep apnea    Irritable  bowel syndrome with diarrhea    Chronic diarrhea    Preventative health care    Normocytic anemia     Patient Care Team:  Jules Husbands, DO as PCP - General (Family Medicine)  Einar Grad, MD as Consulting Physician (Gastroenterology)  Zerita Boers, MD as Consulting Physician (Endocrinology, Diabetes and Metabolism)  Campbell Lerner, MD as Consulting Physician (Neurology)  Frankey Shown Gerome Apley, MD as Consulting Physician (Neurology)  Angelene Giovanni, MD as Consulting Physician (Vascular Surgery)  Royann Shivers, MD as Consulting Physician (Medical Oncology)    Immunization History   Administered Date(s) Administered    COVID-19 mRNA BIVALENT vaccine 12 years and above [employer]) 30 mcg/0.3 mL 11/21/2020    COVID-19 mRNA MONOVALENT vaccine PRIMARY SERIES 12 years and above (Pfizer) 30 mcg/0.3 mL (DILUTE BEFORE USE) 04/16/2019, 05/14/2019    Pneumococcal 23 valent 07/14/2019    Tdap 01/07/2011    Zoster (SHINGRIX) Vaccine Recombinant 07/14/2019     Current  Outpatient Medications   Medication Sig Dispense Refill    albuterol sulfate HFA (PROVENTIL) 108 (90 Base) MCG/ACT inhaler Inhale 2 puffs into the lungs every 4 (four) hours as needed for Wheezing or Shortness of Breath (cough) 1 Inhaler 0    Armour Thyroid 15 MG tablet Take 1 tablet (15 mg) by mouth daily 30 tablet 2    Armour Thyroid 60 MG tablet Take 1 tablet (60 mg) by mouth daily 30 tablet 3    Ascorbic Acid (vitamin C) 250 MG tablet Take 1 tablet (250 mg) by mouth every other day 100 tablet 0    Beclomethasone Dipropionate (Qnasl) 80 MCG/ACT Aero Soln 2 sprays by Nasal route daily 3 Inhaler 1    Biotin 5 MG/ML Liquid Take by mouth daily         butalbital-acetaminophen-caffeine (FIORICET) 50-325-40 MG per tablet Take 1 tablet by mouth every 4 (four) hours as needed for Headaches 15 tablet 0    calcium carbonate 1500 (600 Ca) MG Tab tablet Take 1 tablet (600 mg) by mouth      cetirizine (ZyrTEC Allergy) 10 MG tablet Take 1 tablet (10 mg total) by mouth  daily 90 tablet 0    COPPER PO Take by mouth      diclofenac Sodium (VOLTAREN) 1 % Gel topical gel Apply 4 g to affected area 4 times daily as needed.  Max: 16 g/joint/day.  Do not apply to broken, inflamed, or infected skin. 100 g 1    ferrous gluconate (FERGON) 324 MG tablet Take 1 tablet (324 mg) by mouth every other day 100 tablet 0    montelukast (SINGULAIR) 10 MG tablet TAKE 1 TABLET(10 MG) BY MOUTH EVERY NIGHT 90 tablet 0    Multiple Vitamins-Minerals (MULTIVITAMIN WITH MINERALS) tablet Take 1 tablet by mouth daily      Multiple Vitamins-Minerals (ZINC PO) Take by mouth      Spacer/Aero-Holding Chambers (AEROCHAMBER Z-STAT PLUS/MEDIUM) inhaler Use as instructed 1 each 0    SUMAtriptan (IMITREX) 50 MG tablet Take 1 tablet (50 mg) by mouth as needed      thyroid (Armour Thyroid) 60 MG tablet Take 1 tablet (60 mg) by mouth daily 30 tablet 2    VITAMIN A PO Take by mouth      vitamin D (CHOLECALCIFEROL) 25 MCG (1000 UT) tablet Take 1 tablet (1,000 Units) by mouth daily      gabapentin (NEURONTIN) 100 MG capsule Take 1 capsule (100 mg) by mouth daily as needed (headache pain) 30 capsule 2     No current facility-administered medications for this visit.     Allergies   Allergen Reactions    Bee Pollen Swelling     "Bee Stings".Marland KitchenMarland KitchenMarland KitchenStuffy nose; "throat swelling"      Vicodin [Hydrocodone-Acetaminophen] Nausea And Vomiting       Past Medical History:   Diagnosis Date    Anemia     Arthritis     Carotid stenosis     Diarrhea 12/2016    Since I had my dual switch surgery    Fatty liver 2017    GERD (gastroesophageal reflux disease)     Hepatic cirrhosis     Hiatal hernia     Hyperthyroidism 2013    Thyroid removed    Sleep apnea     not using CPAP machine    Vitamin D deficiency      Past Surgical History:   Procedure Laterality Date    CHOLECYSTECTOMY  12/2016    COLONOSCOPY,  BIOPSY N/A 01/27/2018    Procedure: COLONOSCOPY, BIOPSY;  Surgeon: Shanon Brow, MD;  Location: [facility];  Service:  Gastroenterology;  Laterality: N/A;    DUODENAL SWITCH  12/2016    EGD, BIOPSY N/A 01/27/2018    Procedure: EGD, BIOPSY;  Surgeon: Shanon Brow, MD;  Location: [facility];  Service: Gastroenterology;  Laterality: N/A;    GASTRECTOMY  09/2013    HYSTERECTOMY  2015    Partial.    LAPAROSCOPIC REVISION SLEEVE GASTRECTOMY TO GASTRIC BYPASS      LAPAROSCOPIC, GASTRECTOMY SLEEVE      SALPINGO OOPHERECTOMY Bilateral     THYROIDECTOMY  2013       Family History   Problem Relation Age of Onset    Cancer Mother         hx ovarian    Hypertension Mother     Inflammatory bowel disease Mother     Heart failure Mother     Angina Mother     Migraines Mother     Asthma Father     Diabetes Father     Hyperlipidemia Father     Hypertension Father     Cancer Father         Currently has cancer    Crohn's disease Father     Asthma Sister     Diabetes Sister     Hyperlipidemia Sister     Hypertension Sister     Early death Sister     Cancer Brother         hx of stomach    Hyperlipidemia Brother     Hypertension Brother     Migraines Brother     Learning disabilities Brother         Deaf    Breast cancer Paternal Aunt         died of     Breast cancer Other         maternal cousin-- survior    Obesity Son        Social History     Socioeconomic History    Marital status: Significant Other   Tobacco Use    Smoking status: Never    Smokeless tobacco: Never    Tobacco comments:     None   Vaping Use    Vaping Use: Never used   Substance and Sexual Activity    Alcohol use: No    Drug use: Not Currently    Sexual activity: Not Currently     Partners: Male     Birth control/protection: Post-menopausal, Rhythm   Other Topics Concern    Dietary supplements / vitamins Yes     Comment: Biotin B12 multi vitamins D3    Anesthesia problems No    Blood thinners No    Pregnant No    Future Children No    Number of Pregnancies? No    Number of children Yes     Comment: 1    Miscarriages / Abortions? No    Eats large amounts No    Excessive  Sweets No    Skips meals No    Eats excessive starches No    Snacks or grazes No    Emotional eater No    Eats fried food No    Eats fast food No    Diet Center Yes    Doylene Bode No    LA Weight Loss No    Nutri-System No    Opti-Fast / Medi-Fast Yes  Overeaters Anonymous No    [facility] Yes    TOPS No    Weight Watchers Yes    Atkins Yes    Binging / Purging No    Calorie Counting Yes    Fasting Yes    High Protein Yes    Low Carb Yes    Low Fat No    [facility] Diet No    Slim Fast Yes    [place] No    Stationary cycle or treadmill Yes    Gym/fitness Classes Yes    Home exercise/video Yes    Swimming Yes    Weight training Yes    Walking or running Yes    Hospitalization No    Hypnosis No    Physical therapy Yes    Psychological therapy Yes    Residential program No    Acutrim No    [place] No    Contrave No    Dexatrim Yes    Diethylpropion No    Fastin No    Fen - Phen Yes    Ionamin / Adipex No    Phentermine Yes    Qsymia No    Prozac Yes    Saxenda No    Topamax No    Wellbutrin No    Xenical (Orlistat, Alli) No    Other Med No    No impairment Yes    Walks with cane/crutch Yes    Requires a wheelchair No    Bedridden No    Are you currently being treated for depression? No    Do you snore? No    Are you receiving any medical or psychological services? No    Do you ever wake up at night gasping for breath? No    Do you have or have you been treated for an eating disorder? No    Anyone ever told you that you stop breathing while asleep? No    Do you exercise regularly? Yes    Have you or family member ever have trouble with anesthesia? Yes     Social Determinants of Health     Financial Resource Strain: Medium Risk (06/25/2021)    Overall Financial Resource Strain (CARDIA)     Difficulty of Paying Living Expenses: Somewhat hard   Food Insecurity: Food Insecurity Present (06/25/2021)    Hunger Vital Sign     Worried About Running Out of Food in the Last Year: Never true     Ran Out of  Food in the Last Year: Sometimes true   Transportation Needs: No Transportation Needs (06/25/2021)    PRAPARE - Therapist, art (Medical): No     Lack of Transportation (Non-Medical): No   Physical Activity: Sufficiently Active (06/25/2021)    Exercise Vital Sign     Days of Exercise per Week: 6 days     Minutes of Exercise per Session: 60 min   Stress: No Stress Concern Present (06/25/2021)    [employer] of Occupational Health - Occupational Stress Questionnaire     Feeling of Stress : Only a little   Social Connections: Moderately Integrated (05/17/2020)    Social Connection and Isolation Panel [NHANES]     Frequency of Communication with Friends and Family: More than three times a week     Frequency of Social Gatherings with Friends and Family: Once a week     Attends Religious Services: More than 4 times per year  Active Member of Clubs or Organizations: Yes     Attends Archivist Meetings: More than 4 times per year     Marital Status: Never married   Intimate Partner Violence: Not At Risk (05/17/2020)    Humiliation, Afraid, Rape, and Kick questionnaire     Fear of Current or Ex-Partner: No     Emotionally Abused: No     Physically Abused: No     Sexually Abused: No   Housing Stability: High Risk (06/25/2021)    Housing Stability Vital Sign     Unable to Pay for Housing in the Last Year: Yes     Number of [place] in the Last Year: 1     Unstable Housing in the Last Year: No     The following portions of the patient's history were reviewed and updated as appropriate: allergies, current medications, past family history, past medical history, past social history, past surgical history and problem list.    Review of Systems     Objective:   Ht 1.588 m (5' 2.5")   Wt 53.1 kg (117 lb)   LMP  (LMP Unknown)   BMI 21.06 kg/m   Wt Readings from Last 3 Encounters:   01/07/22 53.1 kg (117 lb)   11/14/21 59.9 kg (132 lb)   11/01/21 60.3 kg (133 lb)     Physical Exam        Assessment/Plan:       1. Iron deficiency anemia, unspecified iron deficiency anemia type  Had recent cbc in ED shows hgb/hct 10.5/33.5 mcv 83  Follow-up with heme/onc - has appointment tomorrow  May need further iron infusions  - Referral to Gastroenterology; Future    2. Other fatigue  Likely multifactorial  Continue treatment for iron deficiency    3. Chronic diarrhea  Consult GI  - Referral to Gastroenterology; Future    4. Weight loss  Consult GI and heme/onc  Colonoscopy 01/2018 - advised to repeat in 5 years, however given iron deficiency and patient reported black stools, likely need repeat sooner  Due for mammogram  Hx of partial hysterectomy - last pap reported 2020 - ?if cervix remains, would recommend pelvic exam and potential pap  - Referral to Gastroenterology; Future    5. Encounter for screening mammogram for malignant neoplasm of breast  -     Mammo Screening 3D/Tomo Bilateral; Future      Return for Pap/gyn exam, chronic disease management - schedule at earliest convenience.    Marline Backbone, DO

## 2022-01-08 ENCOUNTER — Other Ambulatory Visit (INDEPENDENT_AMBULATORY_CARE_PROVIDER_SITE_OTHER): Payer: Self-pay | Admitting: Nurse Practitioner

## 2022-01-08 ENCOUNTER — Other Ambulatory Visit (INDEPENDENT_AMBULATORY_CARE_PROVIDER_SITE_OTHER): Payer: Self-pay | Admitting: "Endocrinology

## 2022-01-08 DIAGNOSIS — E890 Postprocedural hypothyroidism: Secondary | ICD-10-CM

## 2022-01-17 ENCOUNTER — Encounter (INDEPENDENT_AMBULATORY_CARE_PROVIDER_SITE_OTHER): Payer: Self-pay | Admitting: Family Medicine

## 2022-02-12 ENCOUNTER — Telehealth: Payer: Self-pay | Admitting: "Endocrinology

## 2022-02-12 NOTE — Telephone Encounter (Signed)
Pt called to let the Dr know her pharmacist has faxed over a document for her insurance explaining why the pt needs Armour Thyroid 15 MG tablet & Armour Thyroid 60  MG tablet   . It is called a PDL (Prefer Drug List)   She needs that letter send back to her insurance so they can allow the refills for the meds above  She has been out of medication for two days. She wants to know if the office can please expedite this for her   Can the office please assist the patient

## 2022-02-13 NOTE — Telephone Encounter (Signed)
Is it that they need a PA for the Armour, or clarification on why she needs both 15 and 60mg  Armour?  Patient was previously on Synthroid and did not feel well on it.  I switched her to [place] in 2020 and she has been doing well with that.  If there is an alternative to Armour that is covered, such as NP thyroid, that would work as well.  Let me know which one it is.   Thanks!

## 2022-02-14 ENCOUNTER — Encounter (INDEPENDENT_AMBULATORY_CARE_PROVIDER_SITE_OTHER): Payer: Self-pay | Admitting: "Endocrinology

## 2022-02-25 ENCOUNTER — Other Ambulatory Visit: Payer: Self-pay | Admitting: Family Medicine

## 2022-02-25 ENCOUNTER — Other Ambulatory Visit (INDEPENDENT_AMBULATORY_CARE_PROVIDER_SITE_OTHER): Payer: Self-pay | Admitting: Family Medicine

## 2022-02-25 DIAGNOSIS — J309 Allergic rhinitis, unspecified: Secondary | ICD-10-CM

## 2022-02-25 DIAGNOSIS — J301 Allergic rhinitis due to pollen: Secondary | ICD-10-CM

## 2022-02-25 NOTE — Telephone Encounter (Signed)
Name, strength, directions of requested refill(s):  Beclomethasone Dipropionate (Qnasl) 80 MCG/ACT Aero Soln     How much medication is remaining: 0    Pharmacy to send refill to or patient to pick up rx from office (mark requested pharmacy in BOLD):    [place], [place] - [address]  [place]  [place] [address]  Phone: [phone] Fax: [phone]    Please mark "X" next to the preferred call back number:    Mobile: [phone] (mobile) x   Home: [phone] (home)    Work: @WORKPHONE$ @      Medication refill request, see above. Thank you   Patient has been informed that medication refill requests should be called in up to one week prior to running out of medication.    Additional Notes:    Last visit was video 01/07/2022  Last office visit 11/14/2021

## 2022-02-26 MED ORDER — QNASL 80 MCG/ACT NA AERS
2.0000 | INHALATION_SPRAY | Freq: Every day | NASAL | 1 refills | Status: DC
Start: 2022-02-26 — End: 2022-03-03

## 2022-03-02 ENCOUNTER — Encounter (INDEPENDENT_AMBULATORY_CARE_PROVIDER_SITE_OTHER): Payer: Self-pay | Admitting: Family Medicine

## 2022-03-03 ENCOUNTER — Telehealth: Payer: Self-pay | Admitting: Family Medicine

## 2022-03-03 ENCOUNTER — Other Ambulatory Visit (INDEPENDENT_AMBULATORY_CARE_PROVIDER_SITE_OTHER): Payer: Self-pay | Admitting: Family Medicine

## 2022-03-03 ENCOUNTER — Telehealth (INDEPENDENT_AMBULATORY_CARE_PROVIDER_SITE_OTHER): Payer: 59 | Admitting: Family

## 2022-03-03 DIAGNOSIS — J301 Allergic rhinitis due to pollen: Secondary | ICD-10-CM

## 2022-03-03 DIAGNOSIS — J309 Allergic rhinitis, unspecified: Secondary | ICD-10-CM

## 2022-03-03 DIAGNOSIS — B9689 Other specified bacterial agents as the cause of diseases classified elsewhere: Secondary | ICD-10-CM

## 2022-03-03 DIAGNOSIS — J329 Chronic sinusitis, unspecified: Secondary | ICD-10-CM

## 2022-03-03 MED ORDER — CETIRIZINE HCL 10 MG PO TABS
10.0000 mg | ORAL_TABLET | Freq: Every day | ORAL | 1 refills | Status: AC
Start: 2022-03-03 — End: ?

## 2022-03-03 MED ORDER — QNASL 80 MCG/ACT NA AERS
2.0000 | INHALATION_SPRAY | Freq: Every day | NASAL | 1 refills | Status: DC
Start: 2022-03-03 — End: 2023-04-07

## 2022-03-03 MED ORDER — MONTELUKAST SODIUM 10 MG PO TABS
10.0000 mg | ORAL_TABLET | Freq: Every evening | ORAL | 0 refills | Status: AC
Start: 2022-03-03 — End: 2023-04-09

## 2022-03-03 MED ORDER — QNASL 80 MCG/ACT NA AERS
2.0000 | INHALATION_SPRAY | Freq: Every day | NASAL | 0 refills | Status: DC
Start: 2022-03-03 — End: 2022-12-03

## 2022-03-03 MED ORDER — BENZONATATE 100 MG PO CAPS
100.0000 mg | ORAL_CAPSULE | Freq: Three times a day (TID) | ORAL | 0 refills | Status: AC | PRN
Start: 2022-03-03 — End: 2022-03-08

## 2022-03-03 MED ORDER — ALBUTEROL SULFATE HFA 108 (90 BASE) MCG/ACT IN AERS
2.0000 | INHALATION_SPRAY | RESPIRATORY_TRACT | 0 refills | Status: DC | PRN
Start: 2022-03-03 — End: 2023-04-07

## 2022-03-03 MED ORDER — MONTELUKAST SODIUM 10 MG PO TABS
ORAL_TABLET | ORAL | 1 refills | Status: AC
Start: 2022-03-03 — End: ?

## 2022-03-03 MED ORDER — DOXYCYCLINE HYCLATE 100 MG PO TABS
100.0000 mg | ORAL_TABLET | Freq: Two times a day (BID) | ORAL | 0 refills | Status: AC
Start: 2022-03-03 — End: 2022-03-10

## 2022-03-03 NOTE — Telephone Encounter (Signed)
Patient is calling for prior authorization for medication per insurance company. Patient is requesting a call about this issue. This has been going on since 02/25/2022. Please contact patient @ [phone]

## 2022-03-03 NOTE — Telephone Encounter (Signed)
Called and spoke to pt. Pt states authorization is needed for the Qnasl. Advised pt Dr. Exie Parody is not the prescriber of the medication and will need to contact where she was seen for an authorization. Pt voiced understanding. Advised pt that Singulair and Zyrtec were sent to pharmacy today 03/03/22. Pt verbalized understanding.

## 2022-03-03 NOTE — Progress Notes (Signed)
[facility] VISIT NOTE     Patient: Tracy [name]   Date: 03/03/2022   MRN: [id number]     This visit is being conducted via video and telephone.   Verbal consent has been obtained from the patient to conduct a video and telephone visit to minimize exposure to COVID-19: yes    Patient confirms that currently in [place] at the time of this visit: yes    Tracy [name] is a 57 y.o. female     Subjective     HPI [name] 57 year old female with a history of hyperthyroidism, fatty liver presents virtually for 1 week of worsening of sinus pain and pressure, nasal congestion, mild dry cough that has been associated with chest congestion at times.  Patient states that she needs a refill of albuterol, qnasl nasal spray, and montelukast.  Denies fever/chills, shortness of breath, chest pain, nausea/vomiting, abdominal pain.    History:    Pertinent Past Medical, Surgical, Family and Social History were reviewed.        Current Outpatient Medications:     albuterol sulfate HFA (Proventil HFA) 108 (90 Base) MCG/ACT inhaler, Inhale 2 puffs into the lungs every 4 (four) hours as needed for Wheezing or Shortness of Breath, Disp: 18 g, Rfl: 0    albuterol sulfate HFA (PROVENTIL) 108 (90 Base) MCG/ACT inhaler, Inhale 2 puffs into the lungs every 4 (four) hours as needed for Wheezing or Shortness of Breath (cough), Disp: 1 Inhaler, Rfl: 0    Armour Thyroid 15 MG tablet, Take 1 tablet by mouth once daily, Disp: 30 tablet, Rfl: 5    Armour Thyroid 60 MG tablet, Take 1 tablet (60 mg) by mouth daily, Disp: 30 tablet, Rfl: 3    Ascorbic Acid (vitamin C) 250 MG tablet, Take 1 tablet (250 mg) by mouth every other day, Disp: 100 tablet, Rfl: 0    Beclomethasone Dipropionate (Qnasl) 80 MCG/ACT Aero Soln, 2 sprays by Nasal route daily, Disp: 1 each, Rfl: 0    Beclomethasone Dipropionate (Qnasl) 80 MCG/ACT Aero Soln, 2 sprays by Nasal route daily, Disp: 3 each, Rfl: 1    benzonatate (TESSALON) 100 MG capsule,  Take 1 capsule (100 mg) by mouth 3 (three) times daily as needed for Cough, Disp: 15 capsule, Rfl: 0    Biotin 5 MG/ML Liquid, Take by mouth daily  , Disp: , Rfl:     butalbital-acetaminophen-caffeine (FIORICET) 50-325-40 MG per tablet, Take 1 tablet by mouth every 4 (four) hours as needed for Headaches, Disp: 15 tablet, Rfl: 0    calcium carbonate 1500 (600 Ca) MG Tab tablet, Take 1 tablet (600 mg) by mouth, Disp: , Rfl:     cetirizine (ZyrTEC Allergy) 10 MG tablet, Take 1 tablet (10 mg) by mouth daily, Disp: 90 tablet, Rfl: 1    COPPER PO, Take by mouth, Disp: , Rfl:     diclofenac Sodium (VOLTAREN) 1 % Gel topical gel, Apply 4 g to affected area 4 times daily as needed.  Max: 16 g/joint/day.  Do not apply to broken, inflamed, or infected skin., Disp: 100 g, Rfl: 1    doxycycline (VIBRA-TABS) 100 MG tablet, Take 1 tablet (100 mg) by mouth every 12 (twelve) hours for 7 days, Disp: 14 tablet, Rfl: 0    ferrous gluconate (FERGON) 324 MG tablet, Take 1 tablet (324 mg) by mouth every other day, Disp: 100 tablet, Rfl: 0    gabapentin (NEURONTIN) 100 MG capsule, Take 1  capsule (100 mg) by mouth daily as needed (headache pain), Disp: 30 capsule, Rfl: 2    montelukast (SINGULAIR) 10 MG tablet, TAKE 1 TABLET(10 MG) BY MOUTH EVERY NIGHT, Disp: 90 tablet, Rfl: 1    montelukast (SINGULAIR) 10 MG tablet, Take 1 tablet (10 mg) by mouth nightly, Disp: 30 tablet, Rfl: 0    Multiple Vitamins-Minerals (MULTIVITAMIN WITH MINERALS) tablet, Take 1 tablet by mouth daily, Disp: , Rfl:     Multiple Vitamins-Minerals (ZINC PO), Take by mouth, Disp: , Rfl:     Spacer/Aero-Holding Chambers (AEROCHAMBER Z-STAT PLUS/MEDIUM) inhaler, Use as instructed, Disp: 1 each, Rfl: 0    SUMAtriptan (IMITREX) 50 MG tablet, Take 1 tablet (50 mg) by mouth as needed, Disp: , Rfl:     thyroid (Armour Thyroid) 60 MG tablet, Take 1 tablet (60 mg) by mouth daily, Disp: 30 tablet, Rfl: 2    VITAMIN A PO, Take by mouth, Disp: , Rfl:     vitamin D (CHOLECALCIFEROL)  25 MCG (1000 UT) tablet, Take 1 tablet (1,000 Units) by mouth daily, Disp: , Rfl:     Allergies   Allergen Reactions    Bee Pollen Swelling     "Bee Stings".Marland KitchenMarland KitchenMarland KitchenStuffy nose; "throat swelling"      Vicodin [Hydrocodone-Acetaminophen] Nausea And Vomiting       Medications and Allergies reviewed.    Objective     Physical Exam  Constitutional:       Appearance: Normal appearance.   HENT:      Nose: Congestion present.   Pulmonary:      Effort: Pulmonary effort is normal.   Neurological:      Mental Status: She is alert.       Constitutional: Appears well in no acute distress  Neurological: Alert and oriented.  Psychiatric: Mood and Affect normal. Behavior normal.     LABS/IMAGING:    There were no labs reviewed with this patient during the visit.    There were no x-rays reviewed with this patient during the visit.         MDM: Shared decision making with patient to treat for bacterial sinusitis.  Prescribe doxycycline based on patient's PMH.  Also prescribed Tessalon Perle, albuterol inhaler, Qnasl nasal spray and montelukast.  Discussed red flags, ER precautions, over-the-counter symptomatic relief, follow-up with PCP if needed.  All questions answered.  Patient states understanding with info given and agrees with the plan of care.    Diagnoses and all orders for this visit:    Bacterial sinusitis    Allergic rhinitis, unspecified seasonality, unspecified trigger  -     Beclomethasone Dipropionate (Qnasl) 80 MCG/ACT Aero Soln; 2 sprays by Nasal route daily    Other orders  -     doxycycline (VIBRA-TABS) 100 MG tablet; Take 1 tablet (100 mg) by mouth every 12 (twelve) hours for 7 days  -     benzonatate (TESSALON) 100 MG capsule; Take 1 capsule (100 mg) by mouth 3 (three) times daily as needed for Cough  -     albuterol sulfate HFA (Proventil HFA) 108 (90 Base) MCG/ACT inhaler; Inhale 2 puffs into the lungs every 4 (four) hours as needed for Wheezing or Shortness of Breath  -     montelukast (SINGULAIR) 10 MG tablet;  Take 1 tablet (10 mg) by mouth nightly  -     Beclomethasone Dipropionate (Qnasl) 80 MCG/ACT Aero Soln; 2 sprays by Nasal route daily          Discussed diagnosis  and treatment with patient.  Reviewed warning signs for worsening condition as well as indications for follow-up with pmd, return to the urgent care center or emergency department.  Patient expressed understanding of instructions.      An After Visit Summary was transmitted to the patient via [place].

## 2022-03-03 NOTE — Patient Instructions (Signed)
Please follow-up with your primary care provider if your symptoms do not improve in the next 2 to 3 days.  Please seek emergency care if the symptoms are worsening.

## 2022-04-14 ENCOUNTER — Other Ambulatory Visit (INDEPENDENT_AMBULATORY_CARE_PROVIDER_SITE_OTHER): Payer: Self-pay | Admitting: "Endocrinology

## 2022-05-05 ENCOUNTER — Other Ambulatory Visit (INDEPENDENT_AMBULATORY_CARE_PROVIDER_SITE_OTHER): Payer: Self-pay | Admitting: "Endocrinology

## 2022-06-13 ENCOUNTER — Other Ambulatory Visit (INDEPENDENT_AMBULATORY_CARE_PROVIDER_SITE_OTHER): Payer: Self-pay | Admitting: "Endocrinology

## 2022-06-17 ENCOUNTER — Ambulatory Visit: Payer: Medicaid Other | Attending: Family Nurse Practitioner | Admitting: Family Nurse Practitioner

## 2022-06-17 ENCOUNTER — Encounter: Payer: Self-pay | Admitting: Family Nurse Practitioner

## 2022-06-17 VITALS — BP 157/88 | HR 48 | Resp 16 | Ht 62.0 in | Wt 130.0 lb

## 2022-06-17 DIAGNOSIS — R419 Unspecified symptoms and signs involving cognitive functions and awareness: Secondary | ICD-10-CM

## 2022-06-17 DIAGNOSIS — R9082 White matter disease, unspecified: Secondary | ICD-10-CM | POA: Insufficient documentation

## 2022-06-17 DIAGNOSIS — I779 Disorder of arteries and arterioles, unspecified: Secondary | ICD-10-CM | POA: Insufficient documentation

## 2022-06-17 DIAGNOSIS — G4452 New daily persistent headache (NDPH): Secondary | ICD-10-CM | POA: Insufficient documentation

## 2022-06-17 DIAGNOSIS — Z818 Family history of other mental and behavioral disorders: Secondary | ICD-10-CM | POA: Insufficient documentation

## 2022-06-17 DIAGNOSIS — R519 Headache, unspecified: Secondary | ICD-10-CM

## 2022-06-17 DIAGNOSIS — M5481 Occipital neuralgia: Secondary | ICD-10-CM | POA: Insufficient documentation

## 2022-06-17 DIAGNOSIS — G43009 Migraine without aura, not intractable, without status migrainosus: Secondary | ICD-10-CM | POA: Insufficient documentation

## 2022-06-17 MED ORDER — NAPROXEN 500 MG PO TABS
ORAL_TABLET | ORAL | 2 refills | Status: DC
Start: 2022-06-17 — End: 2022-06-17

## 2022-06-17 MED ORDER — DULOXETINE HCL 20 MG PO CPEP
20.0000 mg | ORAL_CAPSULE | Freq: Every day | ORAL | 2 refills | Status: AC
Start: 2022-06-17 — End: ?

## 2022-06-17 NOTE — Patient Instructions (Signed)
Our plan:     1. NDPH (new daily persistent headache)  2. Migraine without aura and without status migrainosus, not intractable  3. Occipital neuralgia of right side  4. Worsening headaches  - Begin DULoxetine (CYMBALTA) 20 MG capsule; Take 1 capsule (20 mg) by mouth daily  Dispense: 30 capsule; Refill: 2  - Occipital Nerve Block; Future  - MRI Brain WO Contrast; Future  - Avoid acute medication overuse headache and limiting acute medication (ie: triptans, NSAIDs, Tyelol) use to no more than 3 days per week.   - Follow the SEEDS for success in headache management, including Sleep hygiene, Exercising regularly, Eating healthy and regular meals, Drinking water, keeping a headache Diary, and Stress reduction.    5. Cognitive complaints  6. Family history of dementia  - Referral to Neurology ([place]); Future Dr. Abran Cantor   - I have request various blood studies including thyroid panel and B12 level.  - Vitamin D, 25 OH, Total; Future  - TSH; Future  - Homocysteine; Future  - Methylmalonic Acid; Future  - Vitamin B12; Future  - Emphasized the importance of pursuing the triad of regular physical exercise, "brain exercise" and socialization along with maintaining a "Mediterranean diet".     7. White matter abnormality on MRI of brain  - Stable across serial scans    8. Carotid artery disorder  - Stable across serial scans: carotid irregularity (web vs. Chronic dissection) is incidental.    - Continue aspirin  - She will set up follow up with vascular surgery  - Referral Vascular Surgery ([place]); Future          Today's Visit:      In today's visit, I reviewed your medications and records relating your health - prior testing, blood work, reports of other health care providers present in your electronic medical record.     If you have pertinent records from any non-[place] doctors that you would like to review, please have them sent to [place] or bring to them to your next office visit.     A copy of today's visit will be sent to  your referring doctor and/or primary care doctor, if you have one listed in our system.    Let me know if there are things we could have done better for your office visit.    Patient satisfaction survey:      If you receive a patient satisfaction survey, I would greatly appreciate it if you would complete it. We value your feedback.     Contact me online:      Patient Portal online - Please sign up for MyChart -- this is the best way to communicate with your team here.  There is a messaging feature, where you can [place] messages anytime of day.  It is the best way to communicate with [place] and get test results, medication refills, or ask questions.    If you are having a medical emergency -- call 911, DO NOT SEND A MESSAGE THROUGH MYCHART.     Coupons for medication:      If you have any trouble affording your medications, check out www.goodrx.com for coupons and competitive prices in your area.  If you need further assistance, let us know so we can work with you and your insurance to make sure you get the best care.    Antoine Poche FNP-BC  [facility]   [facility]: [phone])- [phone]  [place]: [phone]

## 2022-06-17 NOTE — Progress Notes (Signed)
06/17/2022    Patient ID: Tracy [name]    Chief Complaint:    Chief Complaint   Patient presents with    Follow-up    Headache     History of Present Illness:  Tracy [name] is a 57 y.o. female who presents for follow up of headache, neck pain, and cognitive complaints/memory issues.  She was last seen by Dr. Frankey Shown, 08/20/21, where she was instructed to decrease Gabapentin to 100mg  daily as needed and follow up with vascular surgery.     June 17, 2022:  She is no longer on Gabapentin due to SE: "made me feel off."   She reports daily pain lasting "all day" that is pulsatile and may peak in intensity and become functionally incapcitating.   She notes pain that originates in her right skull base/neck and travels up and radiates frontally over her right ear and to her right eye with associated ipsilateral lacrimation.   Triggers: touching back of head and walking.     She has not yet scheduled an appointment with vascular surgery.     She reports memory issues and "difficulty remembering events coming up."   She has trouble remembering tasks at work and must write things down to remember.  She reports difficulty with verbal fluency.  She tells me that her sister passed away when she was 18yrs old due to dememtia (?)     Interval History 08/20/2021:   She is still noticing stabbing headache pain 3-4 days per month.   Has been treating with Sumatriptan (from another provider).   Had some sedation on Gabapentin 300 mg qHS dose.   Has not been able to get appointment with her previous Vascular Surgeon.   Interested in another opinion.     Interval History 07/02/2021:  The headaches start in the occipital, sub occipital, and travels up and to the temporal area.   Says these are different than the migraines she has had in the past.   Pain starts usually on the right, and then encompasses the entire back of her head.  She describes the pain as severe. Pain be shooting or stabbing.  The area is sensitive to the  touch.  At night the pain is more of a throbbing, pulsating. She has to sleep with a certain type of pillow for comfort as the entire back of head is painful  She denies photophobia, photophobia. Patient does report intermittent blurry vision of the right eye.  Patient's 30-day headache frequency/severity profile is at least 20 days/ several  The migraines she had in the past were frontal and were associated with photophobia, photophobia.  Patient did not trial ubrogepant due to cost  Over the weekend on 6/24 patient went to [facility] emergency room for severe headache.  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    Patient has an MRI scheduled for July 7 at [facility]  She has not seen vascular surgery in over a year, and  is interested in possible stenting if appropriate.    History Retained from Initial 05/17/2020 Visit:  Her home sleep study was normal.   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   She has a several year history of headaches.   States she had headaches in  her 82s.   They went away and came back.   Pain starts in the back of her head and moves to the front.   Tension or tightness.   Can be on either side.   Awakens her from sleep frequently.   Can worsen into a pulsating headache that lasts all day.   Associated with generalized weakness/fatigue, light and sound sensitivity, nausea.   Can last all day.   She treats with Butalbital-Acetaminophen-Caffeine for acute relief.   Works if "she catches it in time".  Worse with stress.   Ranges from 5-10 days per month.   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   Was seen at [facility].   Recommended non-operative management.   Seen in  ED for worsening headaches in 03/2020.   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   Seen by Vascular Surgery in late March.   Carotid duplex recommended.   States she is taking baby aspirin 81 mg.   She was evaluated by Dr. Peggye Pitt at [facility].   Follow up MRI was ordered.     Previous Preventive Medications:  Gabapentin  Topiramate    Previous Acute Medications:  Acetaminophen  Butalbital-Acetaminophen-Caffeine  Sumatriptan  Ubrogepant - prescribed but didn't try due to cost    Past Medical History:  Past Medical History:   Diagnosis Date    Anemia     Arthritis     Carotid stenosis     Diarrhea 12/2016    Since I had my dual switch surgery    Fatty liver 2017    GERD (gastroesophageal reflux disease)     Hepatic cirrhosis     Hiatal hernia     Hyperthyroidism 2013    Thyroid removed    Sleep apnea     not using CPAP machine    Vitamin D deficiency      Past Surgical History:  Past Surgical History:   Procedure Laterality Date    CHOLECYSTECTOMY  12/2016    COLONOSCOPY, WITH BIOPSY N/A 01/27/2018    Procedure: COLONOSCOPY, BIOPSY;  Surgeon: Shanon Brow, MD;  Location: [facility];  Service: Gastroenterology;  Laterality: N/A;    DUODENAL SWITCH  12/2016    EGD, BIOPSY N/A 01/27/2018    Procedure: EGD, BIOPSY;  Surgeon: Shanon Brow, MD;  Location: [facility];  Service: Gastroenterology;  Laterality: N/A;    GASTRECTOMY  09/2013    HYSTERECTOMY  2015    Partial.    LAPAROSCOPIC REVISION SLEEVE GASTRECTOMY TO GASTRIC BYPASS      LAPAROSCOPIC, GASTRECTOMY SLEEVE      SALPINGO OOPHERECTOMY Bilateral     THYROIDECTOMY, TOTAL  2013     Allergies:  Bee pollen and Vicodin [hydrocodone-acetaminophen]    Family History:  Family History   Problem Relation Age of Onset    Cancer Mother         hx ovarian    Hypertension Mother     Inflammatory bowel disease Mother     Heart failure Mother     Angina Mother     Migraines Mother     Asthma Father     Diabetes  Father     Hyperlipidemia Father     Hypertension Father     Cancer Father         Currently has cancer    Crohn's disease Father     Asthma Sister  Diabetes Sister     Hyperlipidemia Sister     Hypertension Sister     Early death Sister     Cancer Brother         hx of stomach    Hyperlipidemia Brother     Hypertension Brother     Migraines Brother     Learning disabilities Brother         Deaf    Breast cancer Paternal Aunt         died of     Breast cancer Other         maternal cousin-- survior    Obesity Son    No other family history related to current complaints.    Social History:  Social History     Tobacco Use    Smoking status: Never    Smokeless tobacco: Never    Tobacco comments:     None   Vaping Use    Vaping status: Never Used   Substance Use Topics    Alcohol use: No    Drug use: Not Currently     Medications:  Current Outpatient Medications   Medication Sig Dispense Refill    albuterol sulfate HFA (Proventil HFA) 108 (90 Base) MCG/ACT inhaler Inhale 2 puffs into the lungs every 4 (four) hours as needed for Wheezing or Shortness of Breath 18 g 0    albuterol sulfate HFA (PROVENTIL) 108 (90 Base) MCG/ACT inhaler Inhale 2 puffs into the lungs every 4 (four) hours as needed for Wheezing or Shortness of Breath (cough) 1 Inhaler 0    Armour Thyroid 15 MG tablet Take 1 tablet by mouth once daily 30 tablet 5    Armour Thyroid 60 MG tablet Take 1 tablet by mouth once daily 30 tablet 5    Ascorbic Acid (vitamin C) 250 MG tablet Take 1 tablet (250 mg) by mouth every other day 100 tablet 0    Beclomethasone Dipropionate (Qnasl) 80 MCG/ACT Aero Soln 2 sprays by Nasal route daily 1 each 0    Beclomethasone Dipropionate (Qnasl) 80 MCG/ACT Aero Soln 2 sprays by Nasal route daily 3 each 1    Biotin 5 MG/ML Liquid Take by mouth daily         butalbital-acetaminophen-caffeine (FIORICET) 50-325-40 MG per tablet Take 1 tablet by mouth every 4 (four) hours as needed for Headaches 15 tablet 0    calcium carbonate 1500  (600 Ca) MG Tab tablet Take 1 tablet (600 mg) by mouth      cetirizine (ZyrTEC Allergy) 10 MG tablet Take 1 tablet (10 mg) by mouth daily 90 tablet 1    COPPER PO Take by mouth      diclofenac Sodium (VOLTAREN) 1 % Gel topical gel Apply 4 g to affected area 4 times daily as needed.  Max: 16 g/joint/day.  Do not apply to broken, inflamed, or infected skin. 100 g 1    ferrous gluconate (FERGON) 324 MG tablet Take 1 tablet (324 mg) by mouth every other day 100 tablet 0    gabapentin (NEURONTIN) 100 MG capsule Take 1 capsule (100 mg) by mouth daily as needed (headache pain) 30 capsule 2    montelukast (SINGULAIR) 10 MG tablet TAKE 1 TABLET(10 MG) BY MOUTH EVERY NIGHT 90 tablet 1    montelukast (SINGULAIR) 10 MG tablet Take 1 tablet (10 mg) by mouth nightly 30 tablet 0    Multiple Vitamins-Minerals (MULTIVITAMIN WITH MINERALS) tablet Take 1 tablet by mouth daily  Multiple Vitamins-Minerals (ZINC PO) Take by mouth      Spacer/Aero-Holding Chambers (AEROCHAMBER Z-STAT PLUS/MEDIUM) inhaler Use as instructed 1 each 0    SUMAtriptan (IMITREX) 50 MG tablet Take 1 tablet (50 mg) by mouth as needed      thyroid (Armour Thyroid) 60 MG tablet Take 1 tablet (60 mg) by mouth daily 30 tablet 2    VITAMIN A PO Take by mouth      vitamin D (CHOLECALCIFEROL) 25 MCG (1000 UT) tablet Take 1 tablet (1,000 Units) by mouth daily       No current facility-administered medications for this visit.     General Exam:  BP 157/88 (BP Site: Left arm, Patient Position: Sitting, Cuff Size: Medium)   Pulse (!) 48   Resp 16   Ht 1.575 m (5\' 2" )   Wt 59 kg (130 lb)   LMP  (LMP Unknown)   SpO2 95%   BMI 23.78 kg/m   Neuro Exam:    MENTAL STATUS:  Awake, alert, oriented.  Attentive.  Follows commands.  Speech fluent, non-dysarthric, with normal naming.   CRANIAL NERVES:  EOMI.  Face symmetric.  Hearing grossly intact.  Shoulders symmetric.   MOTOR:  Moves all extremities symmetrically antigravity.  No adventitious movements.   COORDINATION:  No  dysmetria.   GAIT:  Normal gait and station.   + Tenderness along region of occipital nerve on the right. +Tinels sign.     Investigations:  Labs Reviewed:  Lab Results   Component Value Date    WBC 3.05 (L) 11/05/2021    HGB 9.6 (L) 11/05/2021    HCT 31.8 (L) 11/05/2021    MCV 82.6 11/05/2021    PLT 232 11/05/2021     '  Chemistry        Component Value Date/Time    NA 143 11/05/2021 1142    K 3.7 11/05/2021 1142    CL 111 11/05/2021 1142    CO2 24 11/05/2021 1142    BUN 12.0 11/05/2021 1142    CREAT 0.8 11/05/2021 1142    GLU 83 11/05/2021 1142        Component Value Date/Time    CA 8.6 11/05/2021 1142    ALKPHOS 145 (H) 11/05/2021 1142    AST 162 (H) 11/05/2021 1142    ALT 117 (H) 11/05/2021 1142    BILITOTAL 0.6 11/05/2021 1142        Imaging:  MRI Brain With and Without Contrast 07/12/2021:  IMPRESSION:   No new abnormality seen. Stable size of small nonspecific focus  of white matter change in the right cerebral hemisphere.     CTA Head/Neck 06/29/2021:  IMPRESSION:   1. Normal CT angio head. No intracranial large vessel occlusion or   stenosis   2. No significant carotid or vertebral artery stenosis in the neck   3. 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   Possible dissection or web.     Other Studies:  N/a    Records Reviewed:  Progress note by Dr. Frankey Shown     Impression:  Tracy [name] is a 57 y.o. female who presents for follow up of headaches, memory concerns and carotid artery irregularity.  Patient has a long standing history of migraine.  Headache phenotype is NDPH and occipital neuralgia on the right. Symptoms are chronic, severe and progressive.       Recommendations:  1. NDPH (new daily persistent headache)  2. Migraine without aura and without status migrainosus, not  intractable  3. Occipital neuralgia of right side  4. Worsening headaches  - Begin DULoxetine (CYMBALTA) 20 MG capsule; Take 1 capsule (20 mg) by mouth daily  Dispense: 30 capsule; Refill: 2  - Occipital Nerve  Block; Future  - MRI Brain WO Contrast; Future  - Avoid acute medication overuse headache and limiting acute medication (ie: triptans, NSAIDs, Tyelol) use to no more than 3 days per week.   - Follow the SEEDS for success in headache management, including Sleep hygiene, Exercising regularly, Eating healthy and regular meals, Drinking water, keeping a headache Diary, and Stress reduction.    5. Cognitive complaints  6. Family history of dementia  - Referral to Neurology ([place]); Future Dr. Abran Cantor   - I have request various blood studies including thyroid panel and B12 level.  - Vitamin D, 25 OH, Total; Future  - TSH; Future  - Homocysteine; Future  - Methylmalonic Acid; Future  - Vitamin B12; Future  - Emphasized the importance of pursuing the triad of regular physical exercise, "brain exercise" and socialization along with maintaining a "Mediterranean diet".     7. White matter abnormality on MRI of brain  - Stable across serial scans    8. Carotid artery disorder  - Stable across serial scans: carotid irregularity (web vs. Chronic dissection) is incidental.    - Continue aspirin  - She will set up follow up with vascular surgery  - Referral Vascular Surgery ([place]); Future        RTC in 2-3 months or sooner if new/worsenig neurologic symptoms.    Dr. Frankey Shown was available in a supervisory capacity.    Antoine Poche FNP-BC  [facility]   [facility]: [phone])- [phone]  [place]: [phone]   06/17/2022              A total of 55 minutes were spent on patient care, including but not limited to reviewing pt chart/reviewing test results, documenting in patient medical record, placing and reviewing orders, communication w/ other healthcare providers, performing physical exam, and face-to-face with the patient during the encounter spent on counseling/education and coordination of care.

## 2022-07-08 ENCOUNTER — Ambulatory Visit
Admission: RE | Admit: 2022-07-08 | Discharge: 2022-07-08 | Disposition: A | Discharge status: Home / Self Care | Payer: Medicaid Other | Source: Ambulatory Visit | Attending: Family Nurse Practitioner | Admitting: Family Nurse Practitioner

## 2022-07-08 ENCOUNTER — Other Ambulatory Visit: Payer: Self-pay | Admitting: Family Nurse Practitioner

## 2022-07-08 DIAGNOSIS — R519 Headache, unspecified: Secondary | ICD-10-CM

## 2022-07-09 ENCOUNTER — Ambulatory Visit: Payer: Medicaid Other

## 2022-07-09 ENCOUNTER — Ambulatory Visit
Admission: RE | Admit: 2022-07-09 | Discharge: 2022-07-09 | Disposition: A | Discharge status: Home / Self Care | Payer: Medicaid Other | Source: Ambulatory Visit | Attending: Family Nurse Practitioner | Admitting: Family Nurse Practitioner

## 2022-07-09 DIAGNOSIS — R519 Headache, unspecified: Secondary | ICD-10-CM | POA: Insufficient documentation

## 2022-07-15 ENCOUNTER — Other Ambulatory Visit (INDEPENDENT_AMBULATORY_CARE_PROVIDER_SITE_OTHER): Payer: Self-pay | Admitting: "Endocrinology

## 2022-07-15 DIAGNOSIS — E890 Postprocedural hypothyroidism: Secondary | ICD-10-CM

## 2022-07-17 ENCOUNTER — Encounter: Payer: Self-pay | Admitting: Family Nurse Practitioner

## 2022-07-23 ENCOUNTER — Other Ambulatory Visit: Payer: Self-pay | Admitting: Family Nurse Practitioner

## 2022-07-23 ENCOUNTER — Encounter (INDEPENDENT_AMBULATORY_CARE_PROVIDER_SITE_OTHER): Payer: Self-pay

## 2022-07-23 ENCOUNTER — Other Ambulatory Visit (INDEPENDENT_AMBULATORY_CARE_PROVIDER_SITE_OTHER): Payer: Self-pay

## 2022-07-23 ENCOUNTER — Encounter (INDEPENDENT_AMBULATORY_CARE_PROVIDER_SITE_OTHER): Payer: Self-pay | Admitting: Specialist

## 2022-07-23 ENCOUNTER — Other Ambulatory Visit (FREE_STANDING_LABORATORY_FACILITY): Payer: Medicaid Other

## 2022-07-23 ENCOUNTER — Ambulatory Visit (INDEPENDENT_AMBULATORY_CARE_PROVIDER_SITE_OTHER): Payer: Medicaid Other | Admitting: Specialist

## 2022-07-23 VITALS — BP 170/80 | HR 56 | Ht 62.0 in | Wt 130.0 lb

## 2022-07-23 DIAGNOSIS — I6521 Occlusion and stenosis of right carotid artery: Secondary | ICD-10-CM | POA: Insufficient documentation

## 2022-07-23 DIAGNOSIS — R419 Unspecified symptoms and signs involving cognitive functions and awareness: Secondary | ICD-10-CM

## 2022-07-23 DIAGNOSIS — I779 Disorder of arteries and arterioles, unspecified: Secondary | ICD-10-CM

## 2022-07-23 DIAGNOSIS — Z8679 Personal history of other diseases of the circulatory system: Secondary | ICD-10-CM

## 2022-07-23 NOTE — Progress Notes (Addendum)
Appt Date/Time: 07/23/2022 @ 1550    57 y.o. female   PCP: Jules Husbands, DO  Referring Physician:   Finis Bud, FNP       Appointment Type: New Patient  Imaging:  No Imaging Scheduled    Chief Complaint/HPI: Consult for carotid artery disorder  -Pt c/o headache and neck pain beginning on 05/17/2020 (3-4 days per month)  Date of Last Visit:  Last seen by Dr. Cliffton Asters in March 2022    Allergies:   Allergies   Allergen Reactions    Bee Pollen Swelling     "Bee Stings".Marland KitchenMarland KitchenMarland KitchenStuffy nose; "throat swelling"      Vicodin [Hydrocodone-Acetaminophen] Nausea And Vomiting         Selected Medications:   None    Focused Prior Medical History:  none    Social Determinants of Health:  - What Matters Most:  - ETOH: __X____   Depression: __X___  - Tobacco:         __ Smoker         _ Former Smoker       X    Never Smoker      Vital Signs this Visit Pulses  HT  BP R BP L Temp   Rad Ulnar Brach Fem Pop DP PT        R          WT  Pulse R Pulse L SpO2       L          Imaging results:        Plan of Care:

## 2022-07-23 NOTE — Progress Notes (Signed)
OFFICE VISIT  Vascular and Endovascular Surgery      Date: July 23, 2022  Vascular Surgeon: Romelle Starcher, MD   Chief Complaint:    Chief Complaint   Patient presents with    Consult (Initial)     Consult for carotid artery disorder       History of Present Illness     Tracy [name] is a 57 y.o. female who presents in office for vascular consultation of carotid artery disorder as referred by neurology FNP Antoine Poche. Patient was previously seen by Dr. 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 A 07/09/22 MRI brain WO contrast study performed at [facility] for new or worsening headaches revealed no acute intracranial abnormality. Stable exam when compared to prior brain MRI on 07/12/2021. A few foci of T2/FLAIR prolongation in the subcortical and periventricular white matter are nonspecific. Statistically, these are related to chronic small vessel ischemic changes. Other considerations include chronic headache/migraine, prior infection/inflammation or vasculitis. Demyelinating process remains in the differential in the appropriate clinical setting. 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 Possible dissection or web.     The patient reports she would like a second opinion with regard to her carotid history and treatment options. She currently denies any symptoms of lateralizing neurologic ischemia including stroke, TIA, unilateral weakness/numbness or amaurosis fugax. She does note ongoing speech difficulties to include word finding and changes in vision that has been treated with vitamin A. She also reports a decrease in muscle mass.     She is medically managed on ASA EOD and fish oil. She is not currently on statin therapy. She does not smoke.     Past Medical History     Past Medical History:    Diagnosis Date    Anemia     Arthritis     Carotid stenosis     Diarrhea 12/2016    Since I had my dual switch surgery    Fatty liver 2017    GERD (gastroesophageal reflux disease)     Hepatic cirrhosis     Hiatal hernia     Hyperthyroidism 2013    Thyroid removed    Sleep apnea     not using CPAP machine    Vitamin D deficiency        Past Surgical History     Past Surgical History:   Procedure Laterality Date    CHOLECYSTECTOMY  12/2016    COLONOSCOPY, WITH BIOPSY N/A 01/27/2018    Procedure: COLONOSCOPY, BIOPSY;  Surgeon: Shanon Brow, MD;  Location: [facility];  Service: Gastroenterology;  Laterality: N/A;    DUODENAL SWITCH  12/2016    EGD, BIOPSY N/A 01/27/2018    Procedure: EGD, BIOPSY;  Surgeon: Shanon Brow, MD;  Location: [facility];  Service: Gastroenterology;  Laterality: N/A;    GASTRECTOMY  09/2013    HYSTERECTOMY  2015    Partial.    LAPAROSCOPIC REVISION SLEEVE GASTRECTOMY TO GASTRIC BYPASS      LAPAROSCOPIC, GASTRECTOMY SLEEVE      SALPINGO OOPHERECTOMY Bilateral     THYROIDECTOMY, TOTAL  2013       Family History      Family History   Problem Relation Age of Onset    Cancer Mother         hx  ovarian    Hypertension Mother     Inflammatory bowel disease Mother     Heart failure Mother     Angina Mother     Migraines Mother     Asthma Father     Diabetes Father     Hyperlipidemia Father     Hypertension Father     Cancer Father         Currently has cancer    Crohn's disease Father     Asthma Sister     Diabetes Sister     Hyperlipidemia Sister     Hypertension Sister     Early death Sister     Cancer Brother         hx of stomach    Hyperlipidemia Brother     Hypertension Brother     Migraines Brother     Learning disabilities Brother         Deaf    Breast cancer Paternal Aunt         died of     Breast cancer Other         maternal cousin-- survior    Obesity Son        Social Histroy     Social History     Tobacco Use    Smoking status: Never    Smokeless tobacco: Never     Tobacco comments:     None   Vaping Use    Vaping status: Never Used   Substance Use Topics    Alcohol use: No    Drug use: Not Currently       Allergies     Allergies   Allergen Reactions    Bee Pollen Swelling     "Bee Stings".Marland KitchenMarland KitchenMarland KitchenStuffy nose; "throat swelling"      Vicodin [Hydrocodone-Acetaminophen] Nausea And Vomiting       Medications     Current Outpatient Medications:     albuterol sulfate HFA (Proventil HFA) 108 (90 Base) MCG/ACT inhaler, Inhale 2 puffs into the lungs every 4 (four) hours as needed for Wheezing or Shortness of Breath, Disp: 18 g, Rfl: 0    albuterol sulfate HFA (PROVENTIL) 108 (90 Base) MCG/ACT inhaler, Inhale 2 puffs into the lungs every 4 (four) hours as needed for Wheezing or Shortness of Breath (cough), Disp: 1 Inhaler, Rfl: 0    Armour Thyroid 15 MG tablet, Take 1 tablet by mouth once daily, Disp: 30 tablet, Rfl: 0    Armour Thyroid 60 MG tablet, Take 1 tablet by mouth once daily, Disp: 30 tablet, Rfl: 5    Ascorbic Acid (vitamin C) 250 MG tablet, Take 1 tablet (250 mg) by mouth every other day, Disp: 100 tablet, Rfl: 0    Beclomethasone Dipropionate (Qnasl) 80 MCG/ACT Aero Soln, 2 sprays by Nasal route daily, Disp: 1 each, Rfl: 0    Beclomethasone Dipropionate (Qnasl) 80 MCG/ACT Aero Soln, 2 sprays by Nasal route daily, Disp: 3 each, Rfl: 1    Biotin 5 MG/ML Liquid, Take by mouth daily  , Disp: , Rfl:     butalbital-acetaminophen-caffeine (FIORICET) 50-325-40 MG per tablet, Take 1 tablet by mouth every 4 (four) hours as needed for Headaches, Disp: 15 tablet, Rfl: 0    calcium carbonate 1500 (600 Ca) MG Tab tablet, Take 1 tablet (600 mg) by mouth, Disp: , Rfl:     cetirizine (ZyrTEC Allergy) 10 MG tablet, Take 1 tablet (10 mg) by mouth daily, Disp: 90 tablet, Rfl: 1  COPPER PO, Take by mouth, Disp: , Rfl:     diclofenac Sodium (VOLTAREN) 1 % Gel topical gel, Apply 4 g to affected area 4 times daily as needed.  Max: 16 g/joint/day.  Do not apply to broken, inflamed, or infected  skin., Disp: 100 g, Rfl: 1    DULoxetine (CYMBALTA) 20 MG capsule, Take 1 capsule (20 mg) by mouth daily, Disp: 30 capsule, Rfl: 2    ferrous gluconate (FERGON) 324 MG tablet, Take 1 tablet (324 mg) by mouth every other day, Disp: 100 tablet, Rfl: 0    montelukast (SINGULAIR) 10 MG tablet, TAKE 1 TABLET(10 MG) BY MOUTH EVERY NIGHT, Disp: 90 tablet, Rfl: 1    montelukast (SINGULAIR) 10 MG tablet, Take 1 tablet (10 mg) by mouth nightly, Disp: 30 tablet, Rfl: 0    Multiple Vitamins-Minerals (MULTIVITAMIN WITH MINERALS) tablet, Take 1 tablet by mouth daily, Disp: , Rfl:     Multiple Vitamins-Minerals (ZINC PO), Take by mouth, Disp: , Rfl:     Spacer/Aero-Holding Chambers (AEROCHAMBER Z-STAT PLUS/MEDIUM) inhaler, Use as instructed, Disp: 1 each, Rfl: 0    SUMAtriptan (IMITREX) 50 MG tablet, Take 1 tablet (50 mg) by mouth as needed, Disp: , Rfl:     VITAMIN A PO, Take by mouth, Disp: , Rfl:     vitamin D (CHOLECALCIFEROL) 25 MCG (1000 UT) tablet, Take 1 tablet (1,000 Units) by mouth daily, Disp: , Rfl:     Review of Systems   A comprehensive twelve point review of systems was: Negative in the neurologic, respiratory, cardiac, vascular, gastrointestinal, genitourinary, musculoskeletal, immunologic, psychiatric or endocrinologic systems except as mentioned above in HPI.       Physical Exam     Vitals:    07/23/22 1548   BP: 170/80   BP Site: Right arm   Patient Position: Sitting   Cuff Size: Medium   Pulse: (!) 56   SpO2: 99%   Weight: 59 kg (130 lb)   Height: 1.575 m (5\' 2" )       Body mass index is 23.78 kg/m.    General:  Patient appears their stated age, well-nourished.  Alert and in no apparent distress.  HEENT:  Normocephalic.   Neck:  No JVD  Lungs: Respiratory effort unlabored, chest expansion symmetric.  Cardiac: RRR,  no rubs, gallops or murmurs.    Abd: Soft, nondistended, nontender.   Extremities: FROM, symmetric. No ulcers, gangrene or non-healing wound.      Skin: Color appropriate for race, Skin warm, dry,  no gangrene, no non healing ulcers, no varicose veins , no hyperpigmentation, no lipo-dermatosclerosis  Neuro: Good insight and judgment, oriented to person, place, and time CN II-XII intact, gross motor and sensory intact     Labs     CBC:   WBC   Date/Time Value Ref Range Status   11/05/2021 11:42 AM 3.05 (L) 3.10 - 9.50 x10 3/uL Final     RBC   Date/Time Value Ref Range Status   11/05/2021 11:42 AM 3.85 (L) 3.90 - 5.10 x10 6/uL Final     Hgb   Date/Time Value Ref Range Status   11/05/2021 11:42 AM 9.6 (L) 11.4 - 14.8 g/dL Final     Hematocrit   Date/Time Value Ref Range Status   11/05/2021 11:42 AM 31.8 (L) 34.7 - 43.7 % Final     MCV   Date/Time Value Ref Range Status   11/05/2021 11:42 AM 82.6 78.0 - 96.0 fL Final     MCHC  Date/Time Value Ref Range Status   11/05/2021 11:42 AM 30.2 (L) 31.5 - 35.8 g/dL Final     RDW   Date/Time Value Ref Range Status   11/05/2021 11:42 AM 17 (H) 11 - 15 % Final     Platelets   Date/Time Value Ref Range Status   11/05/2021 11:42 AM 232 142 - 346 x10 3/uL Final       CMP:   Sodium   Date/Time Value Ref Range Status   11/05/2021 11:42 AM 143 135 - 145 mEq/L Final     Potassium   Date/Time Value Ref Range Status   11/05/2021 11:42 AM 3.7 3.5 - 5.3 mEq/L Final     Chloride   Date/Time Value Ref Range Status   11/05/2021 11:42 AM 111 99 - 111 mEq/L Final     CO2   Date/Time Value Ref Range Status   11/05/2021 11:42 AM 24 17 - 29 mEq/L Final     Glucose   Date/Time Value Ref Range Status   11/05/2021 11:42 AM 83 70 - 100 mg/dL Final     Comment:     ADA guidelines for diabetes mellitus:  Fasting:  Equal to or greater than 126 mg/dL  Random:   Equal to or greater than 200 mg/dL       BUN   Date/Time Value Ref Range Status   11/05/2021 11:42 AM 12.0 7.0 - 21.0 mg/dL Final     Protein, Total   Date/Time Value Ref Range Status   11/05/2021 11:42 AM 5.9 (L) 6.0 - 8.3 g/dL Final     Alkaline Phosphatase   Date/Time Value Ref Range Status   11/05/2021 11:42 AM 145 (H) 37 - 117 U/L Final      AST (SGOT)   Date/Time Value Ref Range Status   11/05/2021 11:42 AM 162 (H) 5 - 41 U/L Final     ALT   Date/Time Value Ref Range Status   11/05/2021 11:42 AM 117 (H) 0 - 55 U/L Final     Anion Gap   Date/Time Value Ref Range Status   11/05/2021 11:42 AM 8.0 5.0 - 15.0 Final     Comment:     Calculated AGAP = Na - (CL + CO2)  Interpret with caution; calculated AGAP may not  reflect patient's true clinical status.  This is a calculated value and platform-dependent.  A value >12.0 has been recommended for the management of  Hyperglycemic Crises: Diabetic Ketoacidosis and Hyperglycemic  Hyperosmolar State. Med Clin [place]. 2017;101(3):587-606.  doi:10.1016/j.mcna.2016.12.011         Lipid Panel   Cholesterol   Date/Time Value Ref Range Status   08/03/2020 09:37 AM 96 0 - 199 mg/dL Final     Triglycerides   Date/Time Value Ref Range Status   08/03/2020 09:37 AM 36 34 - 149 mg/dL Final     HDL   Date/Time Value Ref Range Status   08/03/2020 09:37 AM 32 (L) 40 - 9,999 mg/dL Final     Comment:     An HDL cholesterol <40 mg/dL is low and constitutes a  coronary heart disease risk factor, and HDL-C>59 mg/dL is  a negative risk factor for CHD.  Ref: American Heart Association; Circulation 2004         Coags:   PT   Date/Time Value Ref Range Status   08/15/2017 12:37 PM 14.2 12.6 - 15.0 sec Final     PT INR   Date/Time Value Ref Range Status  08/15/2017 12:37 PM 1.1 0.9 - 1.1 Final     Comment:     Recommended Ranges for Protime INR:    2.0-3.0 for most medical and surgical thromboembolic states    2.5-3.5 for artificial heart valves  INR result may not represent exact Warfarin dosing level during  the transition period from Heparin to Warfarin therapy.  Results should be interpreted based on current anticoagulant  therapy and patient's clinical presentation.       PTT   Date/Time Value Ref Range Status   08/14/2017 08:39 AM 27 23 - 37 sec Final     Comment:     In vivo therapeutic range of heparin (0.3 - 0.7  IU/mL)  correlate with the following APTT times: 64 - 102 seconds.  Results should be interpreted based on current anticoagulant  therapy and patient's clinical presentation.           Diagnostic Imaging     I have reviewed and interpreted the vascular diagnostic imaging with the patient    Assessment and Plan       1. Carotid artery disorder  Referral Vascular Surgery ([place])      Tracy [name] is a 57 y.o. female who presents in office for vascular consultation of carotid artery disorder as referred by neurology FNP Antoine Poche. Patient was previously seen by Dr. 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 A 07/09/22 MRI brain WO contrast study performed at [facility] for new or worsening headaches revealed no acute intracranial abnormality. Stable exam when compared to prior brain MRI on 07/12/2021. A few foci of T2/FLAIR prolongation in the subcortical and periventricular white matter are nonspecific. Statistically, these are related to chronic small vessel ischemic changes. Other considerations include chronic headache/migraine, prior infection/inflammation or vasculitis. Demyelinating process remains in the differential in the appropriate clinical setting. 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 Possible dissection or web.   My impression is that her symptoms which are very nonspecific do not appear to be lateralizing symptoms and secondary to carotid disease however I will obtain a repeat carotid duplex ultrasound examination and depending on the findings then we will make the final decision with respect to the right carotid artery.  This was discussed with the patient in detail and she understands unless she has greater than 70% stenosis no intervention is required at the present time.  We will  defer the final recommendation after the duplex has been completed.    Revonda Standard, I would like to thank you for allowing me to participate in the care of this patient.    Best regards    Romelle Starcher, MD, FACS, RPVI  [facility]  Chief, Section of Vascular Surgery    [facility]      This note was generated by the Epic EMR system/ Dragon speech recognition and may contain inherent errors or omissions not intended by the user. Grammatical errors, random word insertions, deletions, pronoun errors and incomplete sentences are occasional consequences of this technology due to software limitations. Not all errors are caught or corrected. If there are questions or concerns about the content of this note or information contained within the body of this dictation they should be addressed directly with the author for clarification

## 2022-07-24 LAB — VITAMIN D, 25 OH, TOTAL: Vitamin D 25-OH, Total: 20 ng/mL — ABNORMAL LOW (ref 30–100)

## 2022-07-24 LAB — HOMOCYSTEINE: Homocysteine: 13.22 umol/L (ref 5.08–15.39)

## 2022-07-24 LAB — T4, TOTAL: T4 Total: 5.7 ug/dL (ref 4.9–11.7)

## 2022-07-24 LAB — TSH: TSH: 0.2 u[IU]/mL — ABNORMAL LOW (ref 0.35–4.94)

## 2022-07-24 LAB — VITAMIN B12: Vitamin B-12: 600 pg/mL (ref 211–911)

## 2022-07-25 ENCOUNTER — Other Ambulatory Visit: Payer: Self-pay | Admitting: Family Nurse Practitioner

## 2022-07-25 ENCOUNTER — Encounter: Payer: Self-pay | Admitting: Family Nurse Practitioner

## 2022-07-25 DIAGNOSIS — E559 Vitamin D deficiency, unspecified: Secondary | ICD-10-CM

## 2022-07-25 MED ORDER — VITAMIN D (ERGOCALCIFEROL) 1.25 MG (50000 UT) PO CAPS
50000.0000 [IU] | ORAL_CAPSULE | ORAL | 0 refills | Status: DC
Start: 2022-07-25 — End: 2023-01-21

## 2022-07-25 NOTE — Progress Notes (Signed)
Documentation of New problem or New medication request addressed with patient. Verbal consent was obtained in MyChart.     Review of patient record and data pertinent of assessment of the problem.  Development of a management plan.  Generation of a prescription or test order.    1. Vitamin D insufficiency  - Vitamin D, ergocalciferol, (DRISDOL) 50000 UNIT Cap; Take 1 capsule (50,000 Units) by mouth once a week  Dispense: 8 capsule; Refill: 0    Time spent reviewing the patient generated inquiry, including above work: 12 minutes.

## 2022-07-26 LAB — METHYLMALONIC ACID: Methylmalonic Acid: 127 nmol/L (ref 55–335)

## 2022-07-29 ENCOUNTER — Ambulatory Visit (INDEPENDENT_AMBULATORY_CARE_PROVIDER_SITE_OTHER): Payer: Medicaid Other

## 2022-07-29 DIAGNOSIS — Z8679 Personal history of other diseases of the circulatory system: Secondary | ICD-10-CM

## 2022-07-29 DIAGNOSIS — I6521 Occlusion and stenosis of right carotid artery: Secondary | ICD-10-CM

## 2022-08-08 ENCOUNTER — Encounter (INDEPENDENT_AMBULATORY_CARE_PROVIDER_SITE_OTHER): Payer: Self-pay

## 2022-08-08 NOTE — Progress Notes (Signed)
Appt Date/Time: 08/13/2022 @ 1510    57 y.o. female   PCP: Jules Husbands, DO  Referring Physician:         Appointment Type: Follow-Up  Imaging: [place] - [employer]  07/29/2022 Carotid Duplex    Chief Complaint/HPI: F/u of carotid stenosis to review carotid duplex ultrasound  -c/o headache and neck pain beginning on 05/17/2020 (3-4 days per month)   -Pt previously seen by Dr. 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 Lucianne Muss)    Allergies: Allergies[1]      Selected Medications:   none    Focused Prior Medical History:  none    Social Determinants of Health:  - What Matters Most:  - ETOH: __X____   Depression: __X___  - Tobacco:         __ Smoker         _ Former Smoker     X      Never Smoker      Vital Signs this Visit Pulses  HT  BP R BP L Temp   Rad Ulnar Brach Fem Pop DP PT        R          WT  Pulse R Pulse L SpO2       L          Imaging results:        Plan of Care:               [1]   Allergies  Allergen Reactions    Bee Pollen Swelling     "Bee Stings".Marland KitchenMarland KitchenMarland KitchenStuffy nose; "throat swelling"      Vicodin [Hydrocodone-Acetaminophen] Nausea And Vomiting

## 2022-08-12 ENCOUNTER — Other Ambulatory Visit (INDEPENDENT_AMBULATORY_CARE_PROVIDER_SITE_OTHER): Payer: Self-pay | Admitting: "Endocrinology

## 2022-08-12 DIAGNOSIS — E890 Postprocedural hypothyroidism: Secondary | ICD-10-CM

## 2022-08-13 ENCOUNTER — Ambulatory Visit (INDEPENDENT_AMBULATORY_CARE_PROVIDER_SITE_OTHER): Payer: Medicaid Other | Admitting: Specialist

## 2022-08-13 ENCOUNTER — Encounter (INDEPENDENT_AMBULATORY_CARE_PROVIDER_SITE_OTHER): Payer: Self-pay | Admitting: Specialist

## 2022-08-13 VITALS — BP 166/95 | HR 56 | Ht 62.0 in | Wt 130.0 lb

## 2022-08-13 DIAGNOSIS — I6521 Occlusion and stenosis of right carotid artery: Secondary | ICD-10-CM

## 2022-08-13 DIAGNOSIS — G43009 Migraine without aura, not intractable, without status migrainosus: Secondary | ICD-10-CM

## 2022-08-13 NOTE — Progress Notes (Signed)
OFFICE VISIT  Vascular and Endovascular Surgery      Date: August 13, 2022  Vascular Surgeon: Romelle Starcher, MD   Chief Complaint:    Chief Complaint   Patient presents with    Follow-up     F/u of carotid stenosis to review carotid duplex ultrasound       History of Present Illness     Tracy [name] is a 57 y.o. female who presents in office for vascular consultation of carotid artery disorder as referred by neurology FNP Antoine Poche. Patient was previously seen by Dr. 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 A 07/09/22 MRI brain WO contrast study performed at [facility] for new or worsening headaches revealed no acute intracranial abnormality. Stable exam when compared to prior brain MRI on 07/12/2021. A few foci of T2/FLAIR prolongation in the subcortical and periventricular white matter are nonspecific. Statistically, these are related to chronic small vessel ischemic changes. Other considerations include chronic headache/migraine, prior infection/inflammation or vasculitis. Demyelinating process remains in the differential in the appropriate clinical setting. A 06/29/2021 CTA head and neck study performed at [facility] revealed no significant carotid or vertebral artery stenosis in the neck, focal web-like stenosis proximal right internal carotid artery. Possible dissection or web.     The patient reports she would like a second opinion with regard to her carotid history and treatment options. She currently denies any symptoms of lateralizing neurologic ischemia including stroke, TIA, unilateral weakness/numbness or amaurosis fugax. She does note ongoing speech difficulties to include word finding and changes in vision that has been treated with vitamin A. She also reports a decrease in muscle mass.   Patient has undergone follow-up carotid duplex ultrasound examination.  She is medically managed on ASA EOD and fish oil. She is not currently on  statin therapy. She does not smoke.     Past Medical History     Past Medical History:   Diagnosis Date    Anemia     Arthritis     Carotid stenosis     Diarrhea 12/2016    Since I had my dual switch surgery    Fatty liver 2017    GERD (gastroesophageal reflux disease)     Hepatic cirrhosis     Hiatal hernia     Hyperthyroidism 2013    Thyroid removed    Sleep apnea     not using CPAP machine    Vitamin D deficiency        Past Surgical History     Past Surgical History:   Procedure Laterality Date    CHOLECYSTECTOMY  12/2016    COLONOSCOPY, WITH BIOPSY N/A 01/27/2018    Procedure: COLONOSCOPY, BIOPSY;  Surgeon: Shanon Brow, MD;  Location: [facility];  Service: Gastroenterology;  Laterality: N/A;    DUODENAL SWITCH  12/2016    EGD, BIOPSY N/A 01/27/2018    Procedure: EGD, BIOPSY;  Surgeon: Shanon Brow, MD;  Location: [facility];  Service: Gastroenterology;  Laterality: N/A;    GASTRECTOMY  09/2013    HYSTERECTOMY  2015    Partial.    LAPAROSCOPIC REVISION SLEEVE GASTRECTOMY TO GASTRIC BYPASS      LAPAROSCOPIC, GASTRECTOMY SLEEVE      SALPINGO OOPHERECTOMY Bilateral     THYROIDECTOMY, TOTAL  2013       Family History      Family History   Problem Relation Age of Onset    Cancer  Mother         hx ovarian    Hypertension Mother     Inflammatory bowel disease Mother     Heart failure Mother     Angina Mother     Migraines Mother     Asthma Father     Diabetes Father     Hyperlipidemia Father     Hypertension Father     Cancer Father         Currently has cancer    Crohn's disease Father     Asthma Sister     Diabetes Sister     Hyperlipidemia Sister     Hypertension Sister     Early death Sister     Cancer Brother         hx of stomach    Hyperlipidemia Brother     Hypertension Brother     Migraines Brother     Learning disabilities Brother         Deaf    Breast cancer Paternal Aunt         died of     Breast cancer Other         maternal cousin-- survior    Obesity Son        Social Histroy      Social History     Tobacco Use    Smoking status: Never    Smokeless tobacco: Never    Tobacco comments:     None   Vaping Use    Vaping status: Never Used   Substance Use Topics    Alcohol use: No    Drug use: Not Currently       Allergies     Allergies   Allergen Reactions    Bee Pollen Swelling     "Bee Stings".Marland KitchenMarland KitchenMarland KitchenStuffy nose; "throat swelling"      Vicodin [Hydrocodone-Acetaminophen] Nausea And Vomiting       Medications     Current Outpatient Medications:     albuterol sulfate HFA (PROVENTIL) 108 (90 Base) MCG/ACT inhaler, Inhale 2 puffs into the lungs every 4 (four) hours as needed for Wheezing or Shortness of Breath (cough), Disp: 1 Inhaler, Rfl: 0    Armour Thyroid 15 MG tablet, Take 1 tablet by mouth once daily, Disp: 90 tablet, Rfl: 0    Armour Thyroid 60 MG tablet, Take 1 tablet by mouth once daily, Disp: 30 tablet, Rfl: 5    Ascorbic Acid (vitamin C) 250 MG tablet, Take 1 tablet (250 mg) by mouth every other day, Disp: 100 tablet, Rfl: 0    Beclomethasone Dipropionate (Qnasl) 80 MCG/ACT Aero Soln, 2 sprays by Nasal route daily, Disp: 3 each, Rfl: 1    Biotin 5 MG/ML Liquid, Take by mouth daily  , Disp: , Rfl:     butalbital-acetaminophen-caffeine (FIORICET) 50-325-40 MG per tablet, Take 1 tablet by mouth every 4 (four) hours as needed for Headaches, Disp: 15 tablet, Rfl: 0    calcium carbonate 1500 (600 Ca) MG Tab tablet, Take 1 tablet (600 mg) by mouth, Disp: , Rfl:     cetirizine (ZyrTEC Allergy) 10 MG tablet, Take 1 tablet (10 mg) by mouth daily, Disp: 90 tablet, Rfl: 1    COPPER PO, Take by mouth, Disp: , Rfl:     diclofenac Sodium (VOLTAREN) 1 % Gel topical gel, Apply 4 g to affected area 4 times daily as needed.  Max: 16 g/joint/day.  Do not apply to broken, inflamed, or infected skin., Disp: 100  g, Rfl: 1    DULoxetine (CYMBALTA) 20 MG capsule, Take 1 capsule (20 mg) by mouth daily, Disp: 30 capsule, Rfl: 2    ferrous gluconate (FERGON) 324 MG tablet, Take 1 tablet (324 mg) by mouth every  other day, Disp: 100 tablet, Rfl: 0    montelukast (SINGULAIR) 10 MG tablet, TAKE 1 TABLET(10 MG) BY MOUTH EVERY NIGHT, Disp: 90 tablet, Rfl: 1    Multiple Vitamins-Minerals (MULTIVITAMIN WITH MINERALS) tablet, Take 1 tablet by mouth daily, Disp: , Rfl:     Multiple Vitamins-Minerals (ZINC PO), Take by mouth, Disp: , Rfl:     Spacer/Aero-Holding Chambers (AEROCHAMBER Z-STAT PLUS/MEDIUM) inhaler, Use as instructed, Disp: 1 each, Rfl: 0    SUMAtriptan (IMITREX) 50 MG tablet, Take 1 tablet (50 mg) by mouth as needed, Disp: , Rfl:     VITAMIN A PO, Take by mouth, Disp: , Rfl:     vitamin D (CHOLECALCIFEROL) 25 MCG (1000 UT) tablet, Take 1 tablet (1,000 Units) by mouth daily, Disp: , Rfl:     vitamin D, ergocalciferol, (DRISDOL) 50000 UNIT Cap, Take 1 capsule (50,000 Units) by mouth once a week, Disp: 8 capsule, Rfl: 0    albuterol sulfate HFA (Proventil HFA) 108 (90 Base) MCG/ACT inhaler, Inhale 2 puffs into the lungs every 4 (four) hours as needed for Wheezing or Shortness of Breath, Disp: 18 g, Rfl: 0    Beclomethasone Dipropionate (Qnasl) 80 MCG/ACT Aero Soln, 2 sprays by Nasal route daily, Disp: 1 each, Rfl: 0    montelukast (SINGULAIR) 10 MG tablet, Take 1 tablet (10 mg) by mouth nightly, Disp: 30 tablet, Rfl: 0    Review of Systems   A comprehensive twelve point review of systems was: Negative in the neurologic, respiratory, cardiac, vascular, gastrointestinal, genitourinary, musculoskeletal, immunologic, psychiatric or endocrinologic systems except as mentioned above in HPI.       Physical Exam     Vitals:    08/13/22 1505   BP: (!) 166/95   BP Site: Right arm   Patient Position: Sitting   Cuff Size: Medium   Pulse: (!) 56   Weight: 59 kg (130 lb)   Height: 1.575 m (5\' 2" )       Body mass index is 23.78 kg/m.    General:  Patient appears their stated age, well-nourished.  Alert and in no apparent distress.  HEENT:  Normocephalic.   Neck:  No JVD  Lungs: Respiratory effort unlabored, chest expansion  symmetric.  Cardiac: RRR,  no rubs, gallops or murmurs.    Abd: Soft, nondistended, nontender.   Extremities: FROM, symmetric. No ulcers, gangrene or non-healing wound.      Skin: Color appropriate for race, Skin warm, dry, no gangrene, no non healing ulcers, no varicose veins , no hyperpigmentation, no lipo-dermatosclerosis  Neuro: Good insight and judgment, oriented to person, place, and time CN II-XII intact, gross motor and sensory intact     Labs     CBC:   WBC   Date/Time Value Ref Range Status   11/05/2021 11:42 AM 3.05 (L) 3.10 - 9.50 x10 3/uL Final     RBC   Date/Time Value Ref Range Status   11/05/2021 11:42 AM 3.85 (L) 3.90 - 5.10 x10 6/uL Final     Hgb   Date/Time Value Ref Range Status   11/05/2021 11:42 AM 9.6 (L) 11.4 - 14.8 g/dL Final     Hematocrit   Date/Time Value Ref Range Status   11/05/2021 11:42 AM 31.8 (L) 34.7 -  43.7 % Final     MCV   Date/Time Value Ref Range Status   11/05/2021 11:42 AM 82.6 78.0 - 96.0 fL Final     MCHC   Date/Time Value Ref Range Status   11/05/2021 11:42 AM 30.2 (L) 31.5 - 35.8 g/dL Final     RDW   Date/Time Value Ref Range Status   11/05/2021 11:42 AM 17 (H) 11 - 15 % Final     Platelets   Date/Time Value Ref Range Status   11/05/2021 11:42 AM 232 142 - 346 x10 3/uL Final       CMP:   Sodium   Date/Time Value Ref Range Status   11/05/2021 11:42 AM 143 135 - 145 mEq/L Final     Potassium   Date/Time Value Ref Range Status   11/05/2021 11:42 AM 3.7 3.5 - 5.3 mEq/L Final     Chloride   Date/Time Value Ref Range Status   11/05/2021 11:42 AM 111 99 - 111 mEq/L Final     CO2   Date/Time Value Ref Range Status   11/05/2021 11:42 AM 24 17 - 29 mEq/L Final     Glucose   Date/Time Value Ref Range Status   11/05/2021 11:42 AM 83 70 - 100 mg/dL Final     Comment:     ADA guidelines for diabetes mellitus:  Fasting:  Equal to or greater than 126 mg/dL  Random:   Equal to or greater than 200 mg/dL       BUN   Date/Time Value Ref Range Status   11/05/2021 11:42 AM 12.0 7.0 - 21.0  mg/dL Final     Protein, Total   Date/Time Value Ref Range Status   11/05/2021 11:42 AM 5.9 (L) 6.0 - 8.3 g/dL Final     Alkaline Phosphatase   Date/Time Value Ref Range Status   11/05/2021 11:42 AM 145 (H) 37 - 117 U/L Final     AST (SGOT)   Date/Time Value Ref Range Status   11/05/2021 11:42 AM 162 (H) 5 - 41 U/L Final     ALT   Date/Time Value Ref Range Status   11/05/2021 11:42 AM 117 (H) 0 - 55 U/L Final     Anion Gap   Date/Time Value Ref Range Status   11/05/2021 11:42 AM 8.0 5.0 - 15.0 Final     Comment:     Calculated AGAP = Na - (CL + CO2)  Interpret with caution; calculated AGAP may not  reflect patient's true clinical status.  This is a calculated value and platform-dependent.  A value >12.0 has been recommended for the management of  Hyperglycemic Crises: Diabetic Ketoacidosis and Hyperglycemic  Hyperosmolar State. Med Clin [place]. 2017;101(3):587-606.  doi:10.1016/j.mcna.2016.12.011         Lipid Panel   Cholesterol   Date/Time Value Ref Range Status   08/03/2020 09:37 AM 96 0 - 199 mg/dL Final     Triglycerides   Date/Time Value Ref Range Status   08/03/2020 09:37 AM 36 34 - 149 mg/dL Final     HDL   Date/Time Value Ref Range Status   08/03/2020 09:37 AM 32 (L) 40 - 9,999 mg/dL Final     Comment:     An HDL cholesterol <40 mg/dL is low and constitutes a  coronary heart disease risk factor, and HDL-C>59 mg/dL is  a negative risk factor for CHD.  Ref: American Heart Association; Circulation 2004         Coags:  PT   Date/Time Value Ref Range Status   08/15/2017 12:37 PM 14.2 12.6 - 15.0 sec Final     PT INR   Date/Time Value Ref Range Status   08/15/2017 12:37 PM 1.1 0.9 - 1.1 Final     Comment:     Recommended Ranges for Protime INR:    2.0-3.0 for most medical and surgical thromboembolic states    2.5-3.5 for artificial heart valves  INR result may not represent exact Warfarin dosing level during  the transition period from Heparin to Warfarin therapy.  Results should be interpreted based on  current anticoagulant  therapy and patient's clinical presentation.       PTT   Date/Time Value Ref Range Status   08/14/2017 08:39 AM 27 23 - 37 sec Final     Comment:     In vivo therapeutic range of heparin (0.3 - 0.7 IU/mL)  correlate with the following APTT times: 64 - 102 seconds.  Results should be interpreted based on current anticoagulant  therapy and patient's clinical presentation.           Diagnostic Imaging     I have reviewed and interpreted the vascular diagnostic imaging with the patient    Assessment and Plan       1. Migraine without aura and without status migrainosus, not intractable        2. Carotid stenosis, asymptomatic, right        Tracy [name] is a 57 y.o. female who presents in office for vascular consultation of carotid artery disorder as referred by neurology FNP Antoine Poche. Patient was previously seen by Dr. 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 A 07/09/22 MRI brain WO contrast study performed at [facility] for new or worsening headaches revealed no acute intracranial abnormality. Stable exam when compared to prior brain MRI on 07/12/2021. A few foci of T2/FLAIR prolongation in the subcortical and periventricular white matter are nonspecific. Statistically, these are related to chronic small vessel ischemic changes. Other considerations include chronic headache/migraine, prior infection/inflammation or vasculitis. Demyelinating process remains in the differential in the appropriate clinical setting. A 06/29/2021 CTA head and neck study performed at [facility] revealed no significant carotid or vertebral artery stenosis in the neck, focal web-like stenosis proximal right internal carotid artery. Possible dissection or web.   My impression is that her symptoms which are very nonspecific do not appear to be lateralizing symptoms and secondary to carotid disease the duplex ultrasound reveals widely patent bilateral carotid  arteries.  Therefore no vascular intervention is needed and I have spoken to her regarding importance of keeping a normal blood pressure and low LDL, since she is not a smoker or has history of diabetes.  This was discussed with the patient in detail and I will be seeing her in future as needed.    Revonda Standard, I would like to thank you for allowing me to participate in the care of this patient.    Best regards    Romelle Starcher, MD, FACS, RPVI  [facility]  Chief, Section of Vascular Surgery  [facility]      This note was generated by the Epic EMR system/ Dragon speech recognition and may contain inherent errors or omissions not intended by the user. Grammatical errors, random word insertions, deletions, pronoun errors and incomplete sentences are occasional consequences of this technology due to software limitations. Not all errors are caught or corrected.  If there are questions or concerns about the content of this note or information contained within the body of this dictation they should be addressed directly with the author for clarification

## 2022-08-26 ENCOUNTER — Telehealth (INDEPENDENT_AMBULATORY_CARE_PROVIDER_SITE_OTHER): Payer: Medicaid Other | Admitting: Family Medicine

## 2022-08-26 ENCOUNTER — Encounter (INDEPENDENT_AMBULATORY_CARE_PROVIDER_SITE_OTHER): Payer: Self-pay | Admitting: Family Medicine

## 2022-08-26 DIAGNOSIS — F800 Phonological disorder: Secondary | ICD-10-CM

## 2022-08-26 DIAGNOSIS — R4789 Other speech disturbances: Secondary | ICD-10-CM

## 2022-08-26 DIAGNOSIS — R413 Other amnesia: Secondary | ICD-10-CM

## 2022-08-26 DIAGNOSIS — E890 Postprocedural hypothyroidism: Secondary | ICD-10-CM

## 2022-08-26 DIAGNOSIS — R9082 White matter disease, unspecified: Secondary | ICD-10-CM

## 2022-08-26 NOTE — Progress Notes (Signed)
Subjective:    Date: 08/26/2022 11:42 AM   Patient ID: Tracy [name] is a 57 y.o. female.    This visit is being conducted via video and audio.    Verbal consent has been obtained from the patient to conduct a video and audio visit.    Patient confirms she is in the state of [place] at the time of this visit    Chief Complaint   Patient presents with    Headache     Follow up to MRI 07/09/22.  Updating PCP on Neuro's recommendations     Headache       Foggy memory and difficulty getting words out.  States hard to pronounce words.     Lab Results   Component Value Date    CHOL 96 08/03/2020    CHOL 98 04/07/2019    CHOL 102 01/20/2018     Lab Results   Component Value Date    HDL 32 (L) 08/03/2020    HDL 32 (L) 04/07/2019    HDL 38 (L) 01/20/2018     Lab Results   Component Value Date    LDL 57 08/03/2020    LDL 59 04/07/2019    LDL 57 01/20/2018     Lab Results   Component Value Date    TRIG 36 08/03/2020    TRIG 34 04/07/2019    TRIG 37 01/20/2018     Lab Results   Component Value Date    TSH 0.20 (L) 07/23/2022    TSH 2.55 11/05/2021    TSH 0.80 12/19/2020    TSH 6.880 (H) 09/18/2020    TSH 7.19 (H) 08/03/2020    TSH 4.79 07/05/2019         The ASCVD Risk score (Arnett DK, et al., 2019) failed to calculate for the following reasons:    The valid total cholesterol range is 130 to 320 mg/dL    No problems updated.    Problem List[1]  Patient Care Team:  Jules Husbands, DO as PCP - General (Family Medicine)  Einar Grad, MD as Consulting Physician (Gastroenterology)  Zerita Boers, MD as Consulting Physician (Endocrinology, Diabetes and Metabolism)  Campbell Lerner, MD as Consulting Physician (Neurology)  Frankey Shown Gerome Apley, MD as Consulting Physician (Neurology)  Angelene Giovanni, MD as Consulting Physician (Vascular Surgery)  Royann Shivers, MD as Consulting Physician (Medical Oncology)    Immunization History   Administered Date(s) Administered    COVID-19 mRNA BIVALENT vaccine 12 years and above  [employer]) 30 mcg/0.3 mL 11/21/2020    COVID-19 mRNA MONOVALENT vaccine PRIMARY SERIES 12 years and above [employer]) 30 mcg/0.3 mL (DILUTE BEFORE USE) 04/16/2019, 05/14/2019    Pneumococcal polysaccharide vaccine, 23 valent 07/14/2019    Tdap (tetanus, diphtheria reduced, acellular pertussis), adsorbed vaccine 01/07/2011    Zoster (SHINGRIX) vaccine, recombinant 07/14/2019     Current Medications[2]  Allergies[3]    Past Medical History:   Diagnosis Date    Anemia     Arthritis     Carotid stenosis     Diarrhea 12/2016    Since I had my dual switch surgery    Fatty liver 2017    GERD (gastroesophageal reflux disease)     Hepatic cirrhosis     Hiatal hernia     Hyperthyroidism 2013    Thyroid removed    Sickle-cell anemia     Sleep apnea     not using CPAP machine    Vitamin D deficiency  Past Surgical History:   Procedure Laterality Date    CHOLECYSTECTOMY  12/2016    COLONOSCOPY, WITH BIOPSY N/A 01/27/2018    Procedure: COLONOSCOPY, BIOPSY;  Surgeon: Shanon Brow, MD;  Location: [facility];  Service: Gastroenterology;  Laterality: N/A;    DUODENAL SWITCH  12/2016    EGD, BIOPSY N/A 01/27/2018    Procedure: EGD, BIOPSY;  Surgeon: Shanon Brow, MD;  Location: [facility];  Service: Gastroenterology;  Laterality: N/A;    GASTRECTOMY  09/2013    HYSTERECTOMY  2015    Partial.    LAPAROSCOPIC REVISION SLEEVE GASTRECTOMY TO GASTRIC BYPASS      LAPAROSCOPIC, GASTRECTOMY SLEEVE      SALPINGO OOPHERECTOMY Bilateral     THYROIDECTOMY, TOTAL  2013       Family History   Problem Relation Age of Onset    Cancer Mother         hx ovarian    Hypertension Mother     Inflammatory bowel disease Mother     Heart failure Mother     Angina Mother     Migraines Mother     Asthma Father     Diabetes Father     Hyperlipidemia Father     Hypertension Father     Cancer Father         Currently has cancer    Crohn's disease Father     Asthma Sister     Diabetes Sister     Hyperlipidemia Sister     Hypertension Sister      Early death Sister     Cancer Brother         hx of stomach    Hyperlipidemia Brother     Hypertension Brother     Migraines Brother     Learning disabilities Brother         Deaf    Breast cancer Paternal Aunt         died of     Breast cancer Other         maternal cousin-- survior    Obesity Son        Social History[4]  The following portions of the patient's history were reviewed and updated as appropriate: allergies, current medications, past family history, past medical history, past social history, past surgical history and problem list.    Review of Systems   Neurological:  Positive for headaches.        Objective:   LMP  (LMP Unknown)   Wt Readings from Last 3 Encounters:   08/13/22 59 kg (130 lb)   07/23/22 59 kg (130 lb)   06/17/22 59 kg (130 lb)     Physical Exam  Constitutional:       General: She is not in acute distress.     Appearance: She is not ill-appearing.   Neurological:      Mental Status: She is alert and oriented to person, place, and time.   Psychiatric:         Attention and Perception: Attention normal.         Mood and Affect: Affect is tearful.         Behavior: Behavior normal.            Assessment/Plan:       1. Memory deficits  - Referral to Neuropsychology ([place]); Future    2. Word finding difficulty  - Referral to Neuropsychology ([place]); Future  - SLP eval and treat; Future  3. Impaired speech articulation  - Referral to Neuropsychology ([place]); Future  - SLP eval and treat; Future    4. White matter abnormality on MRI of brain  - Lipid Panel; Future  - Lipid Panel    5. Postsurgical hypothyroidism    Reviewed neurology consultation and MRI brain reports - white matter MRI since at least 2020  Reviewed vascular consultation - no significant carotid stenosis  Will refer for neuropsychologic testing and speech therapy evaluation   Check lipids  Advised pt to contact/follow-up with her endocrinologist regarding abnormal thyroid testing done by neurology      Follow-up after  neuropsychologic testing or sooner as needed    Jules Husbands, DO       [1]   Patient Active Problem List  Diagnosis    Migraine    History of hypertension    Abdominal cyst    Hiatal hernia    Hypokalemia    S/P gastric bypass    Hepatic steatosis    Postsurgical hypothyroidism    Tubular adenoma of colon    Allergic rhinitis    Sleep difficulties    Difficulty concentrating    Forgetfulness    Hx of migraine headaches    Occipital headache    Frontal headache    Hx of concussion    White matter abnormality on MRI of brain    Hx of sleep apnea    Irritable bowel syndrome with diarrhea    Chronic diarrhea    Preventative health care    Normocytic anemia    Carotid stenosis, asymptomatic, right   [2]   Current Outpatient Medications   Medication Sig Dispense Refill    Armour Thyroid 15 MG tablet Take 1 tablet by mouth once daily 90 tablet 0    [employer] Thyroid 60 MG tablet Take 1 tablet by mouth once daily 30 tablet 5    Ascorbic Acid (vitamin C) 250 MG tablet Take 1 tablet (250 mg) by mouth every other day 100 tablet 0    Beclomethasone Dipropionate (Qnasl) 80 MCG/ACT Aero Soln 2 sprays by Nasal route daily 3 each 1    Biotin 5 MG/ML Liquid Take by mouth daily         calcium carbonate 1500 (600 Ca) MG Tab tablet Take 1 tablet (600 mg) by mouth      cetirizine (ZyrTEC Allergy) 10 MG tablet Take 1 tablet (10 mg) by mouth daily 90 tablet 1    COPPER PO Take by mouth      DULoxetine (CYMBALTA) 20 MG capsule Take 1 capsule (20 mg) by mouth daily 30 capsule 2    ferrous gluconate (FERGON) 324 MG tablet Take 1 tablet (324 mg) by mouth every other day 100 tablet 0    montelukast (SINGULAIR) 10 MG tablet TAKE 1 TABLET(10 MG) BY MOUTH EVERY NIGHT 90 tablet 1    Multiple Vitamins-Minerals (MULTIVITAMIN WITH MINERALS) tablet Take 1 tablet by mouth daily      Multiple Vitamins-Minerals (ZINC PO) Take by mouth      Spacer/Aero-Holding Chambers (AEROCHAMBER Z-STAT PLUS/MEDIUM) inhaler Use as instructed 1 each 0    VITAMIN A PO  Take by mouth      vitamin D (CHOLECALCIFEROL) 25 MCG (1000 UT) tablet Take 1 tablet (1,000 Units) by mouth daily      vitamin D, ergocalciferol, (DRISDOL) 50000 UNIT Cap Take 1 capsule (50,000 Units) by mouth once a week 8 capsule 0    albuterol sulfate HFA (Proventil HFA) 108 (  90 Base) MCG/ACT inhaler Inhale 2 puffs into the lungs every 4 (four) hours as needed for Wheezing or Shortness of Breath 18 g 0    albuterol sulfate HFA (PROVENTIL) 108 (90 Base) MCG/ACT inhaler Inhale 2 puffs into the lungs every 4 (four) hours as needed for Wheezing or Shortness of Breath (cough) (Patient not taking: Reported on 08/26/2022) 1 Inhaler 0    Beclomethasone Dipropionate (Qnasl) 80 MCG/ACT Aero Soln 2 sprays by Nasal route daily 1 each 0    butalbital-acetaminophen-caffeine (FIORICET) 50-325-40 MG per tablet Take 1 tablet by mouth every 4 (four) hours as needed for Headaches (Patient not taking: Reported on 08/26/2022) 15 tablet 0    diclofenac Sodium (VOLTAREN) 1 % Gel topical gel Apply 4 g to affected area 4 times daily as needed.  Max: 16 g/joint/day.  Do not apply to broken, inflamed, or infected skin. (Patient not taking: Reported on 08/26/2022) 100 g 1    montelukast (SINGULAIR) 10 MG tablet Take 1 tablet (10 mg) by mouth nightly 30 tablet 0    SUMAtriptan (IMITREX) 50 MG tablet Take 1 tablet (50 mg) by mouth as needed (Patient not taking: Reported on 08/26/2022)       No current facility-administered medications for this visit.   [3]   Allergies  Allergen Reactions    Bee Pollen Swelling     "Bee Stings".Marland KitchenMarland KitchenMarland KitchenStuffy nose; "throat swelling"      Vicodin [Hydrocodone-Acetaminophen] Nausea And Vomiting   [4]   Social History  Socioeconomic History    Marital status: Significant Other   Tobacco Use    Smoking status: Never    Smokeless tobacco: Never    Tobacco comments:     None   Vaping Use    Vaping status: Never Used   Substance and Sexual Activity    Alcohol use: No    Drug use: Not Currently    Sexual activity: Not  Currently     Partners: Male     Birth control/protection: Post-menopausal, Rhythm   Other Topics Concern    Dietary supplements / vitamins Yes     Comment: Biotin B12 multi vitamins D3    Anesthesia problems No    Blood thinners No    Pregnant No    Future Children No    Number of Pregnancies? No    Number of children Yes     Comment: 1    Miscarriages / Abortions? No    Eats large amounts No    Excessive Sweets No    Skips meals No    Eats excessive starches No    Snacks or grazes No    Emotional eater No    Eats fried food No    Eats fast food No    Diet Center Yes    Doylene Bode No    [facility] Weight Loss No    Nutri-System No    Opti-Fast / Medi-Fast Yes    Overeaters Anonymous No    [facility] Yes    TOPS No    Weight Watchers Yes    Atkins Yes    Binging / Purging No    Calorie Counting Yes    Fasting Yes    High Protein Yes    Low Carb Yes    Low Fat No    [facility] Diet No    Slim Fast Yes    [facility] No    Stationary cycle or treadmill Yes    Gym/fitness Classes Yes  Home exercise/video Yes    Swimming Yes    Weight training Yes    Walking or running Yes    Hospitalization No    Hypnosis No    Physical therapy Yes    Psychological therapy Yes    Residential program No    Acutrim No    [place] No    Contrave No    Dexatrim Yes    Diethylpropion No    Fastin No    Fen - Phen Yes    Ionamin / Adipex No    Phentermine Yes    Qsymia No    Prozac Yes    Saxenda No    Topamax No    Wellbutrin No    Xenical (Orlistat, Alli) No    Other Med No    No impairment Yes    Walks with cane/crutch Yes    Requires a wheelchair No    Bedridden No    Are you currently being treated for depression? No    Do you snore? No    Are you receiving any medical or psychological services? No    Do you ever wake up at night gasping for breath? No    Do you have or have you been treated for an eating disorder? No    Anyone ever told you that you stop breathing while asleep? No    Do you exercise regularly? Yes    Have  you or family member ever have trouble with anesthesia? Yes     Social Determinants of Health     Financial Resource Strain: Patient Declined (08/26/2022)    Overall Financial Resource Strain (CARDIA)     Difficulty of Paying Living Expenses: Patient declined   Recent Concern: Financial Resource Strain - Medium Risk (06/16/2022)    Overall Financial Resource Strain (CARDIA)     Difficulty of Paying Living Expenses: Somewhat hard   Food Insecurity: Unknown (08/26/2022)    Hunger Vital Sign     Worried About Running Out of Food in the Last Year: Never true     Ran Out of Food in the Last Year: Patient declined   Transportation Needs: Patient Declined (08/26/2022)    PRAPARE - Therapist, art (Medical): Patient declined     Lack of Transportation (Non-Medical): Patient declined   Physical Activity: Unknown (08/26/2022)    Exercise Vital Sign     Days of Exercise per Week: Patient declined     Minutes of Exercise per Session: 50 min   Stress: Patient Declined (08/26/2022)    [employer] of Occupational Health - Occupational Stress Questionnaire     Feeling of Stress : Patient declined   Recent Concern: Stress - Stress Concern Present (06/16/2022)    [employer] of Occupational Health - Occupational Stress Questionnaire     Feeling of Stress : To some extent   Social Connections: Unknown (08/26/2022)    Social Connection and Isolation Panel [NHANES]     Frequency of Communication with Friends and Family: Patient declined     Frequency of Social Gatherings with Friends and Family: Once a week     Attends Religious Services: More than 4 times per year     Active Member of [employer] or Organizations: Patient declined     Attends Banker Meetings: Patient declined     Marital Status: Patient declined   Intimate Partner Violence: Patient Declined (08/26/2022)    Humiliation, Afraid, Rape, and Kick questionnaire  Fear of Current or Ex-Partner: Patient declined     Emotionally Abused:  Patient declined     Physically Abused: Patient declined     Sexually Abused: Patient declined   Housing Stability: Patient Declined (08/26/2022)    Housing Stability Vital Sign     Unable to Pay for Housing in the Last Year: Patient declined     Homeless in the Last Year: Patient declined   Recent Concern: Housing Stability - High Risk (06/16/2022)    Housing Stability Vital Sign     Unable to Pay for Housing in the Last Year: Yes     Number of Places Lived in the Last Year: 1     Unstable Housing in the Last Year: No

## 2022-08-26 NOTE — Progress Notes (Signed)
Have you seen any specialists/other providers since your last visit with [place]?    YES Neuro/Vascular    The patient was informed that the following HM items are still outstanding:   Health Maintenance Due   Topic Date Due    Statin Use  Never done    Advance Directive on File  Never done    Tetanus Ten-Year  01/06/2021    COVID-19 Vaccine (5 - 2023-24 season) 09/06/2021    Annual Exam  09/18/2021    DEPRESSION SCREENING  07/03/2022    INFLUENZA VACCINE  08/07/2022    MAMMOGRAM  10/03/2022

## 2022-10-03 ENCOUNTER — Ambulatory Visit: Payer: Medicaid Other | Attending: Neurology | Admitting: Neurology

## 2022-10-03 ENCOUNTER — Encounter: Payer: Self-pay | Admitting: Neurology

## 2022-10-03 VITALS — BP 145/89 | HR 50 | Temp 98.0°F | Resp 18 | Ht 62.0 in | Wt 137.0 lb

## 2022-10-03 DIAGNOSIS — G43009 Migraine without aura, not intractable, without status migrainosus: Secondary | ICD-10-CM | POA: Insufficient documentation

## 2022-10-03 DIAGNOSIS — I779 Disorder of arteries and arterioles, unspecified: Secondary | ICD-10-CM | POA: Insufficient documentation

## 2022-10-03 DIAGNOSIS — G478 Other sleep disorders: Secondary | ICD-10-CM | POA: Insufficient documentation

## 2022-10-03 DIAGNOSIS — M5481 Occipital neuralgia: Secondary | ICD-10-CM | POA: Insufficient documentation

## 2022-10-03 DIAGNOSIS — R9082 White matter disease, unspecified: Secondary | ICD-10-CM | POA: Insufficient documentation

## 2022-10-03 DIAGNOSIS — E559 Vitamin D deficiency, unspecified: Secondary | ICD-10-CM | POA: Insufficient documentation

## 2022-10-03 DIAGNOSIS — R419 Unspecified symptoms and signs involving cognitive functions and awareness: Secondary | ICD-10-CM | POA: Insufficient documentation

## 2022-10-03 NOTE — Progress Notes (Signed)
10/03/2022    Patient ID: Tracy [name]    Chief Complaint:    Chief Complaint   Patient presents with    Follow-up     History of Present Illness:  Tracy [name] is a 57 y.o. female who presents for follow up of headache, neck pain.  She was last seen by me on 08/20/2021.  At that I recommended decreasing Gabapentin, continuation of aspirin.  In the interim she saw NP Koutsandreas. She recommended starting Duloxetine, consideration of occipital nerve block, MRI brain, referral to the Memory team, labs, Vascular Surgery referral.     MRI brain with non-specific white matter changes.   Labs with vitamin D deficiency.   Saw Vascular Surgery on 07/23/2022.   Carotid duplex study was recommended.   This showed widely patent arteries bilaterally.   Continues to be concerned about short-term memory and word finding difficulties.   Saw Dr. Nechama Guard last month, who has referred her for Neuropsych testing.   States that she gets 6-7 hours of sleep.   Does not feel well-rested upon awakening.   Wakes up and then it's "go go" as she has several jobs and runs a [employer].   States she had a sleep test in past and it sounds like she was told she had a sleep deficit as she went into deep sleep quickly.   States her headaches have been improved/tolerable.   She is not currently taking Duloxetine.     Interval History 06/17/2022 (Koutsandreas):  She is no longer on Gabapentin due to SE: "made me feel off."   She reports daily pain lasting "all day" that is pulsatile and may peak in intensity and become functionally incapacitating.   She notes pain that originates in her right skull base/neck and travels up and radiates frontally over her right ear and to her right eye with associated ipsilateral lacrimation.   Triggers: touching back of head and walking.   She has not yet scheduled an appointment with vascular surgery.   She reports memory issues and "difficulty remembering events coming up."   She has trouble remembering  tasks at work and must write things down to remember.  She reports difficulty with verbal fluency.  She tells me that her Tracy [name] passed away when she was 57 yrs old due to dementia (?)     Interval History 08/20/2021:  She is still noticing stabbing headache pain 3-4 days per month.   Has been treating with Sumatriptan (from another provider).   Had some sedation on Gabapentin 300 mg qHS dose.   Has not been able to get appointment with her previous Vascular Surgeon.   Interested in another opinion.     Interval History 07/02/2021:  The headaches start in the occipital, sub occipital, and travels up and to the temporal area.   Says these are different than the migraines she has had in the past.   Pain starts usually on the right, and then encompasses the entire back of her head.  She describes the pain as severe. Pain be shooting or stabbing.  The area is sensitive to the touch.  At night the pain is more of a throbbing, pulsating. She has to sleep with a certain type of pillow for comfort as the entire back of head is painful  She denies photophobia, photophobia. Patient does report intermittent blurry vision of the right eye.  Patient's 30-day headache frequency/severity profile is at least 20 days/ several  The migraines she had in the  past were frontal and were associated with photophobia, photophobia.  Patient did not trial Ubrogepant due to cost  Over the weekend on 6/24 patient went to [facility] emergency room for severe headache.  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    Patient has an MRI scheduled for July 7 at [facility]  She has not seen vascular surgery in over a year, and  is interested in possible stenting if appropriate.    History Retained from Initial  05/17/2020 Visit:  Her home sleep study was normal.   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   She has a several year history of headaches.   States she had headaches in her 59s.   They went away and came back.   Pain starts in the back of her head and moves to the front.   Tension or tightness.   Can be on either side.   Awakens her from sleep frequently.   Can worsen into a pulsating headache that lasts all day.   Associated with generalized weakness/fatigue, light and sound sensitivity, nausea.   Can last all day.   She treats with Butalbital-Acetaminophen-Caffeine for acute relief.   Works if "she catches it in time".  Worse with stress.   Ranges from 5-10 days per month.   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   Was seen at [facility].   Recommended non-operative management.   Seen in ED for worsening headaches in 03/2020.   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   Seen by Vascular Surgery in late March.   Carotid duplex recommended.   States she is taking baby aspirin 81 mg.   She was evaluated by Dr. Peggye Pitt at [facility].   Follow up MRI was ordered.     Previous Preventive Medications:  Gabapentin  Topiramate    Previous Acute Medications:  Acetaminophen  Butalbital-Acetaminophen-Caffeine  Sumatriptan  Ubrogepant - prescribed but didn't try due to cost    Past Medical History:  Past Medical History:   Diagnosis Date    Anemia     Arthritis     Carotid stenosis     Diarrhea 12/2016    Since I had my dual switch surgery    Fatty liver 2017    GERD (gastroesophageal reflux disease)     Hepatic cirrhosis     Hiatal hernia     Hyperthyroidism 2013    Thyroid removed    Sickle-cell anemia     Sleep apnea     not using CPAP machine    Vitamin D deficiency      Past Surgical History:  Past Surgical History:   Procedure Laterality Date    CHOLECYSTECTOMY  12/2016    COLONOSCOPY, WITH  BIOPSY N/A 01/27/2018    Procedure: COLONOSCOPY, BIOPSY;  Surgeon: Shanon Brow, MD;  Location: [facility];  Service: Gastroenterology;  Laterality: N/A;    DUODENAL SWITCH  12/2016    EGD, BIOPSY N/A 01/27/2018    Procedure: EGD, BIOPSY;  Surgeon: Shanon Brow, MD;  Location: [facility];  Service: Gastroenterology;  Laterality: N/A;    GASTRECTOMY  09/2013    HYSTERECTOMY  2015  Partial.    LAPAROSCOPIC REVISION SLEEVE GASTRECTOMY TO GASTRIC BYPASS      LAPAROSCOPIC, GASTRECTOMY SLEEVE      SALPINGO OOPHERECTOMY Bilateral     THYROIDECTOMY, TOTAL  2013     Allergies:  Bee pollen and Vicodin [hydrocodone-acetaminophen]    Family History:  Family History[1]  No other family history related to current complaints.    Social History:  Social History     Tobacco Use    Smoking status: Never    Smokeless tobacco: Never    Tobacco comments:     None   Vaping Use    Vaping status: Never Used   Substance Use Topics    Alcohol use: No    Drug use: Not Currently     Medications:  Current Outpatient Medications   Medication Sig Dispense Refill    albuterol sulfate HFA (Proventil HFA) 108 (90 Base) MCG/ACT inhaler Inhale 2 puffs into the lungs every 4 (four) hours as needed for Wheezing or Shortness of Breath 18 g 0    albuterol sulfate HFA (PROVENTIL) 108 (90 Base) MCG/ACT inhaler Inhale 2 puffs into the lungs every 4 (four) hours as needed for Wheezing or Shortness of Breath (cough) 1 Inhaler 0    Armour Thyroid 15 MG tablet Take 1 tablet by mouth once daily 90 tablet 0    Armour Thyroid 60 MG tablet Take 1 tablet by mouth once daily 30 tablet 5    Ascorbic Acid (vitamin C) 250 MG tablet Take 1 tablet (250 mg) by mouth every other day 100 tablet 0    Beclomethasone Dipropionate (Qnasl) 80 MCG/ACT Aero Soln 2 sprays by Nasal route daily (Patient taking differently: 2 sprays by Nasal route as needed) 1 each 0    Beclomethasone Dipropionate (Qnasl) 80 MCG/ACT Aero Soln 2 sprays by Nasal route daily  (Patient taking differently: 2 sprays by Nasal route as needed) 3 each 1    Biotin 5 MG/ML Liquid Take by mouth daily         butalbital-acetaminophen-caffeine (FIORICET) 50-325-40 MG per tablet Take 1 tablet by mouth every 4 (four) hours as needed for Headaches 15 tablet 0    calcium carbonate 1500 (600 Ca) MG Tab tablet Take 1 tablet (600 mg) by mouth      cetirizine (ZyrTEC Allergy) 10 MG tablet Take 1 tablet (10 mg) by mouth daily 90 tablet 1    COPPER PO Take by mouth      diclofenac Sodium (VOLTAREN) 1 % Gel topical gel Apply 4 g to affected area 4 times daily as needed.  Max: 16 g/joint/day.  Do not apply to broken, inflamed, or infected skin. 100 g 1    DULoxetine (CYMBALTA) 20 MG capsule Take 1 capsule (20 mg) by mouth daily 30 capsule 2    ferrous gluconate (FERGON) 324 MG tablet Take 1 tablet (324 mg) by mouth every other day 100 tablet 0    montelukast (SINGULAIR) 10 MG tablet TAKE 1 TABLET(10 MG) BY MOUTH EVERY NIGHT 90 tablet 1    montelukast (SINGULAIR) 10 MG tablet Take 1 tablet (10 mg) by mouth nightly (Patient taking differently: Take 1 tablet (10 mg) by mouth as needed) 30 tablet 0    Multiple Vitamins-Minerals (MULTIVITAMIN WITH MINERALS) tablet Take 1 tablet by mouth daily      Multiple Vitamins-Minerals (ZINC PO) Take by mouth      Spacer/Aero-Holding Chambers (AEROCHAMBER Z-STAT PLUS/MEDIUM) inhaler Use as instructed 1 each 0    VITAMIN A PO  Take by mouth      vitamin D (CHOLECALCIFEROL) 25 MCG (1000 UT) tablet Take 1 tablet (1,000 Units) by mouth daily      vitamin D, ergocalciferol, (DRISDOL) 50000 UNIT Cap Take 1 capsule (50,000 Units) by mouth once a week 8 capsule 0    SUMAtriptan (IMITREX) 50 MG tablet Take 1 tablet (50 mg) by mouth as needed (Patient not taking: Reported on 08/26/2022)       No current facility-administered medications for this visit.     General Exam:  BP 145/89 (BP Site: Left arm, Patient Position: Sitting, Cuff Size: Medium)   Pulse (!) 50   Temp 98 F (36.7 C)  (Oral)   Resp 18   Ht 1.575 m (5\' 2" )   Wt 62.1 kg (137 lb)   LMP  (LMP Unknown)   SpO2 96%   BMI 25.06 kg/m   Neuro Exam:    MENTAL STATUS:  Awake, alert, oriented.  Attentive.  Follows commands.  Speech fluent, non-dysarthric, with normal naming.   CRANIAL NERVES:  EOMI.  Face symmetric.  Hearing grossly intact.  Shoulders symmetric.   MOTOR:  Moves all extremities symmetrically antigravity.  No adventitious movements.   COORDINATION:  No dysmetria.   GAIT:  Normal gait and station.     Investigations:  Labs Reviewed:  Component      Latest Ref Rng 07/23/2022   Vitamin D, 25-OH, Total      30 - 100 ng/mL 20 (L)    TSH      0.35 - 4.94 uIU/mL 0.20 (L)    HOMOCYSTEINE      5.08 - 15.39 umol/L 13.22    Methylmalonic Acid      55 - 335 nmol/L 127    Vitamin B-12      211 - 911 pg/mL 600    T4      4.9 - 11.7 ug/dL 5.7       Legend:  (L) Low    Lab Results   Component Value Date    WBC 3.05 (L) 11/05/2021    HGB 9.6 (L) 11/05/2021    HCT 31.8 (L) 11/05/2021    MCV 82.6 11/05/2021    PLT 232 11/05/2021     '  Chemistry        Component Value Date/Time    NA 143 11/05/2021 1142    K 3.7 11/05/2021 1142    CL 111 11/05/2021 1142    CO2 24 11/05/2021 1142    BUN 12.0 11/05/2021 1142    CREAT 0.8 11/05/2021 1142    GLU 83 11/05/2021 1142        Component Value Date/Time    CA 8.6 11/05/2021 1142    ALKPHOS 145 (H) 11/05/2021 1142    AST 162 (H) 11/05/2021 1142    ALT 117 (H) 11/05/2021 1142    BILITOTAL 0.6 11/05/2021 1142        Imaging:  US Carotid Duplex Dopp 07/29/2022:  Narrative & Impression  Conclusions: No evidence of plaque visualized in the bilateral internal carotid, external carotid, vertebral and subclavian arteries.   Bilateral vertebral arteries appear patent with antegrade flow.      MRI Brain Without Contrast 07/16/2022:  IMPRESSION:   1.No acute intracranial abnormality. Stable exam when compared to prior  brain MRI on 07/12/2021.  2.A few foci of T2/FLAIR prolongation in the subcortical  and  periventricular white matter are nonspecific. Statistically, these are  related to chronic small vessel ischemic changes. Other  considerations  include chronic headache/migraine, prior infection/inflammation or  vasculitis. Demyelinating process remains in the differential in the  appropriate clinical setting.    MRI Brain With and Without Contrast 07/12/2021:  IMPRESSION:   No new abnormality seen. Stable size of small nonspecific focus  of white matter change in the right cerebral hemisphere.     CTA Head/Neck 06/29/2021:  IMPRESSION:   1. Normal CT angio head. No intracranial large vessel occlusion or   stenosis   2. No significant carotid or vertebral artery stenosis in the neck   3. 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   Possible dissection or web.     Other Studies:  Sleep Study 08/18/2018:  IMPRESSION:  Overall, normal-appearing home sleep study without evidence for significant  sleep disturbed-breathing or sleep apnea.  Minimal primary snoring was  seen.    Records Reviewed:  Progress notes by Dr. Nechama Guard from 08/26/2022  Vascular Surgery notes 08/13/2022    Impression:  Janaia Kozel is a 57 y.o. female who presents for follow up of headaches, carotid artery irregularity, memory concerns.  Patient has a long standing history of migraine.      MRI brain with non-specific white matter changes. Findings were stable with no progress between 2023 and 2024 scans.  Clinically, similar changes are common in patients in migraine and may also be secondary to small vessel ischemic changes.      She continues to have concern regarding her memory.  Of notes, she endorses signs of significant sleep deficit/non-restorative sleep.     Recommendations:  1. Occipital neuralgia  2. Migraine without aura and without status migrainosus, not intractable  -Stable off of Duloxetine at this time  -Would avoid triptans and DHE due to vascular lesion  -Patient was counseled via AVS to follow the SEEDS  for success in headache management, including Sleep hygiene, Exercising regularly, Eating healthy and regular meals, Drinking water, keeping a headache Diary, and Stress reduction.  -Patient was counseled via AVS on the risk of acute medication overuse headache and was advised to limit acute medication use to no more than 3 days per week    3. White matter abnormality on MRI of brain  -Stable across serial scans  -May be secondary to migraine and/or small vessel ischemic disease  -Recommend continued optimization of vascular risk factors    4. Carotid artery disorder  -Also stable across serial scans  -No significant stenosis noted on carotid doppler    5. Cognitive complaints  6. Non-restorative sleep  -We discussed importance of quality sleep for memory function  -Sleep hygiene tips reviewed  -PCP has referred for Neuropsych testing    7. Vitamin D deficiency  -Continue supplementation    Follow up after Neuropsych testing for continued longitudinal management of chronic disorders.     Adonis Huguenin, MD  [facility]  Board Certified in Adult Neurology by the American Board of Psychiatry and Neurology (ABPN)  Board Certified in Headache Medicine by the [employer] (UCNS)  10/03/2022                       [1]   Family History  Problem Relation Name Age of Onset    Cancer Mother RUMAYSA SABATINO         hx ovarian    Hypertension Mother FREDDIE NGHIEM     Inflammatory bowel disease Mother BRIYAH WHEELWRIGHT     Heart failure Mother  Carmela Hurt     Angina Mother ALAISA MOFFITT     Migraines Mother Carmela Hurt     Asthma Father Father     Diabetes Father Father     Hyperlipidemia Father Father     Hypertension Father Father     Cancer Father Father         Currently has cancer    Crohn's disease Father Father     Asthma Tracy [name] IYESHA SUCH     Diabetes Tracy [name] HUMAIRA SCULLEY     Hyperlipidemia Tracy [name] AMEIRA ALESSANDRINI     Hypertension Tracy [name] Carmela Hurt     Early death Tracy [name] OSIRIS CHARLES      Cancer Brother Carmela Hurt         hx of stomach    Hyperlipidemia Brother DELITHA ELMS     Hypertension Brother Carmela Hurt     Migraines Brother Carmela Hurt     Learning disabilities Brother Carmela Hurt         Deaf    Breast cancer Paternal Aunt          died of     Breast cancer Other cousin         maternal cousin-- survior    Obesity Son KC SEDLAK

## 2022-10-03 NOTE — Patient Instructions (Addendum)
-  Follow the SEEDS for success in headache management, including Sleep hygiene, Exercising regularly, Eating healthy and regular meals, Drinking water, keeping a headache Diary, and Stress reduction.    -To schedule your Neuropsychological Testing, please call Lawrence Marseilles, who schedules the testing with Dr. Consuella Lose at the [facility] office.  Her number is [phone].    -Other options for Neuropsychological Testing include:  [facility]  [address]  [place], [place] [address]  [phone]    [facility]  [address]  [place], [place] [address]  [phone]  (Does not accept Medicaid, [place], [place], or [facility])    -Practice good sleep hygiene:  Maintain a regular sleep schedule which incorporates 8-9 hours of sleep each night.   Avoid caffeine, alcohol, and spicy/sugary foods 4-6 hours prior to bedtime.   Avoid excessive napping during the day.   Avoid stressful activities before bedtime and engage in a relaxing bedtime routine.   Do not work, use the computer, use your cell phone, or watch TV while in bed.   Ensure that the bedroom is dark, quiet, and at a comfortable temperature.     -Cognitive Behavioral Therapy for insomnia (CBT-i) can be very helpful.      -There are several resources for this online    -One option is www.cbtforinsomnia.com    -Another is [email or web address]    -Follow up after Neuropsychology testing

## 2022-11-14 ENCOUNTER — Other Ambulatory Visit (INDEPENDENT_AMBULATORY_CARE_PROVIDER_SITE_OTHER): Payer: Self-pay | Admitting: "Endocrinology

## 2022-11-14 DIAGNOSIS — E890 Postprocedural hypothyroidism: Secondary | ICD-10-CM

## 2022-11-15 NOTE — Telephone Encounter (Signed)
 Rx refill(s) sent.

## 2022-12-03 ENCOUNTER — Ambulatory Visit (INDEPENDENT_AMBULATORY_CARE_PROVIDER_SITE_OTHER): Payer: Medicaid Other | Admitting: Student in an Organized Health Care Education/Training Program

## 2022-12-03 ENCOUNTER — Encounter (INDEPENDENT_AMBULATORY_CARE_PROVIDER_SITE_OTHER): Payer: Self-pay | Admitting: Student in an Organized Health Care Education/Training Program

## 2022-12-03 VITALS — BP 163/92 | HR 55 | Temp 97.3°F | Ht 62.0 in | Wt 138.0 lb

## 2022-12-03 DIAGNOSIS — J329 Chronic sinusitis, unspecified: Secondary | ICD-10-CM

## 2022-12-03 DIAGNOSIS — E039 Hypothyroidism, unspecified: Secondary | ICD-10-CM

## 2022-12-03 DIAGNOSIS — R053 Chronic cough: Secondary | ICD-10-CM

## 2022-12-03 DIAGNOSIS — J309 Allergic rhinitis, unspecified: Secondary | ICD-10-CM

## 2022-12-03 MED ORDER — QNASL 80 MCG/ACT NA AERS
2.0000 | INHALATION_SPRAY | NASAL | 3 refills | Status: AC | PRN
Start: 2022-12-03 — End: 2023-04-09

## 2022-12-03 MED ORDER — AMOXICILLIN-POT CLAVULANATE 875-125 MG PO TABS
1.0000 | ORAL_TABLET | Freq: Two times a day (BID) | ORAL | 0 refills | Status: AC
Start: 2022-12-03 — End: 2022-12-13

## 2022-12-03 MED ORDER — ALBUTEROL SULFATE HFA 108 (90 BASE) MCG/ACT IN AERS
2.0000 | INHALATION_SPRAY | RESPIRATORY_TRACT | 0 refills | Status: DC | PRN
Start: 2022-12-03 — End: 2023-04-07

## 2022-12-03 NOTE — Progress Notes (Signed)
[facility] PRIMARY CARE  [facility]  PROGRESS NOTE      Patient: Tracy [name]   Date: 12/03/2022   MRN: [id number]   DOB: [birth year]     ASSESSMENT/PLAN     Darreld Mclean Desira Litton is a 57 y.o. female with:    1. Cough  - albuterol sulfate HFA (PROVENTIL) 108 (90 Base) MCG/ACT inhaler; Inhale 2 puffs into the lungs every 4 (four) hours as needed for Wheezing or Shortness of Breath (cough)  Dispense: 1 each; Refill: 0  - amoxicillin-clavulanate (AUGMENTIN) 875-125 MG per tablet; Take 1 tablet by mouth 2 (two) times daily for 10 days  Dispense: 20 tablet; Refill: 0    2. Allergic rhinitis, unspecified seasonality, unspecified trigger  - Beclomethasone Dipropionate (Qnasl) 80 MCG/ACT Aero Soln; 2 sprays by Nasal route as needed (sinus congestion)  Dispense: 10.6 g; Refill: 3    3. Hypothyroidism, unspecified type  - TSH; Future  - T4, Free; Future  - TSH  - T4, Free    4. Sinusitis, unspecified chronicity, unspecified location  - albuterol sulfate HFA (PROVENTIL) 108 (90 Base) MCG/ACT inhaler; Inhale 2 puffs into the lungs every 4 (four) hours as needed for Wheezing or Shortness of Breath (cough)  Dispense: 1 each; Refill: 0  - Beclomethasone Dipropionate (Qnasl) 80 MCG/ACT Aero Soln; 2 sprays by Nasal route as needed (sinus congestion)  Dispense: 10.6 g; Refill: 3  - amoxicillin-clavulanate (AUGMENTIN) 875-125 MG per tablet; Take 1 tablet by mouth 2 (two) times daily for 10 days  Dispense: 20 tablet; Refill: 0       Return if symptoms worsen or fail to improve.    MEDICATIONS     Current Medications[1]    Allergies[2]    SUBJECTIVE     Chief Complaint   Patient presents with    URI     Cough for approx 1 week         This is a 57 y.o. female with:    URI  -reporting nasal congestion, stuffiness, sneezing, sore throat, irritated throat, cough-dry, shortness of breath (uses albuterol inhaler as needed at home), mild headache, sinus pressure and pain, feeling tired for about a week. Hx of sinus infections.      Hypertension  -usually normal at home.     Feels cold. She is on iron infusions. Gets tired very easily. Had sleep study. Was on cpap years ago, but recent study stated she does not need it. No snoring. Admits she does not have good sleep hygiene. States sometimes she is awake for 24-48 hours. 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     Wants tsh checked. On armour 60 and 15- total 75 mg.     Medical History[3]    Social History[4]    Past Surgical History[5]    Family History[6]      ROS     ROS as stated in HPI     The following sections were reviewed this encounter by the provider:   Tobacco  Allergies  Meds  Problems  Med Hx  Surg Hx  Fam Hx         OBJECTIVE/PHYSICAL EXAM     Vitals:    12/03/22 1003 12/03/22 1006   BP: 165/82 (!) 163/92   Pulse: (!) 55 (!) 55   Temp: 97.3 F (36.3 C)    Weight: 62.6 kg (138 lb)    Height: 1.575 m (5\' 2" )      Body mass index is 25.24 kg/m.  No LMP recorded (lmp unknown). Patient has had a hysterectomy.      Physical Exam  Constitutional:       Appearance: Normal appearance.   HENT:      Head: Normocephalic and atraumatic.      Right Ear: External ear normal.      Left Ear: External ear normal.      Nose: Nose normal.      Mouth/Throat:      Pharynx: No posterior oropharyngeal erythema.   Cardiovascular:      Rate and Rhythm: Normal rate and regular rhythm.   Pulmonary:      Effort: Pulmonary effort is normal. No respiratory distress.   Musculoskeletal:         General: Normal range of motion.   Neurological:      General: No focal deficit present.      Mental Status: She is alert.   Psychiatric:         Mood and Affect: Mood normal.           Signed,  Edison Simon, DO    This note was generated by the Epic EMR system/ Dragon speech recognition and may contain inherent errors or omissions not intended by the user. Grammatical errors, random word insertions, deletions and pronoun errors  are occasional consequences of this technology due to software limitations.  Not all errors are caught or corrected. If there are questions or concerns about the content of this note or information contained within the body of this dictation they should be addressed directly with the author for clarification.         [1]   Current Outpatient Medications   Medication Sig Dispense Refill    Armour Thyroid 15 MG tablet TAKE 1 TABLET BY MOUTH ONCE DAILY . APPOINTMENT REQUIRED FOR FUTURE REFILLS 90 tablet 0    Armour Thyroid 60 MG tablet Take 1 tablet by mouth once daily 30 tablet 5    Ascorbic Acid (vitamin C) 250 MG tablet Take 1 tablet (250 mg) by mouth every other day 100 tablet 0    Beclomethasone Dipropionate (Qnasl) 80 MCG/ACT Aero Soln 2 sprays by Nasal route daily (Patient taking differently: 2 sprays by Nasal route as needed) 3 each 1    Biotin 5 MG/ML Liquid Take by mouth daily         butalbital-acetaminophen-caffeine (FIORICET) 50-325-40 MG per tablet Take 1 tablet by mouth every 4 (four) hours as needed for Headaches 15 tablet 0    calcium carbonate 1500 (600 Ca) MG Tab tablet Take 1 tablet (600 mg) by mouth      cetirizine (ZyrTEC Allergy) 10 MG tablet Take 1 tablet (10 mg) by mouth daily 90 tablet 1    COPPER PO Take by mouth      diclofenac Sodium (VOLTAREN) 1 % Gel topical gel Apply 4 g to affected area 4 times daily as needed.  Max: 16 g/joint/day.  Do not apply to broken, inflamed, or infected skin. 100 g 1    DULoxetine (CYMBALTA) 20 MG capsule Take 1 capsule (20 mg) by mouth daily 30 capsule 2    ferrous gluconate (FERGON) 324 MG tablet Take 1 tablet (324 mg) by mouth every other day 100 tablet 0    montelukast (SINGULAIR) 10 MG tablet TAKE 1 TABLET(10 MG) BY MOUTH EVERY NIGHT 90 tablet 1    Multiple Vitamins-Minerals (MULTIVITAMIN WITH MINERALS) tablet Take 1 tablet by mouth daily      Multiple Vitamins-Minerals (ZINC PO)  Take by mouth      Spacer/Aero-Holding Chambers (AEROCHAMBER Z-STAT PLUS/MEDIUM) inhaler Use as instructed 1 each 0    SUMAtriptan (IMITREX) 50 MG tablet  Take 1 tablet (50 mg) by mouth as needed      VITAMIN A PO Take by mouth      vitamin D (CHOLECALCIFEROL) 25 MCG (1000 UT) tablet Take 1 tablet (1,000 Units) by mouth daily      vitamin D, ergocalciferol, (DRISDOL) 50000 UNIT Cap Take 1 capsule (50,000 Units) by mouth once a week 8 capsule 0    albuterol sulfate HFA (Proventil HFA) 108 (90 Base) MCG/ACT inhaler Inhale 2 puffs into the lungs every 4 (four) hours as needed for Wheezing or Shortness of Breath 18 g 0    albuterol sulfate HFA (PROVENTIL) 108 (90 Base) MCG/ACT inhaler Inhale 2 puffs into the lungs every 4 (four) hours as needed for Wheezing or Shortness of Breath (cough) 1 each 0    amoxicillin-clavulanate (AUGMENTIN) 875-125 MG per tablet Take 1 tablet by mouth 2 (two) times daily for 10 days 20 tablet 0    Beclomethasone Dipropionate (Qnasl) 80 MCG/ACT Aero Soln 2 sprays by Nasal route as needed (sinus congestion) 10.6 g 3    montelukast (SINGULAIR) 10 MG tablet Take 1 tablet (10 mg) by mouth nightly (Patient taking differently: Take 1 tablet (10 mg) by mouth as needed) 30 tablet 0     No current facility-administered medications for this visit.   [2]   Allergies  Allergen Reactions    Bee Pollen Swelling     "Bee Stings".Marland KitchenMarland KitchenMarland KitchenStuffy nose; "throat swelling"      Vicodin [Hydrocodone-Acetaminophen] Nausea And Vomiting   [3]   Past Medical History:  Diagnosis Date    Anemia     Arthritis     Carotid stenosis     Diarrhea 12/2016    Since I had my dual switch surgery    Fatty liver 2017    GERD (gastroesophageal reflux disease)     Hepatic cirrhosis     Hiatal hernia     Hyperthyroidism 2013    Thyroid removed    Sickle-cell anemia     Sleep apnea     not using CPAP machine    Vitamin D deficiency    [4]   Social History  Socioeconomic History    Marital status: Significant Other   Tobacco Use    Smoking status: Never    Smokeless tobacco: Never    Tobacco comments:     None   Vaping Use    Vaping status: Never Used   Substance and Sexual Activity    Alcohol  use: No    Drug use: Not Currently    Sexual activity: Not Currently     Partners: Male     Birth control/protection: Post-menopausal, Rhythm   Other Topics Concern    Dietary supplements / vitamins Yes     Comment: Biotin B12 multi vitamins D3    Anesthesia problems No    Blood thinners No    Pregnant No    Future Children No    Number of Pregnancies? No    Number of children Yes     Comment: 1    Miscarriages / Abortions? No    Eats large amounts No    Excessive Sweets No    Skips meals No    Eats excessive starches No    Snacks or grazes No    Emotional eater No    Eats  fried food No    Eats fast food No    Diet Center Yes    Doylene Bode No    LA Weight Loss No    Nutri-System No    Opti-Fast / Medi-Fast Yes    Overeaters Anonymous No    [facility] Yes    TOPS No    Weight Watchers Yes    Atkins Yes    Binging / Purging No    Calorie Counting Yes    Fasting Yes    High Protein Yes    Low Carb Yes    Low Fat No    [facility] Diet No    Slim Fast Yes    [place] No    Stationary cycle or treadmill Yes    Gym/fitness Classes Yes    Home exercise/video Yes    Swimming Yes    Weight training Yes    Walking or running Yes    Hospitalization No    Hypnosis No    Physical therapy Yes    Psychological therapy Yes    Residential program No    Acutrim No    [place] No    Contrave No    Dexatrim Yes    Diethylpropion No    Fastin No    Fen - Phen Yes    Ionamin / Adipex No    Phentermine Yes    Qsymia No    Prozac Yes    Saxenda No    Topamax No    Wellbutrin No    Xenical (Orlistat, Alli) No    Other Med No    No impairment Yes    Walks with cane/crutch Yes    Requires a wheelchair No    Bedridden No    Are you currently being treated for depression? No    Do you snore? No    Are you receiving any medical or psychological services? No    Do you ever wake up at night gasping for breath? No    Do you have or have you been treated for an eating disorder? No    Anyone ever told you that you stop breathing  while asleep? No    Do you exercise regularly? Yes    Have you or family member ever have trouble with anesthesia? Yes     Social Drivers of Health     Financial Resource Strain: Patient Declined (08/26/2022)    Overall Financial Resource Strain (CARDIA)     Difficulty of Paying Living Expenses: Patient declined   Recent Concern: Financial Resource Strain - Medium Risk (06/16/2022)    Overall Financial Resource Strain (CARDIA)     Difficulty of Paying Living Expenses: Somewhat hard   Food Insecurity: Unknown (08/26/2022)    Hunger Vital Sign     Worried About Running Out of Food in the Last Year: Never true     Ran Out of Food in the Last Year: Patient declined   Transportation Needs: Patient Declined (08/26/2022)    PRAPARE - Therapist, art (Medical): Patient declined     Lack of Transportation (Non-Medical): Patient declined   Physical Activity: Unknown (08/26/2022)    Exercise Vital Sign     Days of Exercise per Week: Patient declined     Minutes of Exercise per Session: 50 min   Stress: Patient Declined (08/26/2022)    [employer] of Occupational Health - Occupational Stress Questionnaire     Feeling  of Stress : Patient declined   Recent Concern: Stress - Stress Concern Present (06/16/2022)    [employer] of Occupational Health - Occupational Stress Questionnaire     Feeling of Stress : To some extent   Social Connections: Unknown (08/26/2022)    Social Connection and Isolation Panel [NHANES]     Frequency of Communication with Friends and Family: Patient declined     Frequency of Social Gatherings with Friends and Family: Once a week     Attends Religious Services: More than 4 times per year     Active Member of [employer] or Organizations: Patient declined     Attends Banker Meetings: Patient declined     Marital Status: Patient declined   Intimate Partner Violence: Patient Declined (08/26/2022)    Humiliation, Afraid, Rape, and Kick questionnaire     Fear of Current or  Ex-Partner: Patient declined     Emotionally Abused: Patient declined     Physically Abused: Patient declined     Sexually Abused: Patient declined   Housing Stability: Patient Declined (08/26/2022)    Housing Stability Vital Sign     Unable to Pay for Housing in the Last Year: Patient declined     Homeless in the Last Year: Patient declined   Recent Concern: Housing Stability - High Risk (06/16/2022)    Housing Stability Vital Sign     Unable to Pay for Housing in the Last Year: Yes     Number of Places Lived in the Last Year: 1     Unstable Housing in the Last Year: No   [5]   Past Surgical History:  Procedure Laterality Date    CHOLECYSTECTOMY  12/2016    COLONOSCOPY, WITH BIOPSY N/A 01/27/2018    Procedure: COLONOSCOPY, BIOPSY;  Surgeon: Shanon Brow, MD;  Location: [facility];  Service: Gastroenterology;  Laterality: N/A;    DUODENAL SWITCH  12/2016    EGD, BIOPSY N/A 01/27/2018    Procedure: EGD, BIOPSY;  Surgeon: Shanon Brow, MD;  Location: [facility];  Service: Gastroenterology;  Laterality: N/A;    GASTRECTOMY  09/2013    HYSTERECTOMY  2015    Partial.    LAPAROSCOPIC REVISION SLEEVE GASTRECTOMY TO GASTRIC BYPASS      LAPAROSCOPIC, GASTRECTOMY SLEEVE      SALPINGO OOPHERECTOMY Bilateral     THYROIDECTOMY, TOTAL  2013   [6]   Family History  Problem Relation Name Age of Onset    Cancer Mother MANINDER ESTEVE         hx ovarian    Hypertension Mother SHARAI YOH     Inflammatory bowel disease Mother TALI HAVENS     Heart failure Mother TZIPPY KETNER     Angina Mother WHITLEY DALPIAZ     Migraines Mother Carmela Hurt     Asthma Father Father     Diabetes Father Father     Hyperlipidemia Father Father     Hypertension Father Father     Cancer Father Father         Currently has cancer    Crohn's disease Father Father     Asthma Sister QUEENIE PIER     Diabetes Sister CYNCERE MOREA     Hyperlipidemia Sister AMEILIA HIRAKAWA     Hypertension Sister Carmela Hurt     Early death Sister Carmela Hurt     Cancer Brother Carmela Hurt  hx of stomach    Hyperlipidemia Brother ODALI ISMAIL     Hypertension Brother JOCELINE RINGLEY     Migraines Brother Carmela Hurt     Learning disabilities Brother Carmela Hurt         Deaf    Breast cancer Paternal Aunt          died of     Breast cancer Other cousin         maternal cousin-- survior    Obesity Son JODIE SABEL

## 2022-12-03 NOTE — Progress Notes (Signed)
Have you seen any specialists/other providers since your last visit with [place]?    Yes, oncology      The patient was informed that the following HM items are still outstanding:   Health Maintenance Due   Topic Date Due    Statin Use  Never done    Advance Directive on File  Never done    Tetanus Ten-Year  01/06/2021    Annual Exam  09/18/2021    INFLUENZA VACCINE  08/07/2022    COVID-19 Vaccine (5 - 2024-25 season) 09/07/2022    MAMMOGRAM  10/03/2022

## 2022-12-04 LAB — T4, FREE: T4, Free: 0.96 ng/dL (ref 0.82–1.77)

## 2022-12-04 LAB — TSH: TSH: 0.482 u[IU]/mL (ref 0.450–4.500)

## 2022-12-16 ENCOUNTER — Other Ambulatory Visit (INDEPENDENT_AMBULATORY_CARE_PROVIDER_SITE_OTHER): Payer: Self-pay | Admitting: "Endocrinology

## 2022-12-18 ENCOUNTER — Other Ambulatory Visit (INDEPENDENT_AMBULATORY_CARE_PROVIDER_SITE_OTHER): Payer: Self-pay | Admitting: "Endocrinology

## 2022-12-19 ENCOUNTER — Other Ambulatory Visit (INDEPENDENT_AMBULATORY_CARE_PROVIDER_SITE_OTHER): Payer: Self-pay | Admitting: "Endocrinology

## 2023-01-18 ENCOUNTER — Other Ambulatory Visit (INDEPENDENT_AMBULATORY_CARE_PROVIDER_SITE_OTHER): Payer: Self-pay | Admitting: "Endocrinology

## 2023-01-20 ENCOUNTER — Other Ambulatory Visit (INDEPENDENT_AMBULATORY_CARE_PROVIDER_SITE_OTHER): Payer: Self-pay | Admitting: "Endocrinology

## 2023-01-21 ENCOUNTER — Other Ambulatory Visit (INDEPENDENT_AMBULATORY_CARE_PROVIDER_SITE_OTHER): Payer: Self-pay | Admitting: "Endocrinology

## 2023-01-21 ENCOUNTER — Other Ambulatory Visit: Payer: Self-pay | Admitting: Family Nurse Practitioner

## 2023-01-21 DIAGNOSIS — E559 Vitamin D deficiency, unspecified: Secondary | ICD-10-CM

## 2023-01-21 DIAGNOSIS — E890 Postprocedural hypothyroidism: Secondary | ICD-10-CM

## 2023-01-21 MED ORDER — VITAMIN D (ERGOCALCIFEROL) 1.25 MG (50000 UT) PO CAPS
50000.0000 [IU] | ORAL_CAPSULE | ORAL | 5 refills | Status: AC
Start: 2023-01-21 — End: ?

## 2023-01-21 NOTE — Telephone Encounter (Addendum)
 Name, strength, directions of requested refill(s):    Armour Thyroid  60 MG tablet    How much medication is remaining: none    Pharmacy to send refill to or patient to pick up rx from office (mark requested pharmacy in BOLD):    [facility] [address] [place], [place] - [address]   Phone: [phone]  Fax: [phone]    Please mark X next to the preferred call back number:    Mobile: [phone] (mobile) X  Home: [phone] (home)       Medication refill request, see above. Thank you   Patient has been informed that medication refill requests should be called in up to one week prior to running out of medication.    Additional Notes: Pt verbalized being upset about not being informed that she needed to schedule an appointment to obtain a refill.     Next Visit: 04/09/2023

## 2023-01-22 MED ORDER — ARMOUR THYROID 60 MG PO TABS
60.0000 mg | ORAL_TABLET | Freq: Every day | ORAL | 0 refills | Status: DC
Start: 2023-01-22 — End: 2023-02-24

## 2023-01-22 NOTE — Telephone Encounter (Signed)
 Patient has made a follow up appt for April, 2025    Last visit 11/01/21    ICD E89.0    Due for a f/u 05/03/22    F/u appt 04/09/23    Last labs 12/03/22

## 2023-02-17 ENCOUNTER — Other Ambulatory Visit (INDEPENDENT_AMBULATORY_CARE_PROVIDER_SITE_OTHER): Payer: Self-pay | Admitting: "Endocrinology

## 2023-02-17 DIAGNOSIS — E890 Postprocedural hypothyroidism: Secondary | ICD-10-CM

## 2023-02-17 NOTE — Telephone Encounter (Signed)
Last Visit:  11/01/21    ICD:   E89.0    Due for f/u:  05/03/22    F/u appt: 04/09/23    Last lab: 12/03/22

## 2023-02-24 ENCOUNTER — Other Ambulatory Visit (INDEPENDENT_AMBULATORY_CARE_PROVIDER_SITE_OTHER): Payer: Self-pay

## 2023-02-24 ENCOUNTER — Telehealth (INDEPENDENT_AMBULATORY_CARE_PROVIDER_SITE_OTHER): Payer: Self-pay | Admitting: "Endocrinology

## 2023-02-24 DIAGNOSIS — E890 Postprocedural hypothyroidism: Secondary | ICD-10-CM

## 2023-02-24 MED ORDER — ARMOUR THYROID 60 MG PO TABS
60.0000 mg | ORAL_TABLET | Freq: Every day | ORAL | 1 refills | Status: DC
Start: 2023-02-24 — End: 2023-04-09

## 2023-02-24 MED ORDER — ARMOUR THYROID 15 MG PO TABS
15.0000 mg | ORAL_TABLET | Freq: Every day | ORAL | 1 refills | Status: DC
Start: 2023-02-24 — End: 2023-04-09

## 2023-02-24 NOTE — Telephone Encounter (Signed)
 Pt called to complain that she had to pay out-of-pocket for her Armour Thyroid  15 MG tablet and Armour Thyroid  60 MG tablet because Dr. Ardelle did not send scripts for a 30 day supply. Pt tried to blame the clinical team and the pharmacy for not being able to get her meds for a 30 day supply. Pt stated that her insurance only provides coverage for 30 day supplies. Per review of the pts chart, I did not find anything that indicates that she needs a 30 day supply. Hence, why a 3 month supply has been sent to the pharmacy. I sent a SC to the clinical team who was able to assist.

## 2023-02-24 NOTE — Telephone Encounter (Signed)
 Pt was sch with Call center for an appointment with Dr.Davydov on 4/3 at 1PM

## 2023-03-04 ENCOUNTER — Telehealth (INDEPENDENT_AMBULATORY_CARE_PROVIDER_SITE_OTHER): Payer: Self-pay | Admitting: "Endocrinology

## 2023-03-04 NOTE — Telephone Encounter (Signed)
 Copied from CRM #[id number]. Topic: Clinical Support - Speak With Nurse  >> Mar 04, 2023  4:19 PM Chaleque F wrote:  Tracy [name] [facility] called about Clinical Support - Speak With Nurse.  Additional details:  The pt is calling to request a prior authorization for: Armour Thyroid  15 MG tablet and Armour Thyroid  60 MG tablet.  The pt is out of medication and is requesting the PA to be called in as urgent. Please call the insurance at: [phone] to request an urgent PA. Please contact the pt to provide an update once an outcome is received.

## 2023-03-05 NOTE — Telephone Encounter (Signed)
 Tracy [name] (KeyBETHA CRAPE) - [id number]  Armour Thyroid  15MG  tablets  status: PA Response - ApprovedCreated: February 26th, 2025 [id number] Sent: February 27th, 2025    PA Case: [id number], Status: Approved, Coverage Starts on: 03/05/2023 12:00:00 AM, Coverage Ends on: 03/04/2024 12:00:00 AM.     Tracy [name] (Key: [facility]) [id number]  Armour Thyroid  60MG  tablets  status: NewCreated: February 12th, 2025 [id number]    Available without authorization.

## 2023-04-02 ENCOUNTER — Telehealth (INDEPENDENT_AMBULATORY_CARE_PROVIDER_SITE_OTHER): Payer: Self-pay | Admitting: Family Medicine

## 2023-04-02 NOTE — Telephone Encounter (Signed)
 Copied from CRM [id number]. Topic: Clinical Support - Speak With Nurse  >> Apr 02, 2023  4:26 PM Alyce T wrote:  Tracy [name] called about Clinical Support - Speak With Nurse.  Additional details:    Gerome from [facility] pharmacy calling to speak to provider regarding patient prescriptions the provider has not sent   Callback#[phone]

## 2023-04-03 NOTE — Telephone Encounter (Signed)
 Called [facility] and spoke to pharmacy tech. Advised by the tech that the pt needs medication for travel. Pt received all vaccines needed. Medication requested for the pt for travel for malaria and Zithromax . Advised the pt will need an appointment in order to obtain prescriptions for travel. The tech states he would contact the pt to notify.

## 2023-04-07 ENCOUNTER — Encounter (INDEPENDENT_AMBULATORY_CARE_PROVIDER_SITE_OTHER): Payer: Self-pay | Admitting: Family Medicine

## 2023-04-07 ENCOUNTER — Telehealth (INDEPENDENT_AMBULATORY_CARE_PROVIDER_SITE_OTHER): Admitting: Family Medicine

## 2023-04-07 VITALS — Ht 62.0 in | Wt 138.0 lb

## 2023-04-07 DIAGNOSIS — A09 Infectious gastroenteritis and colitis, unspecified: Secondary | ICD-10-CM

## 2023-04-07 DIAGNOSIS — J309 Allergic rhinitis, unspecified: Secondary | ICD-10-CM

## 2023-04-07 DIAGNOSIS — Z2989 Encounter for other specified prophylactic measures: Secondary | ICD-10-CM

## 2023-04-07 MED ORDER — ALBUTEROL SULFATE HFA 108 (90 BASE) MCG/ACT IN AERS
2.0000 | INHALATION_SPRAY | RESPIRATORY_TRACT | 0 refills | Status: AC | PRN
Start: 2023-04-07 — End: ?

## 2023-04-07 MED ORDER — FLUTICASONE PROPIONATE 50 MCG/ACT NA SUSP
2.0000 | Freq: Every day | NASAL | 2 refills | Status: AC
Start: 2023-04-07 — End: ?

## 2023-04-07 MED ORDER — ATOVAQUONE-PROGUANIL HCL 250-100 MG PO TABS
1.0000 | ORAL_TABLET | Freq: Every day | ORAL | 0 refills | Status: AC
Start: 2023-04-07 — End: 2023-04-30

## 2023-04-07 MED ORDER — AZITHROMYCIN 500 MG PO TABS
1000.0000 mg | ORAL_TABLET | Freq: Once | ORAL | 0 refills | Status: AC | PRN
Start: 2023-04-07 — End: 2023-04-09

## 2023-04-07 NOTE — Progress Notes (Signed)
 Subjective:    Date: 04/07/2023 1:08 PM   Patient ID: Tracy [name] is a 58 y.o. female.    This visit is being conducted via video and audio.    Verbal consent has been obtained from the patient to conduct a video and audio visit.    Patient confirms she is in the state of [place]  at the time of this visit    Chief Complaint   Patient presents with    Travel Consult     Medication for travel      HPI  Patient will be traveling to [place] - [place], [place], and [place].   States that she has gone to a travel clinic and has had recommended vaccinations.  Will be traveling from 4/7 through 4/19  Requesting refill for albuterol  inhaler to take with her on trip  States needs alternative to Qnasal prescribed before due to cost (prescribed by Dr. Fernand 11/2022)    No problems updated.    Problem List[1]  Patient Care Team:  Beola Tawni LABOR, DO as PCP - General (Family Medicine)  Jerri Nicolas, MD as Consulting Physician (Gastroenterology)  Ardelle Lefevre, MD as Consulting Physician (Endocrinology, Diabetes and Metabolism)  Sumner Camellia NOVAK, MD as Consulting Physician (Neurology)  Thalia Leita LABOR, MD as Consulting Physician (Neurology)  Teresa Deward ORN, MD as Consulting Physician (Vascular Surgery)  Charma End, MD as Consulting Physician (Medical Oncology)    Immunization History   Administered Date(s) Administered    COVID-19 mRNA BIVALENT vaccine 12 years and above [employer]) 30 mcg/0.3 mL 11/21/2020    COVID-19 mRNA MONOVALENT vaccine PRIMARY SERIES 12 years and above [employer]) 30 mcg/0.3 mL (DILUTE BEFORE USE) 04/16/2019, 05/14/2019    COVID-19 mRNA seasonal vaccine 12 years and above [employer]) 30 mcg/0.3 mL 04/02/2023    Hepatitis A and Hepatitis B vaccine 03/05/2023, 04/02/2023    Pneumococcal polysaccharide vaccine, 23 valent 07/14/2019    Poliovirus vaccine, inactivated 03/05/2023    Tdap (tetanus, diphtheria reduced, acellular pertussis), adsorbed vaccine 01/07/2011, 04/02/2023    Typhoid vaccine, live,  oral 03/05/2023    Yellow Fever vaccine 03/05/2023    Zoster (SHINGRIX ) vaccine, recombinant 07/14/2019     Current Medications[2]  Allergies[3]    Past Medical History:   Diagnosis Date    Anemia     Arthritis     Carotid stenosis     Diarrhea 12/2016    Since I had my dual switch surgery    Fatty liver 2017    GERD (gastroesophageal reflux disease)     Hepatic cirrhosis (CMS/HCC)     Hiatal hernia     Hyperthyroidism 2013    Thyroid  removed    Sickle-cell anemia (CMS/HCC)     Sleep apnea     not using CPAP machine    Vitamin D  deficiency      Past Surgical History[4]    Family History[5]    Social History[6]  The following portions of the patient's history were reviewed and updated as appropriate: allergies, current medications, past family history, past medical history, past social history, past surgical history and problem list.    Review of Systems     Objective:   Ht 1.575 m (5' 2)   Wt 62.6 kg (138 lb)   LMP  (LMP Unknown)   BMI 25.24 kg/m   Wt Readings from Last 3 Encounters:   04/07/23 62.6 kg (138 lb)   12/03/22 62.6 kg (138 lb)   10/03/22 62.1 kg (137 lb)  Physical Exam       Assessment/Plan:       1. Allergic rhinitis, unspecified seasonality, unspecified trigger  - fluticasone  (FLONASE ) 50 MCG/ACT nasal spray; 2 sprays by Nasal route daily  Dispense: 16 g; Refill: 2  - albuterol  sulfate HFA (PROVENTIL ) 108 (90 Base) MCG/ACT inhaler; Inhale 2 puffs into the lungs every 4 (four) hours as needed for Wheezing or Shortness of Breath (cough)  Dispense: 1 each; Refill: 0  Will continue to monitor use of albuterol  as potential concern for asthma    2. Need for malaria prophylaxis  - atovaquone -proguanil (MALARONE ) 250-100 MG Tablet; Take 1 tablet by mouth daily for 23 days Take 1 tablet by mouth daily with food. Start 2d before entry to malarial region, discontinue 7d after leaving.  Dispense: 23 tablet; Refill: 0    3. Traveler's diarrhea  - azithromycin  (ZITHROMAX ) 500 MG tablet; Take 2 tablets (1,000  mg) by mouth once as needed (severe diarrhea)  Dispense: 2 tablet; Refill: 0  Antibiotic chemoprophylaxis for travelers' diarrhea is not used routinely due to several drawbacks and majority typically resolve spontaneously.    The use of probiotics, prebiotics, or antibiotics for universal diarrhea prevention lacks evidence and is not recommended.   Counseled on prevention of travelers' diarrhea by prudent selection of food and drink.   Recommend oral hydration and salt intake for travelers' diarrhea.  For nonbloody diarrhea with severe symptoms that affect normal activities, can take prescribed antibiotic  Single dose azithromycin  1,000 mg  Counseled on the need to seek medical attention if falls ill during travel for appropriate diagnosis and treatment.   If fever, dysentery, or persistent symptoms, they should seek immediate medical evaluation    Risk & Benefits of the new medication(s) were explained to the patient (and family) who verbalized understanding & agreed to the treatment plan. Patient (family) encouraged to contact me/clinical staff with any questions/concerns    No follow-ups on file.    Tawni DELENA First, DO         [1]   Patient Active Problem List  Diagnosis    Migraine    History of hypertension    Abdominal cyst    Hiatal hernia    Hypokalemia    S/P gastric bypass    Hepatic steatosis    Postsurgical hypothyroidism    Tubular adenoma of colon    Allergic rhinitis    Sleep difficulties    Difficulty concentrating    Forgetfulness    Hx of migraine headaches    Occipital headache    Frontal headache    Hx of concussion    White matter abnormality on MRI of brain    Hx of sleep apnea    Irritable bowel syndrome with diarrhea    Chronic diarrhea    Preventative health care    Normocytic anemia    Carotid stenosis, asymptomatic, right   [2]   Current Outpatient Medications   Medication Sig Dispense Refill    [employer] Thyroid  15 MG tablet Take 1 tablet (15 mg) by mouth daily 30 tablet 1    [employer]  Thyroid  60 MG tablet Take 1 tablet (60 mg) by mouth daily 30 tablet 1    Ascorbic Acid (vitamin C ) 250 MG tablet Take 1 tablet (250 mg) by mouth every other day 100 tablet 0    Beclomethasone Dipropionate  (Qnasl ) 80 MCG/ACT Aero Soln 2 sprays by Nasal route as needed (sinus congestion) 10.6 g 3    Biotin 5 MG/ML Liquid  Take by mouth daily         butalbital -acetaminophen -caffeine  (FIORICET ) 50-325-40 MG per tablet Take 1 tablet by mouth every 4 (four) hours as needed for Headaches 15 tablet 0    calcium carbonate 1500 (600 Ca) MG Tab tablet Take 1 tablet (600 mg) by mouth      cetirizine  (ZyrTEC  Allergy) 10 MG tablet Take 1 tablet (10 mg) by mouth daily 90 tablet 1    COPPER  PO Take by mouth      diclofenac  Sodium (VOLTAREN ) 1 % Gel topical gel Apply 4 g to affected area 4 times daily as needed.  Max: 16 g/joint/day.  Do not apply to broken, inflamed, or infected skin. 100 g 1    DULoxetine  (CYMBALTA ) 20 MG capsule Take 1 capsule (20 mg) by mouth daily 30 capsule 2    ferrous gluconate  (FERGON) 324 MG tablet Take 1 tablet (324 mg) by mouth every other day 100 tablet 0    montelukast  (SINGULAIR ) 10 MG tablet TAKE 1 TABLET(10 MG) BY MOUTH EVERY NIGHT 90 tablet 1    montelukast  (SINGULAIR ) 10 MG tablet Take 1 tablet (10 mg) by mouth nightly (Patient taking differently: Take 1 tablet (10 mg) by mouth as needed) 30 tablet 0    Multiple Vitamins-Minerals (ZINC  PO) Take by mouth      Spacer/Aero-Holding Chambers (AEROCHAMBER Z-STAT PLUS/MEDIUM) inhaler Use as instructed 1 each 0    SUMAtriptan (IMITREX) 50 MG tablet Take 1 tablet (50 mg) by mouth as needed      VITAMIN A  PO Take by mouth      vitamin D  (CHOLECALCIFEROL) 25 MCG (1000 UT) tablet Take 1 tablet (1,000 Units) by mouth daily      vitamin D , ergocalciferol , (DRISDOL ) 50000 UNIT Cap Take 1 capsule (50,000 Units) by mouth once a week 4 capsule 5    albuterol  sulfate HFA (PROVENTIL ) 108 (90 Base) MCG/ACT inhaler Inhale 2 puffs into the lungs every 4 (four) hours as  needed for Wheezing or Shortness of Breath (cough) 1 each 0    atovaquone -proguanil (MALARONE ) 250-100 MG Tablet Take 1 tablet by mouth daily for 23 days Take 1 tablet by mouth daily with food. Start 2d before entry to malarial region, discontinue 7d after leaving. 23 tablet 0    azithromycin  (ZITHROMAX ) 500 MG tablet Take 2 tablets (1,000 mg) by mouth once as needed (severe diarrhea) 2 tablet 0    fluticasone  (FLONASE ) 50 MCG/ACT nasal spray 2 sprays by Nasal route daily 16 g 2    Multiple Vitamins-Minerals (MULTIVITAMIN WITH MINERALS) tablet Take 1 tablet by mouth daily       No current facility-administered medications for this visit.   [3]   Allergies  Allergen Reactions    Bee Pollen Swelling     Bee Stings.SABRASABRASABRAStuffy nose; throat swelling      Vicodin [Hydrocodone-Acetaminophen ] Nausea And Vomiting   [4]   Past Surgical History:  Procedure Laterality Date    CHOLECYSTECTOMY  12/2016    COLONOSCOPY, WITH BIOPSY N/A 01/27/2018    Procedure: COLONOSCOPY, BIOPSY;  Surgeon: Marie Mara Pringle, MD;  Location: [facility];  Service: Gastroenterology;  Laterality: N/A;    DUODENAL SWITCH  12/2016    EGD, BIOPSY N/A 01/27/2018    Procedure: EGD, BIOPSY;  Surgeon: Marie Mara Pringle, MD;  Location: [facility];  Service: Gastroenterology;  Laterality: N/A;    GASTRECTOMY  09/2013    HYSTERECTOMY  2015    Partial.    LAPAROSCOPIC REVISION SLEEVE  GASTRECTOMY TO GASTRIC BYPASS      LAPAROSCOPIC, GASTRECTOMY SLEEVE      SALPINGO OOPHERECTOMY Bilateral     THYROIDECTOMY, TOTAL  2013   [5]   Family History  Problem Relation Name Age of Onset    Cancer Mother LUCILIA YANNI         hx ovarian    Hypertension Mother PAOLINA KARWOWSKI     Inflammatory bowel disease Mother NARELY NOBLES     Heart failure Mother TAYTUM SCHECK     Angina Mother KORRYN PANCOAST     Migraines Mother Baker JONETTA Daring     Asthma Father Father     Diabetes Father Father     Hyperlipidemia Father Father     Hypertension Father Father     Cancer  Father Father         Currently has cancer    Crohn's disease Father Father     Asthma Sister NEILAH FULWIDER     Diabetes Sister MARIELY MAHR     Hyperlipidemia Sister KRYSTAL TEACHEY     Hypertension Sister Baker JONETTA Daring     Early death Sister FALECIA VANNATTER     Cancer Brother Baker JONETTA Daring         hx of stomach    Hyperlipidemia Brother LADAWN BOULLION     Hypertension Brother Baker JONETTA Daring     Migraines Brother Baker JONETTA Daring     Learning disabilities Brother Baker JONETTA Daring         Deaf    Breast cancer Paternal Aunt          died of     Breast cancer Other cousin         maternal cousin-- survior    Obesity Son AVABELLA WAILES    [6]   Social History  Tobacco Use    Smoking status: Never    Smokeless tobacco: Never    Tobacco comments:     None   Vaping Use    Vaping status: Never Used   Substance Use Topics    Alcohol use: No    Drug use: Not Currently

## 2023-04-07 NOTE — Progress Notes (Signed)
 Have you seen any specialists/other providers since your last visit with us ?    No    The patient is due for:   Health Maintenance Due   Topic Date Due    Statin Use  Never done    Advance Directive on File  Never done    Pneumonia Vaccine Age 57 Years and Older (2 of 2 - PCV) 07/13/2020    Tetanus Ten-Year  01/06/2021    Annual Exam  09/18/2021    COVID-19 Vaccine (5 - 2024-25 season) 09/07/2022    Mammogram  10/03/2022

## 2023-04-09 ENCOUNTER — Telehealth (INDEPENDENT_AMBULATORY_CARE_PROVIDER_SITE_OTHER): Payer: Medicaid Other | Admitting: "Endocrinology

## 2023-04-09 ENCOUNTER — Encounter (INDEPENDENT_AMBULATORY_CARE_PROVIDER_SITE_OTHER): Payer: Self-pay | Admitting: "Endocrinology

## 2023-04-09 VITALS — Ht 62.5 in | Wt 138.0 lb

## 2023-04-09 DIAGNOSIS — E890 Postprocedural hypothyroidism: Secondary | ICD-10-CM

## 2023-04-09 MED ORDER — ARMOUR THYROID 60 MG PO TABS
60.0000 mg | ORAL_TABLET | Freq: Every day | ORAL | 5 refills | Status: DC
Start: 2023-04-09 — End: 2023-11-13

## 2023-04-09 MED ORDER — ARMOUR THYROID 15 MG PO TABS
15.0000 mg | ORAL_TABLET | Freq: Every day | ORAL | 5 refills | Status: DC
Start: 2023-04-09 — End: 2023-05-05

## 2023-04-09 NOTE — Progress Notes (Signed)
 Verbal consent has been obtained from the patient to conduct a video visit.  Patient has verified that he/she are physically located in the state of [place]  during the time of our appointment.      Chief Complaint:  Chief Complaint   Patient presents with    Hypothyroidism       HPI:  Tracy [name] is a 58 y.o. female with hypothyroidism s/p thyroidectomy in 2013 (benign), chronic sinusitis, s/p sleeve gastrectomy (2014, revision in 2018), who returns for follow up.  Last visit 10/2021    Patient is taking Armour thyroid  60mg  daily + 15mg  daily.    She takes her vitamins within an hour after Armour.  She is taking more vitamins at this time.      She reports having a heart flutter every once in a while, about 1-2 times monthly, lasting for a few seconds. This began 1-2 months ago.   Occurs when she is sitting or when she is walking.  Denies weight loss or weight gain.  She reports heat intolerance more so recently. She does not have increased sweating.   She has insomnia, sometimes stays up for up to 2 days.   More anxiety now due to travel plans to [place].    Labs:  Component      Latest Ref Rng & Units 07/05/2019   TSH  0.35 - 4.94 uIU/mL 4.79   T4 Free 0.70 - 1.48 ng/dL 1.91   T3 Free  4.78 - 3.71 pg/mL 2.10     Component      Latest Ref Rng & Units 08/03/2020   Vitamin D  25-OH Total      30 - 100 ng/mL 38   TSH    0.35 - 4.94 uIU/mL 7.19 (H)   T4 Free   0.70 - 1.48 ng/dL 2.95   T3 Free  6.21 - 3.71 pg/mL 2.91     Component      Latest Ref Rng 08/03/2020 09/18/2020 12/19/2020   TSH   0.35 - 4.94 uIU/mL 7.19 (H)  6.880 (H)  0.80    T4 Free  0.69 - 1.48 ng/dL 3.08  6.57  8.46    T3 Free   1.71 - 3.71 pg/mL 2.91   2.27           Imaging:  Thyroid  ultrasound 03/22/20 [place]: s/p thyroidectomy, no abnormalities    Problem List:  Patient Active Problem List   Diagnosis    Migraine    History of hypertension    Abdominal cyst    Hiatal hernia    Hypokalemia    S/P gastric bypass    Hepatic steatosis    Postsurgical  hypothyroidism    Tubular adenoma of colon    Allergic rhinitis    Sleep difficulties    Difficulty concentrating    Forgetfulness    Hx of migraine headaches    Occipital headache    Frontal headache    Hx of concussion    White matter abnormality on MRI of brain    Hx of sleep apnea    Irritable bowel syndrome with diarrhea    Chronic diarrhea    Preventative health care    Normocytic anemia    Carotid stenosis, asymptomatic, right       Current Medications:  Current Outpatient Medications on File Prior to Visit   Medication Sig Dispense Refill    albuterol  sulfate HFA (PROVENTIL ) 108 (90 Base) MCG/ACT inhaler Inhale 2 puffs into the lungs every 4 (  four) hours as needed for Wheezing or Shortness of Breath (cough) 1 each 0    Ascorbic Acid (vitamin C ) 250 MG tablet Take 1 tablet (250 mg) by mouth every other day 100 tablet 0    Beclomethasone Dipropionate  (Qnasl ) 80 MCG/ACT Aero Soln 2 sprays by Nasal route as needed (sinus congestion) 10.6 g 3    Biotin 5 MG/ML Liquid Take by mouth daily         butalbital -acetaminophen -caffeine  (FIORICET ) 50-325-40 MG per tablet Take 1 tablet by mouth every 4 (four) hours as needed for Headaches 15 tablet 0    calcium carbonate 1500 (600 Ca) MG Tab tablet Take 1 tablet (600 mg) by mouth      cetirizine  (ZyrTEC  Allergy) 10 MG tablet Take 1 tablet (10 mg) by mouth daily 90 tablet 1    COPPER  PO Take by mouth      diclofenac  Sodium (VOLTAREN ) 1 % Gel topical gel Apply 4 g to affected area 4 times daily as needed.  Max: 16 g/joint/day.  Do not apply to broken, inflamed, or infected skin. 100 g 1    DULoxetine  (CYMBALTA ) 20 MG capsule Take 1 capsule (20 mg) by mouth daily 30 capsule 2    ferrous gluconate  (FERGON) 324 MG tablet Take 1 tablet (324 mg) by mouth every other day 100 tablet 0    fluticasone  (FLONASE ) 50 MCG/ACT nasal spray 2 sprays by Nasal route daily 16 g 2    montelukast  (SINGULAIR ) 10 MG tablet TAKE 1 TABLET(10 MG) BY MOUTH EVERY NIGHT 90 tablet 1    montelukast   (SINGULAIR ) 10 MG tablet Take 1 tablet (10 mg) by mouth nightly (Patient taking differently: Take 1 tablet (10 mg) by mouth as needed) 30 tablet 0    Multiple Vitamins-Minerals (MULTIVITAMIN WITH MINERALS) tablet Take 1 tablet by mouth daily      Multiple Vitamins-Minerals (ZINC  PO) Take by mouth      Spacer/Aero-Holding Chambers (AEROCHAMBER Z-STAT PLUS/MEDIUM) inhaler Use as instructed (Patient taking differently: as needed Use as instructed) 1 each 0    SUMAtriptan (IMITREX) 50 MG tablet Take 1 tablet (50 mg) by mouth as needed      VITAMIN A  PO Take by mouth      vitamin D  (CHOLECALCIFEROL) 25 MCG (1000 UT) tablet Take 1 tablet (1,000 Units) by mouth daily      vitamin D , ergocalciferol , (DRISDOL ) 50000 UNIT Cap Take 1 capsule (50,000 Units) by mouth once a week 4 capsule 5     No current facility-administered medications on file prior to visit.       Allergies:  Allergies   Allergen Reactions    Bee Pollen Swelling     "Bee Stings".Aaron AasAaron AasAaron AasStuffy nose; "throat swelling"      Vicodin [Hydrocodone-Acetaminophen ] Nausea And Vomiting       Past Medical History:  Past Medical History:   Diagnosis Date    Anemia     Arthritis     Carotid stenosis     Diarrhea 12/2016    Since I had my dual switch surgery    Fatty liver 2017    GERD (gastroesophageal reflux disease)     Hepatic cirrhosis (CMS/HCC)     Hiatal hernia     Hyperthyroidism 2013    Thyroid  removed    Sickle-cell anemia (CMS/HCC)     Sleep apnea     not using CPAP machine    Vitamin D  deficiency        Past Surgical History:  Past Surgical  History:   Procedure Laterality Date    CHOLECYSTECTOMY  12/2016    COLONOSCOPY, WITH BIOPSY N/A 01/27/2018    Procedure: COLONOSCOPY, BIOPSY;  Surgeon: Tomasita Fox, MD;  Location: [facility];  Service: Gastroenterology;  Laterality: N/A;    DUODENAL SWITCH  12/2016    EGD, BIOPSY N/A 01/27/2018    Procedure: EGD, BIOPSY;  Surgeon: Tomasita Fox, MD;  Location: [facility];  Service: Gastroenterology;   Laterality: N/A;    GASTRECTOMY  09/2013    HYSTERECTOMY  2015    Partial.    LAPAROSCOPIC REVISION SLEEVE GASTRECTOMY TO GASTRIC BYPASS      LAPAROSCOPIC, GASTRECTOMY SLEEVE      SALPINGO OOPHERECTOMY Bilateral     THYROIDECTOMY, TOTAL  2013       Family History:  Family History   Problem Relation Name Age of Onset    Cancer Mother SIENNA BARTOSZEK         hx ovarian    Hypertension Mother ENSLEIGH CAPILLA     Inflammatory bowel disease Mother MARIBEL ELAM     Heart failure Mother ALEESE ELIZONDO     Angina Mother NABEELA RODA     Migraines Mother Humphrey Magnuson     Asthma Father Father     Diabetes Father Father     Hyperlipidemia Father Father     Hypertension Father Father     Cancer Father Father         Currently has cancer    Crohn's disease Father Father     Asthma Sister DEBHORA BAZURTO     Diabetes Sister EUNICE ZERN     Hyperlipidemia Sister KAMPBELL DIFELICE     Hypertension Sister Humphrey Magnuson     Early death Sister ANALYCE DILAURA     Cancer Brother Humphrey Magnuson         hx of stomach    Hyperlipidemia Brother MICHALENE CUSSON     Hypertension Brother Humphrey Magnuson     Migraines Brother Humphrey Magnuson     Learning disabilities Brother Humphrey Magnuson         Deaf    Breast cancer Paternal Aunt          died of     Breast cancer Other cousin         maternal cousin-- survior    Obesity Son Humphrey Magnuson        Social History:  Social History     Socioeconomic History    Marital status: Significant Other   Tobacco Use    Smoking status: Never    Smokeless tobacco: Never    Tobacco comments:     None   Vaping Use    Vaping status: Never Used   Substance and Sexual Activity    Alcohol use: Yes     Comment: Wine occasionally    Drug use: Not Currently    Sexual activity: Not Currently     Partners: Male     Birth control/protection: Post-menopausal, Rhythm   Other Topics Concern    Dietary supplements / vitamins Yes     Comment: Biotin B12 multi vitamins D3    Anesthesia problems No    Blood thinners No    Pregnant No     Future Children No    Number of Pregnancies? No    Number of children Yes     Comment: 1  Miscarriages / Abortions? No    Eats large amounts No    Excessive Sweets No    Skips meals No    Eats excessive starches No    Snacks or grazes No    Emotional eater No    Eats fried food No    Eats fast food No    Diet Center Yes    Delight Felts No    [facility] Weight Loss No    Nutri-System No    Opti-Fast / Medi-Fast Yes    Overeaters Anonymous No    [facility] Yes    TOPS No    Weight Watchers Yes    Atkins Yes    Binging / Purging No    Calorie Counting Yes    Fasting Yes    High Protein Yes    Low Carb Yes    Low Fat No    [facility] Diet No    Slim Fast Yes    [place] No    Stationary cycle or treadmill Yes    Gym/fitness Classes Yes    Home exercise/video Yes    Swimming Yes    Weight training Yes    Walking or running Yes    Hospitalization No    Hypnosis No    Physical therapy Yes    Psychological therapy Yes    Residential program No    Acutrim No    [place] No    Contrave No    Dexatrim Yes    Diethylpropion No    Fastin No    Fen - Phen Yes    Ionamin / Adipex No    Phentermine Yes    Qsymia No    Prozac Yes    Saxenda No    Topamax No    Wellbutrin No    Xenical (Orlistat, Alli) No    Other Med No    No impairment Yes    Walks with cane/crutch Yes    Requires a wheelchair No    Bedridden No    Are you currently being treated for depression? No    Do you snore? No    Are you receiving any medical or psychological services? No    Do you ever wake up at night gasping for breath? No    Do you have or have you been treated for an eating disorder? No    Anyone ever told you that you stop breathing while asleep? No    Do you exercise regularly? Yes    Have you or family member ever have trouble with anesthesia? Yes     Social Drivers of Health     Financial Resource Strain: Patient Declined (08/26/2022)    Overall Financial Resource Strain (CARDIA)     Difficulty of Paying Living Expenses: Patient  declined   Recent Concern: Financial Resource Strain - Medium Risk (06/16/2022)    Overall Financial Resource Strain (CARDIA)     Difficulty of Paying Living Expenses: Somewhat hard   Food Insecurity: Unknown (08/26/2022)    Hunger Vital Sign     Worried About Running Out of Food in the Last Year: Never true     Ran Out of Food in the Last Year: Patient declined   Transportation Needs: Patient Declined (08/26/2022)    PRAPARE - Therapist, art (Medical): Patient declined     Lack of Transportation (Non-Medical): Patient declined   Physical Activity: Unknown (08/26/2022)    Exercise  Vital Sign     Days of Exercise per Week: Patient declined     Minutes of Exercise per Session: 50 min   Stress: Patient Declined (08/26/2022)    [employer] of Occupational Health - Occupational Stress Questionnaire     Feeling of Stress : Patient declined   Recent Concern: Stress - Stress Concern Present (06/16/2022)    [employer] of Occupational Health - Occupational Stress Questionnaire     Feeling of Stress : To some extent   Social Connections: Unknown (08/26/2022)    Social Connection and Isolation Panel [NHANES]     Frequency of Communication with Friends and Family: Patient declined     Frequency of Social Gatherings with Friends and Family: Once a week     Attends Religious Services: More than 4 times per year     Active Member of [employer] or Organizations: Patient declined     Attends Banker Meetings: Patient declined     Marital Status: Patient declined   Intimate Partner Violence: Patient Declined (08/26/2022)    Humiliation, Afraid, Rape, and Kick questionnaire     Fear of Current or Ex-Partner: Patient declined     Emotionally Abused: Patient declined     Physically Abused: Patient declined     Sexually Abused: Patient declined   Housing Stability: Patient Declined (08/26/2022)    Housing Stability Vital Sign     Unable to Pay for Housing in the Last Year: Patient declined      Homeless in the Last Year: Patient declined   Recent Concern: Housing Stability - High Risk (06/16/2022)    Housing Stability Vital Sign     Unable to Pay for Housing in the Last Year: Yes     Number of Places Lived in the Last Year: 1     Unstable Housing in the Last Year: No         Visit Vitals  Ht 1.588 m (5' 2.5")   Wt 62.6 kg (138 lb)   LMP  (LMP Unknown)   BMI 24.84 kg/m        BP Readings from Last 3 Encounters:   12/03/22 (!) 163/92   10/03/22 145/89   08/13/22 (!) 166/95        Wt Readings from Last 4 Encounters:   04/09/23 1253 62.6 kg (138 lb)   04/07/23 1038 62.6 kg (138 lb)   12/03/22 1003 62.6 kg (138 lb)   10/03/22 0955 62.1 kg (137 lb)        Physical Exam:  GENERAL APPEARANCE: alert, in no acute distress, well developed, well nourished  HEENT: EOMI, no lid lag, no proptosis, no scleral icterus, no conjunctival erythema  NECK/THYROID : no obvious neck masses when examined visually  MUSCULOSKELETAL: no obvious deformities  NEUROLOGICAL: no hand tremor bilaterally  PULMONARY: normal respiratory effort  SKIN: no visible rashes  PSYCHIATRIC: normal mood, appropriate affect         Assessment/Plan:  Tracy [name] is a 58 y.o. female with    1. Postoperative hypothyroidism        1. Postoperative hypothyroidism   - Continue Armour 60mg  + 15mg  daily   - Check TFTs and adjust Armour as needed        Orders Placed This Encounter    T3, Free    T4, Free    TSH    Armour Thyroid  60 MG tablet    DISCONTD: Armour Thyroid  15 MG tablet  Medications Discontinued During This Encounter   Medication Reason    Armour Thyroid  60 MG tablet Reorder    Armour Thyroid  15 MG tablet Reorder        Return in about 1 year (around 04/08/2024).    Irineo Manns, MD

## 2023-05-04 ENCOUNTER — Other Ambulatory Visit

## 2023-05-05 ENCOUNTER — Telehealth: Payer: Self-pay | Admitting: "Endocrinology

## 2023-05-05 ENCOUNTER — Ambulatory Visit (INDEPENDENT_AMBULATORY_CARE_PROVIDER_SITE_OTHER): Payer: Self-pay | Admitting: "Endocrinology

## 2023-05-05 DIAGNOSIS — E890 Postprocedural hypothyroidism: Secondary | ICD-10-CM

## 2023-05-05 LAB — T3, FREE: T3, Free: 3 pg/mL (ref 2.0–4.4)

## 2023-05-05 LAB — T4, FREE: T4, Free: 0.67 ng/dL — ABNORMAL LOW (ref 0.82–1.77)

## 2023-05-05 LAB — TSH: TSH: 9.73 u[IU]/mL — ABNORMAL HIGH (ref 0.450–4.500)

## 2023-05-05 MED ORDER — ARMOUR THYROID 30 MG PO TABS
30.0000 mg | ORAL_TABLET | Freq: Every day | ORAL | 2 refills | Status: DC
Start: 2023-05-05 — End: 2023-08-05

## 2023-05-05 NOTE — Telephone Encounter (Signed)
 Pt message replied through mcm.

## 2023-05-05 NOTE — Telephone Encounter (Addendum)
 Called patient,  She c/o Fatigue   2 episodes of heart fluttering prior to traveling to [place] may have been due to anxiety    Rec to increase armour from 75mg  to 90mg    Check TFTs in 1 month

## 2023-05-05 NOTE — Telephone Encounter (Signed)
 Patient is calling to speak to the medical team or Dr Elenora Griffiths about her results that she did for blood work. Some of the labs came back abnormal and is asking for medical guidance on whether she needs to got to urgent care or Er.    If we could please reach back out to her and assist.    Thank you

## 2023-05-15 ENCOUNTER — Telehealth (INDEPENDENT_AMBULATORY_CARE_PROVIDER_SITE_OTHER): Payer: Self-pay | Admitting: Family Medicine

## 2023-05-15 DIAGNOSIS — Z1231 Encounter for screening mammogram for malignant neoplasm of breast: Secondary | ICD-10-CM

## 2023-05-15 NOTE — Telephone Encounter (Signed)
 Screening mammogram order placed.  Check with patient if any symptoms.  If having symptoms, please schedule appointment and notify PCP

## 2023-05-15 NOTE — Telephone Encounter (Signed)
 Copied from CRM #[id number]. Topic: Clinical Support - Medical Question  >> May 15, 2023 10:17 AM Daphne NOVAK wrote:  Tracy [name] called about Clinical Support - Medical Question.  Additional details:  Pt call asking for a mammo order due to previous hx of mammo wants to get order asap

## 2023-05-18 NOTE — Telephone Encounter (Signed)
 Called and spoke to pt. Pt states she does have symptoms at this time. Pt will be scheduled for appt on 05/21/23 at 11:30 AM.

## 2023-05-21 ENCOUNTER — Ambulatory Visit (INDEPENDENT_AMBULATORY_CARE_PROVIDER_SITE_OTHER): Admitting: Family Medicine

## 2023-05-25 ENCOUNTER — Ambulatory Visit (INDEPENDENT_AMBULATORY_CARE_PROVIDER_SITE_OTHER): Admitting: Family Medicine

## 2023-05-25 ENCOUNTER — Encounter (INDEPENDENT_AMBULATORY_CARE_PROVIDER_SITE_OTHER): Payer: Self-pay | Admitting: Family Medicine

## 2023-05-25 VITALS — BP 179/80 | HR 44 | Temp 98.4°F | Resp 14 | Wt 150.0 lb

## 2023-05-25 DIAGNOSIS — N644 Mastodynia: Secondary | ICD-10-CM

## 2023-05-25 DIAGNOSIS — M940 Chondrocostal junction syndrome [Tietze]: Secondary | ICD-10-CM

## 2023-05-25 DIAGNOSIS — M549 Dorsalgia, unspecified: Secondary | ICD-10-CM

## 2023-05-25 DIAGNOSIS — E509 Vitamin A deficiency, unspecified: Secondary | ICD-10-CM | POA: Insufficient documentation

## 2023-05-25 DIAGNOSIS — J329 Chronic sinusitis, unspecified: Secondary | ICD-10-CM | POA: Insufficient documentation

## 2023-05-25 NOTE — Progress Notes (Signed)
 Have you seen any specialists/other providers since your last visit with us ?    No    Arm preference verified?   Yes, no preference    Health Maintenance Due   Topic Date Due    Statin Use  Never done    Advance Directive on File  Never done    Annual Exam  09/18/2021    Mammogram  10/03/2022

## 2023-05-25 NOTE — Progress Notes (Signed)
 Subjective:      Patient ID: Tracy [name] is a 58 y.o. female.    Chief Complaint:  Chief Complaint   Patient presents with    Breast Pain     Left breast pain for weeks     Back Pain     Left side past week.       HPI:    Tracy [name] is a 59 y.o. female who presents for evaluation of chest wall pain. Onset was 3 years ago. Symptoms have been waxed and waned since that time. The patient describes the pain as aching and sharp in the lateral chest wall: on the left, anterior chest wall: on the left, and posterior chest wall: on the left. Patient rates pain as a 3/10 in intensity. Associated symptoms are: upper back pain. Aggravating factors are: deep inspiration, palpation of chest. Alleviating factors are: rest. Mechanism of injury: none known. Previous visits for this problem: yes, last seen 1 year ago by the patient's PCP. Evaluation to date: none. Treatment to date: prescription NSAIDs: temporarily effective.    The following portions of the patient's history were reviewed and updated as appropriate: allergies, current medications, past family history, past medical history, past social history, past surgical history, and problem list  .            Problem List:  Problem List[1]    Current Medications:  Current Medications[2]    Allergies:  Allergies[3]    Past Medical History:  Medical History[4]    Past Surgical History:  Past Surgical History[5]    Family History:  Family History[6]    Social History:  Social History[7]     The following sections were reviewed this encounter by the provider:        ROS:  Review of Systems  Constitution:  Patient denies appetite changes, chills, fatigue, fever.  Skin:  Patient denies skin changes, skin lesions or rashes. +left breast pain   Eyes:  Patient denies discharge, itching, redness, blurred vision.  HENT:  Patient denies congestion, ear discharge/pain, facial swelling, mouth sores, nosebleeds.  Respiratory:  Patient denies SOB, tightness, cough,  wheezing.  Cardiovascular:  Patient denies leg swelling, palpitations.  +left side chest wall pain  Gastrointestinal:  Patient denies Abd distention/pain, Abd masses, blood in stool, rectal pain/swelling/bleeding, constipation, nausea, vomiting, diarrhea.  Genitourinary:  Patient denies dysuria, flank pain, hematuria, urgency  Musculoskeletal:  +left upper back pain   Endocrine:  Patient denies any cold/heat intolerance, polydipsia, polyphagia, polyuria.  Neurological:  Patient denies weakness, numbness, tingling, facial drop.  Psychiatric:  Denies any depression, anxiety, insomnia, si/hi/pi     Vitals:  BP 179/80 (BP Site: Left arm, Patient Position: Sitting, Cuff Size: Medium)   Pulse (!) 44   Temp 98.4 F (36.9 C) (Oral)   Resp 14   Wt 68 kg (150 lb)   LMP  (LMP Unknown)   BMI 27.00 kg/m      Objective:     Physical Exam:  Physical Exam   Patient alert, oriented x 3, no distress, comfortable  Skin: skin color, texture, turgor normal, no rashes or lesions.  Breast: Inspection: bilateral breast symmetrical, no apparent skin lesions, no apparent masses, no evident nipple discharge   Palpation: no axillary masses, no lateral chest masses including midaxillary line, no significant palpable breast masses in all quadrant bilaterally, no nipple discharge upon pressure bilaterally.  +moderate tenderness on anterior, and lateral left chest wall   Oropharynx: Lips, mucosa, and tongue  normal  Eyes: Conjunctivae and corneas clear.  Neck: No adenopathy, no JVD, supple, symmetrical, trachea midline and Thyroid  not enlarged, no tenderness or masses visible  Lungs: Breaths sounds present bilaterally, clear to auscultation, no wheezing, no rales, no rhonchi   Heart: Regular rate and rhythm, S1-S2 present, no murmurs, no rubs, no gallops   Extremities: Extremities normal, atraumatic, no cyanosis or edema  Back: no evident deviations, no swelling, no erythema or hematomas, mild decrease of ROM due discomfort. Negative SLT  bilaterally  Neuro: Normal without focal finding, mental status, speech normal, normal reflexes and strength.  Psychiatric: He has a normal mood and affect. His behavior is normal. Judgment and thought content normal.     Assessment:     1. Costochondritis    2. Breast pain in female  - Mammo Digital Diagnostic Bilateral W Cad; Future    3. Upper back pain on left side  - X-ray cervical spine 2 Or 3 views; Future  - X-ray thoracic spine AP and lateral; Future      Plan:     Hx and finding compatible with acute muscle skeletal disorder  Conservative approach is been recommended:  -rest.  -NSAIDs  -warm compress on affected area  -avoid lifting weights  -regular activities   -avoid bending, kneeling   -Cervical, and Thoracic spine X-ray         Toni Lorn Burnet, MD        [1]   Patient Active Problem List  Diagnosis    History of hypertension    Hiatal hernia    Hypokalemia    S/P gastric bypass    Hepatic steatosis    Postsurgical hypothyroidism    Tubular adenoma of colon    Allergic rhinitis    Sleep difficulties    Difficulty concentrating    Hx of migraine headaches    Occipital headache    Frontal headache    Hx of concussion    White matter abnormality on MRI of brain    Hx of sleep apnea    Irritable bowel syndrome with diarrhea    Preventative health care    Normocytic anemia    Carotid stenosis, asymptomatic, right    Breast hypertrophy in female    Chronic renal disease, stage III (CMS/HCC)    Essential hypertension    Hypercholesterolemia    Hypothyroidism    Class 2 obesity due to excess calories without serious comorbidity in adult    Muscle tension dysphonia    Non-toxic multinodular goiter    Other malformations of precerebral vessels    Primary osteoarthritis of left knee    Primary osteoarthritis of right knee    S/P laparoscopic hysterectomy    Sleep apnea, obstructive    Vitamin A  deficiency    Vitamin D  insufficiency    Chronic rhinitis    Chronic sinusitis    GERD (gastroesophageal reflux  disease)    Migraine with aura and without status migrainosus, not intractable   [2]   Current Outpatient Medications   Medication Sig Dispense Refill    albuterol  sulfate HFA (PROVENTIL ) 108 (90 Base) MCG/ACT inhaler Inhale 2 puffs into the lungs every 4 (four) hours as needed for Wheezing or Shortness of Breath (cough) 1 each 0    Armour Thyroid  30 MG tablet Take 1 tablet (30 mg) by mouth once daily 30 tablet 2    Armour Thyroid  60 MG tablet Take 1 tablet (60 mg) by mouth daily 30 tablet 5  Ascorbic Acid (vitamin C ) 250 MG tablet Take 1 tablet (250 mg) by mouth every other day 100 tablet 0    Beclomethasone Dipropionate  (Qnasl ) 80 MCG/ACT Aero Soln 2 sprays by Nasal route as needed (sinus congestion) 10.6 g 3    Biotin 5 MG/ML Liquid Take by mouth daily         butalbital -acetaminophen -caffeine  (FIORICET ) 50-325-40 MG per tablet Take 1 tablet by mouth every 4 (four) hours as needed for Headaches 15 tablet 0    calcium carbonate 1500 (600 Ca) MG Tab tablet Take 1 tablet (600 mg) by mouth      cetirizine  (ZyrTEC  Allergy) 10 MG tablet Take 1 tablet (10 mg) by mouth daily 90 tablet 1    COPPER  PO Take by mouth      diclofenac  Sodium (VOLTAREN ) 1 % Gel topical gel Apply 4 g to affected area 4 times daily as needed.  Max: 16 g/joint/day.  Do not apply to broken, inflamed, or infected skin. 100 g 1    DULoxetine  (CYMBALTA ) 20 MG capsule Take 1 capsule (20 mg) by mouth daily 30 capsule 2    ferrous gluconate  (FERGON) 324 MG tablet Take 1 tablet (324 mg) by mouth every other day 100 tablet 0    fluticasone  (FLONASE ) 50 MCG/ACT nasal spray 2 sprays by Nasal route daily 16 g 2    montelukast  (SINGULAIR ) 10 MG tablet TAKE 1 TABLET(10 MG) BY MOUTH EVERY NIGHT 90 tablet 1    montelukast  (SINGULAIR ) 10 MG tablet Take 1 tablet (10 mg) by mouth nightly (Patient taking differently: Take 1 tablet (10 mg) by mouth as needed) 30 tablet 0    Multiple Vitamins-Minerals (MULTIVITAMIN WITH MINERALS) tablet Take 1 tablet by mouth daily       Multiple Vitamins-Minerals (ZINC  PO) Take by mouth      Spacer/Aero-Holding Chambers (AEROCHAMBER Z-STAT PLUS/MEDIUM) inhaler Use as instructed (Patient taking differently: as needed Use as instructed) 1 each 0    SUMAtriptan (IMITREX) 50 MG tablet Take 1 tablet (50 mg) by mouth as needed      VITAMIN A  PO Take by mouth      vitamin D  (CHOLECALCIFEROL) 25 MCG (1000 UT) tablet Take 1 tablet (1,000 Units) by mouth daily      vitamin D , ergocalciferol , (DRISDOL ) 50000 UNIT Cap Take 1 capsule (50,000 Units) by mouth once a week 4 capsule 5     No current facility-administered medications for this visit.   [3]   Allergies  Allergen Reactions    Bee Pollen Swelling     Bee Stings.SABRASABRASABRAStuffy nose; throat swelling      Vicodin [Hydrocodone-Acetaminophen ] Nausea And Vomiting   [4]   Past Medical History:  Diagnosis Date    Anemia     Arthritis     Carotid stenosis     Complication of anesthesia     Diarrhea 12/2016    Since I had my dual switch surgery    Fatty liver 2017    GERD (gastroesophageal reflux disease)     Hepatic cirrhosis (CMS/HCC)     Hiatal hernia     Hyperthyroidism 2013    Thyroid  removed    Sickle-cell anemia (CMS/HCC)     Sleep apnea     not using CPAP machine    Vitamin D  deficiency    [5]   Past Surgical History:  Procedure Laterality Date    CESAREAN SECTION  1992    CHOLECYSTECTOMY  12/2016    COLONOSCOPY, WITH BIOPSY N/A 01/27/2018  Procedure: COLONOSCOPY, BIOPSY;  Surgeon: Marie Mara Pringle, MD;  Location: [facility];  Service: Gastroenterology;  Laterality: N/A;    DUODENAL SWITCH  12/2016    EGD, BIOPSY N/A 01/27/2018    Procedure: EGD, BIOPSY;  Surgeon: Marie Mara Pringle, MD;  Location: [facility];  Service: Gastroenterology;  Laterality: N/A;    GASTRECTOMY  09/2013    GASTRIC BYPASS  11/2016    HYSTERECTOMY  2015    Partial.    LAPAROSCOPIC REVISION SLEEVE GASTRECTOMY TO GASTRIC BYPASS      LAPAROSCOPIC, GASTRECTOMY SLEEVE      SALPINGO OOPHERECTOMY Bilateral      THYROIDECTOMY, TOTAL  2013   [6]   Family History  Problem Relation Name Age of Onset    Cancer Mother EDINA WINNINGHAM         hx ovarian    Hypertension Mother MARCELLENE SHIVLEY     Inflammatory bowel disease Mother ANNETTA DEISS     Heart failure Mother SHALAINE PAYSON     Angina Mother JANA SWARTZLANDER     Migraines Mother Baker JONETTA Daring     Asthma Father Father     Diabetes Father Father     Hyperlipidemia Father Father     Hypertension Father Father     Cancer Father Father         Currently has cancer    Crohn's disease Father Father     Stroke Father Father     Asthma Sister Lekeshia Kram     Diabetes Sister Sylvanna Burggraf     Hyperlipidemia Sister Alesia Oshields     Hypertension Sister Amand Lemoine     Early death Sister Phala Schraeder     Cancer Brother Elspeth Daring         hx of stomach    Hyperlipidemia Brother Annarose Ouellet     Hypertension Brother Vibha Ferdig     Migraines Brother Johnasia Liese     Learning disabilities Brother Kashlynn Kundert         Deaf    Heart attack Brother Glorian Mcdonell     Breast cancer Paternal Aunt          died of     Breast cancer Other cousin         maternal cousin-- survior    Obesity Son ALTA GODING    [7]   Social History  Socioeconomic History    Marital status: Significant Other   Tobacco Use    Smoking status: Never    Smokeless tobacco: Never    Tobacco comments:     None   Vaping Use    Vaping status: Never Used   Substance and Sexual Activity    Alcohol use: Yes     Comment: Wine occasionally    Drug use: Not Currently    Sexual activity: Not Currently     Partners: Male     Birth control/protection: Post-menopausal, Rhythm   Other Topics Concern    Dietary supplements / vitamins Yes     Comment: Biotin B12 multi vitamins D3    Anesthesia problems No    Blood thinners No    Pregnant No    Future Children No    Number of Pregnancies? No    Number of children Yes     Comment: 1    Miscarriages / Abortions? No    Eats large amounts No    Excessive Sweets No  Skips meals No    Eats excessive  starches No    Snacks or grazes No    Emotional eater No    Eats fried food No    Eats fast food No    Diet Center Yes    Randall Banks No    LA Weight Loss No    Nutri-System No    Opti-Fast / Medi-Fast Yes    Overeaters Anonymous No    [facility] Yes    TOPS No    Weight Watchers Yes    Atkins Yes    Binging / Purging No    Calorie Counting Yes    Fasting Yes    High Protein Yes    Low Carb Yes    Low Fat No    [facility] Diet No    Slim Fast Yes    [place] No    Stationary cycle or treadmill Yes    Gym/fitness Classes Yes    Home exercise/video Yes    Swimming Yes    Weight training Yes    Walking or running Yes    Hospitalization No    Hypnosis No    Physical therapy Yes    Psychological therapy Yes    Residential program No    Acutrim No    Byetta No    Contrave No    Dexatrim Yes    Diethylpropion No    Fastin No    Fen - Phen Yes    Ionamin / Adipex No    Phentermine Yes    Qsymia No    Prozac Yes    Saxenda No    Topamax No    Wellbutrin No    Xenical (Orlistat, Alli) No    Other Med No    No impairment Yes    Walks with cane/crutch Yes    Requires a wheelchair No    Bedridden No    Are you currently being treated for depression? No    Do you snore? No    Are you receiving any medical or psychological services? No    Do you ever wake up at night gasping for breath? No    Do you have or have you been treated for an eating disorder? No    Anyone ever told you that you stop breathing while asleep? No    Do you exercise regularly? Yes    Have you or family member ever have trouble with anesthesia? Yes     Social Drivers of Health     Financial Resource Strain: Patient Declined (08/26/2022)    Overall Financial Resource Strain (CARDIA)     Difficulty of Paying Living Expenses: Patient declined   Recent Concern: Financial Resource Strain - Medium Risk (06/16/2022)    Overall Financial Resource Strain (CARDIA)     Difficulty of Paying Living Expenses: Somewhat hard   Food Insecurity: Unknown  (08/26/2022)    Hunger Vital Sign     Worried About Running Out of Food in the Last Year: Never true     Ran Out of Food in the Last Year: Patient declined   Transportation Needs: Patient Declined (08/26/2022)    PRAPARE - Therapist, art (Medical): Patient declined     Lack of Transportation (Non-Medical): Patient declined   Physical Activity: Unknown (08/26/2022)    Exercise Vital Sign     Days of Exercise per Week: Patient declined     Minutes of  Exercise per Session: 50 min   Stress: Patient Declined (08/26/2022)    [employer] of Occupational Health - Occupational Stress Questionnaire     Feeling of Stress : Patient declined   Recent Concern: Stress - Stress Concern Present (06/16/2022)    [employer] [employer] of Occupational Health - Occupational Stress Questionnaire     Feeling of Stress : To some extent   Social Connections: Unknown (08/26/2022)    Social Connection and Isolation Panel [NHANES]     Frequency of Communication with Friends and Family: Patient declined     Frequency of Social Gatherings with Friends and Family: Once a week     Attends Religious Services: More than 4 times per year     Active Member of [employer] or Organizations: Patient declined     Attends Banker Meetings: Patient declined     Marital Status: Patient declined   Intimate Partner Violence: Patient Declined (08/26/2022)    Humiliation, Afraid, Rape, and Kick questionnaire     Fear of Current or Ex-Partner: Patient declined     Emotionally Abused: Patient declined     Physically Abused: Patient declined     Sexually Abused: Patient declined   Housing Stability: Patient Declined (08/26/2022)    Housing Stability Vital Sign     Unable to Pay for Housing in the Last Year: Patient declined     Homeless in the Last Year: Patient declined   Recent Concern: Housing Stability - High Risk (06/16/2022)    Housing Stability Vital Sign     Unable to Pay for Housing in the Last Year: Yes     Number of Places Lived  in the Last Year: 1     Unstable Housing in the Last Year: No

## 2023-06-05 ENCOUNTER — Encounter (INDEPENDENT_AMBULATORY_CARE_PROVIDER_SITE_OTHER): Payer: Self-pay | Admitting: "Endocrinology

## 2023-06-05 DIAGNOSIS — E890 Postprocedural hypothyroidism: Secondary | ICD-10-CM

## 2023-08-04 ENCOUNTER — Other Ambulatory Visit (INDEPENDENT_AMBULATORY_CARE_PROVIDER_SITE_OTHER): Payer: Self-pay | Admitting: "Endocrinology

## 2023-08-04 NOTE — Telephone Encounter (Signed)
 Last Visit:  04/09/23    ICD:   E89.0    Due for f/u:  04/08/2024    F/u appt: None scheduled     Last lab:         05/04/23  Component      Latest Ref Rng 05/04/2023   T3 Free      2.0 - 4.4 pg/mL 3.0    T4 Free      0.82 - 1.77 ng/dL 9.32 (L)    TSH      9.549 - 4.500 uIU/mL 9.730 (H)       Legend:  (L) Low  (H) High

## 2023-08-05 ENCOUNTER — Telehealth: Payer: Self-pay | Admitting: "Endocrinology

## 2023-08-05 ENCOUNTER — Other Ambulatory Visit (INDEPENDENT_AMBULATORY_CARE_PROVIDER_SITE_OTHER): Payer: Self-pay | Admitting: "Endocrinology

## 2023-08-05 DIAGNOSIS — E890 Postprocedural hypothyroidism: Secondary | ICD-10-CM

## 2023-08-05 MED ORDER — ARMOUR THYROID 30 MG PO TABS
30.0000 mg | ORAL_TABLET | Freq: Every day | ORAL | 0 refills | Status: DC
Start: 2023-08-05 — End: 2023-09-02

## 2023-08-05 NOTE — Telephone Encounter (Signed)
 Copied from CRM [id number]. Topic: Clinical Support - Speak With Nurse  >> Aug 05, 2023  1:35 PM Jazmin C wrote:  [name], [name] [facility] called about Clinical Support - Speak With Nurse.  Additional details:  >> Aug 05, 2023  1:41 PM Jazmin C wrote:  Patient is calling in wanting to know why her prescription for  Armour Thyroid  30 MG tablet was denied. Looked into TE but there Is not indication on to why script was denied by provider.  Pt is upset because her prescription was denied without telling her why it has been denied    Pt states she has been out of medication for 2 days.

## 2023-08-05 NOTE — Telephone Encounter (Addendum)
 Called and spoke to patient re: her Armour thyroid  30 mg and lab order.  Advised from Dr. Ardelle, she needs to have labs done d/t Armour increase 75mg  to 90mg . Check TFTs in 1 month per her conversation on April 29th.    Informed patient per Dr. Ardelle, patient was overdue for labs in May after I had increased her medication in April due to high TSH   advise patient that labs need to be checked after a dosage change to ensure that that is the correct dosage. I can send in a few months supply and once she is back on the medication for a month she can do the labs. The orders have are in her chart     Patient stated she had labs done in June. I checked [facility] to see if TFTs were included in the labs, and they were not.  I explained to her that the provider placed her thyroid  lab order on May 25, but she stated that I did not see those in my chart nor received an email about the lab order was placed! saying all of this in a raised voice almost in a yelling matter. Patient raised her voice quite a few times during the conversation - she also kept saying that I do not understand why my labs that was done in June not include my thyroid  labs from Dr. Ardelle, why the lab did not include them when the order is there and kept saying that I do not see my lab order that was placed in May!    Handed the phone over to LPN Dorien to help with patient to further discuss the matter.    Spoke with patient, patient states she ran out of meds over the weekend and has not taken it for a couple of days. Nurse informed patient she may pick up her prescription for Armour Thyroid  30mg  today. Per Dr. Ardelle,  instructed patient to have her labs drawn one month after resuming her thyroid  medication. Patient verbalized understanding and states she has marked her calendar for August 26,2025 to get her labs drawn at [facility] location.- F.Cox,LPN

## 2023-08-18 ENCOUNTER — Other Ambulatory Visit: Payer: Self-pay | Admitting: Family Medicine

## 2023-08-19 ENCOUNTER — Ambulatory Visit (INDEPENDENT_AMBULATORY_CARE_PROVIDER_SITE_OTHER): Payer: Self-pay | Admitting: Family Medicine

## 2023-09-02 ENCOUNTER — Other Ambulatory Visit (FREE_STANDING_LABORATORY_FACILITY)

## 2023-09-02 ENCOUNTER — Other Ambulatory Visit (INDEPENDENT_AMBULATORY_CARE_PROVIDER_SITE_OTHER): Payer: Self-pay | Admitting: "Endocrinology

## 2023-09-02 DIAGNOSIS — E890 Postprocedural hypothyroidism: Secondary | ICD-10-CM

## 2023-09-02 LAB — T4, FREE: T4 Free: 0.91 ng/dL (ref 0.69–1.48)

## 2023-09-02 LAB — T3, FREE: T3 Free: 3.4 pg/mL (ref 1.71–3.71)

## 2023-09-02 LAB — TSH: TSH: 0.08 u[IU]/mL — ABNORMAL LOW (ref 0.35–4.94)

## 2023-09-02 NOTE — Telephone Encounter (Signed)
 Last OV: 04/09/23  Next OV: none  Last labs: 05/04/23    Please review pended rx, thanks!

## 2023-09-03 ENCOUNTER — Ambulatory Visit (INDEPENDENT_AMBULATORY_CARE_PROVIDER_SITE_OTHER): Payer: Self-pay | Admitting: "Endocrinology

## 2023-09-03 ENCOUNTER — Other Ambulatory Visit (INDEPENDENT_AMBULATORY_CARE_PROVIDER_SITE_OTHER): Payer: Self-pay | Admitting: "Endocrinology

## 2023-09-03 DIAGNOSIS — E890 Postprocedural hypothyroidism: Secondary | ICD-10-CM

## 2023-10-04 ENCOUNTER — Encounter (INDEPENDENT_AMBULATORY_CARE_PROVIDER_SITE_OTHER): Payer: Self-pay | Admitting: "Endocrinology

## 2023-10-04 DIAGNOSIS — E890 Postprocedural hypothyroidism: Secondary | ICD-10-CM

## 2023-10-05 ENCOUNTER — Other Ambulatory Visit (INDEPENDENT_AMBULATORY_CARE_PROVIDER_SITE_OTHER): Payer: Self-pay | Admitting: "Endocrinology

## 2023-10-05 DIAGNOSIS — E890 Postprocedural hypothyroidism: Secondary | ICD-10-CM

## 2023-10-05 NOTE — Telephone Encounter (Signed)
 LOV - 04/09/23    Next OV - None    Labs - 09/02/23

## 2023-10-06 ENCOUNTER — Other Ambulatory Visit (INDEPENDENT_AMBULATORY_CARE_PROVIDER_SITE_OTHER): Payer: Self-pay | Admitting: "Endocrinology

## 2023-10-06 DIAGNOSIS — E890 Postprocedural hypothyroidism: Secondary | ICD-10-CM

## 2023-10-07 NOTE — Telephone Encounter (Signed)
 Last Visit:  04/09/23    ICD:   E89.0    Due for f/u:  04/08/24    F/u appt: None scheduled. [facility] sent    Last lab: Sent message reminding she's due    Component      Latest Ref Rng 09/02/2023   TSH      0.35 - 4.94 uIU/mL 0.08 (L)    T4 Free      0.69 - 1.48 ng/dL 9.08    T3 Free      8.28 - 3.71 pg/mL 3.40       Legend:  (L) Low    Please sign pended orders if appropriate.

## 2023-10-09 ENCOUNTER — Other Ambulatory Visit (INDEPENDENT_AMBULATORY_CARE_PROVIDER_SITE_OTHER): Payer: Self-pay | Admitting: Family Medicine

## 2023-11-13 ENCOUNTER — Other Ambulatory Visit (INDEPENDENT_AMBULATORY_CARE_PROVIDER_SITE_OTHER): Payer: Self-pay | Admitting: "Endocrinology

## 2023-11-13 DIAGNOSIS — E890 Postprocedural hypothyroidism: Secondary | ICD-10-CM

## 2023-11-13 MED ORDER — ARMOUR THYROID 60 MG PO TABS: 60.0000 mg | ORAL_TABLET | Freq: Every day | ORAL | 0 refills | Status: DC

## 2023-11-13 NOTE — Telephone Encounter (Signed)
 Last OV: 04/09/2023     Next visit: None  Last labs: 09/02/2023     Advised to see provider in a year. Reminder has been send to patient to schedule the follow up as per provider's busy schedule     Please review if the pended prescription is appropriate. Thank you

## 2023-11-13 NOTE — Telephone Encounter (Addendum)
 Copied from CRM [id number]. Topic: Clinical Support - Prescription Refill  >> Nov 13, 2023 12:23 PM Chaleque F wrote:  Name, strength, directions of requested refill(s):    Armour Thyroid  60 MG tablet   Take 1 tablet (60 mg) by mouth daily     How much medication is remaining: None    Pharmacy to send refill to or patient to pick up rx from office (mark requested pharmacy in BOLD):      [facility] [address] [place] - [address]  [address]  [place] [address]  Phone: [phone] Fax: [phone]    [facility] [address] [place], [place] - [address]  [address]  [place] [address]  Phone: [phone] Fax: [phone]        Please mark X next to the preferred call back number:    Mobile: [phone] (mobile) X   Home: [phone] (home)    Work: @WORKPHONE @        Medication refill request, see above. Thank you   Patient has been informed that medication refill requests should be called in up to one week prior to running out of medication.    Additional Notes: The pt is traveling in [facility], and forgot her thyroid  medication. The pharmacy is requesting an emergency 7 day supply to hold the pt until returning home.    Next Visit: TBD

## 2023-11-13 NOTE — Addendum Note (Signed)
 Addended by: KERNEY SETTERS on: 11/13/2023 04:21 PM     Modules accepted: Orders

## 2023-12-07 ENCOUNTER — Other Ambulatory Visit: Payer: Self-pay | Admitting: "Endocrinology

## 2023-12-07 DIAGNOSIS — E890 Postprocedural hypothyroidism: Secondary | ICD-10-CM

## 2023-12-07 MED ORDER — ARMOUR THYROID 60 MG PO TABS: 60.0000 mg | ORAL_TABLET | Freq: Every day | ORAL | 1 refills | Status: AC

## 2023-12-07 NOTE — Telephone Encounter (Signed)
 Last OV: 04/09/23  Next OV: 03/21/24  Last labs: 09/02/23    Please review pended rx, thanks!

## 2023-12-07 NOTE — Telephone Encounter (Signed)
 Copied from CRM [id number]. Topic: Appointment Scheduling - Schedule Appointment  >> Dec 07, 2023 11:03 AM Verneita NOVAK wrote:  DELORES STANK [facility] called about Appointment Scheduling - Schedule Appointment.  Additional details:  >> Dec 07, 2023 11:06 AM Verneita B wrote:  Name, strength, directions of requested refill(s):  Armour Thyroid  60 MG tablet (Order [id number])     How much medication is remaining: none    Pharmacy to send refill to or patient to pick up rx from office (mark requested pharmacy in BOLD):      [facility] [address], [place] - [address]  [address]  [place] [address]  Phone: [phone] Fax: [phone]    [facility] [address] [place], [place] - [address]  [address]  [place] [address]  Phone: [phone] Fax: [phone]        Please mark X next to the preferred call back number:    Mobile: [phone] (mobile) x   Home: [phone] (home)    Work: @WORKPHONE @        Medication refill request, see above. Thank you   Patient has been informed that medication refill requests should be called in up to one week prior to running out of medication.    Additional Notes:    Next Visit: 03/21/2024

## 2023-12-07 NOTE — Telephone Encounter (Signed)
 Rx refill(s) sent.

## 2024-02-05 ENCOUNTER — Other Ambulatory Visit (INDEPENDENT_AMBULATORY_CARE_PROVIDER_SITE_OTHER): Payer: Self-pay | Admitting: "Endocrinology

## 2024-02-05 DIAGNOSIS — E890 Postprocedural hypothyroidism: Secondary | ICD-10-CM

## 2024-02-26 ENCOUNTER — Ambulatory Visit (INDEPENDENT_AMBULATORY_CARE_PROVIDER_SITE_OTHER): Admitting: "Endocrinology

## 2024-03-21 ENCOUNTER — Ambulatory Visit (INDEPENDENT_AMBULATORY_CARE_PROVIDER_SITE_OTHER): Admitting: "Endocrinology
